# Patient Record
Sex: Female | Born: 1955 | Race: White | Hispanic: No | Marital: Married | Smoking: Former smoker
Health system: Southern US, Community
[De-identification: ages and names within clinical notes are randomized; demographics above are authoritative.]

## PROBLEM LIST (undated history)

## (undated) DIAGNOSIS — I10 Essential (primary) hypertension: Secondary | ICD-10-CM

## (undated) DIAGNOSIS — R059 Cough, unspecified: Secondary | ICD-10-CM

## (undated) DIAGNOSIS — J45909 Unspecified asthma, uncomplicated: Secondary | ICD-10-CM

## (undated) DIAGNOSIS — R0600 Dyspnea, unspecified: Secondary | ICD-10-CM

## (undated) DIAGNOSIS — C3490 Malignant neoplasm of unspecified part of unspecified bronchus or lung: Secondary | ICD-10-CM

## (undated) DIAGNOSIS — F419 Anxiety disorder, unspecified: Secondary | ICD-10-CM

## (undated) DIAGNOSIS — C801 Malignant (primary) neoplasm, unspecified: Secondary | ICD-10-CM

## (undated) DIAGNOSIS — K219 Gastro-esophageal reflux disease without esophagitis: Secondary | ICD-10-CM

## (undated) HISTORY — PX: THORACENTESIS: SHX235

## (undated) HISTORY — PX: BUNIONECTOMY: SHX129

## (undated) HISTORY — PX: COLONOSCOPY: SHX174

---

## 2004-08-13 ENCOUNTER — Ambulatory Visit: Payer: Self-pay | Admitting: Internal Medicine

## 2005-08-17 ENCOUNTER — Ambulatory Visit: Payer: Self-pay | Admitting: Internal Medicine

## 2006-03-17 ENCOUNTER — Ambulatory Visit: Payer: Self-pay | Admitting: Unknown Physician Specialty

## 2006-04-01 ENCOUNTER — Ambulatory Visit: Payer: Self-pay | Admitting: Unknown Physician Specialty

## 2006-04-01 HISTORY — PX: ESOPHAGOGASTRODUODENOSCOPY: SHX1529

## 2006-04-03 ENCOUNTER — Inpatient Hospital Stay: Payer: Self-pay | Admitting: Unknown Physician Specialty

## 2006-07-15 ENCOUNTER — Ambulatory Visit: Payer: Self-pay | Admitting: Unknown Physician Specialty

## 2007-04-18 ENCOUNTER — Ambulatory Visit: Payer: Self-pay | Admitting: Specialist

## 2007-08-25 ENCOUNTER — Ambulatory Visit: Payer: Self-pay | Admitting: Internal Medicine

## 2009-06-23 ENCOUNTER — Ambulatory Visit: Payer: Self-pay | Admitting: Internal Medicine

## 2009-06-25 ENCOUNTER — Ambulatory Visit: Payer: Self-pay | Admitting: Internal Medicine

## 2009-09-05 ENCOUNTER — Ambulatory Visit: Payer: Self-pay | Admitting: Unknown Physician Specialty

## 2010-05-12 ENCOUNTER — Ambulatory Visit: Payer: Self-pay | Admitting: Internal Medicine

## 2010-05-25 ENCOUNTER — Ambulatory Visit: Payer: Self-pay | Admitting: Internal Medicine

## 2011-08-19 ENCOUNTER — Ambulatory Visit: Payer: Self-pay | Admitting: Internal Medicine

## 2012-08-24 ENCOUNTER — Ambulatory Visit: Payer: Self-pay | Admitting: Internal Medicine

## 2013-08-28 ENCOUNTER — Ambulatory Visit: Payer: Self-pay | Admitting: Internal Medicine

## 2013-12-13 DIAGNOSIS — M519 Unspecified thoracic, thoracolumbar and lumbosacral intervertebral disc disorder: Secondary | ICD-10-CM | POA: Insufficient documentation

## 2014-08-29 DIAGNOSIS — J45909 Unspecified asthma, uncomplicated: Secondary | ICD-10-CM | POA: Insufficient documentation

## 2014-09-11 ENCOUNTER — Ambulatory Visit: Payer: Self-pay | Admitting: Internal Medicine

## 2015-09-02 ENCOUNTER — Other Ambulatory Visit: Payer: Self-pay | Admitting: Internal Medicine

## 2015-09-02 DIAGNOSIS — Z1231 Encounter for screening mammogram for malignant neoplasm of breast: Secondary | ICD-10-CM

## 2015-09-17 ENCOUNTER — Ambulatory Visit
Admission: RE | Admit: 2015-09-17 | Discharge: 2015-09-17 | Disposition: A | Discharge status: Home / Self Care | Payer: BLUE CROSS/BLUE SHIELD | Source: Ambulatory Visit | Attending: Internal Medicine | Admitting: Internal Medicine

## 2015-09-17 DIAGNOSIS — Z1231 Encounter for screening mammogram for malignant neoplasm of breast: Secondary | ICD-10-CM | POA: Diagnosis present

## 2016-07-05 DIAGNOSIS — I10 Essential (primary) hypertension: Secondary | ICD-10-CM | POA: Insufficient documentation

## 2016-09-07 DIAGNOSIS — E538 Deficiency of other specified B group vitamins: Secondary | ICD-10-CM | POA: Insufficient documentation

## 2017-06-03 ENCOUNTER — Other Ambulatory Visit: Payer: Self-pay | Admitting: Internal Medicine

## 2017-06-06 ENCOUNTER — Other Ambulatory Visit: Payer: Self-pay | Admitting: Internal Medicine

## 2017-06-06 DIAGNOSIS — Z1231 Encounter for screening mammogram for malignant neoplasm of breast: Secondary | ICD-10-CM

## 2017-06-16 ENCOUNTER — Ambulatory Visit
Admission: RE | Admit: 2017-06-16 | Discharge: 2017-06-16 | Disposition: A | Discharge status: Home / Self Care | Payer: BLUE CROSS/BLUE SHIELD | Source: Ambulatory Visit | Attending: Internal Medicine | Admitting: Internal Medicine

## 2017-06-16 DIAGNOSIS — Z1231 Encounter for screening mammogram for malignant neoplasm of breast: Secondary | ICD-10-CM | POA: Insufficient documentation

## 2018-06-13 ENCOUNTER — Other Ambulatory Visit: Payer: Self-pay | Admitting: Internal Medicine

## 2018-06-13 DIAGNOSIS — Z1231 Encounter for screening mammogram for malignant neoplasm of breast: Secondary | ICD-10-CM

## 2018-06-21 ENCOUNTER — Ambulatory Visit
Admission: RE | Admit: 2018-06-21 | Discharge: 2018-06-21 | Disposition: A | Discharge status: Home / Self Care | Payer: BLUE CROSS/BLUE SHIELD | Source: Ambulatory Visit | Attending: Internal Medicine | Admitting: Internal Medicine

## 2018-06-21 DIAGNOSIS — Z1231 Encounter for screening mammogram for malignant neoplasm of breast: Secondary | ICD-10-CM | POA: Diagnosis present

## 2018-09-11 DIAGNOSIS — Z800 Family history of malignant neoplasm of digestive organs: Secondary | ICD-10-CM | POA: Insufficient documentation

## 2018-09-11 DIAGNOSIS — D369 Benign neoplasm, unspecified site: Secondary | ICD-10-CM | POA: Insufficient documentation

## 2019-05-22 ENCOUNTER — Other Ambulatory Visit: Payer: Self-pay | Admitting: Internal Medicine

## 2019-05-22 DIAGNOSIS — Z1231 Encounter for screening mammogram for malignant neoplasm of breast: Secondary | ICD-10-CM

## 2019-06-25 ENCOUNTER — Ambulatory Visit
Admission: RE | Admit: 2019-06-25 | Discharge: 2019-06-25 | Disposition: A | Discharge status: Home / Self Care | Payer: BC Managed Care – PPO | Source: Ambulatory Visit | Attending: Internal Medicine | Admitting: Internal Medicine

## 2019-06-25 DIAGNOSIS — Z1231 Encounter for screening mammogram for malignant neoplasm of breast: Secondary | ICD-10-CM | POA: Diagnosis present

## 2019-10-18 DIAGNOSIS — K219 Gastro-esophageal reflux disease without esophagitis: Secondary | ICD-10-CM | POA: Insufficient documentation

## 2020-01-16 DIAGNOSIS — E871 Hypo-osmolality and hyponatremia: Secondary | ICD-10-CM | POA: Insufficient documentation

## 2020-06-16 ENCOUNTER — Other Ambulatory Visit: Payer: Self-pay | Admitting: Internal Medicine

## 2020-09-17 ENCOUNTER — Other Ambulatory Visit: Payer: Self-pay | Admitting: Internal Medicine

## 2020-09-17 ENCOUNTER — Other Ambulatory Visit (HOSPITAL_COMMUNITY): Payer: Self-pay | Admitting: Internal Medicine

## 2020-09-17 DIAGNOSIS — R918 Other nonspecific abnormal finding of lung field: Secondary | ICD-10-CM

## 2020-10-02 ENCOUNTER — Other Ambulatory Visit: Payer: Self-pay

## 2020-10-02 ENCOUNTER — Ambulatory Visit
Admission: RE | Admit: 2020-10-02 | Discharge: 2020-10-02 | Disposition: A | Discharge status: Home / Self Care | Payer: BC Managed Care – PPO | Source: Ambulatory Visit | Attending: Internal Medicine | Admitting: Internal Medicine

## 2020-10-02 ENCOUNTER — Ambulatory Visit: Payer: BC Managed Care – PPO

## 2020-10-02 DIAGNOSIS — R918 Other nonspecific abnormal finding of lung field: Secondary | ICD-10-CM | POA: Diagnosis present

## 2020-10-02 HISTORY — DX: Essential (primary) hypertension: I10

## 2020-10-02 HISTORY — DX: Unspecified asthma, uncomplicated: J45.909

## 2020-10-02 MED ORDER — IOHEXOL 300 MG/ML  SOLN
75.0000 mL | Freq: Once | INTRAMUSCULAR | Status: AC | PRN
  Administered 2020-10-02: 75 mL via INTRAVENOUS

## 2020-10-03 ENCOUNTER — Other Ambulatory Visit: Payer: Self-pay | Admitting: Physician Assistant

## 2020-10-03 DIAGNOSIS — R918 Other nonspecific abnormal finding of lung field: Secondary | ICD-10-CM

## 2020-10-07 LAB — PULMONARY FUNCTION TEST

## 2020-10-16 ENCOUNTER — Ambulatory Visit
Admission: RE | Admit: 2020-10-16 | Discharge: 2020-10-16 | Disposition: A | Discharge status: Home / Self Care | Payer: BC Managed Care – PPO | Source: Ambulatory Visit | Attending: Physician Assistant | Admitting: Physician Assistant

## 2020-10-16 ENCOUNTER — Other Ambulatory Visit: Payer: Self-pay

## 2020-10-16 DIAGNOSIS — R918 Other nonspecific abnormal finding of lung field: Secondary | ICD-10-CM

## 2020-10-16 LAB — GLUCOSE, CAPILLARY: Glucose-Capillary: 86 mg/dL (ref 70–99)

## 2020-10-16 MED ORDER — FLUDEOXYGLUCOSE F - 18 (FDG) INJECTION
8.3000 | Freq: Once | INTRAVENOUS | Status: AC | PRN
  Administered 2020-10-16: 8.68 via INTRAVENOUS

## 2020-10-28 LAB — PULMONARY FUNCTION TEST

## 2020-10-31 ENCOUNTER — Inpatient Hospital Stay: Admission: RE | Admit: 2020-10-31 | Payer: BC Managed Care – PPO | Source: Ambulatory Visit

## 2020-11-04 ENCOUNTER — Other Ambulatory Visit: Payer: Self-pay | Admitting: Pulmonary Disease

## 2020-11-04 DIAGNOSIS — R918 Other nonspecific abnormal finding of lung field: Secondary | ICD-10-CM

## 2020-11-05 ENCOUNTER — Ambulatory Visit
Admission: RE | Admit: 2020-11-05 | Discharge: 2020-11-05 | Disposition: A | Discharge status: Home / Self Care | Payer: BC Managed Care – PPO | Source: Ambulatory Visit | Attending: Pulmonary Disease | Admitting: Pulmonary Disease

## 2020-11-05 ENCOUNTER — Other Ambulatory Visit: Payer: Self-pay

## 2020-11-05 ENCOUNTER — Other Ambulatory Visit
Admission: RE | Admit: 2020-11-05 | Discharge: 2020-11-05 | Disposition: A | Discharge status: Home / Self Care | Payer: BC Managed Care – PPO | Source: Ambulatory Visit | Attending: Pulmonary Disease | Admitting: Pulmonary Disease

## 2020-11-05 DIAGNOSIS — Z01812 Encounter for preprocedural laboratory examination: Secondary | ICD-10-CM | POA: Insufficient documentation

## 2020-11-05 DIAGNOSIS — Z87891 Personal history of nicotine dependence: Secondary | ICD-10-CM | POA: Diagnosis not present

## 2020-11-05 DIAGNOSIS — Z20822 Contact with and (suspected) exposure to covid-19: Secondary | ICD-10-CM | POA: Insufficient documentation

## 2020-11-05 DIAGNOSIS — Z79899 Other long term (current) drug therapy: Secondary | ICD-10-CM | POA: Diagnosis not present

## 2020-11-05 DIAGNOSIS — R918 Other nonspecific abnormal finding of lung field: Secondary | ICD-10-CM | POA: Diagnosis not present

## 2020-11-05 DIAGNOSIS — R590 Localized enlarged lymph nodes: Secondary | ICD-10-CM | POA: Diagnosis not present

## 2020-11-05 DIAGNOSIS — C3431 Malignant neoplasm of lower lobe, right bronchus or lung: Secondary | ICD-10-CM | POA: Diagnosis not present

## 2020-11-06 ENCOUNTER — Encounter
Admission: RE | Admit: 2020-11-06 | Discharge: 2020-11-06 | Disposition: A | Discharge status: Home / Self Care | Payer: BC Managed Care – PPO | Source: Ambulatory Visit | Attending: Pulmonary Disease | Admitting: Pulmonary Disease

## 2020-11-06 HISTORY — DX: Gastro-esophageal reflux disease without esophagitis: K21.9

## 2020-11-06 HISTORY — DX: Cough, unspecified: R05.9

## 2020-11-06 HISTORY — DX: Anxiety disorder, unspecified: F41.9

## 2020-11-06 HISTORY — DX: Dyspnea, unspecified: R06.00

## 2020-11-06 LAB — SARS CORONAVIRUS 2 (TAT 6-24 HRS): SARS Coronavirus 2: NEGATIVE

## 2020-11-06 NOTE — Patient Instructions (Signed)
Your procedure is scheduled on:11-07-20 FRIDAY Report to the Registration Desk on the 1st floor of the [facility]-Then proceed to the 2nd floor Surgery Desk in the [place] To find out your arrival time, please call [phone] between 1PM - 3PM on:11-06-20 THURSDAY  REMEMBER: Instructions that are not followed completely may result in serious medical risk, up to and including death; or upon the discretion of your surgeon and anesthesiologist your surgery may need to be rescheduled.  Do not eat food after midnight the night before surgery.  No gum chewing, lozengers or hard candies.  You may however, drink CLEAR liquids up to 2 hours before you are scheduled to arrive for your surgery. Do not drink anything within 2 hours of your scheduled arrival time.  Clear liquids include: - water  - apple juice without pulp - gatorade (not RED, PURPLE, OR BLUE) - black coffee or tea (Do NOT add milk or creamers to the coffee or tea) Do NOT drink anything that is not on this list.  TAKE THESE MEDICATIONS THE MORNING OF SURGERY WITH A SIP OF WATER: -METOPROLOL (TOPROL) -NEXIUM (ESOMEPRAZOLE)-take one the night before and one on the morning of surgery - helps to prevent nausea after surgery.) -YOU MAY TAKE XANAX IF NEEDED THE DAY OF SURGERY  One week prior to surgery: Stop Anti-inflammatories (NSAIDS) such as Advil, Aleve, Ibuprofen, Motrin, Naproxen, Naprosyn and Aspirin based products such as Excedrin, Goodys Powder, BC Powder-OK TO TAKE TYLENOL IF NEEDED  No Alcohol for 24 hours before or after surgery.  No Smoking including e-cigarettes for 24 hours prior to surgery.  No chewable tobacco products for at least 6 hours prior to surgery.  No nicotine patches on the day of surgery.  Do not use any "recreational" drugs for at least a week prior to your surgery.  Please be advised that the combination of cocaine and anesthesia may have negative outcomes, up to and including death. If you test  positive for cocaine, your surgery will be cancelled.  On the morning of surgery brush your teeth with toothpaste and water, you may rinse your mouth with mouthwash if you wish. Do not swallow any toothpaste or mouthwash.  Do not wear jewelry, make-up, hairpins, clips or nail polish.  Do not wear lotions, powders, or perfumes.   Do not shave body from the neck down 48 hours prior to surgery just in case you cut yourself which could leave a site for infection.  Also, freshly shaved skin may become irritated if using the CHG soap.  Contact lenses, hearing aids and dentures may not be worn into surgery.  Do not bring valuables to the hospital. [facility] is not responsible for any missing/lost belongings or valuables.   Notify your doctor if there is any change in your medical condition (cold, fever, infection).  Wear comfortable clothing (specific to your surgery type) to the hospital.  Plan for stool softeners for home use; pain medications have a tendency to cause constipation. You can also help prevent constipation by eating foods high in fiber such as fruits and vegetables and drinking plenty of fluids as your diet allows.  After surgery, you can help prevent lung complications by doing breathing exercises.  Take deep breaths and cough every 1-2 hours. Your doctor may order a device called an Incentive Spirometer to help you take deep breaths. When coughing or sneezing, hold a pillow firmly against your incision with both hands. This is called "splinting." Doing this helps protect your  incision. It also decreases belly discomfort.  If you are being admitted to the hospital overnight, leave your suitcase in the car. After surgery it may be brought to your room.  If you are being discharged the day of surgery, you will not be allowed to drive home. You will need a responsible adult (18 years or older) to drive you home and stay with you that night.   If you are taking public  transportation, you will need to have a responsible adult (18 years or older) with you. Please confirm with your physician that it is acceptable to use public transportation.   Please call the [facility] at [phone] if you have any questions about these instructions.  Surgery Visitation Policy:  Patients undergoing a surgery or procedure may have one family member or support person with them as long as that person is not COVID-19 positive or experiencing its symptoms.  That person may remain in the waiting area during the procedure.  Inpatient Visitation:    Visiting hours are 7 a.m. to 8 p.m. Inpatients will be allowed two visitors daily. The visitors may change each day during the patient's stay. No visitors under the age of 14. Any visitor under the age of 12 must be accompanied by an adult. The visitor must pass COVID-19 screenings, use hand sanitizer when entering and exiting the patient's room and wear a mask at all times, including in the patient's room. Patients must also wear a mask when staff or their visitor are in the room. Masking is required regardless of vaccination status.

## 2020-11-07 ENCOUNTER — Ambulatory Visit: Payer: BC Managed Care – PPO | Admitting: Certified Registered Nurse Anesthetist

## 2020-11-07 ENCOUNTER — Ambulatory Visit: Payer: BC Managed Care – PPO

## 2020-11-07 ENCOUNTER — Encounter
Admission: RE | Disposition: A | Discharge status: Home / Self Care | Payer: Self-pay | Source: Home / Self Care | Attending: Pulmonary Disease

## 2020-11-07 ENCOUNTER — Ambulatory Visit
Admission: RE | Admit: 2020-11-07 | Discharge: 2020-11-07 | Disposition: A | Discharge status: Home / Self Care | Payer: BC Managed Care – PPO | Attending: Pulmonary Disease | Admitting: Pulmonary Disease

## 2020-11-07 DIAGNOSIS — R590 Localized enlarged lymph nodes: Secondary | ICD-10-CM | POA: Insufficient documentation

## 2020-11-07 DIAGNOSIS — Z79899 Other long term (current) drug therapy: Secondary | ICD-10-CM | POA: Insufficient documentation

## 2020-11-07 DIAGNOSIS — C3431 Malignant neoplasm of lower lobe, right bronchus or lung: Secondary | ICD-10-CM | POA: Diagnosis not present

## 2020-11-07 DIAGNOSIS — Z87891 Personal history of nicotine dependence: Secondary | ICD-10-CM | POA: Insufficient documentation

## 2020-11-07 DIAGNOSIS — Z20822 Contact with and (suspected) exposure to covid-19: Secondary | ICD-10-CM | POA: Insufficient documentation

## 2020-11-07 HISTORY — PX: VIDEO BRONCHOSCOPY WITH ENDOBRONCHIAL NAVIGATION: SHX6175

## 2020-11-07 HISTORY — PX: VIDEO BRONCHOSCOPY WITH ENDOBRONCHIAL ULTRASOUND: SHX6177

## 2020-11-07 LAB — PROTIME-INR
INR: 1 (ref 0.8–1.2)
Prothrombin Time: 12.4 seconds (ref 11.4–15.2)

## 2020-11-07 LAB — CBC
HCT: 34.7 % — ABNORMAL LOW (ref 36.0–46.0)
Hemoglobin: 11.6 g/dL — ABNORMAL LOW (ref 12.0–15.0)
MCH: 32.4 pg (ref 26.0–34.0)
MCHC: 33.4 g/dL (ref 30.0–36.0)
MCV: 96.9 fL (ref 80.0–100.0)
Platelets: 295 10*3/uL (ref 150–400)
RBC: 3.58 MIL/uL — ABNORMAL LOW (ref 3.87–5.11)
RDW: 12.9 % (ref 11.5–15.5)
WBC: 7 10*3/uL (ref 4.0–10.5)
nRBC: 0 % (ref 0.0–0.2)

## 2020-11-07 LAB — APTT: aPTT: 29 seconds (ref 24–36)

## 2020-11-07 SURGERY — BRONCHOSCOPY, WITH EBUS
Anesthesia: General

## 2020-11-07 MED ORDER — LIDOCAINE HCL (PF) 2 % IJ SOLN
INTRAMUSCULAR | Status: AC
  Filled 2020-11-07: qty 5

## 2020-11-07 MED ORDER — MEPERIDINE HCL 50 MG/ML IJ SOLN: 6.2500 mg | INTRAMUSCULAR | Status: DC | PRN

## 2020-11-07 MED ORDER — FENTANYL CITRATE (PF) 100 MCG/2ML IJ SOLN: 25.0000 ug | INTRAMUSCULAR | Status: DC | PRN

## 2020-11-07 MED ORDER — LIDOCAINE HCL (CARDIAC) PF 100 MG/5ML IV SOSY
PREFILLED_SYRINGE | INTRAVENOUS | Status: DC | PRN
  Administered 2020-11-07: 100 mg via INTRAVENOUS

## 2020-11-07 MED ORDER — DEXAMETHASONE SODIUM PHOSPHATE 10 MG/ML IJ SOLN
INTRAMUSCULAR | Status: AC
  Filled 2020-11-07: qty 1

## 2020-11-07 MED ORDER — PHENYLEPHRINE HCL (PRESSORS) 10 MG/ML IV SOLN
INTRAVENOUS | Status: DC | PRN
Start: 2020-11-07 — End: 2020-11-10
  Administered 2020-11-07: 100 ug via INTRAVENOUS
  Administered 2020-11-07: 150 ug via INTRAVENOUS
  Administered 2020-11-07: 100 ug via INTRAVENOUS
  Administered 2020-11-07: 150 ug via INTRAVENOUS
  Administered 2020-11-07: 100 ug via INTRAVENOUS
  Administered 2020-11-07: 150 ug via INTRAVENOUS
  Administered 2020-11-07 (×2): 100 ug via INTRAVENOUS

## 2020-11-07 MED ORDER — OXYCODONE HCL 5 MG PO TABS: 5.0000 mg | ORAL_TABLET | Freq: Once | ORAL | Status: DC | PRN

## 2020-11-07 MED ORDER — FENTANYL CITRATE (PF) 100 MCG/2ML IJ SOLN
INTRAMUSCULAR | Status: AC
  Filled 2020-11-07: qty 2

## 2020-11-07 MED ORDER — OXYCODONE HCL 5 MG/5ML PO SOLN: 5.0000 mg | Freq: Once | ORAL | Status: DC | PRN

## 2020-11-07 MED ORDER — BUTAMBEN-TETRACAINE-BENZOCAINE 2-2-14 % EX AERO
1.0000 | INHALATION_SPRAY | Freq: Once | CUTANEOUS | Status: DC
  Filled 2020-11-07: qty 20

## 2020-11-07 MED ORDER — CHLORHEXIDINE GLUCONATE 0.12 % MT SOLN
OROMUCOSAL | Status: AC
  Filled 2020-11-07: qty 15

## 2020-11-07 MED ORDER — ONDANSETRON HCL 4 MG/2ML IJ SOLN
INTRAMUSCULAR | Status: DC | PRN
  Administered 2020-11-07: 4 mg via INTRAVENOUS

## 2020-11-07 MED ORDER — ONDANSETRON HCL 4 MG/2ML IJ SOLN
INTRAMUSCULAR | Status: AC
  Filled 2020-11-07: qty 2

## 2020-11-07 MED ORDER — LIDOCAINE HCL (PF) 1 % IJ SOLN
30.0000 mL | Freq: Once | INTRAMUSCULAR | Status: DC
  Filled 2020-11-07: qty 30

## 2020-11-07 MED ORDER — PHENYLEPHRINE HCL 0.25 % NA SOLN
1.0000 | Freq: Four times a day (QID) | NASAL | Status: DC | PRN
  Filled 2020-11-07: qty 15

## 2020-11-07 MED ORDER — MIDAZOLAM HCL 2 MG/2ML IJ SOLN
INTRAMUSCULAR | Status: AC
  Filled 2020-11-07: qty 2

## 2020-11-07 MED ORDER — ROCURONIUM BROMIDE 100 MG/10ML IV SOLN
INTRAVENOUS | Status: DC | PRN
  Administered 2020-11-07: 40 mg via INTRAVENOUS
  Administered 2020-11-07 (×2): 10 mg via INTRAVENOUS

## 2020-11-07 MED ORDER — ORAL CARE MOUTH RINSE: 15.0000 mL | Freq: Once | OROMUCOSAL | Status: AC

## 2020-11-07 MED ORDER — LACTATED RINGERS IV SOLN: INTRAVENOUS | Status: DC

## 2020-11-07 MED ORDER — PROPOFOL 10 MG/ML IV BOLUS
INTRAVENOUS | Status: AC
  Filled 2020-11-07: qty 20

## 2020-11-07 MED ORDER — CHLORHEXIDINE GLUCONATE 0.12 % MT SOLN
15.0000 mL | Freq: Once | OROMUCOSAL | Status: AC
  Administered 2020-11-07: 15 mL via OROMUCOSAL

## 2020-11-07 MED ORDER — FENTANYL CITRATE (PF) 100 MCG/2ML IJ SOLN
INTRAMUSCULAR | Status: DC | PRN
  Administered 2020-11-07 (×4): 50 ug via INTRAVENOUS

## 2020-11-07 MED ORDER — MIDAZOLAM HCL 2 MG/2ML IJ SOLN
INTRAMUSCULAR | Status: DC | PRN
  Administered 2020-11-07: 2 mg via INTRAVENOUS

## 2020-11-07 MED ORDER — PROMETHAZINE HCL 25 MG/ML IJ SOLN: 6.2500 mg | INTRAMUSCULAR | Status: DC | PRN

## 2020-11-07 MED ORDER — PROPOFOL 10 MG/ML IV BOLUS
INTRAVENOUS | Status: DC | PRN
  Administered 2020-11-07: 60 mg via INTRAVENOUS
  Administered 2020-11-07: 140 mg via INTRAVENOUS

## 2020-11-07 MED ORDER — LIDOCAINE HCL URETHRAL/MUCOSAL 2 % EX GEL
1.0000 "application " | Freq: Once | CUTANEOUS | Status: DC
  Filled 2020-11-07: qty 5

## 2020-11-07 MED ORDER — DEXAMETHASONE SODIUM PHOSPHATE 10 MG/ML IJ SOLN
INTRAMUSCULAR | Status: DC | PRN
  Administered 2020-11-07: 10 mg via INTRAVENOUS

## 2020-11-07 MED ORDER — SUGAMMADEX SODIUM 200 MG/2ML IV SOLN
INTRAVENOUS | Status: DC | PRN
  Administered 2020-11-07: 145.2 mg via INTRAVENOUS

## 2020-11-07 MED ORDER — ROCURONIUM BROMIDE 10 MG/ML (PF) SYRINGE
PREFILLED_SYRINGE | INTRAVENOUS | Status: AC
  Filled 2020-11-07: qty 10

## 2020-11-07 NOTE — Discharge Instructions (Addendum)
Flexible Bronchoscopy  Flexible bronchoscopy is a procedure used to examine the passageways in the lungs. During the procedure, a thin, flexible tool with a camera (bronchoscope) is passed into the mouth or nose, down through the windpipe (trachea), and into the air tubes in the lungs (bronchi). This tool allows the health care provider to look inside the lungs and to take samples for testing, if needed. Tell a health care provider about:  Any allergies you have.  All medicines you are taking, including vitamins, herbs, eye drops, creams, and over-the-counter medicines.  Any problems you or family members have had with anesthetic medicines.  Any blood disorders you have.  Any surgeries you have had.  Any medical conditions you have.  Whether you are pregnant or may be pregnant. What are the risks? Generally, this is a safe procedure. However, problems may occur, including:  Infection.  Bleeding.  Damage to other structures or organs.  Allergic reactions to medicines.  Collapsed lung (pneumothorax).  Increased need for oxygen or difficulty breathing after the procedure. What happens before the procedure? Staying hydrated Follow instructions from your health care provider about hydration, which may include:  Up to 2 hours before the procedure - you may continue to drink clear liquids, such as water, clear fruit juice, black coffee, and plain tea.   Eating and drinking restrictions Follow instructions from your health care provider about eating and drinking, which may include:  8 hours before the procedure - stop eating heavy meals or foods, such as meat, fried foods, or fatty foods.  6 hours before the procedure - stop eating light meals or foods, such as toast or cereal.  6 hours before the procedure - stop drinking milk or drinks that contain milk.  2 hours before the procedure - stop drinking clear liquids. Medicines Ask your health care provider about:  Changing or  stopping your regular medicines. This is especially important if you are taking diabetes medicines or blood thinners.  Taking medicines such as aspirin and ibuprofen. These medicines can thin your blood. Do not take these medicines unless your health care provider tells you to take them.  Taking over-the-counter medicines, vitamins, herbs, and supplements. General instructions  You may be given antibiotic medicine to help lower the risk of infection.  Plan to have a responsible adult take you home from the hospital or clinic.  If you will be going home right after the procedure, plan to have a responsible adult care for you for the time you are told. This is important. What happens during the procedure?  An IV will be inserted into one of your veins.  You will be given a medicine (local anesthetic) to numb your mouth, nose, throat, and voice box (larynx). You may also be given one or more of the following: ? A medicine to help you relax (sedative). ? A medicine to control coughing. ? A medicine to dry up any fluids or secretions in your lungs.  A bronchoscope will be passed into your nose or mouth, and into your lungs. Your health care provider will examine your lungs.  Samples of airway secretions may be collected for testing.  If abnormal areas are seen in your airways, samples of tissue may be removed and checked under a microscope (biopsy).  If tissue samples are needed from the outer parts of the lung, a type of X-ray (fluoroscopy) may be used to guide the bronchoscope to these areas.  If bleeding occurs, you may be given medicine to  stop or decrease the bleeding. The procedure may vary among health care providers and hospitals. What can I expect after the procedure?  Your blood pressure, heart rate, breathing rate, and blood oxygen level will be monitored until you leave the hospital or clinic.  You may have a chest X-ray to check for signs of pneumothorax.  You willnot be  allowed to eat or drink anything for 2 hours after your procedure.  If a biopsy was taken, it is up to you to get the results of the test. Ask your health care provider, or the department that is doing the procedure, when your results will be ready.  You may have the following symptoms for 24-48 hours: ? A cough that is worse than it was before the procedure. ? A low-grade fever. ? A sore throat or hoarse voice. ? Some blood in the mucus from your lungs (sputum), if a biopsy was done. Follow these instructions at home: Eating and drinking  Do not eat or drink anything, including water, for 2 hours after your procedure, or until your numbing medicine has worn off. Having a numb throat increases your risk of burning yourself or choking.  Start eating soft foods and slowly drinking liquids after your numbness is gone and your cough and gag reflexes have returned.  You may return to your normal diet the day after the procedure. Driving  If you were given a sedative during the procedure, it can affect you for several hours. Do not drive or operate machinery until your health care provider says that it is safe.  Ask your health care provider if the medicine prescribed to you requires you to avoid driving or using machinery.  Return to your normal activities as told by your health care provider. Ask your health care provider what activities are safe for you. General instructions  Take over-the-counter and prescription medicines only as told by your health care provider.  Do not use any products that contain nicotine or tobacco. These products include cigarettes, chewing tobacco, and vaping devices, such as e-cigarettes. If you need help quitting, ask your health care provider.  Keep all follow-up visits. This is important.   Get help right away if:  You have shortness of breath that gets worse.  You become light-headed or feel like you might faint.  You have chest pain.  You cough up  more than a small amount of blood. These symptoms may represent a serious problem that is an emergency. Do not wait to see if the symptoms will go away. Get medical help right away. Call your local emergency services (911 in the [place] Do not drive yourself to the hospital. Summary  Flexible bronchoscopy is a procedure that allows your health care provider to look closely inside your lungs and to take testing samples if needed.  Risks of flexible bronchoscopy include bleeding, infection, and collapsed lung (pneumothorax).  Before the procedure, you will be given a medicine to numb your mouth, nose, throat, and voice box. Then, a bronchoscope will be passed into your nose or mouth, and into your lungs.  After the procedure, your blood pressure, heart rate, breathing rate, and blood oxygen level will be monitored until you leave the hospital or clinic. You may have a chest X-ray to check for signs of pneumothorax.  You will not be allowed to eat or drink anything for 2 hours after your procedure. This information is not intended to replace advice given to you by your health care provider.  Make sure you discuss any questions you have with your health care provider. Document Revised: 02/07/2020 Document Reviewed: 02/07/2020 Elsevier Patient Education  2021 [place]   1) The drugs that you were given will stay in your system until tomorrow so for the next 24 hours you should not:  A) Drive an automobile B) Make any legal decisions C) Drink any alcoholic beverage   2) You may resume regular meals tomorrow.  Today it is better to start with liquids and gradually work up to solid foods.  You may eat anything you prefer, but it is better to start with liquids, then soup and crackers, and gradually work up to solid foods.   3) Please notify your doctor immediately if you have any unusual bleeding, trouble breathing, redness and pain at  the surgery site, drainage, fever, or pain not relieved by medication.  4) Your post-operative visit with Dr.                                     is: Date:                        Time:    Please call to schedule your post-operative visit.  5) Additional Instructions:  You may resume your daily medications tomorrow.

## 2020-11-07 NOTE — H&P (Signed)
Pulmonary Medicine          Date: 11/07/2020,   MRN# [id number] Katherine Perry 04-Jun-1956     AdmissionWeight: 72.6 kg                 CurrentWeight: 72.6 kg     CHIEF COMPLAINT:   Right upper lobe lung mass with hilar and mediastinal lymphadenopathy.   HISTORY OF PRESENT ILLNESS   Pleasant 65 year old patient with a history of asthma, dyspnea, reflux with GERD, chronic cough and anxiety disorder, she initially came in with worsening cough for evaluation in pulmonary clinic.  She had CT chest done which showed lung consolidation of the right upper and middle lobe.  She had interval repeat CT chest with similar findings.  She has smoked in the past from age [age].  CT chest shows right upper lobe consolidated mass with hilar lymphadenopathy and mediastinal lymphadenopathy suggestive of possible malignant or infectious etiology.  Patient presents today for bronchoscopy with airway inspection as well as navigational bronchoscopy and lymph node biopsies via EBUS.  She denies any new symptoms.   PAST MEDICAL HISTORY   Past Medical History:  Diagnosis Date  . Anxiety   . Asthma   . Cough   . Dyspnea    with exertion  . GERD (gastroesophageal reflux disease)   . Hypertension      SURGICAL HISTORY   Past Surgical History:  Procedure Laterality Date  . BUNIONECTOMY Bilateral   . COLONOSCOPY       FAMILY HISTORY   History reviewed. No pertinent family history.   SOCIAL HISTORY   Social History   Tobacco Use  . Smoking status: Former Smoker    Packs/day: 1.00    Years: 15.00    Pack years: 15.00    Types: Cigarettes    Quit date: 11/07/1994    Years since quitting: 26.0  . Smokeless tobacco: Never Used  Vaping Use  . Vaping Use: Never used  Substance Use Topics  . Alcohol use: Yes    Comment:  wine daily  . Drug use: Never     MEDICATIONS    Home Medication:    Current Medication:  Current Facility-Administered Medications:  .   chlorhexidine (PERIDEX) 0.12 % solution, , , ,  .  lactated ringers infusion, , Intravenous, Continuous, Piscitello, Precious Haws, MD, Last Rate: 10 mL/hr at 11/07/20 1118, New Bag at 11/07/20 1118    ALLERGIES   Patient has no known allergies.     REVIEW OF SYSTEMS    Review of Systems:  Gen:  Denies  fever, sweats, chills weigh loss  HEENT: Denies blurred vision, double vision, ear pain, eye pain, hearing loss, nose bleeds, sore throat Cardiac:  No dizziness, chest pain or heaviness, chest tightness,edema Resp:   Denies cough or sputum porduction, shortness of breath,wheezing, hemoptysis,  Gi: Denies swallowing difficulty, stomach pain, nausea or vomiting, diarrhea, constipation, bowel incontinence Gu:  Denies bladder incontinence, burning urine Ext:   Denies Joint pain, stiffness or swelling Skin: Denies  skin rash, easy bruising or bleeding or hives Endoc:  Denies polyuria, polydipsia , polyphagia or weight change Psych:   Denies depression, insomnia or hallucinations   Other:  All other systems negative   VS: BP (!) 156/78   Pulse 89   Temp (!) 97.3 F (36.3 C) (Temporal)   Resp 12   Ht 5\' 5"  (1.651 m)   Wt 72.6 kg   SpO2 100%   BMI 26.63  kg/m      PHYSICAL EXAM    GENERAL:NAD, no fevers, chills, no weakness no fatigue HEAD: Normocephalic, atraumatic.  EYES: Pupils equal, round, reactive to light. Extraocular muscles intact. No scleral icterus.  MOUTH: Moist mucosal membrane. Dentition intact. No abscess noted.  EAR, NOSE, THROAT: Clear without exudates. No external lesions.  NECK: Supple. No thyromegaly. No nodules. No JVD.  PULMONARY: Diffuse coarse rhonchi right sided +wheezes CARDIOVASCULAR: S1 and S2. Regular rate and rhythm. No murmurs, rubs, or gallops. No edema. Pedal pulses 2+ bilaterally.  GASTROINTESTINAL: Soft, nontender, nondistended. No masses. Positive bowel sounds. No hepatosplenomegaly.  MUSCULOSKELETAL: No swelling, clubbing, or edema. Range  of motion full in all extremities.  NEUROLOGIC: Cranial nerves II through XII are intact. No gross focal neurological deficits. Sensation intact. Reflexes intact.  SKIN: No ulceration, lesions, rashes, or cyanosis. Skin warm and dry. Turgor intact.  PSYCHIATRIC: Mood, affect within normal limits. The patient is awake, alert and oriented x 3. Insight, judgment intact.       IMAGING    NM PET Image Initial (PI) Skull Base To Thigh  Result Date: 10/16/2020 CLINICAL DATA:  Initial treatment strategy for RIGHT lower lobe lung mass. EXAM: NUCLEAR MEDICINE PET SKULL BASE TO THIGH TECHNIQUE: 8.7 mCi F-18 FDG was injected intravenously. Full-ring PET imaging was performed from the skull base to thigh after the radiotracer. CT data was obtained and used for attenuation correction and anatomic localization. Fasting blood glucose: 86 mg/dl COMPARISON:  CT 10/02/2020 FINDINGS: Mediastinal blood pool activity: SUV max 2.7 Liver activity: SUV max NA NECK: Consolidation within the medial aspect of the RIGHT lower lobe extending from the hilum the lung base is similar morphology in short interval follow-up measuring 3.4 by 4.0 cm in axial dimension compared to 3.3 by 4.1 cm on prior. This consolidation is intensely hypermetabolic with SUV max equal 11.6. There is hypermetabolic masslike thickening at the RIGHT hilum measuring 2.2 cm (image 94) also with intense metabolic activity. The ground-glass airspace disease/consolidation in the posterior aspect of the RIGHT upper lobe is unchanged morphology and also has intense metabolic activity SUV max equal 11.4. No hypermetabolic mediastinal lymph nodes. No hypermetabolic supraclavicular nodes. Incidental CT findings: none CHEST: Incidental CT findings: none ABDOMEN/PELVIS: No abnormal hypermetabolic activity within the liver, pancreas, adrenal glands, or spleen. No hypermetabolic lymph nodes in the abdomen or pelvis. Incidental CT findings: None SKELETON: No focal  hypermetabolic activity to suggest skeletal metastasis. Incidental CT findings: none IMPRESSION: 1. Hypermetabolic consolidative mass in the medial aspect of the RIGHT lower lobe extending from the hilum to the lung base. Hypermetabolic LEFT hilar mass. Findings remain highly concerning for bronchogenic carcinoma although pulmonary infection could have similar pattern. No significant change from CT 10/02/2020. 2. Band intensely hypermetabolic ground-glass density in the RIGHT upper lobe with differential including pulmonary infection versus lymphangitic spread of carcinoma. 3. Consider bronchoscopy for evaluation of the RIGHT lower lobe pulmonary mass. 4. No hypermetabolic mediastinal lymph nodes or distant malignancy. Electronically Signed   By: Suzy Bouchard M.D.   On: 10/16/2020 14:33   CT Super D Chest Wo Contrast  Result Date: 11/06/2020 CLINICAL DATA:  Preparation for navigational bronchoscopy. EXAM: CT CHEST WITHOUT CONTRAST TECHNIQUE: Multidetector CT imaging of the chest was performed using thin slice collimation for electromagnetic bronchoscopy planning purposes, without intravenous contrast. COMPARISON:  October 02, 2020 and prior PET scan from October 16, 2020 FINDINGS: Cardiovascular: Calcified atheromatous plaque the thoracic aorta. Nonaneurysmal appearance of the thoracic aorta. Normal heart size.  Mass/abnormal soft tissue abutting the posterior aspect of the RIGHT border of the heart, posterior LEFT atrium to the RIGHT of midline. Central pulmonary vasculature is normal caliber. Mediastinum/Nodes: Esophagus patulous and inseparable from the masslike area in the medial RIGHT chest on image 90 of series 2. Subcarinal nodal enlargement with similar appearance to recent chest CT, largest approximately 10 mm. RIGHT paratracheal lymph nodes with mild enlargement similar to prior imaging. Small anterior mediastinal lymph node on image 55 of series 2 unchanged is 6 mm. No supraclavicular adenopathy. No  axillary adenopathy. Lungs/Pleura: Large masslike area in the medial RIGHT chest and RIGHT hilar mass with associated distortion of the major fissure, dominant area centered in the superior segment of the RIGHT lower lobe but also involving RIGHT hilum and with extension of ground-glass, bronchiectasis and septal thickening with interstitial thickening into the RIGHT upper lobe and into the RIGHT lower lobe as on previous imaging. Dominant area in axial dimension measuring 6.6 by 3.2 cm as compared to 7.2 x 2.7 cm. Just below the RIGHT hilum the area measures approximately 5.1 x 5.0 cm, little changed compared to previous imaging Upper lobe findings may be slightly smaller and or less confluent measuring approximately 9 by 4.8 cm as compared to 8.7 x 5.9 cm on the previous exam LEFT lung is clear. Upper Abdomen: Incidental imaging of upper abdominal contents without acute process. Imaged portions of liver, gallbladder, pancreas, spleen, adrenal glands and kidneys are unremarkable. No acute gastrointestinal process to the extent evaluated. Musculoskeletal: Spinal degenerative changes. No acute or destructive bone process. IMPRESSION: 1. Masslike area in the superior segment of the RIGHT lower lobe, medial RIGHT middle lobe and with extension of septal thickening and ground-glass into the RIGHT upper lobe, associated with fissural distortion, little changed aside from slightly less septal thickening and ground-glass in the RIGHT upper lobe compared to previous imaging. 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 2. Top-normal size of mediastinal lymph nodes, refer to prior PET scan little change from prior imaging. Aortic Atherosclerosis (ICD10-I70.0). Electronically Signed   By: Zetta Bills M.D.   On: 11/06/2020 14:48      ASSESSMENT/PLAN    Right upper and middle lobe consolidated mass with hilar and mediastinal lymphadenopathy -Patient presents today for tissue sampling via bronchoscopy as well as navigational bronchoscopy and endobronchial ultrasound assisted lymph node biopsies.     -Reviewed risks/complications and benefits with patient, risks include infection, pneumothorax/pneumomediastinum which may require chest tube placement as well as overnight/prolonged hospitalization and possible mechanical ventilation. Other risks include bleeding and very rarely death.  Patient understands risks and wishes to proceed.  Additional questions were answered, and patient is aware that post procedure patient will be going home with family and may experience cough with possible clots on expectoration as well as phlegm which may last few days as well as hoarseness of voice post intubation and mechanical ventilation.     Thank you for allowing me to participate in the care of this patient.   Patient/Family are satisfied with care plan and all questions have been answered.  This document was prepared using Dragon voice recognition software and may include unintentional dictation errors.     Ottie Glazier, M.D.  Division of [place]

## 2020-11-07 NOTE — Anesthesia Procedure Notes (Addendum)
Procedure Name: Intubation Performed by: Demetrius Charity, CRNA Pre-anesthesia Checklist: Patient identified, Patient being monitored, Timeout performed, Emergency Drugs available and Suction available Patient Re-evaluated:Patient Re-evaluated prior to induction Oxygen Delivery Method: Circle system utilized Preoxygenation: Pre-oxygenation with 100% oxygen Induction Type: IV induction Ventilation: Mask ventilation without difficulty Laryngoscope Size: 3 and McGraph Grade View: Grade III Tube type: Oral Tube size: 9.0 mm Number of attempts: 1 Airway Equipment and Method: Stylet Placement Confirmation: ETT inserted through vocal cords under direct vision,  positive ETCO2 and breath sounds checked- equal and bilateral Secured at: 21 cm Tube secured with: Tape Dental Injury: Teeth and Oropharynx as per pre-operative assessment  Difficulty Due To: Difficulty was anticipated and Difficult Airway- due to anterior larynx

## 2020-11-07 NOTE — Anesthesia Postprocedure Evaluation (Signed)
Anesthesia Post Note  Patient: Katherine Perry  Procedure(s) Performed: VIDEO BRONCHOSCOPY WITH ENDOBRONCHIAL ULTRASOUND (N/A ) VIDEO BRONCHOSCOPY WITH ENDOBRONCHIAL NAVIGATION (N/A )  Patient location during evaluation: PACU Anesthesia Type: General Level of consciousness: awake and alert Pain management: pain level controlled Vital Signs Assessment: post-procedure vital signs reviewed and stable Respiratory status: spontaneous breathing, nonlabored ventilation, respiratory function stable and patient connected to nasal cannula oxygen Cardiovascular status: blood pressure returned to baseline and stable Postop Assessment: no apparent nausea or vomiting Anesthetic complications: no   No complications documented.   Last Vitals:  Vitals:   11/07/20 1456 11/07/20 1515  BP: (!) 166/74 (!) 163/65  Pulse: 85 77  Resp: 17 20  Temp: (!) 36.2 C 36.8 C  SpO2: 99% 96%    Last Pain:  Vitals:   11/07/20 1515  TempSrc: Temporal  PainSc: 0-No pain                 Martha Clan

## 2020-11-07 NOTE — Anesthesia Preprocedure Evaluation (Signed)
Anesthesia Evaluation  Patient identified by MRN, date of birth, ID band Patient awake    Reviewed: Allergy & Precautions, NPO status , Patient's Chart, lab work & pertinent test results  History of Anesthesia Complications Negative for: history of anesthetic complications  Airway Mallampati: II  TM Distance: >3 FB Neck ROM: Full    Dental no notable dental hx.    Pulmonary asthma , former smoker,    breath sounds clear to auscultation- rhonchi (-) wheezing      Cardiovascular Exercise Tolerance: Good hypertension, Pt. on medications (-) CAD, (-) Past MI, (-) Cardiac Stents and (-) CABG  Rhythm:Regular Rate:Normal - Systolic murmurs and - Diastolic murmurs    Neuro/Psych neg Seizures PSYCHIATRIC DISORDERS Anxiety negative neurological ROS     GI/Hepatic Neg liver ROS, GERD  ,  Endo/Other  negative endocrine ROSneg diabetes  Renal/GU negative Renal ROS     Musculoskeletal negative musculoskeletal ROS (+)   Abdominal (+) - obese,   Peds  Hematology negative hematology ROS (+)   Anesthesia Other Findings Past Medical History: No date: Anxiety No date: Asthma No date: Cough No date: Dyspnea     Comment:  with exertion No date: GERD (gastroesophageal reflux disease) No date: Hypertension   Reproductive/Obstetrics                             Anesthesia Physical Anesthesia Plan  ASA: II  Anesthesia Plan: General   Post-op Pain Management:    Induction: Intravenous  PONV Risk Score and Plan: 2 and Ondansetron, Dexamethasone and Midazolam  Airway Management Planned: Oral ETT  Additional Equipment:   Intra-op Plan:   Post-operative Plan: Extubation in OR  Informed Consent: I have reviewed the patients History and Physical, chart, labs and discussed the procedure including the risks, benefits and alternatives for the proposed anesthesia with the patient or authorized  representative who has indicated his/her understanding and acceptance.     Dental advisory given  Plan Discussed with: CRNA and Anesthesiologist  Anesthesia Plan Comments:         Anesthesia Quick Evaluation

## 2020-11-07 NOTE — Procedures (Signed)
ELECTROMAGNETIC NAVIGATIONAL BRONCHOSCOPY PROCEDURE NOTE  FIBEROPTIC BRONCHOSCOPY WITH BRONCHOALVEOLAR LAVAGE PROCEDURE NOTE  ENDOBRONCHIAL ULTRASOUND PROCEDURE NOTE    Flexible bronchoscopy was performed  by : Katherine Gins MD  assistance by : 1)Repiratory therapist  and 2)[employer] staff and 3) Anesthesia team and 4) Flouroscopy team and 5) [employer] supporting staff   Indication for the procedure was :  Pre-procedural H&P. The following assessment was performed on the day of the procedure prior to initiating sedation History:  Chest pain n Dyspnea y Hemoptysis n Cough y Fever n Other pertinent items n  Examination Vital signs -reviewed as per nursing documentation today Cardiac    Murmurs: n  Rubs : n  Gallop: n Lungs Wheezing: n Rales : n Rhonchi :y  Other pertinent findings: SOB/hypoxemia due to chronic lung disease   Pre-procedural assessment for Procedural Sedation included: Depth of sedation: As per anesthesia team  ASA Classification:  2 Mallampati airway assessment: 3    Medication list reviewed: y  The patient's interval history was taken and revealed: no new complaints The pre- procedure physical examination revealed: No new findings Refer to prior clinic note for details.  Informed Consent: Informed consent was obtained from:  patient after explanation of procedure and risks, benefits, as well as alternative procedures available.  Explanation of level of sedation and possible transfusion was also provided.    Procedural Preparation: Time out was performed and patient was identified by name and birthdate and procedure to be performed and side for sampling, if any, was specified. Pt was intubated by anesthesia.  The patient was appropriately draped.   Fiberoptic bronchoscopy with airway inspection and BAL Procedure findings:   Media Information       Media Information                    Bronchoscope was inserted  via ETT  without difficulty.  Posterior oropharynx, epiglottis, arytenoids, false cords and vocal cords were not visualized as these were bypassed by endotracheal tube. The distal trachea was normal in circumference and appearance without mucosal, cartilaginous or branching abnormalities.  The main carina was mildly splayed . All right and left lobar airways were visualized to the Subsegmental level.  Sub- sub segmental carinae were identified in all the distal airways.   Secretions were visible in the following airways and appeared to be clear.  The mucosa was : friable at RUL  Airways were notable for:        exophytic lesions :n       extrinsic compression in the following distributions: n.       Friable mucosa: y       Neurosurgeon /pigmentation: n     Post procedure Diagnosis:                           MULTIPLE SEGMENTS WITH COMPLETE COLLAPSE                           MUCUS PLUGGING  OF BILATERAL AIRWAYS                    SEVERE EDEMA OF RIGHT SIDED AIRWAYS IN ALL 3 LOBES     Electromagnetic Navigational Bronchoscopy Procedure Findings:  After appropriate CT-guided planning ENB scope was advanced via endotracheal tube and LG was advanced for registration.  Post appropriate planning and registration peripheral navigation was used to visualize target lesion.  Right lower lobe lesion - BAL x 3, Cytobrush x 3, microbursh x 2, surgical pathology x 3   Right upper lobe - BAL x2, cytobrush x 2, microbrush x 2, surgical pathology x 2   Post procedure diagnosis:   Acute inflammatory changes suggestive of infection      Endobronchial ultrasound assisted hilar and mediastinal lymph node biopsies procedure findings: The fiberoptic bronchoscope was removed and the EBUS scope was introduced. Examination began to evaluate for pathologically enlarged lymph nodes starting on the Left  side progressing to the Right side.  All lymph node biopsies performed with 21g needle. Lymph  node biopsies were sent in cytolite for all stations.   Post procedure diagnosis:  - Reactive lymphadenopathy  Station 4L - 22m - not biopsied Staion 7 - 1.7cm biopsied 4 times staion 4R - 1.1cm biopsied 3 times staion 11R- >1cm with infiltate/mass - biopsied 4 times   Specimens obtained included:                           Microbiology brushes: RUL, RLL; sent for cyto and micro                     Cytology brushes : RUL and RLL - sent for cyto and micro  Broncho-alveolar lavage site:RLL and RUL   sent for cyto and micro                              931mvolume infused 50 ml volume returned with serosang/blood with cellular debri appearance    Fluoroscopy Used: yes ;        Pictorial documentation attached: above                   Immediate sampling complications included:none  Epinephrine zero ml was used topically  The bronchoscopy was terminated due to completion of the planned procedure and the bronchoscope was removed.   Total dosage of Lidocaine was zero mg Total fluoroscopy time was 1.1  minutes   Estimated Blood loss: expected 5 cc.  Complications included:  none immediate   Preliminary CXR findings :  n  Disposition: home , I spoke with [name] procedure to review prelim findings  Follow up with Dr. AlLanney Ginsn 5-10 days for result discussion.     FuOttie GlazierD  [facility]

## 2020-11-07 NOTE — Progress Notes (Signed)
Patient ambulated while on pulse ox. Sats remained 97% with ambulation on room air.

## 2020-11-08 LAB — BODY FLUID CELL COUNT WITH DIFFERENTIAL
Eos, Fluid: 5 %
Lymphs, Fluid: 54 %
Monocyte-Macrophage-Serous Fluid: 19 % — ABNORMAL LOW (ref 50–90)
Neutrophil Count, Fluid: 22 % (ref 0–25)
Total Nucleated Cell Count, Fluid: 47 cu mm (ref 0–1000)

## 2020-11-09 LAB — ACID FAST SMEAR (AFB, MYCOBACTERIA): Acid Fast Smear: NEGATIVE

## 2020-11-10 ENCOUNTER — Encounter: Payer: Self-pay | Admitting: *Deleted

## 2020-11-10 ENCOUNTER — Encounter: Payer: Self-pay | Admitting: Pulmonary Disease

## 2020-11-10 LAB — CYTOLOGY - NON PAP

## 2020-11-10 NOTE — Transfer of Care (Signed)
Immediate Anesthesia Transfer of Care Note  Patient: Katherine Perry  Procedure(s) Performed: VIDEO BRONCHOSCOPY WITH ENDOBRONCHIAL ULTRASOUND (N/A ) VIDEO BRONCHOSCOPY WITH ENDOBRONCHIAL NAVIGATION (N/A )  Patient Location: PACU  Anesthesia Type:General  Level of Consciousness: drowsy  Airway & Oxygen Therapy: Patient Spontanous Breathing and Patient connected to face mask oxygen  Post-op Assessment: Report given to RN and Post -op Vital signs reviewed and stable  Post vital signs: Reviewed and stable  Last Vitals:  Vitals Value Taken Time  BP 176/75 11/07/20 1542  Temp    Pulse 84 11/07/20 1542  Resp 18 11/07/20 1542  SpO2 97 % 11/07/20 1542    Last Pain:  Vitals:   11/10/20 0821  TempSrc:   PainSc: 0-No pain         Complications: No complications documented.

## 2020-11-10 NOTE — Progress Notes (Signed)
  Oncology Nurse Navigator Documentation  Navigator Location: [facility] (11/10/20 1000) Referral Date to RadOnc/MedOnc: 11/10/20 (11/10/20 1000) )Navigator Encounter Type: Initial MedOnc (11/10/20 1000)   Abnormal Finding Date: 10/03/20 (11/10/20 1000) Confirmed Diagnosis Date: 11/10/20 (11/10/20 1000)                 Treatment Phase: Pre-Tx/Tx Discussion (11/10/20 1000) Barriers/Navigation Needs: Coordination of Care (11/10/20 1000)   Interventions: Coordination of Care;Referrals (11/10/20 1000) Referrals: Radiation Oncology (11/10/20 1000) Coordination of Care: Appts (11/10/20 1000)         referral received today and assisted pt with coordinating care. Pt scheduled for new patient visit with Dr. Janese Banks on Fri 4/15 at 66am. Consult with Dr. Baruch Gouty scheduled for Mon 4/18 at 11am. Pt made aware of preliminary results at this time and informed that results will be finalized this week and discussed with her at her appt on Friday. All questions answered during call. Reviewed all upcoming appts. Contact info given and instructed to call with any further questions or needs. Pt verbalized understanding. Nothing further needed at this time.         Time Spent with Patient: 30 (11/10/20 1000)

## 2020-11-11 LAB — CULTURE, BAL-QUANTITATIVE W GRAM STAIN: Culture: 80000 — AB

## 2020-11-11 LAB — SURGICAL PATHOLOGY

## 2020-11-12 LAB — ASPERGILLUS ANTIGEN, BAL/SERUM: Aspergillus Ag, BAL/Serum: 0.21 {index} (ref 0.00–0.49)

## 2020-11-14 ENCOUNTER — Other Ambulatory Visit: Payer: Self-pay

## 2020-11-14 ENCOUNTER — Inpatient Hospital Stay: Payer: BC Managed Care – PPO | Attending: Oncology | Admitting: Oncology

## 2020-11-14 ENCOUNTER — Inpatient Hospital Stay: Payer: BC Managed Care – PPO

## 2020-11-14 ENCOUNTER — Encounter: Payer: Self-pay | Admitting: *Deleted

## 2020-11-14 VITALS — BP 135/58 | HR 91 | Temp 98.1°F | Resp 16 | Wt 159.6 lb

## 2020-11-14 DIAGNOSIS — I10 Essential (primary) hypertension: Secondary | ICD-10-CM | POA: Diagnosis not present

## 2020-11-14 DIAGNOSIS — E871 Hypo-osmolality and hyponatremia: Secondary | ICD-10-CM | POA: Diagnosis not present

## 2020-11-14 DIAGNOSIS — Z87891 Personal history of nicotine dependence: Secondary | ICD-10-CM | POA: Insufficient documentation

## 2020-11-14 DIAGNOSIS — C3431 Malignant neoplasm of lower lobe, right bronchus or lung: Secondary | ICD-10-CM | POA: Insufficient documentation

## 2020-11-14 DIAGNOSIS — C3490 Malignant neoplasm of unspecified part of unspecified bronchus or lung: Secondary | ICD-10-CM

## 2020-11-14 NOTE — Progress Notes (Signed)
  Oncology Nurse Navigator Documentation  Navigator Location: [facility] (11/14/20 1000)   )Navigator Encounter Type: Initial MedOnc (11/14/20 1000)                       Treatment Phase: Pre-Tx/Tx Discussion (11/14/20 1000) Barriers/Navigation Needs: Coordination of Care;Education (11/14/20 1000) Education: Newly Diagnosed Cancer Education;Understanding Cancer/ Treatment Options (11/14/20 1000) Interventions: Coordination of Care;Education;Referrals (11/14/20 1000) Referrals: Radiation Oncology;Other (thoracic surgery) (11/14/20 1000) Coordination of Care: Appts;Radiology (11/14/20 1000) Education Method: Verbal;Written (11/14/20 1000)      Acuity: Level 2-Minimal Needs (1-2 Barriers Identified) (11/14/20 1000)      met with patient during initial consult with Dr. Janese Banks. All questions answered during visit. Reviewed upcoming appts with patient. Resources given to patient regarding possible lung cancer diagnosis and supportive services available. Contact info given and instructed to call with any further questions or needs. Pt verbalized understanding.   Time Spent with Patient: 60 (11/14/20 1000)

## 2020-11-14 NOTE — Progress Notes (Addendum)
Hematology/Oncology Consult note [facility] Telephone:([phone] [phone]  Patient Care Team: Rusty Aus, MD as PCP - General (Internal Medicine) Telford Nab, RN as Oncology Nurse Navigator   Name of the patient: Katherine Perry  [id number]  01/29/1956    Reason for referral-new diagnosis of adenocarcinoma right lower lobe   Referring physician-Dr. Lanney Gins  Date of visit: 11/14/20   History of presenting illness-patient is a 66 year old female with a past medical history significant for 1-1/2 pack/day smoking for about 20 years.  She quit smoking about 26 years ago.  Other medical problems include hypertension and hyponatremia.She has been treated for iron of possible pneumonia for the last 1 year.  She has a history of intermittent asthma for which she uses Advair.  She was seen by Dr. And underwent CT chest which showed dense infiltrate/solid mass in the right lower lobe with contiguous airspace opacity in the right upper lobe.  Findings could be secondary to pneumonia but concerning for bronchogenic carcinoma.  Patient then had a PET CT scan which showed consolidation in the medial aspect of the right lower lobe measuring 3.4 x 4 cm with an SUV of 11.6.  Hypermetabolic masslike thickening in the right hilum measuring 2.2 cm with intense hypermetabolic activity.  Groundglass airspace consolidation in the posterior aspect of the right upper lobe with an SUV of 11.4.  The right upper lobe opacity was nonspecific and differentials include pulmonary infection versus lymphangitic spread of carcinoma.  CT chest showed showed masslike area of distortion involving right lower lobe middle lobe as well as right upper lobe.  Generalized right hilar masslike appearance concerning for nodal involvement.  Patient underwent bronchoscopy and biopsies.Right lower lobe was concerning for adenocarcinoma.  Lymph node station 11 R, 4R, 7 were negative for malignancy.  No  malignant cells identified in the right upper lobe.  Patient currently reports ongoing fatigue.  She has occasional dry nonproductive cough.  Denies any fevers or significant sputum production.  Appetite and weight have remained stable.  Mild exertional shortness of  ECOG PS- 1  Pain scale- 0   Review of systems- Review of Systems  Constitutional: Negative for chills, fever, malaise/fatigue and weight loss.  HENT: Negative for congestion, ear discharge and nosebleeds.   Eyes: Negative for blurred vision.  Respiratory: Positive for cough. Negative for hemoptysis, sputum production, shortness of breath and wheezing.   Cardiovascular: Negative for chest pain, palpitations, orthopnea and claudication.  Gastrointestinal: Negative for abdominal pain, blood in stool, constipation, diarrhea, heartburn, melena, nausea and vomiting.  Genitourinary: Negative for dysuria, flank pain, frequency, hematuria and urgency.  Musculoskeletal: Negative for back pain, joint pain and myalgias.  Skin: Negative for rash.  Neurological: Negative for dizziness, tingling, focal weakness, seizures, weakness and headaches.  Endo/Heme/Allergies: Does not bruise/bleed easily.  Psychiatric/Behavioral: Negative for depression and suicidal ideas. The patient does not have insomnia.     No Known Allergies  Patient Active Problem List   Diagnosis Date Noted  . Chronic hyponatremia 01/16/2020  . Laryngopharyngeal reflux (LPR) 10/18/2019  . Family history of colon cancer 09/11/2018  . Tubular adenoma 09/11/2018  . B12 deficiency 09/07/2016  . Benign essential hypertension 07/05/2016  . Mild reactive airways disease 08/29/2014  . Lumbar disc disease 12/13/2013     Past Medical History:  Diagnosis Date  . Anxiety   . Asthma   . Cough   . Dyspnea    with exertion  . GERD (gastroesophageal reflux disease)   .  Hypertension      Past Surgical History:  Procedure Laterality Date  . BUNIONECTOMY Bilateral   .  COLONOSCOPY    . VIDEO BRONCHOSCOPY WITH ENDOBRONCHIAL NAVIGATION N/A 11/07/2020   Procedure: VIDEO BRONCHOSCOPY WITH ENDOBRONCHIAL NAVIGATION;  Surgeon: Ottie Glazier, MD;  Location: [facility] ORS;  Service: Thoracic;  Laterality: N/A;  . VIDEO BRONCHOSCOPY WITH ENDOBRONCHIAL ULTRASOUND N/A 11/07/2020   Procedure: VIDEO BRONCHOSCOPY WITH ENDOBRONCHIAL ULTRASOUND;  Surgeon: Ottie Glazier, MD;  Location: [facility] ORS;  Service: Thoracic;  Laterality: N/A;    Social History   Socioeconomic History  . Marital status: Married    Spouse name: Not on file  . Number of children: Not on file  . Years of education: Not on file  . Highest education level: Not on file  Occupational History  . Not on file  Tobacco Use  . Smoking status: Former Smoker    Packs/day: 1.00    Years: 15.00    Pack years: 15.00    Types: Cigarettes    Quit date: 11/07/1994    Years since quitting: 26.0  . Smokeless tobacco: Never Used  Vaping Use  . Vaping Use: Never used  Substance and Sexual Activity  . Alcohol use: Yes    Comment:  wine daily  . Drug use: Never  . Sexual activity: Not on file  Other Topics Concern  . Not on file  Social History Narrative  . Not on file   Social Determinants of Health   Financial Resource Strain: Not on file  Food Insecurity: Not on file  Transportation Needs: Not on file  Physical Activity: Not on file  Stress: Not on file  Social Connections: Not on file  Intimate Partner Violence: Not on file     No family history on file.   Current Outpatient Medications:  .  ADVAIR DISKUS 250-50 MCG/DOSE AEPB, Inhale 1 puff into the lungs as needed., Disp: , Rfl:  .  ALPRAZolam (XANAX) 0.5 MG tablet, Take 0.5 mg by mouth at bedtime as needed for sleep., Disp: , Rfl:  .  amLODipine (NORVASC) 5 MG tablet, Take 5 mg by mouth at bedtime., Disp: , Rfl:  .  Cholecalciferol 50 MCG (2000 UT) CAPS, Take 2,000 Units by mouth daily., Disp: , Rfl:  .  esomeprazole (NEXIUM) 20 MG capsule, Take  20 mg by mouth as needed., Disp: , Rfl:  .  fluticasone (FLONASE) 50 MCG/ACT nasal spray, Place 2 sprays into both nostrils daily as needed for rhinitis., Disp: , Rfl:  .  metoprolol succinate (TOPROL-XL) 50 MG 24 hr tablet, Take 50 mg by mouth every morning., Disp: , Rfl:  .  olmesartan (BENICAR) 40 MG tablet, Take 40 mg by mouth at bedtime., Disp: , Rfl:  .  sulfamethoxazole-trimethoprim (BACTRIM) 400-80 MG tablet, Take by mouth., Disp: , Rfl:  .  zolpidem (AMBIEN) 10 MG tablet, Take 10 mg by mouth at bedtime as needed for sleep., Disp: , Rfl:    Physical exam:  Vitals:   11/14/20 0835  BP: (!) 135/58  Pulse: 91  Resp: 16  Temp: 98.1 F (36.7 C)  TempSrc: Tympanic  SpO2: 100%  Weight: 159 lb 9.6 oz (72.4 kg)   Physical Exam Cardiovascular:     Rate and Rhythm: Normal rate and regular rhythm.     Heart sounds: Normal heart sounds.  Pulmonary:     Effort: Pulmonary effort is normal.     Breath sounds: Normal breath sounds.  Abdominal:     General: Bowel  sounds are normal.     Palpations: Abdomen is soft.  Skin:    General: Skin is warm and dry.  Neurological:     Mental Status: She is alert and oriented to person, place, and time.        No flowsheet data found. CBC Latest Ref Rng & Units 11/07/2020  WBC 4.0 - 10.5 K/uL 7.0  Hemoglobin 12.0 - 15.0 g/dL 11.6(L)  Hematocrit 36.0 - 46.0 % 34.7(L)  Platelets 150 - 400 K/uL 295    No images are attached to the encounter.  NM PET Image Initial (PI) Skull Base To Thigh  Result Date: 10/16/2020 CLINICAL DATA:  Initial treatment strategy for RIGHT lower lobe lung mass. EXAM: NUCLEAR MEDICINE PET SKULL BASE TO THIGH TECHNIQUE: 8.7 mCi F-18 FDG was injected intravenously. Full-ring PET imaging was performed from the skull base to thigh after the radiotracer. CT data was obtained and used for attenuation correction and anatomic localization. Fasting blood glucose: 86 mg/dl COMPARISON:  CT 10/02/2020 FINDINGS: Mediastinal blood  pool activity: SUV max 2.7 Liver activity: SUV max NA NECK: Consolidation within the medial aspect of the RIGHT lower lobe extending from the hilum the lung base is similar morphology in short interval follow-up measuring 3.4 by 4.0 cm in axial dimension compared to 3.3 by 4.1 cm on prior. This consolidation is intensely hypermetabolic with SUV max equal 11.6. There is hypermetabolic masslike thickening at the RIGHT hilum measuring 2.2 cm (image 94) also with intense metabolic activity. The ground-glass airspace disease/consolidation in the posterior aspect of the RIGHT upper lobe is unchanged morphology and also has intense metabolic activity SUV max equal 11.4. No hypermetabolic mediastinal lymph nodes. No hypermetabolic supraclavicular nodes. Incidental CT findings: none CHEST: Incidental CT findings: none ABDOMEN/PELVIS: No abnormal hypermetabolic activity within the liver, pancreas, adrenal glands, or spleen. No hypermetabolic lymph nodes in the abdomen or pelvis. Incidental CT findings: None SKELETON: No focal hypermetabolic activity to suggest skeletal metastasis. Incidental CT findings: none IMPRESSION: 1. Hypermetabolic consolidative mass in the medial aspect of the RIGHT lower lobe extending from the hilum to the lung base. Hypermetabolic LEFT hilar mass. Findings remain highly concerning for bronchogenic carcinoma although pulmonary infection could have similar pattern. No significant change from CT 10/02/2020. 2. Band intensely hypermetabolic ground-glass density in the RIGHT upper lobe with differential including pulmonary infection versus lymphangitic spread of carcinoma. 3. Consider bronchoscopy for evaluation of the RIGHT lower lobe pulmonary mass. 4. No hypermetabolic mediastinal lymph nodes or distant malignancy. Electronically Signed   By: Suzy Bouchard M.D.   On: 10/16/2020 14:33   DG Chest Portable 1 View  Result Date: 11/07/2020 CLINICAL DATA:  Post bronchoscopy EXAM: PORTABLE CHEST 1  VIEW COMPARISON:  None. FINDINGS: The heart size and mediastinal contours are unchanged with right suprahilar nodular airspace opacity. Again noted is reticulonodular patchy airspace opacities in the right upper lung. No pneumothorax is seen. No pleural effusion. IMPRESSION: Stable right suprahilar and upper lobe airspace opacity/masslike consolidation. No acute cardiopulmonary process. Electronically Signed   By: Prudencio Pair M.D.   On: 11/07/2020 15:11   DG C-Arm 1-60 Min-No Report  Result Date: 11/07/2020 Fluoroscopy was utilized by the requesting physician.  No radiographic interpretation.   CT Super D Chest Wo Contrast  Result Date: 11/06/2020 CLINICAL DATA:  Preparation for navigational bronchoscopy. EXAM: CT CHEST WITHOUT CONTRAST TECHNIQUE: Multidetector CT imaging of the chest was performed using thin slice collimation for electromagnetic bronchoscopy planning purposes, without intravenous contrast. COMPARISON:  October 02, 2020 and prior PET scan from October 16, 2020 FINDINGS: Cardiovascular: Calcified atheromatous plaque the thoracic aorta. Nonaneurysmal appearance of the thoracic aorta. Normal heart size. Mass/abnormal soft tissue abutting the posterior aspect of the RIGHT border of the heart, posterior LEFT atrium to the RIGHT of midline. Central pulmonary vasculature is normal caliber. Mediastinum/Nodes: Esophagus patulous and inseparable from the masslike area in the medial RIGHT chest on image 90 of series 2. Subcarinal nodal enlargement with similar appearance to recent chest CT, largest approximately 10 mm. RIGHT paratracheal lymph nodes with mild enlargement similar to prior imaging. Small anterior mediastinal lymph node on image 55 of series 2 unchanged is 6 mm. No supraclavicular adenopathy. No axillary adenopathy. Lungs/Pleura: Large masslike area in the medial RIGHT chest and RIGHT hilar mass with associated distortion of the major fissure, dominant area centered in the superior segment of  the RIGHT lower lobe but also involving RIGHT hilum and with extension of ground-glass, bronchiectasis and septal thickening with interstitial thickening into the RIGHT upper lobe and into the RIGHT lower lobe as on previous imaging. Dominant area in axial dimension measuring 6.6 by 3.2 cm as compared to 7.2 x 2.7 cm. Just below the RIGHT hilum the area measures approximately 5.1 x 5.0 cm, little changed compared to previous imaging Upper lobe findings may be slightly smaller and or less confluent measuring approximately 9 by 4.8 cm as compared to 8.7 x 5.9 cm on the previous exam LEFT lung is clear. Upper Abdomen: Incidental imaging of upper abdominal contents without acute process. Imaged portions of liver, gallbladder, pancreas, spleen, adrenal glands and kidneys are unremarkable. No acute gastrointestinal process to the extent evaluated. Musculoskeletal: Spinal degenerative changes. No acute or destructive bone process. IMPRESSION: 1. Masslike area in the superior segment of the RIGHT lower lobe, medial RIGHT middle lobe and with extension of septal thickening and ground-glass into the RIGHT upper lobe, associated with fissural distortion, little changed aside from slightly less septal thickening and ground-glass in the RIGHT upper lobe compared to previous imaging. 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 2. Top-normal size of mediastinal lymph nodes, refer to prior PET scan little change from prior imaging. Aortic Atherosclerosis (ICD10-I70.0). Electronically Signed   By: Zetta Bills M.D.   On: 11/06/2020 14:48    Assessment and plan- Patient is a 65 y.o. female referred for new diagnosis of adenocarcinoma of the lung  I have reviewed PET CT scan images as well as CT scan images independently and discussed findings with the  patient.  I have shown the pet images to the patient and her husband as well.PET scan mainly showed a right lower lobe mass extending to the right hilum measuring about 3.4 x 4 cm with an SUV of 11.6.  There was hypermetabolism noted in the right hilum measuring 2.2 cm as well.  There was groundglass disease/consolidation seen in the right upper lobe with an SUV of 11.4.  No evidence of distant metastatic disease  Biopsy from the right lower lobe was consistent with adenocarcinoma.  I have discussed her case with Dr. Manfred Shirts from pathology.  Cells were positive for CK7 but negative for TTF-1 and there was some positivity seen for GATA3 as well.  GATA3 positivity is rare in lung and usually seen in breast cancer but there is no evidence of any abnormality in the breast seen on PET scan or CT  scan or physical exam.  Clinically based on radiology findings this appears as lung cancer.  Pathology will be working on additional immunostains from remaining cell blocks as well.  I discussed patient's case with radiology and at present is unclear what is the hypermetabolic lesion in the right hilum represents a primary lung mass versus lymph nodes.  Patient had multipleLymph nodes sampled from station 4R 11 R 7 which were negative for malignancy.  Hence it is unclear if we can assume that the lymph nodes are involved based on PET scan (hypermetabolic and can also be seen in cases of infection ) when pathology has been negative.  Also the right upper lobe hypermetabolism noted on PET scan was nonspecific and could be seen with infection/inflammation as well.  Cytology brushings from right upper lobe were negative for malignancy.  Present it is unclear as to what is the patient's true extent of disease to assign an appropriate stage for her.  I will discuss her case at tumor board next week to come to a consensus.  I will also refer the patient to Dr. Roxan Hockey to see if she would be a potential surgical candidate.   I will get back with the patient after consensus from tumor board is obtained.  If we assume hilar lymph nodes and right upper lobe is not involved with malignancy we are potentially dealing with stage II disease which can be either managed with surgical resection versus definitive radiation.  Concurrent chemoradiation followed by maintenance immunotherapy is recommended in stage III disease which would be the case if we are assuming the right hilum the notes lymph node involvement  I will also obtain MRI brain with and without contrast to complete her staging work-up   Thank you for this kind referral and the opportunity to participate in the care of this patient   Visit Diagnosis 1. Malignant neoplasm of lung, unspecified laterality, unspecified part of lung ([place])   2. Malignant neoplasm of unspecified part of unspecified bronchus or lung ([place])     Dr. Randa Evens, MD, MPH [facility] [phone] 11/14/2020 8:51 AM

## 2020-11-17 ENCOUNTER — Encounter: Payer: Self-pay | Admitting: Radiation Oncology

## 2020-11-17 ENCOUNTER — Encounter: Payer: Self-pay | Admitting: Oncology

## 2020-11-17 ENCOUNTER — Ambulatory Visit
Admission: RE | Admit: 2020-11-17 | Discharge: 2020-11-17 | Disposition: A | Discharge status: Home / Self Care | Payer: BC Managed Care – PPO | Source: Ambulatory Visit | Attending: Radiation Oncology | Admitting: Radiation Oncology

## 2020-11-17 ENCOUNTER — Other Ambulatory Visit: Payer: Self-pay

## 2020-11-17 ENCOUNTER — Telehealth: Payer: Self-pay | Admitting: *Deleted

## 2020-11-17 VITALS — BP 169/66 | HR 93 | Temp 97.4°F | Resp 16 | Wt 161.6 lb

## 2020-11-17 DIAGNOSIS — R059 Cough, unspecified: Secondary | ICD-10-CM | POA: Diagnosis not present

## 2020-11-17 DIAGNOSIS — C3490 Malignant neoplasm of unspecified part of unspecified bronchus or lung: Secondary | ICD-10-CM

## 2020-11-17 DIAGNOSIS — Z87891 Personal history of nicotine dependence: Secondary | ICD-10-CM | POA: Insufficient documentation

## 2020-11-17 DIAGNOSIS — J45909 Unspecified asthma, uncomplicated: Secondary | ICD-10-CM | POA: Insufficient documentation

## 2020-11-17 DIAGNOSIS — C3431 Malignant neoplasm of lower lobe, right bronchus or lung: Secondary | ICD-10-CM | POA: Diagnosis not present

## 2020-11-17 DIAGNOSIS — Z79899 Other long term (current) drug therapy: Secondary | ICD-10-CM | POA: Insufficient documentation

## 2020-11-17 DIAGNOSIS — K219 Gastro-esophageal reflux disease without esophagitis: Secondary | ICD-10-CM | POA: Diagnosis not present

## 2020-11-17 DIAGNOSIS — I10 Essential (primary) hypertension: Secondary | ICD-10-CM | POA: Insufficient documentation

## 2020-11-17 LAB — CYTOLOGY - NON PAP

## 2020-11-17 LAB — SURGICAL PATHOLOGY

## 2020-11-17 NOTE — Consult Note (Signed)
NEW PATIENT EVALUATION  Name: Katherine Perry  MRN: [id number]  Date:   11/17/2020     DOB: 1956-03-29   This 65 y.o. female patient presents to the clinic for initial evaluation of adenocarcinoma the right lung either stage II or III not completely stage.  REFERRING PHYSICIAN: Rusty Aus, MD  CHIEF COMPLAINT:  Chief Complaint  Patient presents with  . Lung Cancer    Initial consultation    DIAGNOSIS: The encounter diagnosis was Malignant neoplasm of bronchus and lung ([place]).   PREVIOUS INVESTIGATIONS:  PET/CT CT scans reviewed Pathology report reviewed Clinical notes reviewed  HPI: Patient is a 65 year old female greater than 50-pack-year smoking history who presented with an abnormal CT scan of the chest showing a masslike consolidation in the right lower lobe with contiguous airspace opacity.  Patient had a PET CT scan showing a 3.4 x 4 cm hypermetabolic right lower lobe mass with hypermetabolic activity in the right hilum.  There is also groundglass airspace consolidation posterior aspect of the right upper lobe with an SUV of 11.4 although that may be infectious in nature.  Patient underwent bronchoscopy with only 1 cytologic sample showing suspicious cells concerning for non-small cell lung cancer favoring adenocarcinoma.  No lymph node biopsies were positive.  Patient has been seen by medical oncology MRI of brain for staging completeness has been ordered.  Referral was also made for thoracic surgery evaluation.  She is seen today continues to have a cough she is currently on antibiotics for presumed pneumonia.  She is having no bone pain.  PLANNED TREATMENT REGIMEN: Complete staging and evaluation by thoracic surgery  PAST MEDICAL HISTORY:  has a past medical history of Anxiety, Asthma, Cough, Dyspnea, GERD (gastroesophageal reflux disease), and Hypertension.    PAST SURGICAL HISTORY:  Past Surgical History:  Procedure Laterality Date  . BUNIONECTOMY Bilateral   .  COLONOSCOPY    . VIDEO BRONCHOSCOPY WITH ENDOBRONCHIAL NAVIGATION N/A 11/07/2020   Procedure: VIDEO BRONCHOSCOPY WITH ENDOBRONCHIAL NAVIGATION;  Surgeon: Ottie Glazier, MD;  Location: [facility] ORS;  Service: Thoracic;  Laterality: N/A;  . VIDEO BRONCHOSCOPY WITH ENDOBRONCHIAL ULTRASOUND N/A 11/07/2020   Procedure: VIDEO BRONCHOSCOPY WITH ENDOBRONCHIAL ULTRASOUND;  Surgeon: Ottie Glazier, MD;  Location: [facility] ORS;  Service: Thoracic;  Laterality: N/A;    FAMILY HISTORY: family history is not on file.  SOCIAL HISTORY:  reports that she quit smoking about 26 years ago. Her smoking use included cigarettes. She has a 15.00 pack-year smoking history. She has never used smokeless tobacco. She reports current alcohol use. She reports that she does not use drugs.  ALLERGIES: Patient has no known allergies.  MEDICATIONS:  Current Outpatient Medications  Medication Sig Dispense Refill  . ADVAIR DISKUS 250-50 MCG/DOSE AEPB Inhale 1 puff into the lungs as needed.    . ALPRAZolam (XANAX) 0.5 MG tablet Take 0.5 mg by mouth at bedtime as needed for sleep.    Marland Kitchen amLODipine (NORVASC) 5 MG tablet Take 5 mg by mouth at bedtime.    . Cholecalciferol 50 MCG (2000 UT) CAPS Take 2,000 Units by mouth daily.    Marland Kitchen esomeprazole (NEXIUM) 20 MG capsule Take 20 mg by mouth as needed.    . fluticasone (FLONASE) 50 MCG/ACT nasal spray Place 2 sprays into both nostrils daily as needed for rhinitis.    . metoprolol succinate (TOPROL-XL) 50 MG 24 hr tablet Take 50 mg by mouth every morning.    . olmesartan (BENICAR) 40 MG tablet Take 40 mg by mouth  at bedtime.    . sulfamethoxazole-trimethoprim (BACTRIM) 400-80 MG tablet Take by mouth.    . zolpidem (AMBIEN) 10 MG tablet Take 10 mg by mouth at bedtime as needed for sleep.     No current facility-administered medications for this encounter.    ECOG PERFORMANCE STATUS:  1 - Symptomatic but completely ambulatory  REVIEW OF SYSTEMS: Patient does have asthma, hypertension  hyponatremia intermittent asthma Patient denies any weight loss, fatigue, weakness, fever, chills or night sweats. Patient denies any loss of vision, blurred vision. Patient denies any ringing  of the ears or hearing loss. No irregular heartbeat. Patient denies heart murmur or history of fainting. Patient denies any chest pain or pain radiating to her upper extremities. Patient denies any shortness of breath, difficulty breathing at night, cough or hemoptysis. Patient denies any swelling in the lower legs. Patient denies any nausea vomiting, vomiting of blood, or coffee ground material in the vomitus. Patient denies any stomach pain. Patient states has had normal bowel movements no significant constipation or diarrhea. Patient denies any dysuria, hematuria or significant nocturia. Patient denies any problems walking, swelling in the joints or loss of balance. Patient denies any skin changes, loss of hair or loss of weight. Patient denies any excessive worrying or anxiety or significant depression. Patient denies any problems with insomnia. Patient denies excessive thirst, polyuria, polydipsia. Patient denies any swollen glands, patient denies easy bruising or easy bleeding. Patient denies any recent infections, allergies or URI. Patient "s visual fields have not changed significantly in recent time.   PHYSICAL EXAM: BP (!) 169/66 (BP Location: Left Arm, Patient Position: Sitting)   Pulse 93   Temp (!) 97.4 F (36.3 C) (Tympanic)   Resp 16   Wt 161 lb 9.6 oz (73.3 kg)   BMI 26.89 kg/m  Well-developed well-nourished patient in NAD. HEENT reveals PERLA, EOMI, discs not visualized.  Oral cavity is clear. No oral mucosal lesions are identified. Neck is clear without evidence of cervical or supraclavicular adenopathy. Lungs are clear to A&P. Cardiac examination is essentially unremarkable with regular rate and rhythm without murmur rub or thrill. Abdomen is benign with no organomegaly or masses noted. Motor  sensory and DTR levels are equal and symmetric in the upper and lower extremities. Cranial nerves II through XII are grossly intact. Proprioception is intact. No peripheral adenopathy or edema is identified. No motor or sensory levels are noted. Crude visual fields are within normal range.  LABORATORY DATA: Pathology and cytology reports are reviewed    RADIOLOGY RESULTS: CT scans and PET CT scan reviewed compatible with above-stated findings   IMPRESSION: Possible stage II versus stage III adenocarcinoma the right lower lobe in 65 year old female  PLAN: Based on the pathologic findings being only questionable we need further tissue diagnosis.  Patient also have an MRI of her brain for ruling out brain metastasis.  Patient also needs thoracic surgery evaluation which has been ordered.  Certainly at this time surgical option may be viable to the patient and we will await surgeon's input.  I have set the patient up for 2-week follow-up.  Should this proved to be stage III disease or she is inoperable I would recommend concurrent chemoradiation therapy with curative intent.  We briefly discussed the risks and benefits of treatment and she comprehends her recommendations well.  Referral has been made from Dr. Elroy Channel office to thoracic surgery.  We await their input.  I would like to take this opportunity to thank you for allowing me  to participate in the care of your patient.Noreene Filbert, MD

## 2020-11-17 NOTE — Telephone Encounter (Signed)
Spoke to [place] and she said the appt was 5/5 1 pm and pt was made aware of the appt per [place]. Dr. Janese Banks made aware of appt. Date and time

## 2020-11-20 ENCOUNTER — Other Ambulatory Visit: Payer: BC Managed Care – PPO

## 2020-11-20 NOTE — Progress Notes (Signed)
Tumor Board Documentation  Katherine Perry was presented by Dr Janese Banks at our Tumor Board on 11/20/2020, which included representatives from medical oncology,radiation oncology,internal medicine,navigation,pathology,radiology,surgical,pharmacy,genetics,research,pulmonology.  Katherine Perry currently presents as a current patient,for MDC,for new positive pathology with history of the following treatments: surgical intervention(s).  Additionally, we reviewed previous medical and familial history, history of present illness, and recent lab results along with all available histopathologic and imaging studies. The tumor board considered available treatment options and made the following recommendations: Concurrent chemo-radiation therapy,Additional screening (MRI Brain)    The following procedures/referrals were also placed: No orders of the defined types were placed in this encounter.   Clinical Trial Status: not discussed   Staging used: To be determined  AJCC Staging:       Group: Adenocarcinoma of RLL Lung  National site-specific guidelines   were discussed with respect to the case.  Tumor board is a meeting of clinicians from various specialty areas who evaluate and discuss patients for whom a multidisciplinary approach is being considered. Final determinations in the plan of care are those of the provider(s). The responsibility for follow up of recommendations given during tumor board is that of the provider.   Today's extended care, comprehensive team conference, Katherine Perry was not present for the discussion and was not examined.   Multidisciplinary Tumor Board is a multidisciplinary case peer review process.  Decisions discussed in the Multidisciplinary Tumor Board reflect the opinions of the specialists present at the conference without having examined the patient.  Ultimately, treatment and diagnostic decisions rest with the primary provider(s) and the patient.

## 2020-11-21 ENCOUNTER — Inpatient Hospital Stay (HOSPITAL_BASED_OUTPATIENT_CLINIC_OR_DEPARTMENT_OTHER): Payer: BC Managed Care – PPO | Admitting: Oncology

## 2020-11-21 VITALS — BP 139/61 | HR 87 | Temp 98.4°F | Wt 159.8 lb

## 2020-11-21 DIAGNOSIS — C3431 Malignant neoplasm of lower lobe, right bronchus or lung: Secondary | ICD-10-CM

## 2020-11-21 DIAGNOSIS — Z7189 Other specified counseling: Secondary | ICD-10-CM | POA: Diagnosis not present

## 2020-11-21 DIAGNOSIS — C3490 Malignant neoplasm of unspecified part of unspecified bronchus or lung: Secondary | ICD-10-CM

## 2020-11-23 ENCOUNTER — Encounter: Payer: Self-pay | Admitting: Oncology

## 2020-11-23 DIAGNOSIS — C3431 Malignant neoplasm of lower lobe, right bronchus or lung: Secondary | ICD-10-CM | POA: Insufficient documentation

## 2020-11-23 MED ORDER — LIDOCAINE-PRILOCAINE 2.5-2.5 % EX CREA: TOPICAL_CREAM | CUTANEOUS | 3 refills | Status: DC

## 2020-11-23 MED ORDER — DEXAMETHASONE 4 MG PO TABS: 8.0000 mg | ORAL_TABLET | Freq: Every day | ORAL | 1 refills | Status: DC

## 2020-11-23 MED ORDER — LORAZEPAM 0.5 MG PO TABS: 0.5000 mg | ORAL_TABLET | Freq: Four times a day (QID) | ORAL | 0 refills | Status: DC | PRN

## 2020-11-23 MED ORDER — ONDANSETRON HCL 8 MG PO TABS
8.0000 mg | ORAL_TABLET | Freq: Two times a day (BID) | ORAL | 1 refills | Status: DC | PRN
Start: 2020-11-23 — End: 2021-02-14

## 2020-11-23 MED ORDER — PROCHLORPERAZINE MALEATE 10 MG PO TABS: 10.0000 mg | ORAL_TABLET | Freq: Four times a day (QID) | ORAL | 1 refills | Status: DC | PRN

## 2020-11-23 NOTE — Progress Notes (Signed)
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  She quit smoking about 26 years ago.  Other medical problems include hypertension and hyponatremia.She has been treated for iron of possible pneumonia for the last 1 year.  She has a history of intermittent asthma for which she uses Advair.  She was seen by Dr. And underwent CT chest which showed dense infiltrate/solid mass in the right lower lobe with contiguous airspace opacity in the right upper lobe.  Findings could be secondary to pneumonia but concerning for bronchogenic carcinoma.  Patient then had a PET CT scan which showed consolidation in the medial aspect of the right lower lobe measuring 3.4 x 4 cm with an SUV of 11.6.  Hypermetabolic masslike thickening in the right hilum measuring 2.2 cm with intense hypermetabolic activity.  Groundglass airspace consolidation in the posterior aspect of the right upper lobe with an SUV of 11.4.  The right upper lobe opacity was nonspecific and differentials include pulmonary infection versus lymphangitic spread of carcinoma.  CT chest showed showed masslike area of distortion involving right lower lobe middle lobe as well as right upper lobe.  Generalized right hilar masslike appearance concerning for nodal involvement.  Patient underwent bronchoscopy and biopsies.Right lower lobe was concerning for adenocarcinoma.  Lymph node station 11 R, 4R, 7 were negative  for malignancy.  No malignant cells identified in the right upper lobe.  Interval history-patient is here with her husband today.  She has baseline fatigue and exertional shortness of breath and chronic dry nonproductive cough.  No new complaints at this time.  ECOG PS- 1 Pain scale- 0  Review of systems- Review of Systems  Constitutional: Positive for malaise/fatigue. Negative for chills, fever and weight loss.  HENT: Negative for congestion, ear discharge and nosebleeds.   Eyes: Negative for blurred vision.  Respiratory: Positive for cough. Negative for hemoptysis, sputum production, shortness of breath and wheezing.   Cardiovascular: Negative for chest pain, palpitations, orthopnea and claudication.  Gastrointestinal: Negative for abdominal pain, blood in stool, constipation, diarrhea, heartburn, melena, nausea and vomiting.  Genitourinary: Negative for dysuria, flank pain, frequency, hematuria and urgency.  Musculoskeletal: Negative for back pain, joint pain and myalgias.  Skin: Negative for rash.  Neurological: Negative for dizziness, tingling, focal weakness, seizures, weakness and headaches.  Endo/Heme/Allergies: Does not bruise/bleed easily.  Psychiatric/Behavioral: Negative for depression and suicidal ideas. The patient does not have insomnia.       No Known Allergies   Past Medical History:  Diagnosis Date  . Anxiety   . Asthma   . Cough   . Dyspnea    with exertion  . GERD (gastroesophageal reflux disease)   . Hypertension      Past Surgical History:  Procedure Laterality Date  . BUNIONECTOMY Bilateral   . COLONOSCOPY    . VIDEO BRONCHOSCOPY WITH ENDOBRONCHIAL NAVIGATION N/A 11/07/2020   Procedure: VIDEO BRONCHOSCOPY WITH ENDOBRONCHIAL NAVIGATION;  Surgeon: Ottie Glazier, MD;  Location: [facility] ORS;  Service: Thoracic;  Laterality:  N/A;  . VIDEO BRONCHOSCOPY WITH ENDOBRONCHIAL ULTRASOUND N/A 11/07/2020   Procedure: VIDEO BRONCHOSCOPY WITH ENDOBRONCHIAL ULTRASOUND;   Surgeon: Ottie Glazier, MD;  Location: [facility] ORS;  Service: Thoracic;  Laterality: N/A;    Social History   Socioeconomic History  . Marital status: Married    Spouse name: Not on file  . Number of children: Not on file  . Years of education: Not on file  . Highest education level: Not on file  Occupational History  . Not on file  Tobacco Use  . Smoking status: Former Smoker    Packs/day: 1.00    Years: 15.00    Pack years: 15.00    Types: Cigarettes    Quit date: 11/07/1994    Years since quitting: 26.0  . Smokeless tobacco: Never Used  Vaping Use  . Vaping Use: Never used  Substance and Sexual Activity  . Alcohol use: Yes    Comment:  wine daily  . Drug use: Never  . Sexual activity: Not on file  Other Topics Concern  . Not on file  Social History Narrative  . Not on file   Social Determinants of Health   Financial Resource Strain: Not on file  Food Insecurity: Not on file  Transportation Needs: Not on file  Physical Activity: Not on file  Stress: Not on file  Social Connections: Not on file  Intimate Partner Violence: Not on file    No family history on file.   Current Outpatient Medications:  .  ADVAIR DISKUS 250-50 MCG/DOSE AEPB, Inhale 1 puff into the lungs as needed., Disp: , Rfl:  .  ALPRAZolam (XANAX) 0.5 MG tablet, Take 0.5 mg by mouth at bedtime as needed for sleep., Disp: , Rfl:  .  amLODipine (NORVASC) 5 MG tablet, Take 5 mg by mouth at bedtime., Disp: , Rfl:  .  Cholecalciferol 50 MCG (2000 UT) CAPS, Take 2,000 Units by mouth daily., Disp: , Rfl:  .  esomeprazole (NEXIUM) 20 MG capsule, Take 20 mg by mouth as needed., Disp: , Rfl:  .  fluticasone (FLONASE) 50 MCG/ACT nasal spray, Place 2 sprays into both nostrils daily as needed for rhinitis., Disp: , Rfl:  .  levofloxacin (LEVAQUIN) 500 MG tablet, Take by mouth., Disp: , Rfl:  .  metoprolol succinate (TOPROL-XL) 50 MG 24 hr tablet, Take 50 mg by mouth every morning., Disp: , Rfl:  .  olmesartan  (BENICAR) 40 MG tablet, Take 40 mg by mouth at bedtime., Disp: , Rfl:  .  zolpidem (AMBIEN) 10 MG tablet, Take 10 mg by mouth at bedtime as needed for sleep., Disp: , Rfl:  .  dexamethasone (DECADRON) 4 MG tablet, Take 2 tablets (8 mg total) by mouth daily. Start the day after chemotherapy for 2 days., Disp: 30 tablet, Rfl: 1 .  lidocaine-prilocaine (EMLA) cream, Apply to affected area once, Disp: 30 g, Rfl: 3 .  LORazepam (ATIVAN) 0.5 MG tablet, Take 1 tablet (0.5 mg total) by mouth every 6 (six) hours as needed (Nausea or vomiting)., Disp: 30 tablet, Rfl: 0 .  ondansetron (ZOFRAN) 8 MG tablet, Take 1 tablet (8 mg total) by mouth 2 (two) times daily as needed for refractory nausea / vomiting. Start on day 3 after chemo., Disp: 30 tablet, Rfl: 1 .  prochlorperazine (COMPAZINE) 10 MG tablet, Take 1 tablet (10 mg total) by mouth every 6 (six) hours as needed (Nausea or vomiting)., Disp: 30 tablet, Rfl: 1  Physical exam:  Vitals:   11/21/20 1029  BP: 139/61  Pulse: 87  Temp: 98.4 F (36.9 C)  TempSrc: Tympanic  SpO2: 100%  Weight: 159 lb 12.8 oz (72.5 kg)   Physical Exam Constitutional:      General: She is not in acute distress. Cardiovascular:     Rate and Rhythm: Normal rate and regular rhythm.     Heart sounds: Normal heart sounds.  Pulmonary:     Effort: Pulmonary effort is normal.  Skin:    General: Skin is warm and dry.  Neurological:     Mental Status: She is alert and oriented to person, place, and time.      No flowsheet data found. CBC Latest Ref Rng & Units 11/07/2020  WBC 4.0 - 10.5 K/uL 7.0  Hemoglobin 12.0 - 15.0 g/dL 11.6(L)  Hematocrit 36.0 - 46.0 % 34.7(L)  Platelets 150 - 400 K/uL 295       DG Chest Portable 1 View  Result Date: 11/07/2020 CLINICAL DATA:  Post bronchoscopy EXAM: PORTABLE CHEST 1 VIEW COMPARISON:  None. FINDINGS: The heart size and mediastinal contours are unchanged with right suprahilar nodular airspace opacity. Again noted is  reticulonodular patchy airspace opacities in the right upper lung. No pneumothorax is seen. No pleural effusion. IMPRESSION: Stable right suprahilar and upper lobe airspace opacity/masslike consolidation. No acute cardiopulmonary process. Electronically Signed   By: Prudencio Pair M.D.   On: 11/07/2020 15:11   DG C-Arm 1-60 Min-No Report  Result Date: 11/07/2020 Fluoroscopy was utilized by the requesting physician.  No radiographic interpretation.   CT Super D Chest Wo Contrast  Addendum Date: 11/20/2020   ADDENDUM REPORT: 11/20/2020 13:33 ADDENDUM: In addition to findings outlined in the initial report, there is loss of fat plane medially along the pleural surface adjacent to the RIGHT lower lobe mass, extrapleural fat is effaced on image 90 of series 2 between the mass in the spine. Pleural involvement with extension along the extrapleural fat is considered. Findings were discussed with Dr. Janese Banks, during conference, by me at approximately 1310 hours on 11/20/2020. Electronically Signed   By: Zetta Bills M.D.   On: 11/20/2020 13:33   Result Date: 11/20/2020 CLINICAL DATA:  Preparation for navigational bronchoscopy. EXAM: CT CHEST WITHOUT CONTRAST TECHNIQUE: Multidetector CT imaging of the chest was performed using thin slice collimation for electromagnetic bronchoscopy planning purposes, without intravenous contrast. COMPARISON:  October 02, 2020 and prior PET scan from October 16, 2020 FINDINGS: Cardiovascular: Calcified atheromatous plaque the thoracic aorta. Nonaneurysmal appearance of the thoracic aorta. Normal heart size. Mass/abnormal soft tissue abutting the posterior aspect of the RIGHT border of the heart, posterior LEFT atrium to the RIGHT of midline. Central pulmonary vasculature is normal caliber. Mediastinum/Nodes: Esophagus patulous and inseparable from the masslike area in the medial RIGHT chest on image 90 of series 2. Subcarinal nodal enlargement with similar appearance to recent chest CT,  largest approximately 10 mm. RIGHT paratracheal lymph nodes with mild enlargement similar to prior imaging. Small anterior mediastinal lymph node on image 55 of series 2 unchanged is 6 mm. No supraclavicular adenopathy. No axillary adenopathy. Lungs/Pleura: Large masslike area in the medial RIGHT chest and RIGHT hilar mass with associated distortion of the major fissure, dominant area centered in the superior segment of the RIGHT lower lobe but also involving RIGHT hilum and with extension of ground-glass, bronchiectasis and septal thickening with interstitial thickening into the RIGHT upper lobe and into the RIGHT lower lobe as on previous imaging. Dominant area in axial dimension measuring 6.6 by 3.2 cm  as compared to 7.2 x 2.7 cm. Just below the RIGHT hilum the area measures approximately 5.1 x 5.0 cm, little changed compared to previous imaging Upper lobe findings may be slightly smaller and or less confluent measuring approximately 9 by 4.8 cm as compared to 8.7 x 5.9 cm on the previous exam LEFT lung is clear. Upper Abdomen: Incidental imaging of upper abdominal contents without acute process. Imaged portions of liver, gallbladder, pancreas, spleen, adrenal glands and kidneys are unremarkable. No acute gastrointestinal process to the extent evaluated. Musculoskeletal: Spinal degenerative changes. No acute or destructive bone process. IMPRESSION: 1. Masslike area in the superior segment of the RIGHT lower lobe, medial RIGHT middle lobe and with extension of septal thickening and ground-glass into the RIGHT upper lobe, associated with fissural distortion, little changed aside from slightly less septal thickening and ground-glass in the RIGHT upper lobe compared to previous imaging. 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 2. Top-normal size  of mediastinal lymph nodes, refer to prior PET scan little change from prior imaging. Aortic Atherosclerosis (ICD10-I70.0). Electronically Signed: By: Zetta Bills M.D. On: 11/06/2020 14:48     Assessment and plan- Patient is a 65 y.o. female newly diagnosed adenocarcinoma of the right lung clinical stage III acT3 N1 M0 here to discuss further management  We discussed patient's case at tumor board patient has a right lower lobe lung mass that appears to be invading the pleura.  There is a continuous hypermetabolism which is seen extending from the right upper lobe to the right lower lobe and into the right hilum.  Although right upper lobe cytology brushings were negative for carcinoma given the hypermetabolism on the PET scan there is still an ongoing concern that the right upper lobe may be actually involved.  She has been treated on and off for pneumonia and the hypermetabolism does not seem to have resolve if this was truly an infection.  Also the right hilum appears hypermetabolic on PET CT scan and it is difficult to ascertain if hilar lymph nodes are truly involved or not but the degree of suspicion remains high.  Some of these lymph nodes were sampled and were negative for however given the clinical suspicion we are proceeding as if this were a stage III T3N1 lung cancer.   In terms of pathology as well as is not staining characteristically for given that it was GATA3 positive and TTF-1 negative but based on clinical picture as well as the fact that there could be lung cancers which could be GATA3 positive in the absence of any inconclusive breast findings-this would be treated as a stage III lung cancer  Discussed with the patient that I can still send her for surgical opinion with the likelihood of her needing a pneumonectomy is high and the likelihood of finding positive lymph nodes at the time of surgery would be high.  Even if patient would be to go to such as surgery she would in high  likelihood need postoperative concurrent chemoradiation and a trimodality treatment would come at a high risk of toxicity.  It would therefore be in her best interest to proceed with concurrent chemoradiation alone without a surgical opinion.  I recommend 7 weekly cycles of CarboTaxol given with concurrent chemoradiation followed by repeat scans.  If scans show partial response she will proceed with maintenance durvalumab at that time.  Discussed risks and benefits  of chemotherapy including all but not limited to nausea, vomiting, low blood counts, risk of infections and hospitalization.  Risk of peripheral neuropathy and infusion reaction associated with Taxol.  Treatment will be given with a curative intent.  Patient understands and agrees to proceed as planned.  Patient will need port placement and chemo teach for CarboTaxol and I will tentatively see her in 10 days time to start first cycle of CarboTaxol chemotherapy.  Patient and her husband verbalized understanding  Cancer Staging Malignant neoplasm of lower lobe of right lung [facility]) Staging form: Lung, AJCC 8th Edition - Clinical stage from 11/20/2020: Stage IIIA (cT3, cN1, cM0) - Signed by Sindy Guadeloupe, MD on 11/23/2020     Visit Diagnosis 1. Malignant neoplasm of lower lobe of right lung ([place])   2. Goals of care, counseling/discussion      Dr. Randa Evens, MD, MPH [facility] [phone] 11/23/2020 11:27 AM

## 2020-11-23 NOTE — Progress Notes (Signed)
START ON PATHWAY REGIMEN - Non-Small Cell Lung     Administer weekly:     Paclitaxel      Carboplatin   **Always confirm dose/schedule in your pharmacy ordering system**  Patient Characteristics: Preoperative or Nonsurgical Candidate (Clinical Staging), Stage III - Nonsurgical Candidate (Nonsquamous and Squamous), PS = 0, 1 Therapeutic Status: Preoperative or Nonsurgical Candidate (Clinical Staging) AJCC T Category: cT3 AJCC N Category: cN1 AJCC M Category: cM0 AJCC 8 Stage Grouping: IIIA ECOG Performance Status: 1 Intent of Therapy: Curative Intent, Discussed with Patient

## 2020-11-26 ENCOUNTER — Telehealth: Payer: Self-pay | Admitting: Oncology

## 2020-11-26 ENCOUNTER — Ambulatory Visit: Payer: BC Managed Care – PPO

## 2020-11-26 NOTE — Telephone Encounter (Signed)
Spoke with patient about changes made to her appointments due to her testing positive for COVID 19. (Per MD-move everything out by 2 wks).  Reviewed and confirmed new dates and times and patient was agreeable.

## 2020-11-26 NOTE — Telephone Encounter (Signed)
Pt just called to report that she tested postivie for Covid yesterday from a home test. She would like to know what she needs to do about her appts. Secure chat sent to Dr. Janese Banks and Dr. Olena Leatherwood teams for appt changes.

## 2020-11-28 ENCOUNTER — Ambulatory Visit: Payer: BC Managed Care – PPO

## 2020-11-29 LAB — CULTURE, FUNGUS WITHOUT SMEAR

## 2020-12-02 ENCOUNTER — Other Ambulatory Visit: Payer: BC Managed Care – PPO

## 2020-12-03 ENCOUNTER — Inpatient Hospital Stay: Payer: BC Managed Care – PPO | Attending: Oncology | Admitting: Hospice and Palliative Medicine

## 2020-12-03 ENCOUNTER — Ambulatory Visit: Payer: BC Managed Care – PPO | Admitting: Radiation Oncology

## 2020-12-03 ENCOUNTER — Ambulatory Visit: Payer: BC Managed Care – PPO

## 2020-12-03 DIAGNOSIS — C3431 Malignant neoplasm of lower lobe, right bronchus or lung: Secondary | ICD-10-CM

## 2020-12-03 DIAGNOSIS — E871 Hypo-osmolality and hyponatremia: Secondary | ICD-10-CM | POA: Insufficient documentation

## 2020-12-03 DIAGNOSIS — Z5111 Encounter for antineoplastic chemotherapy: Secondary | ICD-10-CM | POA: Insufficient documentation

## 2020-12-03 DIAGNOSIS — Z87891 Personal history of nicotine dependence: Secondary | ICD-10-CM | POA: Insufficient documentation

## 2020-12-03 NOTE — Progress Notes (Signed)
Multidisciplinary Oncology Council Documentation  LIZANN EDELMAN was presented by our [place] on 12/03/2020, which included representatives from:  . Palliative Care . Dietitian  . Physical/Occupational Therapist . Nurse Navigator . Genetics . Speech Therapist . Social work . Survivorship RN . Hotel manager . Research RN . [place] currently presents with history of stage III lung cancer  We reviewed previous medical and familial history, history of present illness, and recent lab results along with all available histopathologic and imaging studies. The [place] considered available treatment options and made the following recommendations/referrals:  Consider rehab screening for fatigue/weakness, nutrition consults  The MOC is a meeting of clinicians from various specialty areas who evaluate and discuss patients for whom a multidisciplinary approach is being considered. Final determinations in the plan of care are those of the provider(s).   Today's extended care, comprehensive team conference, [place] was not present for the discussion and was not examined.

## 2020-12-03 NOTE — Addendum Note (Signed)
Addended by: Telford Nab on: 12/03/2020 03:19 PM   Modules accepted: Orders

## 2020-12-04 ENCOUNTER — Encounter: Payer: BC Managed Care – PPO | Admitting: Thoracic Surgery (Cardiothoracic Vascular Surgery)

## 2020-12-04 ENCOUNTER — Ambulatory Visit: Payer: BC Managed Care – PPO

## 2020-12-05 ENCOUNTER — Ambulatory Visit: Payer: BC Managed Care – PPO

## 2020-12-05 ENCOUNTER — Ambulatory Visit: Payer: BC Managed Care – PPO | Admitting: Oncology

## 2020-12-05 ENCOUNTER — Other Ambulatory Visit: Payer: BC Managed Care – PPO

## 2020-12-08 ENCOUNTER — Encounter: Payer: Self-pay | Admitting: *Deleted

## 2020-12-08 ENCOUNTER — Ambulatory Visit: Payer: BC Managed Care – PPO

## 2020-12-08 ENCOUNTER — Ambulatory Visit
Admission: RE | Admit: 2020-12-08 | Discharge: 2020-12-08 | Disposition: A | Discharge status: Home / Self Care | Payer: BC Managed Care – PPO | Source: Ambulatory Visit | Attending: Radiation Oncology | Admitting: Radiation Oncology

## 2020-12-08 DIAGNOSIS — Z510 Encounter for antineoplastic radiation therapy: Secondary | ICD-10-CM | POA: Insufficient documentation

## 2020-12-08 DIAGNOSIS — C3431 Malignant neoplasm of lower lobe, right bronchus or lung: Secondary | ICD-10-CM | POA: Insufficient documentation

## 2020-12-09 ENCOUNTER — Ambulatory Visit: Payer: BC Managed Care – PPO

## 2020-12-10 ENCOUNTER — Ambulatory Visit: Payer: BC Managed Care – PPO

## 2020-12-10 NOTE — Patient Instructions (Signed)
Paclitaxel injection What is this medicine? PACLITAXEL (PAK li TAX el) is a chemotherapy drug. It targets fast dividing cells, like cancer cells, and causes these cells to die. This medicine is used to treat ovarian cancer, breast cancer, lung cancer, Kaposi's sarcoma, and other cancers. This medicine may be used for other purposes; ask your health care provider or pharmacist if you have questions. COMMON BRAND NAME(S): Onxol, Taxol What should I tell my health care provider before I take this medicine? They need to know if you have any of these conditions:  history of irregular heartbeat  liver disease  low blood counts, like low white cell, platelet, or red cell counts  lung or breathing disease, like asthma  tingling of the fingers or toes, or other nerve disorder  an unusual or allergic reaction to paclitaxel, alcohol, polyoxyethylated castor oil, other chemotherapy, other medicines, foods, dyes, or preservatives  pregnant or trying to get pregnant  breast-feeding How should I use this medicine? This drug is given as an infusion into a vein. It is administered in a hospital or clinic by a specially trained health care professional. Talk to your pediatrician regarding the use of this medicine in children. Special care may be needed. Overdosage: If you think you have taken too much of this medicine contact a poison control center or emergency room at once. NOTE: This medicine is only for you. Do not share this medicine with others. What if I miss a dose? It is important not to miss your dose. Call your doctor or health care professional if you are unable to keep an appointment. What may interact with this medicine? Do not take this medicine with any of the following medications:  live virus vaccines This medicine may also interact with the following medications:  antiviral medicines for hepatitis, HIV or AIDS  certain antibiotics like erythromycin and clarithromycin  certain  medicines for fungal infections like ketoconazole and itraconazole  certain medicines for seizures like carbamazepine, phenobarbital, phenytoin  gemfibrozil  nefazodone  rifampin  St. John's wort This list may not describe all possible interactions. Give your health care provider a list of all the medicines, herbs, non-prescription drugs, or dietary supplements you use. Also tell them if you smoke, drink alcohol, or use illegal drugs. Some items may interact with your medicine. What should I watch for while using this medicine? Your condition will be monitored carefully while you are receiving this medicine. You will need important blood work done while you are taking this medicine. This medicine can cause serious allergic reactions. To reduce your risk you will need to take other medicine(s) before treatment with this medicine. If you experience allergic reactions like skin rash, itching or hives, swelling of the face, lips, or tongue, tell your doctor or health care professional right away. In some cases, you may be given additional medicines to help with side effects. Follow all directions for their use. This drug may make you feel generally unwell. This is not uncommon, as chemotherapy can affect healthy cells as well as cancer cells. Report any side effects. Continue your course of treatment even though you feel ill unless your doctor tells you to stop. Call your doctor or health care professional for advice if you get a fever, chills or sore throat, or other symptoms of a cold or flu. Do not treat yourself. This drug decreases your body's ability to fight infections. Try to avoid being around people who are sick. This medicine may increase your risk to bruise  or bleed. Call your doctor or health care professional if you notice any unusual bleeding. Be careful brushing and flossing your teeth or using a toothpick because you may get an infection or bleed more easily. If you have any dental  work done, tell your dentist you are receiving this medicine. Avoid taking products that contain aspirin, acetaminophen, ibuprofen, naproxen, or ketoprofen unless instructed by your doctor. These medicines may hide a fever. Do not become pregnant while taking this medicine. Women should inform their doctor if they wish to become pregnant or think they might be pregnant. There is a potential for serious side effects to an unborn child. Talk to your health care professional or pharmacist for more information. Do not breast-feed an infant while taking this medicine. Men are advised not to father a child while receiving this medicine. This product may contain alcohol. Ask your pharmacist or healthcare provider if this medicine contains alcohol. Be sure to tell all healthcare providers you are taking this medicine. Certain medicines, like metronidazole and disulfiram, can cause an unpleasant reaction when taken with alcohol. The reaction includes flushing, headache, nausea, vomiting, sweating, and increased thirst. The reaction can last from 30 minutes to several hours. What side effects may I notice from receiving this medicine? Side effects that you should report to your doctor or health care professional as soon as possible:  allergic reactions like skin rash, itching or hives, swelling of the face, lips, or tongue  breathing problems  changes in vision  fast, irregular heartbeat  high or low blood pressure  mouth sores  pain, tingling, numbness in the hands or feet  signs of decreased platelets or bleeding - bruising, pinpoint red spots on the skin, black, tarry stools, blood in the urine  signs of decreased red blood cells - unusually weak or tired, feeling faint or lightheaded, falls  signs of infection - fever or chills, cough, sore throat, pain or difficulty passing urine  signs and symptoms of liver injury like dark yellow or brown urine; general ill feeling or flu-like symptoms;  light-colored stools; loss of appetite; nausea; right upper belly pain; unusually weak or tired; yellowing of the eyes or skin  swelling of the ankles, feet, hands  unusually slow heartbeat Side effects that usually do not require medical attention (report to your doctor or health care professional if they continue or are bothersome):  diarrhea  hair loss  loss of appetite  muscle or joint pain  nausea, vomiting  pain, redness, or irritation at site where injected  tiredness This list may not describe all possible side effects. Call your doctor for medical advice about side effects. You may report side effects to FDA at 1-800-FDA-1088. Where should I keep my medicine? This drug is given in a hospital or clinic and will not be stored at home. NOTE: This sheet is a summary. It may not cover all possible information. If you have questions about this medicine, talk to your doctor, pharmacist, or health care provider.  2021 Elsevier/Gold Standard (2019-06-20 13:37:23) Carboplatin injection What is this medicine? CARBOPLATIN (KAR boe pla tin) is a chemotherapy drug. It targets fast dividing cells, like cancer cells, and causes these cells to die. This medicine is used to treat ovarian cancer and many other cancers. This medicine may be used for other purposes; ask your health care provider or pharmacist if you have questions. COMMON BRAND NAME(S): Paraplatin What should I tell my health care provider before I take this medicine? They need to  know if you have any of these conditions:  blood disorders  hearing problems  kidney disease  recent or ongoing radiation therapy  an unusual or allergic reaction to carboplatin, cisplatin, other chemotherapy, other medicines, foods, dyes, or preservatives  pregnant or trying to get pregnant  breast-feeding How should I use this medicine? This drug is usually given as an infusion into a vein. It is administered in a hospital or clinic by  a specially trained health care professional. Talk to your pediatrician regarding the use of this medicine in children. Special care may be needed. Overdosage: If you think you have taken too much of this medicine contact a poison control center or emergency room at once. NOTE: This medicine is only for you. Do not share this medicine with others. What if I miss a dose? It is important not to miss a dose. Call your doctor or health care professional if you are unable to keep an appointment. What may interact with this medicine?  medicines for seizures  medicines to increase blood counts like filgrastim, pegfilgrastim, sargramostim  some antibiotics like amikacin, gentamicin, neomycin, streptomycin, tobramycin  vaccines Talk to your doctor or health care professional before taking any of these medicines:  acetaminophen  aspirin  ibuprofen  ketoprofen  naproxen This list may not describe all possible interactions. Give your health care provider a list of all the medicines, herbs, non-prescription drugs, or dietary supplements you use. Also tell them if you smoke, drink alcohol, or use illegal drugs. Some items may interact with your medicine. What should I watch for while using this medicine? Your condition will be monitored carefully while you are receiving this medicine. You will need important blood work done while you are taking this medicine. This drug may make you feel generally unwell. This is not uncommon, as chemotherapy can affect healthy cells as well as cancer cells. Report any side effects. Continue your course of treatment even though you feel ill unless your doctor tells you to stop. In some cases, you may be given additional medicines to help with side effects. Follow all directions for their use. Call your doctor or health care professional for advice if you get a fever, chills or sore throat, or other symptoms of a cold or flu. Do not treat yourself. This drug decreases  your body's ability to fight infections. Try to avoid being around people who are sick. This medicine may increase your risk to bruise or bleed. Call your doctor or health care professional if you notice any unusual bleeding. Be careful brushing and flossing your teeth or using a toothpick because you may get an infection or bleed more easily. If you have any dental work done, tell your dentist you are receiving this medicine. Avoid taking products that contain aspirin, acetaminophen, ibuprofen, naproxen, or ketoprofen unless instructed by your doctor. These medicines may hide a fever. Do not become pregnant while taking this medicine. Women should inform their doctor if they wish to become pregnant or think they might be pregnant. There is a potential for serious side effects to an unborn child. Talk to your health care professional or pharmacist for more information. Do not breast-feed an infant while taking this medicine. What side effects may I notice from receiving this medicine? Side effects that you should report to your doctor or health care professional as soon as possible:  allergic reactions like skin rash, itching or hives, swelling of the face, lips, or tongue  signs of infection - fever or  chills, cough, sore throat, pain or difficulty passing urine  signs of decreased platelets or bleeding - bruising, pinpoint red spots on the skin, black, tarry stools, nosebleeds  signs of decreased red blood cells - unusually weak or tired, fainting spells, lightheadedness  breathing problems  changes in hearing  changes in vision  chest pain  high blood pressure  low blood counts - This drug may decrease the number of white blood cells, red blood cells and platelets. You may be at increased risk for infections and bleeding.  nausea and vomiting  pain, swelling, redness or irritation at the injection site  pain, tingling, numbness in the hands or feet  problems with balance,  talking, walking  trouble passing urine or change in the amount of urine Side effects that usually do not require medical attention (report to your doctor or health care professional if they continue or are bothersome):  hair loss  loss of appetite  metallic taste in the mouth or changes in taste This list may not describe all possible side effects. Call your doctor for medical advice about side effects. You may report side effects to FDA at 1-800-FDA-1088. Where should I keep my medicine? This drug is given in a hospital or clinic and will not be stored at home. NOTE: This sheet is a summary. It may not cover all possible information. If you have questions about this medicine, talk to your doctor, pharmacist, or health care provider.  2021 Elsevier/Gold Standard (2007-10-24 14:38:05)

## 2020-12-11 ENCOUNTER — Other Ambulatory Visit: Payer: Self-pay

## 2020-12-11 ENCOUNTER — Ambulatory Visit: Payer: BC Managed Care – PPO

## 2020-12-11 ENCOUNTER — Inpatient Hospital Stay: Payer: BC Managed Care – PPO

## 2020-12-11 DIAGNOSIS — Z510 Encounter for antineoplastic radiation therapy: Secondary | ICD-10-CM | POA: Diagnosis not present

## 2020-12-12 ENCOUNTER — Other Ambulatory Visit: Payer: Self-pay

## 2020-12-12 ENCOUNTER — Ambulatory Visit
Admission: RE | Admit: 2020-12-12 | Discharge: 2020-12-12 | Disposition: A | Discharge status: Home / Self Care | Payer: BC Managed Care – PPO | Source: Ambulatory Visit | Attending: Oncology | Admitting: Oncology

## 2020-12-12 ENCOUNTER — Ambulatory Visit: Payer: BC Managed Care – PPO

## 2020-12-12 DIAGNOSIS — C3490 Malignant neoplasm of unspecified part of unspecified bronchus or lung: Secondary | ICD-10-CM | POA: Diagnosis present

## 2020-12-12 MED ORDER — GADOBUTROL 1 MMOL/ML IV SOLN
7.0000 mL | Freq: Once | INTRAVENOUS | Status: AC | PRN
  Administered 2020-12-12: 7 mL via INTRAVENOUS

## 2020-12-15 ENCOUNTER — Ambulatory Visit: Admission: RE | Admit: 2020-12-15 | Payer: BC Managed Care – PPO | Source: Ambulatory Visit

## 2020-12-15 ENCOUNTER — Ambulatory Visit: Payer: BC Managed Care – PPO

## 2020-12-15 ENCOUNTER — Other Ambulatory Visit: Payer: Self-pay

## 2020-12-16 ENCOUNTER — Telehealth: Payer: Self-pay | Admitting: Oncology

## 2020-12-16 ENCOUNTER — Encounter: Payer: Self-pay | Admitting: *Deleted

## 2020-12-16 ENCOUNTER — Ambulatory Visit
Admission: RE | Admit: 2020-12-16 | Discharge: 2020-12-16 | Disposition: A | Discharge status: Home / Self Care | Payer: BC Managed Care – PPO | Source: Ambulatory Visit | Attending: Radiation Oncology | Admitting: Radiation Oncology

## 2020-12-16 ENCOUNTER — Encounter: Payer: Self-pay | Admitting: Oncology

## 2020-12-16 ENCOUNTER — Telehealth (INDEPENDENT_AMBULATORY_CARE_PROVIDER_SITE_OTHER): Payer: Self-pay

## 2020-12-16 ENCOUNTER — Inpatient Hospital Stay: Payer: BC Managed Care – PPO

## 2020-12-16 ENCOUNTER — Inpatient Hospital Stay (HOSPITAL_BASED_OUTPATIENT_CLINIC_OR_DEPARTMENT_OTHER): Payer: BC Managed Care – PPO | Admitting: Oncology

## 2020-12-16 ENCOUNTER — Ambulatory Visit: Payer: BC Managed Care – PPO

## 2020-12-16 VITALS — BP 147/72 | HR 94 | Temp 98.0°F | Resp 20 | Wt 159.6 lb

## 2020-12-16 VITALS — BP 153/75 | HR 75 | Resp 16

## 2020-12-16 DIAGNOSIS — Z5111 Encounter for antineoplastic chemotherapy: Secondary | ICD-10-CM | POA: Diagnosis not present

## 2020-12-16 DIAGNOSIS — C3431 Malignant neoplasm of lower lobe, right bronchus or lung: Secondary | ICD-10-CM

## 2020-12-16 DIAGNOSIS — Z87891 Personal history of nicotine dependence: Secondary | ICD-10-CM | POA: Diagnosis not present

## 2020-12-16 DIAGNOSIS — E871 Hypo-osmolality and hyponatremia: Secondary | ICD-10-CM

## 2020-12-16 DIAGNOSIS — Z510 Encounter for antineoplastic radiation therapy: Secondary | ICD-10-CM | POA: Diagnosis not present

## 2020-12-16 LAB — CBC WITH DIFFERENTIAL/PLATELET
Abs Immature Granulocytes: 0.02 10*3/uL (ref 0.00–0.07)
Basophils Absolute: 0 10*3/uL (ref 0.0–0.1)
Basophils Relative: 0 %
Eosinophils Absolute: 0.1 10*3/uL (ref 0.0–0.5)
Eosinophils Relative: 2 %
HCT: 36.2 % (ref 36.0–46.0)
Hemoglobin: 12.4 g/dL (ref 12.0–15.0)
Immature Granulocytes: 0 %
Lymphocytes Relative: 27 %
Lymphs Abs: 1.6 10*3/uL (ref 0.7–4.0)
MCH: 33.1 pg (ref 26.0–34.0)
MCHC: 34.3 g/dL (ref 30.0–36.0)
MCV: 96.5 fL (ref 80.0–100.0)
Monocytes Absolute: 0.5 10*3/uL (ref 0.1–1.0)
Monocytes Relative: 9 %
Neutro Abs: 3.6 10*3/uL (ref 1.7–7.7)
Neutrophils Relative %: 62 %
Platelets: 319 10*3/uL (ref 150–400)
RBC: 3.75 MIL/uL — ABNORMAL LOW (ref 3.87–5.11)
RDW: 12.8 % (ref 11.5–15.5)
WBC: 5.9 10*3/uL (ref 4.0–10.5)
nRBC: 0 % (ref 0.0–0.2)

## 2020-12-16 LAB — COMPREHENSIVE METABOLIC PANEL
ALT: 14 U/L (ref 0–44)
AST: 23 U/L (ref 15–41)
Albumin: 4.1 g/dL (ref 3.5–5.0)
Alkaline Phosphatase: 69 U/L (ref 38–126)
Anion gap: 12 (ref 5–15)
BUN: 10 mg/dL (ref 8–23)
CO2: 23 mmol/L (ref 22–32)
Calcium: 9.3 mg/dL (ref 8.9–10.3)
Chloride: 91 mmol/L — ABNORMAL LOW (ref 98–111)
Creatinine, Ser: 0.64 mg/dL (ref 0.44–1.00)
GFR, Estimated: >60 mL/min (ref >60)
Glucose, Bld: 99 mg/dL (ref 70–99)
Potassium: 5.1 mmol/L (ref 3.5–5.1)
Sodium: 126 mmol/L — ABNORMAL LOW (ref 135–145)
Total Bilirubin: 0.5 mg/dL (ref 0.3–1.2)
Total Protein: 8.2 g/dL — ABNORMAL HIGH (ref 6.5–8.1)

## 2020-12-16 MED ORDER — FAMOTIDINE 20 MG IN NS 100 ML IVPB
20.0000 mg | Freq: Once | INTRAVENOUS | Status: AC
  Administered 2020-12-16: 20 mg via INTRAVENOUS
  Filled 2020-12-16: qty 20

## 2020-12-16 MED ORDER — SODIUM CHLORIDE 0.9 % IV SOLN
Freq: Once | INTRAVENOUS | Status: AC
  Filled 2020-12-16: qty 250

## 2020-12-16 MED ORDER — PALONOSETRON HCL INJECTION 0.25 MG/5ML
0.2500 mg | Freq: Once | INTRAVENOUS | Status: AC
  Administered 2020-12-16: 0.25 mg via INTRAVENOUS
  Filled 2020-12-16: qty 5

## 2020-12-16 MED ORDER — DIPHENHYDRAMINE HCL 50 MG/ML IJ SOLN
50.0000 mg | Freq: Once | INTRAMUSCULAR | Status: AC
  Administered 2020-12-16: 50 mg via INTRAVENOUS
  Filled 2020-12-16: qty 1

## 2020-12-16 MED ORDER — SODIUM CHLORIDE 0.9% FLUSH
10.0000 mL | INTRAVENOUS | Status: DC | PRN
  Filled 2020-12-16: qty 10

## 2020-12-16 MED ORDER — SODIUM CHLORIDE 0.9% FLUSH
10.0000 mL | Freq: Once | INTRAVENOUS | Status: AC
Start: 2020-12-16 — End: ?
  Filled 2020-12-16: qty 10

## 2020-12-16 MED ORDER — SODIUM CHLORIDE 0.9 % IV SOLN
20.0000 mg | Freq: Once | INTRAVENOUS | Status: AC
  Administered 2020-12-16: 20 mg via INTRAVENOUS
  Filled 2020-12-16: qty 20

## 2020-12-16 MED ORDER — SODIUM CHLORIDE 0.9 % IV SOLN
45.0000 mg/m2 | Freq: Once | INTRAVENOUS | Status: AC
  Administered 2020-12-16: 84 mg via INTRAVENOUS
  Filled 2020-12-16: qty 14

## 2020-12-16 MED ORDER — SODIUM CHLORIDE 0.9 % IV SOLN
Freq: Once | INTRAVENOUS | Status: AC
Start: 2020-12-16 — End: 2020-12-16
  Filled 2020-12-16: qty 250

## 2020-12-16 MED ORDER — SODIUM CHLORIDE 0.9 % IV SOLN
214.8000 mg | Freq: Once | INTRAVENOUS | Status: AC
  Administered 2020-12-16: 210 mg via INTRAVENOUS
  Filled 2020-12-16: qty 21

## 2020-12-16 NOTE — Telephone Encounter (Signed)
Patient returned my call and is scheduled with Dr. Lucky Cowboy for a port placement on 12/22/20 with a 9:15 am arrival to the [facility]. Pre-procedure instructions were discussed and will be mailed.

## 2020-12-16 NOTE — Progress Notes (Addendum)
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 She quit smoking about 26 years ago. Other medical problems include hypertension and hyponatremia.She has been treated for iron of possible pneumonia for the last 1 year. She has a history of intermittent asthma for which she uses Advair. She was seen by Dr. And underwent CT chest which showed dense infiltrate/solid mass in the right lower lobe with contiguous airspace opacity in the right upper lobe. Findings could be secondary to pneumonia but concerning for bronchogenic carcinoma. Patient then had a PET CT scan which showed consolidation in the medial aspect of the right lower lobe measuring 3.4 x 4 cm with an SUV of 11.6. Hypermetabolic masslike thickening in the right hilum measuring 2.2 cm with intense hypermetabolic activity. Groundglass airspace consolidation in the posterior aspect of the right upper lobe with an SUV of 11.4. The right upper lobe opacity was nonspecific and differentials include pulmonary infection versus lymphangitic spread of carcinoma.CT chest showed showed masslike area of distortion involving right lower lobe middle lobe as well as right upper lobe. Generalized right hilar masslike appearance concerning for nodal involvement.  Patient underwent bronchoscopy and biopsies.Right lower lobe was concerning for adenocarcinoma. Lymph node  station 11 R, 4R, 7 were negative for malignancy. No malignant cells identified in the right upper lobe.  Case was discussed at tumor board and given the hypermetabolism in the right upper lobe as well as hilum involvement cannot be ruled out despite negative biopsies.  We are therefore proceeding with her having stage III T3N1 disease with concurrent chemoradiation with weekly CarboTaxol   Interval history-reports occasional dry cough and some exertional shortness of breath.  Denies any other complaints at this time  ECOG PS- 1 Pain scale- 0 Opioid associated constipation- no  Review of systems- Review of Systems  Constitutional: Positive for malaise/fatigue. Negative for chills, fever and weight loss.  HENT: Negative for congestion, ear discharge and nosebleeds.   Eyes: Negative for blurred vision.  Respiratory: Positive for cough. Negative for hemoptysis, sputum production, shortness of breath and wheezing.   Cardiovascular: Negative for chest pain, palpitations, orthopnea and claudication.  Gastrointestinal: Negative for abdominal pain, blood in stool, constipation, diarrhea, heartburn, melena, nausea and vomiting.  Genitourinary: Negative for dysuria, flank pain, frequency, hematuria and urgency.  Musculoskeletal: Negative for back pain, joint pain and myalgias.  Skin: Negative for rash.  Neurological: Negative for dizziness, tingling, focal weakness, seizures, weakness and headaches.  Endo/Heme/Allergies: Does not bruise/bleed easily.  Psychiatric/Behavioral: Negative for depression and suicidal ideas. The patient does not have insomnia.        No Known Allergies   Past Medical History:  Diagnosis Date  . Anxiety   . Asthma   . Cough   . Dyspnea    with exertion  . GERD (gastroesophageal reflux disease)   . Hypertension      Past Surgical History:  Procedure Laterality Date  . BUNIONECTOMY Bilateral   . COLONOSCOPY    .  VIDEO BRONCHOSCOPY WITH ENDOBRONCHIAL  NAVIGATION N/A 11/07/2020   Procedure: VIDEO BRONCHOSCOPY WITH ENDOBRONCHIAL NAVIGATION;  Surgeon: Ottie Glazier, MD;  Location: [facility] ORS;  Service: Thoracic;  Laterality: N/A;  . VIDEO BRONCHOSCOPY WITH ENDOBRONCHIAL ULTRASOUND N/A 11/07/2020   Procedure: VIDEO BRONCHOSCOPY WITH ENDOBRONCHIAL ULTRASOUND;  Surgeon: Ottie Glazier, MD;  Location: [facility] ORS;  Service: Thoracic;  Laterality: N/A;    Social History   Socioeconomic History  . Marital status: Married    Spouse name: Not on file  . Number of children: Not on file  . Years of education: Not on file  . Highest education level: Not on file  Occupational History  . Not on file  Tobacco Use  . Smoking status: Former Smoker    Packs/day: 1.00    Years: 15.00    Pack years: 15.00    Types: Cigarettes    Quit date: 11/07/1994    Years since quitting: 26.1  . Smokeless tobacco: Never Used  Vaping Use  . Vaping Use: Never used  Substance and Sexual Activity  . Alcohol use: Yes    Comment:  wine daily  . Drug use: Never  . Sexual activity: Not on file  Other Topics Concern  . Not on file  Social History Narrative  . Not on file   Social Determinants of Health   Financial Resource Strain: Not on file  Food Insecurity: Not on file  Transportation Needs: Not on file  Physical Activity: Not on file  Stress: Not on file  Social Connections: Not on file  Intimate Partner Violence: Not on file    History reviewed. No pertinent family history.   Current Outpatient Medications:  .  ADVAIR DISKUS 250-50 MCG/DOSE AEPB, Inhale 1 puff into the lungs as needed., Disp: , Rfl:  .  ALPRAZolam (XANAX) 0.5 MG tablet, Take 0.5 mg by mouth at bedtime as needed for sleep., Disp: , Rfl:  .  amLODipine (NORVASC) 5 MG tablet, Take 5 mg by mouth at bedtime., Disp: , Rfl:  .  Cholecalciferol 50 MCG (2000 UT) CAPS, Take 2,000 Units by mouth daily., Disp: , Rfl:  .  dexamethasone (DECADRON) 4 MG tablet, Take 2 tablets (8 mg total) by mouth  daily. Start the day after chemotherapy for 2 days., Disp: 30 tablet, Rfl: 1 .  esomeprazole (NEXIUM) 20 MG capsule, Take 20 mg by mouth as needed., Disp: , Rfl:  .  fluticasone (FLONASE) 50 MCG/ACT nasal spray, Place 2 sprays into both nostrils daily as needed for rhinitis., Disp: , Rfl:  .  lidocaine-prilocaine (EMLA) cream, Apply to affected area once, Disp: 30 g, Rfl: 3 .  LORazepam (ATIVAN) 0.5 MG tablet, Take 1 tablet (0.5 mg total) by mouth every 6 (six) hours as needed (Nausea or vomiting)., Disp: 30 tablet, Rfl: 0 .  metoprolol succinate (TOPROL-XL) 50 MG 24 hr tablet, Take 50 mg by mouth every morning., Disp: , Rfl:  .  olmesartan (BENICAR) 40 MG tablet, Take 40 mg by mouth at bedtime., Disp: , Rfl:  .  ondansetron (ZOFRAN) 8 MG tablet, Take 1 tablet (8 mg total) by mouth 2 (two) times daily as needed for refractory nausea / vomiting. Start on day 3 after chemo., Disp: 30 tablet, Rfl: 1 .  prochlorperazine (COMPAZINE) 10 MG tablet, Take 1 tablet (10 mg total) by mouth every 6 (six) hours as needed (Nausea or vomiting)., Disp: 30 tablet, Rfl: 1 .  zolpidem (AMBIEN) 10 MG tablet, Take 10 mg by mouth at bedtime as needed  for sleep., Disp: , Rfl:  No current facility-administered medications for this visit.  Facility-Administered Medications Ordered in Other Visits:  .  CARBOplatin (PARAPLATIN) 210 mg in sodium chloride 0.9 % 250 mL chemo infusion, 210 mg, Intravenous, Once, Sindy Guadeloupe, MD .  dexamethasone (DECADRON) 20 mg in sodium chloride 0.9 % 50 mL IVPB, 20 mg, Intravenous, Once, Sindy Guadeloupe, MD .  famotidine (PEPCID) IVPB 20 mg in NS 100 mL IVPB, 20 mg, Intravenous, Once, Sindy Guadeloupe, MD .  PACLitaxel (TAXOL) 84 mg in sodium chloride 0.9 % 250 mL chemo infusion (</= 80mg /m2), 45 mg/m2 (Treatment Plan Recorded), Intravenous, Once, Sindy Guadeloupe, MD .  sodium chloride flush (NS) 0.9 % injection 10 mL, 10 mL, Intravenous, Once, Sindy Guadeloupe, MD .  sodium chloride flush (NS)  0.9 % injection 10 mL, 10 mL, Intracatheter, PRN, Sindy Guadeloupe, MD  Physical exam:  Vitals:   12/16/20 0920  BP: (!) 147/72  Pulse: 94  Resp: 20  Temp: 98 F (36.7 C)  TempSrc: Tympanic  SpO2: 100%  Weight: 159 lb 9.6 oz (72.4 kg)   Physical Exam Cardiovascular:     Rate and Rhythm: Normal rate and regular rhythm.     Heart sounds: Normal heart sounds.  Pulmonary:     Effort: Pulmonary effort is normal.     Breath sounds: Normal breath sounds.  Abdominal:     General: Bowel sounds are normal.     Palpations: Abdomen is soft.  Skin:    General: Skin is warm and dry.  Neurological:     Mental Status: She is alert and oriented to person, place, and time.      No flowsheet data found. CBC Latest Ref Rng & Units 12/16/2020  WBC 4.0 - 10.5 K/uL 5.9  Hemoglobin 12.0 - 15.0 g/dL 12.4  Hematocrit 36.0 - 46.0 % 36.2  Platelets 150 - 400 K/uL 319    No images are attached to the encounter.  MR Brain W Wo Contrast  Result Date: 12/13/2020 CLINICAL DATA:  Non-small cell lung cancer staging EXAM: MRI HEAD WITHOUT AND WITH CONTRAST TECHNIQUE: Multiplanar, multiecho pulse sequences of the brain and surrounding structures were obtained without and with intravenous contrast. CONTRAST:  56mL GADAVIST GADOBUTROL 1 MMOL/ML IV SOLN COMPARISON:  None. FINDINGS: Brain: No swelling or enhancement to suggest metastatic disease. Mild chronic small vessel ischemic type change in the cerebral white matter. No incidental infarct, hemorrhage, hydrocephalus, or collection. Vascular: Normal flow voids and vascular enhancements Skull and upper cervical spine: Normal marrow signal. Scattered areas of susceptibility artifact along the scalp. Sinuses/Orbits: Negative. IMPRESSION: No evidence of intracranial metastasis. Electronically Signed   By: Monte Fantasia M.D.   On: 12/13/2020 06:23     Assessment and plan- Patient is a 65 y.o. female with adenocarcinoma of the right lung clinical stage III acT3 N1  M0.  She is here for on treatment assessment prior to cycle 1 of CarboTaxol chemotherapy concurrent with radiation  Treatment start was delayed as patient came down with COVID.  He has now recovered well and is 2 weeks out of her infection.  Counts are okay to proceed with cycle 1 of CarboTaxol chemotherapy which will be given on a weekly basis along with radiation.  I will see her back in 1 week with labs for cycle 2.  Plan is to do 7 weeks of concurrent chemoradiation followed by repeat scans and if patient has partial response/stable disease will proceed to maintenance  immunotherapy.  Discussed risks and benefits of CarboTaxol chemotherapy including all but not limited to nausea, vomiting, low blood counts, risk of infections and hospitalization and risk of infusion reaction associated with Taxol.  Treatment will be given with a curative intent.  Patient understands and agrees to proceed as planned.  Patient has not had a port placement yet and we will plan to get that done in the next 1 week.  Baseline MRI brain was negative for metastatic disease  Hyponatremia: We will add 1 L of IV fluids to her treatment regimen today   Visit Diagnosis 1. Encounter for antineoplastic chemotherapy   2. Malignant neoplasm of lower lobe of right lung ([place])      Dr. Randa Evens, MD, MPH [facility] [phone] 12/16/2020 10:07 AM

## 2020-12-16 NOTE — Patient Instructions (Signed)
[facility]  Discharge Instructions: Thank you for choosing [facility] to provide your oncology and hematology care.  If you have a lab appointment with the [facility], please go directly to the [facility] and check in at the registration area.  Wear comfortable clothing and clothing appropriate for easy access to any Portacath or PICC line.   We strive to give you quality time with your provider. You may need to reschedule your appointment if you arrive late (15 or more minutes).  Arriving late affects you and other patients whose appointments are after yours.  Also, if you miss three or more appointments without notifying the office, you may be dismissed from the clinic at the provider's discretion.      For prescription refill requests, have your pharmacy contact our office and allow 72 hours for refills to be completed.    Today you received the following chemotherapy and/or immunotherapy agents - paclitaxel, carboplatin      To help prevent nausea and vomiting after your treatment, we encourage you to take your nausea medication as directed.  BELOW ARE SYMPTOMS THAT SHOULD BE REPORTED IMMEDIATELY: . *FEVER GREATER THAN 100.4 F (38 C) OR HIGHER . *CHILLS OR SWEATING . *NAUSEA AND VOMITING THAT IS NOT CONTROLLED WITH YOUR NAUSEA MEDICATION . *UNUSUAL SHORTNESS OF BREATH . *UNUSUAL BRUISING OR BLEEDING . *URINARY PROBLEMS (pain or burning when urinating, or frequent urination) . *BOWEL PROBLEMS (unusual diarrhea, constipation, pain near the anus) . TENDERNESS IN MOUTH AND THROAT WITH OR WITHOUT PRESENCE OF ULCERS (sore throat, sores in mouth, or a toothache) . UNUSUAL RASH, SWELLING OR PAIN  . UNUSUAL VAGINAL DISCHARGE OR ITCHING   Items with * indicate a potential emergency and should be followed up as soon as possible or go to the Emergency Department if any problems should occur.  Please show the CHEMOTHERAPY ALERT CARD or  IMMUNOTHERAPY ALERT CARD at check-in to the Emergency Department and triage nurse.  Should you have questions after your visit or need to cancel or reschedule your appointment, please contact [facility]  [phone] and follow the prompts.  Office hours are 8:00 a.m. to 4:30 p.m. Monday - Friday. Please note that voicemails left after 4:00 p.m. may not be returned until the following business day.  We are closed weekends and major holidays. You have access to a nurse at all times for urgent questions. Please call the main number to the clinic [phone] and follow the prompts.  For any non-urgent questions, you may also contact your provider using MyChart. We now offer e-Visits for anyone 44 and older to request care online for non-urgent symptoms. For details visit [email or web address].   Also download the MyChart app! Go to the app store, search "MyChart", open the app, select [facility], and log in with your MyChart username and password.  Due to Covid, a mask is required upon entering the hospital/clinic. If you do not have a mask, one will be given to you upon arrival. For doctor visits, patients may have 1 support person aged 20 or older with them. For treatment visits, patients cannot have anyone with them due to current Covid guidelines and our immunocompromised population.   Paclitaxel injection What is this medicine? PACLITAXEL (PAK li TAX el) is a chemotherapy drug. It targets fast dividing cells, like cancer cells, and causes these cells to die. This medicine is used to treat ovarian cancer, breast cancer, lung cancer, Kaposi's sarcoma,  and other cancers. This medicine may be used for other purposes; ask your health care provider or pharmacist if you have questions. COMMON BRAND NAME(S): Onxol, Taxol What should I tell my health care provider before I take this medicine? They need to know if you have any of these conditions:  history of  irregular heartbeat  liver disease  low blood counts, like low white cell, platelet, or red cell counts  lung or breathing disease, like asthma  tingling of the fingers or toes, or other nerve disorder  an unusual or allergic reaction to paclitaxel, alcohol, polyoxyethylated castor oil, other chemotherapy, other medicines, foods, dyes, or preservatives  pregnant or trying to get pregnant  breast-feeding How should I use this medicine? This drug is given as an infusion into a vein. It is administered in a hospital or clinic by a specially trained health care professional. Talk to your pediatrician regarding the use of this medicine in children. Special care may be needed. Overdosage: If you think you have taken too much of this medicine contact a poison control center or emergency room at once. NOTE: This medicine is only for you. Do not share this medicine with others. What if I miss a dose? It is important not to miss your dose. Call your doctor or health care professional if you are unable to keep an appointment. What may interact with this medicine? Do not take this medicine with any of the following medications:  live virus vaccines This medicine may also interact with the following medications:  antiviral medicines for hepatitis, HIV or AIDS  certain antibiotics like erythromycin and clarithromycin  certain medicines for fungal infections like ketoconazole and itraconazole  certain medicines for seizures like carbamazepine, phenobarbital, phenytoin  gemfibrozil  nefazodone  rifampin  St. John's wort This list may not describe all possible interactions. Give your health care provider a list of all the medicines, herbs, non-prescription drugs, or dietary supplements you use. Also tell them if you smoke, drink alcohol, or use illegal drugs. Some items may interact with your medicine. What should I watch for while using this medicine? Your condition will be monitored  carefully while you are receiving this medicine. You will need important blood work done while you are taking this medicine. This medicine can cause serious allergic reactions. To reduce your risk you will need to take other medicine(s) before treatment with this medicine. If you experience allergic reactions like skin rash, itching or hives, swelling of the face, lips, or tongue, tell your doctor or health care professional right away. In some cases, you may be given additional medicines to help with side effects. Follow all directions for their use. This drug may make you feel generally unwell. This is not uncommon, as chemotherapy can affect healthy cells as well as cancer cells. Report any side effects. Continue your course of treatment even though you feel ill unless your doctor tells you to stop. Call your doctor or health care professional for advice if you get a fever, chills or sore throat, or other symptoms of a cold or flu. Do not treat yourself. This drug decreases your body's ability to fight infections. Try to avoid being around people who are sick. This medicine may increase your risk to bruise or bleed. Call your doctor or health care professional if you notice any unusual bleeding. Be careful brushing and flossing your teeth or using a toothpick because you may get an infection or bleed more easily. If you have any dental work  done, tell your dentist you are receiving this medicine. Avoid taking products that contain aspirin, acetaminophen, ibuprofen, naproxen, or ketoprofen unless instructed by your doctor. These medicines may hide a fever. Do not become pregnant while taking this medicine. Women should inform their doctor if they wish to become pregnant or think they might be pregnant. There is a potential for serious side effects to an unborn child. Talk to your health care professional or pharmacist for more information. Do not breast-feed an infant while taking this medicine. Men are  advised not to father a child while receiving this medicine. This product may contain alcohol. Ask your pharmacist or healthcare provider if this medicine contains alcohol. Be sure to tell all healthcare providers you are taking this medicine. Certain medicines, like metronidazole and disulfiram, can cause an unpleasant reaction when taken with alcohol. The reaction includes flushing, headache, nausea, vomiting, sweating, and increased thirst. The reaction can last from 30 minutes to several hours. What side effects may I notice from receiving this medicine? Side effects that you should report to your doctor or health care professional as soon as possible:  allergic reactions like skin rash, itching or hives, swelling of the face, lips, or tongue  breathing problems  changes in vision  fast, irregular heartbeat  high or low blood pressure  mouth sores  pain, tingling, numbness in the hands or feet  signs of decreased platelets or bleeding - bruising, pinpoint red spots on the skin, black, tarry stools, blood in the urine  signs of decreased red blood cells - unusually weak or tired, feeling faint or lightheaded, falls  signs of infection - fever or chills, cough, sore throat, pain or difficulty passing urine  signs and symptoms of liver injury like dark yellow or brown urine; general ill feeling or flu-like symptoms; light-colored stools; loss of appetite; nausea; right upper belly pain; unusually weak or tired; yellowing of the eyes or skin  swelling of the ankles, feet, hands  unusually slow heartbeat Side effects that usually do not require medical attention (report to your doctor or health care professional if they continue or are bothersome):  diarrhea  hair loss  loss of appetite  muscle or joint pain  nausea, vomiting  pain, redness, or irritation at site where injected  tiredness This list may not describe all possible side effects. Call your doctor for medical  advice about side effects. You may report side effects to FDA at 1-800-FDA-1088. Where should I keep my medicine? This drug is given in a hospital or clinic and will not be stored at home. NOTE: This sheet is a summary. It may not cover all possible information. If you have questions about this medicine, talk to your doctor, pharmacist, or health care provider.  2021 Elsevier/Gold Standard (2019-06-20 13:37:23)  Carboplatin injection What is this medicine? CARBOPLATIN (KAR boe pla tin) is a chemotherapy drug. It targets fast dividing cells, like cancer cells, and causes these cells to die. This medicine is used to treat ovarian cancer and many other cancers. This medicine may be used for other purposes; ask your health care provider or pharmacist if you have questions. COMMON BRAND NAME(S): Paraplatin What should I tell my health care provider before I take this medicine? They need to know if you have any of these conditions:  blood disorders  hearing problems  kidney disease  recent or ongoing radiation therapy  an unusual or allergic reaction to carboplatin, cisplatin, other chemotherapy, other medicines, foods, dyes, or preservatives  pregnant or trying to get pregnant  breast-feeding How should I use this medicine? This drug is usually given as an infusion into a vein. It is administered in a hospital or clinic by a specially trained health care professional. Talk to your pediatrician regarding the use of this medicine in children. Special care may be needed. Overdosage: If you think you have taken too much of this medicine contact a poison control center or emergency room at once. NOTE: This medicine is only for you. Do not share this medicine with others. What if I miss a dose? It is important not to miss a dose. Call your doctor or health care professional if you are unable to keep an appointment. What may interact with this medicine?  medicines for seizures  medicines  to increase blood counts like filgrastim, pegfilgrastim, sargramostim  some antibiotics like amikacin, gentamicin, neomycin, streptomycin, tobramycin  vaccines Talk to your doctor or health care professional before taking any of these medicines:  acetaminophen  aspirin  ibuprofen  ketoprofen  naproxen This list may not describe all possible interactions. Give your health care provider a list of all the medicines, herbs, non-prescription drugs, or dietary supplements you use. Also tell them if you smoke, drink alcohol, or use illegal drugs. Some items may interact with your medicine. What should I watch for while using this medicine? Your condition will be monitored carefully while you are receiving this medicine. You will need important blood work done while you are taking this medicine. This drug may make you feel generally unwell. This is not uncommon, as chemotherapy can affect healthy cells as well as cancer cells. Report any side effects. Continue your course of treatment even though you feel ill unless your doctor tells you to stop. In some cases, you may be given additional medicines to help with side effects. Follow all directions for their use. Call your doctor or health care professional for advice if you get a fever, chills or sore throat, or other symptoms of a cold or flu. Do not treat yourself. This drug decreases your body's ability to fight infections. Try to avoid being around people who are sick. This medicine may increase your risk to bruise or bleed. Call your doctor or health care professional if you notice any unusual bleeding. Be careful brushing and flossing your teeth or using a toothpick because you may get an infection or bleed more easily. If you have any dental work done, tell your dentist you are receiving this medicine. Avoid taking products that contain aspirin, acetaminophen, ibuprofen, naproxen, or ketoprofen unless instructed by your doctor. These medicines  may hide a fever. Do not become pregnant while taking this medicine. Women should inform their doctor if they wish to become pregnant or think they might be pregnant. There is a potential for serious side effects to an unborn child. Talk to your health care professional or pharmacist for more information. Do not breast-feed an infant while taking this medicine. What side effects may I notice from receiving this medicine? Side effects that you should report to your doctor or health care professional as soon as possible:  allergic reactions like skin rash, itching or hives, swelling of the face, lips, or tongue  signs of infection - fever or chills, cough, sore throat, pain or difficulty passing urine  signs of decreased platelets or bleeding - bruising, pinpoint red spots on the skin, black, tarry stools, nosebleeds  signs of decreased red blood cells - unusually weak or tired, fainting  spells, lightheadedness  breathing problems  changes in hearing  changes in vision  chest pain  high blood pressure  low blood counts - This drug may decrease the number of white blood cells, red blood cells and platelets. You may be at increased risk for infections and bleeding.  nausea and vomiting  pain, swelling, redness or irritation at the injection site  pain, tingling, numbness in the hands or feet  problems with balance, talking, walking  trouble passing urine or change in the amount of urine Side effects that usually do not require medical attention (report to your doctor or health care professional if they continue or are bothersome):  hair loss  loss of appetite  metallic taste in the mouth or changes in taste This list may not describe all possible side effects. Call your doctor for medical advice about side effects. You may report side effects to FDA at 1-800-FDA-1088. Where should I keep my medicine? This drug is given in a hospital or clinic and will not be stored at  home. NOTE: This sheet is a summary. It may not cover all possible information. If you have questions about this medicine, talk to your doctor, pharmacist, or health care provider.  2021 Elsevier/Gold Standard (2007-10-24 14:38:05)

## 2020-12-16 NOTE — Telephone Encounter (Signed)
I attempted to contact the patient to schedule a port placement and a message was left for a return call.

## 2020-12-16 NOTE — Progress Notes (Signed)
Nutrition Assessment:  Patient with recent COVID infection, new lung cancer  65 year old female with stage III lung cancer.  Past medical history of HTN,GERD, hyponatremia, asthma.  Recent COVID 19 infection.  Patient starting chemotherapy and radiation therapy today.   Met with patient during infusion.  Patient reports that she has a good/normal appetite.      Medications: dexamethasone, nexium Ativan, zofran    Labs: Na 126  Anthropometrics:   Height: 65 inches Weight: 159 lb UBW: 160 10/16/20 BMI: 26  Stable weight   Estimated Energy Needs  Kcals: 1800-2100 Protein: 90-105 g Fluid: 1.8 L  NUTRITION DIAGNOSIS: Food and nutrition related knowledge deficit related to new diagnosis of cancer and starting treatment.   INTERVENTION:  Discussed importance of nutrition during treatment and weight maintenance.   Encouraged good sources of protein. Encouraged patient to be proactive in discussed nutrition impact symptoms with medical team so they can be treatment.  Handout given on Nutrition During Cancer Treatment.  Contact information given.    MONITORING, EVALUATION, GOAL: weight trends, intake   NEXT VISIT: to be determined with treatment  Jimie Kuwahara B. Zenia Resides, [place], [place] Registered Dietitian [phone] (mobile)

## 2020-12-16 NOTE — Telephone Encounter (Signed)
Called patient to review future appointments. Patient confirmed all dates and times.

## 2020-12-16 NOTE — Progress Notes (Signed)
  Oncology Nurse Navigator Documentation  Navigator Location: [facility] (12/16/20 1000)   )Navigator Encounter Type: Follow-up Appt;Treatment (12/16/20 1000)                   Treatment Initiated Date: 12/16/20 (12/16/20 1000) Patient Visit Type: MedOnc (12/16/20 1000) Treatment Phase: First Chemo Tx;First Radiation Tx (12/16/20 1000) Barriers/Navigation Needs: No Barriers At This Time;No Needs;No Questions (12/16/20 1000)   Interventions: Referrals (12/16/20 1000) Referrals: Other (port placement) (12/16/20 1000)         met with patient after follow up visit with Dr. Janese Banks. Pt did not have any questions or needs during visit. Informed that will make referral for port placement and to expect a phone call with her appt for port placement. Instructed to call with any further questions or needs. Pt verbalized understanding. Nothing further needed at this time.           Time Spent with Patient: 30 (12/16/20 1000)

## 2020-12-16 NOTE — H&P (View-Only) (Signed)
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 She quit smoking about 26 years ago. Other medical problems include hypertension and hyponatremia.She has been treated for iron of possible pneumonia for the last 1 year. She has a history of intermittent asthma for which she uses Advair. She was seen by Dr. And underwent CT chest which showed dense infiltrate/solid mass in the right lower lobe with contiguous airspace opacity in the right upper lobe. Findings could be secondary to pneumonia but concerning for bronchogenic carcinoma. Patient then had a PET CT scan which showed consolidation in the medial aspect of the right lower lobe measuring 3.4 x 4 cm with an SUV of 11.6. Hypermetabolic masslike thickening in the right hilum measuring 2.2 cm with intense hypermetabolic activity. Groundglass airspace consolidation in the posterior aspect of the right upper lobe with an SUV of 11.4. The right upper lobe opacity was nonspecific and differentials include pulmonary infection versus lymphangitic spread of carcinoma.CT chest showed showed masslike area of distortion involving right lower lobe middle lobe as well as right upper lobe. Generalized right hilar masslike appearance concerning for nodal involvement.  Patient underwent bronchoscopy and biopsies.Right lower lobe was concerning for adenocarcinoma. Lymph node  station 11 R, 4R, 7 were negative for malignancy. No malignant cells identified in the right upper lobe.  Case was discussed at tumor board and given the hypermetabolism in the right upper lobe as well as hilum involvement cannot be ruled out despite negative biopsies.  We are therefore proceeding with her having stage III T3N1 disease with concurrent chemoradiation with weekly CarboTaxol   Interval history-reports occasional dry cough and some exertional shortness of breath.  Denies any other complaints at this time  ECOG PS- 1 Pain scale- 0 Opioid associated constipation- no  Review of systems- Review of Systems  Constitutional: Positive for malaise/fatigue. Negative for chills, fever and weight loss.  HENT: Negative for congestion, ear discharge and nosebleeds.   Eyes: Negative for blurred vision.  Respiratory: Positive for cough. Negative for hemoptysis, sputum production, shortness of breath and wheezing.   Cardiovascular: Negative for chest pain, palpitations, orthopnea and claudication.  Gastrointestinal: Negative for abdominal pain, blood in stool, constipation, diarrhea, heartburn, melena, nausea and vomiting.  Genitourinary: Negative for dysuria, flank pain, frequency, hematuria and urgency.  Musculoskeletal: Negative for back pain, joint pain and myalgias.  Skin: Negative for rash.  Neurological: Negative for dizziness, tingling, focal weakness, seizures, weakness and headaches.  Endo/Heme/Allergies: Does not bruise/bleed easily.  Psychiatric/Behavioral: Negative for depression and suicidal ideas. The patient does not have insomnia.        No Known Allergies   Past Medical History:  Diagnosis Date  . Anxiety   . Asthma   . Cough   . Dyspnea    with exertion  . GERD (gastroesophageal reflux disease)   . Hypertension      Past Surgical History:  Procedure Laterality Date  . BUNIONECTOMY Bilateral   . COLONOSCOPY    .  VIDEO BRONCHOSCOPY WITH ENDOBRONCHIAL  NAVIGATION N/A 11/07/2020   Procedure: VIDEO BRONCHOSCOPY WITH ENDOBRONCHIAL NAVIGATION;  Surgeon: Ottie Glazier, MD;  Location: [facility] ORS;  Service: Thoracic;  Laterality: N/A;  . VIDEO BRONCHOSCOPY WITH ENDOBRONCHIAL ULTRASOUND N/A 11/07/2020   Procedure: VIDEO BRONCHOSCOPY WITH ENDOBRONCHIAL ULTRASOUND;  Surgeon: Ottie Glazier, MD;  Location: [facility] ORS;  Service: Thoracic;  Laterality: N/A;    Social History   Socioeconomic History  . Marital status: Married    Spouse name: Not on file  . Number of children: Not on file  . Years of education: Not on file  . Highest education level: Not on file  Occupational History  . Not on file  Tobacco Use  . Smoking status: Former Smoker    Packs/day: 1.00    Years: 15.00    Pack years: 15.00    Types: Cigarettes    Quit date: 11/07/1994    Years since quitting: 26.1  . Smokeless tobacco: Never Used  Vaping Use  . Vaping Use: Never used  Substance and Sexual Activity  . Alcohol use: Yes    Comment:  wine daily  . Drug use: Never  . Sexual activity: Not on file  Other Topics Concern  . Not on file  Social History Narrative  . Not on file   Social Determinants of Health   Financial Resource Strain: Not on file  Food Insecurity: Not on file  Transportation Needs: Not on file  Physical Activity: Not on file  Stress: Not on file  Social Connections: Not on file  Intimate Partner Violence: Not on file    History reviewed. No pertinent family history.   Current Outpatient Medications:  .  ADVAIR DISKUS 250-50 MCG/DOSE AEPB, Inhale 1 puff into the lungs as needed., Disp: , Rfl:  .  ALPRAZolam (XANAX) 0.5 MG tablet, Take 0.5 mg by mouth at bedtime as needed for sleep., Disp: , Rfl:  .  amLODipine (NORVASC) 5 MG tablet, Take 5 mg by mouth at bedtime., Disp: , Rfl:  .  Cholecalciferol 50 MCG (2000 UT) CAPS, Take 2,000 Units by mouth daily., Disp: , Rfl:  .  dexamethasone (DECADRON) 4 MG tablet, Take 2 tablets (8 mg total) by mouth  daily. Start the day after chemotherapy for 2 days., Disp: 30 tablet, Rfl: 1 .  esomeprazole (NEXIUM) 20 MG capsule, Take 20 mg by mouth as needed., Disp: , Rfl:  .  fluticasone (FLONASE) 50 MCG/ACT nasal spray, Place 2 sprays into both nostrils daily as needed for rhinitis., Disp: , Rfl:  .  lidocaine-prilocaine (EMLA) cream, Apply to affected area once, Disp: 30 g, Rfl: 3 .  LORazepam (ATIVAN) 0.5 MG tablet, Take 1 tablet (0.5 mg total) by mouth every 6 (six) hours as needed (Nausea or vomiting)., Disp: 30 tablet, Rfl: 0 .  metoprolol succinate (TOPROL-XL) 50 MG 24 hr tablet, Take 50 mg by mouth every morning., Disp: , Rfl:  .  olmesartan (BENICAR) 40 MG tablet, Take 40 mg by mouth at bedtime., Disp: , Rfl:  .  ondansetron (ZOFRAN) 8 MG tablet, Take 1 tablet (8 mg total) by mouth 2 (two) times daily as needed for refractory nausea / vomiting. Start on day 3 after chemo., Disp: 30 tablet, Rfl: 1 .  prochlorperazine (COMPAZINE) 10 MG tablet, Take 1 tablet (10 mg total) by mouth every 6 (six) hours as needed (Nausea or vomiting)., Disp: 30 tablet, Rfl: 1 .  zolpidem (AMBIEN) 10 MG tablet, Take 10 mg by mouth at bedtime as needed  for sleep., Disp: , Rfl:  No current facility-administered medications for this visit.  Facility-Administered Medications Ordered in Other Visits:  .  CARBOplatin (PARAPLATIN) 210 mg in sodium chloride 0.9 % 250 mL chemo infusion, 210 mg, Intravenous, Once, Sindy Guadeloupe, MD .  dexamethasone (DECADRON) 20 mg in sodium chloride 0.9 % 50 mL IVPB, 20 mg, Intravenous, Once, Sindy Guadeloupe, MD .  famotidine (PEPCID) IVPB 20 mg in NS 100 mL IVPB, 20 mg, Intravenous, Once, Sindy Guadeloupe, MD .  PACLitaxel (TAXOL) 84 mg in sodium chloride 0.9 % 250 mL chemo infusion (</= 80mg /m2), 45 mg/m2 (Treatment Plan Recorded), Intravenous, Once, Sindy Guadeloupe, MD .  sodium chloride flush (NS) 0.9 % injection 10 mL, 10 mL, Intravenous, Once, Sindy Guadeloupe, MD .  sodium chloride flush (NS)  0.9 % injection 10 mL, 10 mL, Intracatheter, PRN, Sindy Guadeloupe, MD  Physical exam:  Vitals:   12/16/20 0920  BP: (!) 147/72  Pulse: 94  Resp: 20  Temp: 98 F (36.7 C)  TempSrc: Tympanic  SpO2: 100%  Weight: 159 lb 9.6 oz (72.4 kg)   Physical Exam Cardiovascular:     Rate and Rhythm: Normal rate and regular rhythm.     Heart sounds: Normal heart sounds.  Pulmonary:     Effort: Pulmonary effort is normal.     Breath sounds: Normal breath sounds.  Abdominal:     General: Bowel sounds are normal.     Palpations: Abdomen is soft.  Skin:    General: Skin is warm and dry.  Neurological:     Mental Status: She is alert and oriented to person, place, and time.      No flowsheet data found. CBC Latest Ref Rng & Units 12/16/2020  WBC 4.0 - 10.5 K/uL 5.9  Hemoglobin 12.0 - 15.0 g/dL 12.4  Hematocrit 36.0 - 46.0 % 36.2  Platelets 150 - 400 K/uL 319    No images are attached to the encounter.  MR Brain W Wo Contrast  Result Date: 12/13/2020 CLINICAL DATA:  Non-small cell lung cancer staging EXAM: MRI HEAD WITHOUT AND WITH CONTRAST TECHNIQUE: Multiplanar, multiecho pulse sequences of the brain and surrounding structures were obtained without and with intravenous contrast. CONTRAST:  19mL GADAVIST GADOBUTROL 1 MMOL/ML IV SOLN COMPARISON:  None. FINDINGS: Brain: No swelling or enhancement to suggest metastatic disease. Mild chronic small vessel ischemic type change in the cerebral white matter. No incidental infarct, hemorrhage, hydrocephalus, or collection. Vascular: Normal flow voids and vascular enhancements Skull and upper cervical spine: Normal marrow signal. Scattered areas of susceptibility artifact along the scalp. Sinuses/Orbits: Negative. IMPRESSION: No evidence of intracranial metastasis. Electronically Signed   By: Monte Fantasia M.D.   On: 12/13/2020 06:23     Assessment and plan- Patient is a 65 y.o. female with adenocarcinoma of the right lung clinical stage III acT3 N1  M0.  She is here for on treatment assessment prior to cycle 1 of CarboTaxol chemotherapy concurrent with radiation  Treatment start was delayed as patient came down with COVID.  He has now recovered well and is 2 weeks out of her infection.  Counts are okay to proceed with cycle 1 of CarboTaxol chemotherapy which will be given on a weekly basis along with radiation.  I will see her back in 1 week with labs for cycle 2.  Plan is to do 7 weeks of concurrent chemoradiation followed by repeat scans and if patient has partial response/stable disease will proceed to maintenance  immunotherapy.  Discussed risks and benefits of CarboTaxol chemotherapy including all but not limited to nausea, vomiting, low blood counts, risk of infections and hospitalization and risk of infusion reaction associated with Taxol.  Treatment will be given with a curative intent.  Patient understands and agrees to proceed as planned.  Patient has not had a port placement yet and we will plan to get that done in the next 1 week.  Baseline MRI brain was negative for metastatic disease  Hyponatremia: We will add 1 L of IV fluids to her treatment regimen today   Visit Diagnosis 1. Encounter for antineoplastic chemotherapy   2. Malignant neoplasm of lower lobe of right lung ([place])      Dr. Randa Evens, MD, MPH [facility] [phone] 12/16/2020 10:07 AM

## 2020-12-17 ENCOUNTER — Ambulatory Visit
Admission: RE | Admit: 2020-12-17 | Discharge: 2020-12-17 | Disposition: A | Discharge status: Home / Self Care | Payer: BC Managed Care – PPO | Source: Ambulatory Visit | Attending: Radiation Oncology | Admitting: Radiation Oncology

## 2020-12-17 ENCOUNTER — Ambulatory Visit: Payer: BC Managed Care – PPO

## 2020-12-17 DIAGNOSIS — Z510 Encounter for antineoplastic radiation therapy: Secondary | ICD-10-CM | POA: Diagnosis not present

## 2020-12-18 ENCOUNTER — Telehealth: Payer: Self-pay

## 2020-12-18 ENCOUNTER — Ambulatory Visit: Payer: BC Managed Care – PPO

## 2020-12-18 ENCOUNTER — Ambulatory Visit
Admission: RE | Admit: 2020-12-18 | Discharge: 2020-12-18 | Disposition: A | Discharge status: Home / Self Care | Payer: BC Managed Care – PPO | Source: Ambulatory Visit | Attending: Radiation Oncology | Admitting: Radiation Oncology

## 2020-12-18 DIAGNOSIS — Z510 Encounter for antineoplastic radiation therapy: Secondary | ICD-10-CM | POA: Diagnosis not present

## 2020-12-18 NOTE — Telephone Encounter (Signed)
Telephone call to patient for follow up after receiving first infusion.   Patient states infusion went great.  States eating good and drinking plenty of fluids.   Denies any nausea or vomiting.  Encouraged patient to call for any concerns or questions. 

## 2020-12-19 ENCOUNTER — Ambulatory Visit
Admission: RE | Admit: 2020-12-19 | Discharge: 2020-12-19 | Disposition: A | Discharge status: Home / Self Care | Payer: BC Managed Care – PPO | Source: Ambulatory Visit | Attending: Radiation Oncology | Admitting: Radiation Oncology

## 2020-12-19 ENCOUNTER — Ambulatory Visit: Payer: BC Managed Care – PPO

## 2020-12-19 DIAGNOSIS — Z510 Encounter for antineoplastic radiation therapy: Secondary | ICD-10-CM | POA: Diagnosis not present

## 2020-12-22 ENCOUNTER — Ambulatory Visit: Payer: BC Managed Care – PPO

## 2020-12-22 ENCOUNTER — Other Ambulatory Visit: Payer: Self-pay

## 2020-12-22 ENCOUNTER — Ambulatory Visit
Admission: RE | Admit: 2020-12-22 | Discharge: 2020-12-22 | Disposition: A | Discharge status: Home / Self Care | Payer: BC Managed Care – PPO | Source: Ambulatory Visit | Attending: Radiation Oncology | Admitting: Radiation Oncology

## 2020-12-22 ENCOUNTER — Ambulatory Visit
Admission: RE | Admit: 2020-12-22 | Discharge: 2020-12-22 | Disposition: A | Discharge status: Home / Self Care | Payer: BC Managed Care – PPO | Source: Ambulatory Visit | Attending: Vascular Surgery | Admitting: Vascular Surgery

## 2020-12-22 ENCOUNTER — Other Ambulatory Visit (INDEPENDENT_AMBULATORY_CARE_PROVIDER_SITE_OTHER): Payer: Self-pay | Admitting: Nurse Practitioner

## 2020-12-22 ENCOUNTER — Encounter
Admission: RE | Disposition: A | Discharge status: Home / Self Care | Payer: Self-pay | Source: Ambulatory Visit | Attending: Vascular Surgery

## 2020-12-22 ENCOUNTER — Encounter: Payer: Self-pay | Admitting: Vascular Surgery

## 2020-12-22 DIAGNOSIS — C3431 Malignant neoplasm of lower lobe, right bronchus or lung: Secondary | ICD-10-CM | POA: Insufficient documentation

## 2020-12-22 DIAGNOSIS — Z7952 Long term (current) use of systemic steroids: Secondary | ICD-10-CM | POA: Diagnosis not present

## 2020-12-22 DIAGNOSIS — Z510 Encounter for antineoplastic radiation therapy: Secondary | ICD-10-CM | POA: Diagnosis not present

## 2020-12-22 DIAGNOSIS — I10 Essential (primary) hypertension: Secondary | ICD-10-CM | POA: Insufficient documentation

## 2020-12-22 DIAGNOSIS — Z7951 Long term (current) use of inhaled steroids: Secondary | ICD-10-CM | POA: Insufficient documentation

## 2020-12-22 DIAGNOSIS — Z79899 Other long term (current) drug therapy: Secondary | ICD-10-CM | POA: Diagnosis not present

## 2020-12-22 DIAGNOSIS — C3490 Malignant neoplasm of unspecified part of unspecified bronchus or lung: Secondary | ICD-10-CM

## 2020-12-22 DIAGNOSIS — Z87891 Personal history of nicotine dependence: Secondary | ICD-10-CM | POA: Diagnosis not present

## 2020-12-22 HISTORY — PX: PORTA CATH INSERTION: CATH118285

## 2020-12-22 SURGERY — PORTA CATH INSERTION
Anesthesia: Moderate Sedation

## 2020-12-22 MED ORDER — ONDANSETRON HCL 4 MG/2ML IJ SOLN: 4.0000 mg | Freq: Four times a day (QID) | INTRAMUSCULAR | Status: DC | PRN

## 2020-12-22 MED ORDER — SODIUM CHLORIDE 0.9 % IV SOLN: INTRAVENOUS | Status: DC

## 2020-12-22 MED ORDER — CEFAZOLIN SODIUM-DEXTROSE 2-4 GM/100ML-% IV SOLN
INTRAVENOUS | Status: AC
  Administered 2020-12-22: 2 g
  Filled 2020-12-22: qty 100

## 2020-12-22 MED ORDER — HYDROMORPHONE HCL 1 MG/ML IJ SOLN: 1.0000 mg | Freq: Once | INTRAMUSCULAR | Status: DC | PRN

## 2020-12-22 MED ORDER — HEPARIN SODIUM (PORCINE) 1000 UNIT/ML IJ SOLN
INTRAMUSCULAR | Status: AC
  Filled 2020-12-22: qty 1

## 2020-12-22 MED ORDER — CEFAZOLIN SODIUM-DEXTROSE 2-4 GM/100ML-% IV SOLN: 2.0000 g | Freq: Once | INTRAVENOUS | Status: DC

## 2020-12-22 MED ORDER — FENTANYL CITRATE (PF) 100 MCG/2ML IJ SOLN
INTRAMUSCULAR | Status: AC
  Filled 2020-12-22: qty 2

## 2020-12-22 MED ORDER — SODIUM CHLORIDE 0.9 % IV SOLN
Freq: Once | INTRAVENOUS | Status: DC
  Filled 2020-12-22: qty 2

## 2020-12-22 MED ORDER — MIDAZOLAM HCL 5 MG/5ML IJ SOLN
INTRAMUSCULAR | Status: AC
  Filled 2020-12-22: qty 5

## 2020-12-22 MED ORDER — DIPHENHYDRAMINE HCL 50 MG/ML IJ SOLN: 50.0000 mg | Freq: Once | INTRAMUSCULAR | Status: DC | PRN

## 2020-12-22 MED ORDER — METHYLPREDNISOLONE SODIUM SUCC 125 MG IJ SOLR: 125.0000 mg | Freq: Once | INTRAMUSCULAR | Status: DC | PRN

## 2020-12-22 MED ORDER — CHLORHEXIDINE GLUCONATE CLOTH 2 % EX PADS
6.0000 | MEDICATED_PAD | Freq: Every day | CUTANEOUS | Status: DC
  Administered 2020-12-22: 6 via TOPICAL

## 2020-12-22 MED ORDER — MIDAZOLAM HCL 2 MG/2ML IJ SOLN
INTRAMUSCULAR | Status: DC | PRN
  Administered 2020-12-22: 2 mg via INTRAVENOUS

## 2020-12-22 MED ORDER — FAMOTIDINE 20 MG PO TABS: 40.0000 mg | ORAL_TABLET | Freq: Once | ORAL | Status: DC | PRN

## 2020-12-22 MED ORDER — MIDAZOLAM HCL 2 MG/ML PO SYRP: 8.0000 mg | ORAL_SOLUTION | Freq: Once | ORAL | Status: DC | PRN

## 2020-12-22 MED ORDER — FENTANYL CITRATE (PF) 100 MCG/2ML IJ SOLN
INTRAMUSCULAR | Status: DC | PRN
  Administered 2020-12-22: 50 ug via INTRAVENOUS

## 2020-12-22 SURGICAL SUPPLY — 11 items
ADH SKN CLS APL DERMABOND .7 (GAUZE/BANDAGES/DRESSINGS) ×1
COVER PROBE U/S 5X48 (MISCELLANEOUS) ×2 IMPLANT
DERMABOND ADVANCED (GAUZE/BANDAGES/DRESSINGS) ×1
DERMABOND ADVANCED .7 DNX12 (GAUZE/BANDAGES/DRESSINGS) ×1 IMPLANT
KIT PORT POWER 8FR ISP CVUE (Port) ×2 IMPLANT
PACK ANGIOGRAPHY (CUSTOM PROCEDURE TRAY) ×2 IMPLANT
PENCIL ELECTRO HAND CTR (MISCELLANEOUS) ×2 IMPLANT
SPONGE XRAY 4X4 16PLY STRL (MISCELLANEOUS) ×2 IMPLANT
SUT MNCRL AB 4-0 PS2 18 (SUTURE) ×2 IMPLANT
SUT VIC AB 3-0 SH 27 (SUTURE) ×2
SUT VIC AB 3-0 SH 27X BRD (SUTURE) ×1 IMPLANT

## 2020-12-22 NOTE — Op Note (Signed)
      [facility]       Operative Note  Date: 12/22/2020  Preoperative diagnosis:  1. Lung cancer  Postoperative diagnosis:  Same as above  Procedures: #1. Ultrasound guidance for vascular access to the right internal jugular vein. #2. Fluoroscopic guidance for placement of catheter. #3. Placement of CT compatible Port-A-Cath, right internal jugular vein.  Surgeon: Leotis Pain, MD.   Anesthesia: Local with moderate conscious sedation for approximately 18  minutes using 2 mg of Versed and 50 mcg of Fentanyl  Fluoroscopy time: less than 1 minute  Contrast used: 0  Estimated blood loss: 3 cc  Indication for the procedure:  The patient is a 65 y.o.female with lung cancer.  The patient needs a Port-A-Cath for durable venous access, chemotherapy, lab draws, and CT scans. We are asked to place this. Risks and benefits were discussed and informed consent was obtained.  Description of procedure: The patient was brought to the vascular and interventional radiology suite.  Moderate conscious sedation was administered throughout the procedure during a face to face encounter with the patient with my supervision of the RN administering medicines and monitoring the patient's vital signs, pulse oximetry, telemetry and mental status throughout from the start of the procedure until the patient was taken to the recovery room. The right neck chest and shoulder were sterilely prepped and draped, and a sterile surgical field was created. Ultrasound was used to help visualize a patent right internal jugular vein. This was then accessed under direct ultrasound guidance without difficulty with the Seldinger needle and a permanent image was recorded. A J-wire was placed. After skin nick and dilatation, the peel-away sheath was then placed over the wire. I then anesthetized an area under the clavicle approximately 1-2 fingerbreadths. A transverse incision was created and an inferior pocket was  created with electrocautery and blunt dissection. The port was then brought onto the field, placed into the pocket and secured to the chest wall with 2 Prolene sutures. The catheter was connected to the port and tunneled from the subclavicular incision to the access site. Fluoroscopic guidance was then used to cut the catheter to an appropriate length. The catheter was then placed through the peel-away sheath and the peel-away sheath was removed. The catheter tip was parked in excellent location under fluorocoscopic guidance in the cavoatrial junction. The pocket was then irrigated with antibiotic impregnated saline and the wound was closed with a running 3-0 Vicryl and a 4-0 Monocryl. The access incision was closed with a single 4-0 Monocryl. The Huber needle was used to withdraw blood and flush the port with heparinized saline. Dermabond was then placed as a dressing. The patient tolerated the procedure well and was taken to the recovery room in stable condition.   Leotis Pain 12/22/2020 10:46 AM   This note was created with Dragon Medical transcription system. Any errors in dictation are purely unintentional.

## 2020-12-22 NOTE — Interval H&P Note (Signed)
History and Physical Interval Note:  12/22/2020 9:18 AM  Katherine Perry  has presented today for surgery, with the diagnosis of Porta Cath Placement   Lung Ca.  The various methods of treatment have been discussed with the patient and family. After consideration of risks, benefits and other options for treatment, the patient has consented to  Procedure(s): PORTA CATH INSERTION (N/A) as a surgical intervention.  The patient's history has been reviewed, patient examined, no change in status, stable for surgery.  I have reviewed the patient's chart and labs.  Questions were answered to the patient's satisfaction.     Leotis Pain

## 2020-12-23 ENCOUNTER — Ambulatory Visit: Payer: BC Managed Care – PPO

## 2020-12-23 ENCOUNTER — Ambulatory Visit
Admission: RE | Admit: 2020-12-23 | Discharge: 2020-12-23 | Disposition: A | Discharge status: Home / Self Care | Payer: BC Managed Care – PPO | Source: Ambulatory Visit | Attending: Radiation Oncology | Admitting: Radiation Oncology

## 2020-12-23 ENCOUNTER — Inpatient Hospital Stay (HOSPITAL_BASED_OUTPATIENT_CLINIC_OR_DEPARTMENT_OTHER): Payer: BC Managed Care – PPO | Admitting: Oncology

## 2020-12-23 ENCOUNTER — Inpatient Hospital Stay: Payer: BC Managed Care – PPO

## 2020-12-23 VITALS — BP 154/70 | HR 71 | Temp 97.4°F | Resp 20 | Wt 157.6 lb

## 2020-12-23 DIAGNOSIS — Z510 Encounter for antineoplastic radiation therapy: Secondary | ICD-10-CM | POA: Diagnosis not present

## 2020-12-23 DIAGNOSIS — C3431 Malignant neoplasm of lower lobe, right bronchus or lung: Secondary | ICD-10-CM

## 2020-12-23 DIAGNOSIS — Z5111 Encounter for antineoplastic chemotherapy: Secondary | ICD-10-CM | POA: Diagnosis not present

## 2020-12-23 LAB — COMPREHENSIVE METABOLIC PANEL
ALT: 14 U/L (ref 0–44)
AST: 17 U/L (ref 15–41)
Albumin: 3.7 g/dL (ref 3.5–5.0)
Alkaline Phosphatase: 51 U/L (ref 38–126)
Anion gap: 11 (ref 5–15)
BUN: 9 mg/dL (ref 8–23)
CO2: 23 mmol/L (ref 22–32)
Calcium: 9.1 mg/dL (ref 8.9–10.3)
Chloride: 100 mmol/L (ref 98–111)
Creatinine, Ser: 0.52 mg/dL (ref 0.44–1.00)
GFR, Estimated: >60 mL/min (ref >60)
Glucose, Bld: 112 mg/dL — ABNORMAL HIGH (ref 70–99)
Potassium: 4 mmol/L (ref 3.5–5.1)
Sodium: 134 mmol/L — ABNORMAL LOW (ref 135–145)
Total Bilirubin: 0.6 mg/dL (ref 0.3–1.2)
Total Protein: 7 g/dL (ref 6.5–8.1)

## 2020-12-23 LAB — CBC WITH DIFFERENTIAL/PLATELET
Abs Immature Granulocytes: 0.02 10*3/uL (ref 0.00–0.07)
Basophils Absolute: 0 10*3/uL (ref 0.0–0.1)
Basophils Relative: 0 %
Eosinophils Absolute: 0.1 10*3/uL (ref 0.0–0.5)
Eosinophils Relative: 2 %
HCT: 33.5 % — ABNORMAL LOW (ref 36.0–46.0)
Hemoglobin: 11.3 g/dL — ABNORMAL LOW (ref 12.0–15.0)
Immature Granulocytes: 1 %
Lymphocytes Relative: 19 %
Lymphs Abs: 0.8 10*3/uL (ref 0.7–4.0)
MCH: 32.6 pg (ref 26.0–34.0)
MCHC: 33.7 g/dL (ref 30.0–36.0)
MCV: 96.5 fL (ref 80.0–100.0)
Monocytes Absolute: 0.4 10*3/uL (ref 0.1–1.0)
Monocytes Relative: 10 %
Neutro Abs: 2.8 10*3/uL (ref 1.7–7.7)
Neutrophils Relative %: 68 %
Platelets: 267 10*3/uL (ref 150–400)
RBC: 3.47 MIL/uL — ABNORMAL LOW (ref 3.87–5.11)
RDW: 13.2 % (ref 11.5–15.5)
WBC: 4.1 10*3/uL (ref 4.0–10.5)
nRBC: 0 % (ref 0.0–0.2)

## 2020-12-23 LAB — ACID FAST CULTURE WITH REFLEXED SENSITIVITIES (MYCOBACTERIA): Acid Fast Culture: NEGATIVE

## 2020-12-23 MED ORDER — SODIUM CHLORIDE 0.9 % IV SOLN
Freq: Once | INTRAVENOUS | Status: AC
  Filled 2020-12-23: qty 250

## 2020-12-23 MED ORDER — HEPARIN SOD (PORK) LOCK FLUSH 100 UNIT/ML IV SOLN
500.0000 [IU] | Freq: Once | INTRAVENOUS | Status: AC
  Administered 2020-12-23: 500 [IU] via INTRAVENOUS
  Filled 2020-12-23: qty 5

## 2020-12-23 MED ORDER — HEPARIN SOD (PORK) LOCK FLUSH 100 UNIT/ML IV SOLN
INTRAVENOUS | Status: AC
  Filled 2020-12-23: qty 5

## 2020-12-23 MED ORDER — SODIUM CHLORIDE 0.9 % IV SOLN
45.0000 mg/m2 | Freq: Once | INTRAVENOUS | Status: AC
  Administered 2020-12-23: 84 mg via INTRAVENOUS
  Filled 2020-12-23: qty 14

## 2020-12-23 MED ORDER — SODIUM CHLORIDE 0.9% FLUSH
10.0000 mL | INTRAVENOUS | Status: DC | PRN
  Administered 2020-12-23: 10 mL via INTRAVENOUS
  Filled 2020-12-23: qty 10

## 2020-12-23 MED ORDER — FAMOTIDINE 20 MG IN NS 100 ML IVPB
20.0000 mg | Freq: Once | INTRAVENOUS | Status: AC
  Administered 2020-12-23: 20 mg via INTRAVENOUS
  Filled 2020-12-23: qty 20

## 2020-12-23 MED ORDER — SODIUM CHLORIDE 0.9 % IV SOLN
20.0000 mg | Freq: Once | INTRAVENOUS | Status: AC
  Administered 2020-12-23: 20 mg via INTRAVENOUS
  Filled 2020-12-23: qty 20

## 2020-12-23 MED ORDER — DIPHENHYDRAMINE HCL 50 MG/ML IJ SOLN
50.0000 mg | Freq: Once | INTRAMUSCULAR | Status: AC
  Administered 2020-12-23: 50 mg via INTRAVENOUS
  Filled 2020-12-23: qty 1

## 2020-12-23 MED ORDER — SODIUM CHLORIDE 0.9 % IV SOLN
214.8000 mg | Freq: Once | INTRAVENOUS | Status: AC
  Administered 2020-12-23: 210 mg via INTRAVENOUS
  Filled 2020-12-23: qty 21

## 2020-12-23 MED ORDER — PALONOSETRON HCL INJECTION 0.25 MG/5ML
0.2500 mg | Freq: Once | INTRAVENOUS | Status: AC
  Administered 2020-12-23: 0.25 mg via INTRAVENOUS
  Filled 2020-12-23: qty 5

## 2020-12-23 NOTE — Patient Instructions (Signed)
[facility] [place] [facility]  Discharge Instructions: Thank you for choosing [facility] to provide your oncology and hematology care.  If you have a lab appointment with the Cancer Center, please go directly to the Cancer Center and check in at the registration area.  Wear comfortable clothing and clothing appropriate for easy access to any Portacath or PICC line.   We strive to give you quality time with your provider. You may need to reschedule your appointment if you arrive late (15 or more minutes).  Arriving late affects you and other patients whose appointments are after yours.  Also, if you miss three or more appointments without notifying the office, you may be dismissed from the clinic at the provider's discretion.      For prescription refill requests, have your pharmacy contact our office and allow 72 hours for refills to be completed.    Today you received the following chemotherapy and/or immunotherapy agents Taxol & Carboplatin      To help prevent nausea and vomiting after your treatment, we encourage you to take your nausea medication as directed.  BELOW ARE SYMPTOMS THAT SHOULD BE REPORTED IMMEDIATELY: *FEVER GREATER THAN 100.4 F (38 C) OR HIGHER *CHILLS OR SWEATING *NAUSEA AND VOMITING THAT IS NOT CONTROLLED WITH YOUR NAUSEA MEDICATION *UNUSUAL SHORTNESS OF BREATH *UNUSUAL BRUISING OR BLEEDING *URINARY PROBLEMS (pain or burning when urinating, or frequent urination) *BOWEL PROBLEMS (unusual diarrhea, constipation, pain near the anus) TENDERNESS IN MOUTH AND THROAT WITH OR WITHOUT PRESENCE OF ULCERS (sore throat, sores in mouth, or a toothache) UNUSUAL RASH, SWELLING OR PAIN  UNUSUAL VAGINAL DISCHARGE OR ITCHING   Items with * indicate a potential emergency and should be followed up as soon as possible or go to the Emergency Department if any problems should occur.  Please show the CHEMOTHERAPY ALERT CARD or IMMUNOTHERAPY ALERT CARD at  check-in to the Emergency Department and triage nurse.  Should you have questions after your visit or need to cancel or reschedule your appointment, please contact [facility]  [phone] and follow the prompts.  Office hours are 8:00 a.m. to 4:30 p.m. Monday - Friday. Please note that voicemails left after 4:00 p.m. may not be returned until the following business day.  We are closed weekends and major holidays. You have access to a nurse at all times for urgent questions. Please call the main number to the clinic [phone] and follow the prompts.  For any non-urgent questions, you may also contact your provider using MyChart. We now offer e-Visits for anyone 18 and older to request care online for non-urgent symptoms. For details visit [email or web address].   Also download the MyChart app! Go to the app store, search "MyChart", open the app, select [facility], and log in with your MyChart username and password.  Due to Covid, a mask is required upon entering the hospital/clinic. If you do not have a mask, one will be given to you upon arrival. For doctor visits, patients may have 1 support person aged 18 or older with them. For treatment visits, patients cannot have anyone with them due to current Covid guidelines and our immunocompromised population.  

## 2020-12-23 NOTE — Progress Notes (Signed)
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 She quit smoking about 26 years ago. Other medical problems include hypertension and hyponatremia.She has been treated for iron of possible pneumonia for the last 1 year. She has a history of intermittent asthma for which she uses Advair. She was seen by Dr. And underwent CT chest which showed dense infiltrate/solid mass in the right lower lobe with contiguous airspace opacity in the right upper lobe. Findings could be secondary to pneumonia but concerning for bronchogenic carcinoma. Patient then had a PET CT scan which showed consolidation in the medial aspect of the right lower lobe measuring 3.4 x 4 cm with an SUV of 11.6. Hypermetabolic masslike thickening in the right hilum measuring 2.2 cm with intense hypermetabolic activity. Groundglass airspace consolidation in the posterior aspect of the right upper lobe with an SUV of 11.4. The right upper lobe opacity was nonspecific and differentials include pulmonary infection versus lymphangitic spread of carcinoma.CT chest showed showed masslike area of distortion involving right lower lobe middle lobe as well as right upper lobe. Generalized right hilar masslike appearance concerning for nodal involvement.  Patient underwent bronchoscopy and biopsies.Right lower lobe was concerning for adenocarcinoma. Lymph node  station 11 R, 4R, 7 were negative for malignancy. No malignant cells identified in the right upper lobe.  Case was discussed at tumor board and given the hypermetabolism in the right upper lobe as well as hilum involvement cannot be ruled out despite negative biopsies.  We are therefore proceeding with her having stage III T3N1 disease with concurrent chemoradiation with weekly CarboTaxol  Interval history-patient tolerated cycle 1 of chemotherapy well without any significant side effects.  She reports mild chronic nonproductive cough but denies any new complaints at this time  ECOG PS- 1 Pain scale- 0   Review of systems- Review of Systems  Constitutional: Positive for malaise/fatigue. Negative for chills, fever and weight loss.  HENT: Negative for congestion, ear discharge and nosebleeds.   Eyes: Negative for blurred vision.  Respiratory: Positive for cough. Negative for hemoptysis, sputum production, shortness of breath and wheezing.   Cardiovascular: Negative for chest pain, palpitations, orthopnea and claudication.  Gastrointestinal: Negative for abdominal pain, blood in stool, constipation, diarrhea, heartburn, melena, nausea and vomiting.  Genitourinary: Negative for dysuria, flank pain, frequency, hematuria and urgency.  Musculoskeletal: Negative for back pain, joint pain and myalgias.  Skin: Negative for rash.  Neurological: Negative for dizziness, tingling, focal weakness, seizures, weakness and headaches.  Endo/Heme/Allergies: Does not bruise/bleed easily.  Psychiatric/Behavioral: Negative for depression and suicidal ideas. The patient does not have insomnia.       No Known Allergies   Past Medical History:  Diagnosis Date  . Anxiety   . Asthma   . Cough   . Dyspnea    with exertion  . GERD (gastroesophageal reflux disease)   . Hypertension      Past Surgical History:  Procedure Laterality Date  . BUNIONECTOMY  Bilateral   . COLONOSCOPY    . PORTA CATH INSERTION  N/A 12/22/2020   Procedure: PORTA CATH INSERTION;  Surgeon: Algernon Huxley, MD;  Location: [place] [facility];  Service: Cardiovascular;  Laterality: N/A;  . VIDEO BRONCHOSCOPY WITH ENDOBRONCHIAL NAVIGATION N/A 11/07/2020   Procedure: VIDEO BRONCHOSCOPY WITH ENDOBRONCHIAL NAVIGATION;  Surgeon: Ottie Glazier, MD;  Location: [facility];  Service: Thoracic;  Laterality: N/A;  . VIDEO BRONCHOSCOPY WITH ENDOBRONCHIAL ULTRASOUND N/A 11/07/2020   Procedure: VIDEO BRONCHOSCOPY WITH ENDOBRONCHIAL ULTRASOUND;  Surgeon: Ottie Glazier, MD;  Location: [facility];  Service: Thoracic;  Laterality: N/A;    Social History   Socioeconomic History  . Marital status: Married    Spouse name: Not on file  . Number of children: Not on file  . Years of education: Not on file  . Highest education level: Not on file  Occupational History  . Not on file  Tobacco Use  . Smoking status: Former Smoker    Packs/day: 1.00    Years: 15.00    Pack years: 15.00    Types: Cigarettes    Quit date: 11/07/1994    Years since quitting: 26.1  . Smokeless tobacco: Never Used  Vaping Use  . Vaping Use: Never used  Substance and Sexual Activity  . Alcohol use: Yes    Comment:  wine daily  . Drug use: Never  . Sexual activity: Not on file  Other Topics Concern  . Not on file  Social History Narrative  . Not on file   Social Determinants of Health   Financial Resource Strain: Not on file  Food Insecurity: Not on file  Transportation Needs: Not on file  Physical Activity: Not on file  Stress: Not on file  Social Connections: Not on file  Intimate Partner Violence: Not on file    No family history on file.   Current Outpatient Medications:  .  ADVAIR DISKUS 250-50 MCG/DOSE AEPB, Inhale 1 puff into the lungs as needed., Disp: , Rfl:  .  ALPRAZolam (XANAX) 0.5 MG tablet, Take 0.5 mg by mouth at bedtime as needed for sleep., Disp: , Rfl:  .  amLODipine (NORVASC) 5 MG tablet, Take 5 mg by mouth at bedtime., Disp: ,  Rfl:  .  Cholecalciferol 50 MCG (2000 UT) CAPS, Take 2,000 Units by mouth daily., Disp: , Rfl:  .  dexamethasone (DECADRON) 4 MG tablet, Take 2 tablets (8 mg total) by mouth daily. Start the day after chemotherapy for 2 days., Disp: 30 tablet, Rfl: 1 .  esomeprazole (NEXIUM) 20 MG capsule, Take 20 mg by mouth as needed., Disp: , Rfl:  .  fluticasone (FLONASE) 50 MCG/ACT nasal spray, Place 2 sprays into both nostrils daily as needed for rhinitis., Disp: , Rfl:  .  lidocaine-prilocaine (EMLA) cream, Apply to affected area once, Disp: 30 g, Rfl: 3 .  LORazepam (ATIVAN) 0.5 MG tablet, Take 1 tablet (0.5 mg total) by mouth every 6 (six) hours as needed (Nausea or vomiting)., Disp: 30 tablet, Rfl: 0 .  metoprolol succinate (TOPROL-XL) 50 MG 24 hr tablet, Take 50 mg by mouth every morning., Disp: , Rfl:  .  olmesartan (BENICAR) 40 MG tablet, Take 40 mg by mouth at bedtime., Disp: , Rfl:  .  ondansetron (ZOFRAN) 8 MG tablet, Take 1 tablet (8 mg total) by mouth 2 (two) times daily as needed for refractory nausea / vomiting. Start on day 3 after chemo., Disp: 30 tablet, Rfl: 1 .  prochlorperazine (COMPAZINE) 10 MG  tablet, Take 1 tablet (10 mg total) by mouth every 6 (six) hours as needed (Nausea or vomiting)., Disp: 30 tablet, Rfl: 1 .  zolpidem (AMBIEN) 10 MG tablet, Take 10 mg by mouth at bedtime as needed for sleep., Disp: , Rfl:  No current facility-administered medications for this visit.  Facility-Administered Medications Ordered in Other Visits:  .  sodium chloride flush (NS) 0.9 % injection 10 mL, 10 mL, Intravenous, Once, Sindy Guadeloupe, MD .  sodium chloride flush (NS) 0.9 % injection 10 mL, 10 mL, Intravenous, PRN, Earlie Server, MD, 10 mL at 12/23/20 0942  Physical exam:  Vitals:   12/23/20 1105  BP: (!) 154/70  Pulse: 71  Resp: 20  Temp: (!) 97.4 F (36.3 C)  TempSrc: Tympanic  SpO2: 99%  Weight: 157 lb 9.6 oz (71.5 kg)   Physical Exam Constitutional:      General: She is not in acute  distress. Cardiovascular:     Rate and Rhythm: Normal rate and regular rhythm.     Heart sounds: Normal heart sounds.  Pulmonary:     Effort: Pulmonary effort is normal.     Breath sounds: Normal breath sounds.  Skin:    General: Skin is warm and dry.  Neurological:     Mental Status: She is alert and oriented to person, place, and time.      CMP Latest Ref Rng & Units 12/23/2020  Glucose 70 - 99 mg/dL 112(H)  BUN 8 - 23 mg/dL 9  Creatinine 0.44 - 1.00 mg/dL 0.52  Sodium 135 - 145 mmol/L 134(L)  Potassium 3.5 - 5.1 mmol/L 4.0  Chloride 98 - 111 mmol/L 100  CO2 22 - 32 mmol/L 23  Calcium 8.9 - 10.3 mg/dL 9.1  Total Protein 6.5 - 8.1 g/dL 7.0  Total Bilirubin 0.3 - 1.2 mg/dL 0.6  Alkaline Phos 38 - 126 U/L 51  AST 15 - 41 U/L 17  ALT 0 - 44 U/L 14   CBC Latest Ref Rng & Units 12/23/2020  WBC 4.0 - 10.5 K/uL 4.1  Hemoglobin 12.0 - 15.0 g/dL 11.3(L)  Hematocrit 36.0 - 46.0 % 33.5(L)  Platelets 150 - 400 K/uL 267    No images are attached to the encounter.  MR Brain W Wo Contrast  Result Date: 12/13/2020 CLINICAL DATA:  Non-small cell lung cancer staging EXAM: MRI HEAD WITHOUT AND WITH CONTRAST TECHNIQUE: Multiplanar, multiecho pulse sequences of the brain and surrounding structures were obtained without and with intravenous contrast. CONTRAST:  102mL GADAVIST GADOBUTROL 1 MMOL/ML IV SOLN COMPARISON:  None. FINDINGS: Brain: No swelling or enhancement to suggest metastatic disease. Mild chronic small vessel ischemic type change in the cerebral white matter. No incidental infarct, hemorrhage, hydrocephalus, or collection. Vascular: Normal flow voids and vascular enhancements Skull and upper cervical spine: Normal marrow signal. Scattered areas of susceptibility artifact along the scalp. Sinuses/Orbits: Negative. IMPRESSION: No evidence of intracranial metastasis. Electronically Signed   By: Monte Fantasia M.D.   On: 12/13/2020 06:23   PERIPHERAL VASCULAR CATHETERIZATION  Result  Date: 12/22/2020 See op note    Assessment and plan- Patient is a 65 y.o. female with adenocarcinoma of the right lung clinical stage III acT3 N1 M0.    She is here for on treatment assessment prior to cycle 2 of weekly CarboTaxol chemotherapy concurrent with radiation  Counts okay to proceed with cycle 2 of weekly CarboTaxol chemotherapy today.  She will directly proceed for cycle 3 next week and will see covering MD/NP in  2 weeks for cycle 4.  I will see her back in 4 weeks for cycle 6.  Plan to repeat scans after concurrent chemoradiation is done  Hyponatremia: Improved.  Continue to monitor   Visit Diagnosis 1. Encounter for antineoplastic chemotherapy   2. Malignant neoplasm of lower lobe of right lung ([place])      Dr. Randa Evens, MD, MPH [facility] [phone] 12/23/2020 3:16 PM

## 2020-12-24 ENCOUNTER — Ambulatory Visit: Payer: BC Managed Care – PPO

## 2020-12-24 ENCOUNTER — Ambulatory Visit
Admission: RE | Admit: 2020-12-24 | Discharge: 2020-12-24 | Disposition: A | Discharge status: Home / Self Care | Payer: BC Managed Care – PPO | Source: Ambulatory Visit | Attending: Radiation Oncology | Admitting: Radiation Oncology

## 2020-12-24 DIAGNOSIS — Z510 Encounter for antineoplastic radiation therapy: Secondary | ICD-10-CM | POA: Diagnosis not present

## 2020-12-25 ENCOUNTER — Ambulatory Visit: Payer: BC Managed Care – PPO

## 2020-12-25 ENCOUNTER — Ambulatory Visit
Admission: RE | Admit: 2020-12-25 | Discharge: 2020-12-25 | Disposition: A | Discharge status: Home / Self Care | Payer: BC Managed Care – PPO | Source: Ambulatory Visit | Attending: Radiation Oncology | Admitting: Radiation Oncology

## 2020-12-25 DIAGNOSIS — Z510 Encounter for antineoplastic radiation therapy: Secondary | ICD-10-CM | POA: Diagnosis not present

## 2020-12-26 ENCOUNTER — Ambulatory Visit
Admission: RE | Admit: 2020-12-26 | Discharge: 2020-12-26 | Disposition: A | Discharge status: Home / Self Care | Payer: BC Managed Care – PPO | Source: Ambulatory Visit | Attending: Radiation Oncology | Admitting: Radiation Oncology

## 2020-12-26 ENCOUNTER — Ambulatory Visit: Payer: BC Managed Care – PPO

## 2020-12-26 DIAGNOSIS — Z510 Encounter for antineoplastic radiation therapy: Secondary | ICD-10-CM | POA: Diagnosis not present

## 2020-12-30 ENCOUNTER — Ambulatory Visit
Admission: RE | Admit: 2020-12-30 | Discharge: 2020-12-30 | Disposition: A | Discharge status: Home / Self Care | Payer: BC Managed Care – PPO | Source: Ambulatory Visit | Attending: Radiation Oncology | Admitting: Radiation Oncology

## 2020-12-30 ENCOUNTER — Other Ambulatory Visit: Payer: BC Managed Care – PPO

## 2020-12-30 ENCOUNTER — Ambulatory Visit: Payer: BC Managed Care – PPO

## 2020-12-30 DIAGNOSIS — Z510 Encounter for antineoplastic radiation therapy: Secondary | ICD-10-CM | POA: Diagnosis not present

## 2020-12-31 ENCOUNTER — Inpatient Hospital Stay: Payer: BC Managed Care – PPO

## 2020-12-31 ENCOUNTER — Ambulatory Visit: Payer: BC Managed Care – PPO

## 2020-12-31 ENCOUNTER — Ambulatory Visit
Admission: RE | Admit: 2020-12-31 | Discharge: 2020-12-31 | Disposition: A | Discharge status: Home / Self Care | Payer: BC Managed Care – PPO | Source: Ambulatory Visit | Attending: Radiation Oncology | Admitting: Radiation Oncology

## 2020-12-31 ENCOUNTER — Other Ambulatory Visit: Payer: Self-pay

## 2020-12-31 ENCOUNTER — Inpatient Hospital Stay: Payer: BC Managed Care – PPO | Attending: Oncology

## 2020-12-31 VITALS — BP 137/68 | HR 81 | Temp 96.8°F | Resp 18 | Wt 154.2 lb

## 2020-12-31 DIAGNOSIS — Z5111 Encounter for antineoplastic chemotherapy: Secondary | ICD-10-CM | POA: Insufficient documentation

## 2020-12-31 DIAGNOSIS — C3431 Malignant neoplasm of lower lobe, right bronchus or lung: Secondary | ICD-10-CM | POA: Insufficient documentation

## 2020-12-31 DIAGNOSIS — Z87891 Personal history of nicotine dependence: Secondary | ICD-10-CM | POA: Diagnosis not present

## 2020-12-31 DIAGNOSIS — Z510 Encounter for antineoplastic radiation therapy: Secondary | ICD-10-CM | POA: Insufficient documentation

## 2020-12-31 DIAGNOSIS — Z5189 Encounter for other specified aftercare: Secondary | ICD-10-CM | POA: Diagnosis not present

## 2020-12-31 LAB — CBC WITH DIFFERENTIAL/PLATELET
Abs Immature Granulocytes: 0.02 10*3/uL (ref 0.00–0.07)
Basophils Absolute: 0 10*3/uL (ref 0.0–0.1)
Basophils Relative: 1 %
Eosinophils Absolute: 0 10*3/uL (ref 0.0–0.5)
Eosinophils Relative: 1 %
HCT: 32.9 % — ABNORMAL LOW (ref 36.0–46.0)
Hemoglobin: 11.3 g/dL — ABNORMAL LOW (ref 12.0–15.0)
Immature Granulocytes: 1 %
Lymphocytes Relative: 28 %
Lymphs Abs: 0.9 10*3/uL (ref 0.7–4.0)
MCH: 32.9 pg (ref 26.0–34.0)
MCHC: 34.3 g/dL (ref 30.0–36.0)
MCV: 95.9 fL (ref 80.0–100.0)
Monocytes Absolute: 0.4 10*3/uL (ref 0.1–1.0)
Monocytes Relative: 14 %
Neutro Abs: 1.9 10*3/uL (ref 1.7–7.7)
Neutrophils Relative %: 55 %
Platelets: 233 10*3/uL (ref 150–400)
RBC: 3.43 MIL/uL — ABNORMAL LOW (ref 3.87–5.11)
RDW: 13.1 % (ref 11.5–15.5)
WBC: 3.3 10*3/uL — ABNORMAL LOW (ref 4.0–10.5)
nRBC: 0 % (ref 0.0–0.2)

## 2020-12-31 LAB — COMPREHENSIVE METABOLIC PANEL
ALT: 14 U/L (ref 0–44)
AST: 18 U/L (ref 15–41)
Albumin: 3.7 g/dL (ref 3.5–5.0)
Alkaline Phosphatase: 53 U/L (ref 38–126)
Anion gap: 11 (ref 5–15)
BUN: 9 mg/dL (ref 8–23)
CO2: 22 mmol/L (ref 22–32)
Calcium: 9 mg/dL (ref 8.9–10.3)
Chloride: 101 mmol/L (ref 98–111)
Creatinine, Ser: 0.81 mg/dL (ref 0.44–1.00)
GFR, Estimated: >60 mL/min (ref >60)
Glucose, Bld: 115 mg/dL — ABNORMAL HIGH (ref 70–99)
Potassium: 4.4 mmol/L (ref 3.5–5.1)
Sodium: 134 mmol/L — ABNORMAL LOW (ref 135–145)
Total Bilirubin: 0.5 mg/dL (ref 0.3–1.2)
Total Protein: 7.1 g/dL (ref 6.5–8.1)

## 2020-12-31 MED ORDER — SODIUM CHLORIDE 0.9 % IV SOLN
20.0000 mg | Freq: Once | INTRAVENOUS | Status: AC
  Administered 2020-12-31: 20 mg via INTRAVENOUS
  Filled 2020-12-31: qty 20

## 2020-12-31 MED ORDER — SODIUM CHLORIDE 0.9 % IV SOLN
212.8000 mg | Freq: Once | INTRAVENOUS | Status: AC
  Administered 2020-12-31: 210 mg via INTRAVENOUS
  Filled 2020-12-31: qty 21

## 2020-12-31 MED ORDER — DIPHENHYDRAMINE HCL 50 MG/ML IJ SOLN
50.0000 mg | Freq: Once | INTRAMUSCULAR | Status: AC
  Administered 2020-12-31: 50 mg via INTRAVENOUS
  Filled 2020-12-31: qty 1

## 2020-12-31 MED ORDER — PALONOSETRON HCL INJECTION 0.25 MG/5ML
0.2500 mg | Freq: Once | INTRAVENOUS | Status: AC
  Administered 2020-12-31: 0.25 mg via INTRAVENOUS
  Filled 2020-12-31: qty 5

## 2020-12-31 MED ORDER — HEPARIN SOD (PORK) LOCK FLUSH 100 UNIT/ML IV SOLN
500.0000 [IU] | Freq: Once | INTRAVENOUS | Status: AC | PRN
  Administered 2020-12-31: 500 [IU]
  Filled 2020-12-31: qty 5

## 2020-12-31 MED ORDER — SODIUM CHLORIDE 0.9 % IV SOLN
Freq: Once | INTRAVENOUS | Status: AC
Start: 2020-12-31 — End: 2020-12-31
  Filled 2020-12-31: qty 250

## 2020-12-31 MED ORDER — FAMOTIDINE 20 MG IN NS 100 ML IVPB
20.0000 mg | Freq: Once | INTRAVENOUS | Status: AC
  Administered 2020-12-31: 20 mg via INTRAVENOUS
  Filled 2020-12-31: qty 100
  Filled 2020-12-31: qty 20

## 2020-12-31 MED ORDER — SODIUM CHLORIDE 0.9 % IV SOLN
45.0000 mg/m2 | Freq: Once | INTRAVENOUS | Status: AC
  Administered 2020-12-31: 84 mg via INTRAVENOUS
  Filled 2020-12-31: qty 14

## 2020-12-31 MED ORDER — HEPARIN SOD (PORK) LOCK FLUSH 100 UNIT/ML IV SOLN
INTRAVENOUS | Status: AC
  Filled 2020-12-31: qty 5

## 2020-12-31 NOTE — Progress Notes (Signed)
Nutrition Follow-up:  Patient with stage III lung cancer.  Patient on concurrent chemotherapy and radiation.   Met with patient during infusion.  Patient reports that appetite is good. Reports some issues with reflux and thought radiation doctor was going to give her a medication that she mixes in water but has not been notified by pharmacy.    Medications: nexium  Labs: reviewed  Anthropometrics:   Weight 154 lb 3.2 oz today  157 lb on 5/24 159 lb on 5/17   NUTRITION DIAGNOSIS: Food and nutrition related knowledge deficit improved   INTERVENTION:  Encouraged patient to talk to radiation nursing regarding prescription medication (sounds like carafate) Patient to continue eating well balanced diet including good sources of protein    MONITORING, EVALUATION, GOAL: weight trends, intake   NEXT VISIT: Monday, June 27 during infusion  Daeton Kluth B. Zenia Resides, [place], [place] Registered Dietitian [phone] (mobile)

## 2020-12-31 NOTE — Patient Instructions (Signed)
[facility]    Discharge Instructions:  Thank you for choosing [facility] to provide your oncology and hematology care.  If you have a lab appointment with the [facility], please go directly to the [facility] and check in at the registration area.  Wear comfortable clothing and clothing appropriate for easy access to any Portacath or PICC line.   We strive to give you quality time with your provider. You may need to reschedule your appointment if you arrive late (15 or more minutes).  Arriving late affects you and other patients whose appointments are after yours.  Also, if you miss three or more appointments without notifying the office, you may be dismissed from the clinic at the provider's discretion.      For prescription refill requests, have your pharmacy contact our office and allow 72 hours for refills to be completed.    Today you received the following chemotherapy and/or immunotherapy agents: Taxol, Carboplatin.      To help prevent nausea and vomiting after your treatment, we encourage you to take your nausea medication as directed.  BELOW ARE SYMPTOMS THAT SHOULD BE REPORTED IMMEDIATELY: . *FEVER GREATER THAN 100.4 F (38 C) OR HIGHER . *CHILLS OR SWEATING . *NAUSEA AND VOMITING THAT IS NOT CONTROLLED WITH YOUR NAUSEA MEDICATION . *UNUSUAL SHORTNESS OF BREATH . *UNUSUAL BRUISING OR BLEEDING . *URINARY PROBLEMS (pain or burning when urinating, or frequent urination) . *BOWEL PROBLEMS (unusual diarrhea, constipation, pain near the anus) . TENDERNESS IN MOUTH AND THROAT WITH OR WITHOUT PRESENCE OF ULCERS (sore throat, sores in mouth, or a toothache) . UNUSUAL RASH, SWELLING OR PAIN  . UNUSUAL VAGINAL DISCHARGE OR ITCHING   Items with * indicate a potential emergency and should be followed up as soon as possible or go to the Emergency Department if any problems should occur.  Please show the CHEMOTHERAPY ALERT CARD or  IMMUNOTHERAPY ALERT CARD at check-in to the Emergency Department and triage nurse.  Should you have questions after your visit or need to cancel or reschedule your appointment, please contact [facility]  [phone] and follow the prompts.  Office hours are 8:00 a.m. to 4:30 p.m. Monday - Friday. Please note that voicemails left after 4:00 p.m. may not be returned until the following business day.  We are closed weekends and major holidays. You have access to a nurse at all times for urgent questions. Please call the main number to the clinic [phone] and follow the prompts.  For any non-urgent questions, you may also contact your provider using MyChart. We now offer e-Visits for anyone 70 and older to request care online for non-urgent symptoms. For details visit [email or web address].   Also download the MyChart app! Go to the app store, search "MyChart", open the app, select [facility], and log in with your MyChart username and password.  Due to Covid, a mask is required upon entering the hospital/clinic. If you do not have a mask, one will be given to you upon arrival. For doctor visits, patients may have 1 support person aged 65 or older with them. For treatment visits, patients cannot have anyone with them due to current Covid guidelines and our immunocompromised population.   Carboplatin injection  What is this medicine? CARBOPLATIN (KAR boe pla tin) is a chemotherapy drug. It targets fast dividing cells, like cancer cells, and causes these cells to die. This medicine is used to treat ovarian cancer and many other  cancers. This medicine may be used for other purposes; ask your health care provider or pharmacist if you have questions. COMMON BRAND NAME(S): Paraplatin What should I tell my health care provider before I take this medicine? They need to know if you have any of these conditions:  blood disorders  hearing problems  kidney  disease  recent or ongoing radiation therapy  an unusual or allergic reaction to carboplatin, cisplatin, other chemotherapy, other medicines, foods, dyes, or preservatives  pregnant or trying to get pregnant  breast-feeding How should I use this medicine? This drug is usually given as an infusion into a vein. It is administered in a hospital or clinic by a specially trained health care professional. Talk to your pediatrician regarding the use of this medicine in children. Special care may be needed. Overdosage: If you think you have taken too much of this medicine contact a poison control center or emergency room at once. NOTE: This medicine is only for you. Do not share this medicine with others. What if I miss a dose? It is important not to miss a dose. Call your doctor or health care professional if you are unable to keep an appointment. What may interact with this medicine?  medicines for seizures  medicines to increase blood counts like filgrastim, pegfilgrastim, sargramostim  some antibiotics like amikacin, gentamicin, neomycin, streptomycin, tobramycin  vaccines Talk to your doctor or health care professional before taking any of these medicines:  acetaminophen  aspirin  ibuprofen  ketoprofen  naproxen This list may not describe all possible interactions. Give your health care provider a list of all the medicines, herbs, non-prescription drugs, or dietary supplements you use. Also tell them if you smoke, drink alcohol, or use illegal drugs. Some items may interact with your medicine. What should I watch for while using this medicine? Your condition will be monitored carefully while you are receiving this medicine. You will need important blood work done while you are taking this medicine. This drug may make you feel generally unwell. This is not uncommon, as chemotherapy can affect healthy cells as well as cancer cells. Report any side effects. Continue your course of  treatment even though you feel ill unless your doctor tells you to stop. In some cases, you may be given additional medicines to help with side effects. Follow all directions for their use. Call your doctor or health care professional for advice if you get a fever, chills or sore throat, or other symptoms of a cold or flu. Do not treat yourself. This drug decreases your body's ability to fight infections. Try to avoid being around people who are sick. This medicine may increase your risk to bruise or bleed. Call your doctor or health care professional if you notice any unusual bleeding. Be careful brushing and flossing your teeth or using a toothpick because you may get an infection or bleed more easily. If you have any dental work done, tell your dentist you are receiving this medicine. Avoid taking products that contain aspirin, acetaminophen, ibuprofen, naproxen, or ketoprofen unless instructed by your doctor. These medicines may hide a fever. Do not become pregnant while taking this medicine. Women should inform their doctor if they wish to become pregnant or think they might be pregnant. There is a potential for serious side effects to an unborn child. Talk to your health care professional or pharmacist for more information. Do not breast-feed an infant while taking this medicine. What side effects may I notice from receiving this medicine?  Side effects that you should report to your doctor or health care professional as soon as possible:  allergic reactions like skin rash, itching or hives, swelling of the face, lips, or tongue  signs of infection - fever or chills, cough, sore throat, pain or difficulty passing urine  signs of decreased platelets or bleeding - bruising, pinpoint red spots on the skin, black, tarry stools, nosebleeds  signs of decreased red blood cells - unusually weak or tired, fainting spells, lightheadedness  breathing problems  changes in hearing  changes in  vision  chest pain  high blood pressure  low blood counts - This drug may decrease the number of white blood cells, red blood cells and platelets. You may be at increased risk for infections and bleeding.  nausea and vomiting  pain, swelling, redness or irritation at the injection site  pain, tingling, numbness in the hands or feet  problems with balance, talking, walking  trouble passing urine or change in the amount of urine Side effects that usually do not require medical attention (report to your doctor or health care professional if they continue or are bothersome):  hair loss  loss of appetite  metallic taste in the mouth or changes in taste This list may not describe all possible side effects. Call your doctor for medical advice about side effects. You may report side effects to FDA at 1-800-FDA-1088. Where should I keep my medicine? This drug is given in a hospital or clinic and will not be stored at home. NOTE: This sheet is a summary. It may not cover all possible information. If you have questions about this medicine, talk to your doctor, pharmacist, or health care provider.  2021 Elsevier/Gold Standard (2007-10-24 14:38:05)  Paclitaxel injection  What is this medicine? PACLITAXEL (PAK li TAX el) is a chemotherapy drug. It targets fast dividing cells, like cancer cells, and causes these cells to die. This medicine is used to treat ovarian cancer, breast cancer, lung cancer, Kaposi's sarcoma, and other cancers. This medicine may be used for other purposes; ask your health care provider or pharmacist if you have questions. COMMON BRAND NAME(S): Onxol, Taxol What should I tell my health care provider before I take this medicine? They need to know if you have any of these conditions:  history of irregular heartbeat  liver disease  low blood counts, like low white cell, platelet, or red cell counts  lung or breathing disease, like asthma  tingling of the fingers  or toes, or other nerve disorder  an unusual or allergic reaction to paclitaxel, alcohol, polyoxyethylated castor oil, other chemotherapy, other medicines, foods, dyes, or preservatives  pregnant or trying to get pregnant  breast-feeding How should I use this medicine? This drug is given as an infusion into a vein. It is administered in a hospital or clinic by a specially trained health care professional. Talk to your pediatrician regarding the use of this medicine in children. Special care may be needed. Overdosage: If you think you have taken too much of this medicine contact a poison control center or emergency room at once. NOTE: This medicine is only for you. Do not share this medicine with others. What if I miss a dose? It is important not to miss your dose. Call your doctor or health care professional if you are unable to keep an appointment. What may interact with this medicine? Do not take this medicine with any of the following medications:  live virus vaccines This medicine may also interact  with the following medications:  antiviral medicines for hepatitis, HIV or AIDS  certain antibiotics like erythromycin and clarithromycin  certain medicines for fungal infections like ketoconazole and itraconazole  certain medicines for seizures like carbamazepine, phenobarbital, phenytoin  gemfibrozil  nefazodone  rifampin  St. John's wort This list may not describe all possible interactions. Give your health care provider a list of all the medicines, herbs, non-prescription drugs, or dietary supplements you use. Also tell them if you smoke, drink alcohol, or use illegal drugs. Some items may interact with your medicine. What should I watch for while using this medicine? Your condition will be monitored carefully while you are receiving this medicine. You will need important blood work done while you are taking this medicine. This medicine can cause serious allergic reactions. To  reduce your risk you will need to take other medicine(s) before treatment with this medicine. If you experience allergic reactions like skin rash, itching or hives, swelling of the face, lips, or tongue, tell your doctor or health care professional right away. In some cases, you may be given additional medicines to help with side effects. Follow all directions for their use. This drug may make you feel generally unwell. This is not uncommon, as chemotherapy can affect healthy cells as well as cancer cells. Report any side effects. Continue your course of treatment even though you feel ill unless your doctor tells you to stop. Call your doctor or health care professional for advice if you get a fever, chills or sore throat, or other symptoms of a cold or flu. Do not treat yourself. This drug decreases your body's ability to fight infections. Try to avoid being around people who are sick. This medicine may increase your risk to bruise or bleed. Call your doctor or health care professional if you notice any unusual bleeding. Be careful brushing and flossing your teeth or using a toothpick because you may get an infection or bleed more easily. If you have any dental work done, tell your dentist you are receiving this medicine. Avoid taking products that contain aspirin, acetaminophen, ibuprofen, naproxen, or ketoprofen unless instructed by your doctor. These medicines may hide a fever. Do not become pregnant while taking this medicine. Women should inform their doctor if they wish to become pregnant or think they might be pregnant. There is a potential for serious side effects to an unborn child. Talk to your health care professional or pharmacist for more information. Do not breast-feed an infant while taking this medicine. Men are advised not to father a child while receiving this medicine. This product may contain alcohol. Ask your pharmacist or healthcare provider if this medicine contains alcohol. Be sure  to tell all healthcare providers you are taking this medicine. Certain medicines, like metronidazole and disulfiram, can cause an unpleasant reaction when taken with alcohol. The reaction includes flushing, headache, nausea, vomiting, sweating, and increased thirst. The reaction can last from 30 minutes to several hours. What side effects may I notice from receiving this medicine? Side effects that you should report to your doctor or health care professional as soon as possible:  allergic reactions like skin rash, itching or hives, swelling of the face, lips, or tongue  breathing problems  changes in vision  fast, irregular heartbeat  high or low blood pressure  mouth sores  pain, tingling, numbness in the hands or feet  signs of decreased platelets or bleeding - bruising, pinpoint red spots on the skin, black, tarry stools, blood in the urine  signs of decreased red blood cells - unusually weak or tired, feeling faint or lightheaded, falls  signs of infection - fever or chills, cough, sore throat, pain or difficulty passing urine  signs and symptoms of liver injury like dark yellow or brown urine; general ill feeling or flu-like symptoms; light-colored stools; loss of appetite; nausea; right upper belly pain; unusually weak or tired; yellowing of the eyes or skin  swelling of the ankles, feet, hands  unusually slow heartbeat Side effects that usually do not require medical attention (report to your doctor or health care professional if they continue or are bothersome):  diarrhea  hair loss  loss of appetite  muscle or joint pain  nausea, vomiting  pain, redness, or irritation at site where injected  tiredness This list may not describe all possible side effects. Call your doctor for medical advice about side effects. You may report side effects to FDA at 1-800-FDA-1088. Where should I keep my medicine? This drug is given in a hospital or clinic and will not be stored at  home. NOTE: This sheet is a summary. It may not cover all possible information. If you have questions about this medicine, talk to your doctor, pharmacist, or health care provider.  2021 Elsevier/Gold Standard (2019-06-20 13:37:23)

## 2021-01-01 ENCOUNTER — Ambulatory Visit
Admission: RE | Admit: 2021-01-01 | Discharge: 2021-01-01 | Disposition: A | Discharge status: Home / Self Care | Payer: BC Managed Care – PPO | Source: Ambulatory Visit | Attending: Radiation Oncology | Admitting: Radiation Oncology

## 2021-01-01 ENCOUNTER — Ambulatory Visit: Payer: BC Managed Care – PPO

## 2021-01-01 DIAGNOSIS — Z510 Encounter for antineoplastic radiation therapy: Secondary | ICD-10-CM | POA: Diagnosis not present

## 2021-01-02 ENCOUNTER — Ambulatory Visit: Payer: BC Managed Care – PPO

## 2021-01-02 ENCOUNTER — Ambulatory Visit
Admission: RE | Admit: 2021-01-02 | Discharge: 2021-01-02 | Disposition: A | Discharge status: Home / Self Care | Payer: BC Managed Care – PPO | Source: Ambulatory Visit | Attending: Radiation Oncology | Admitting: Radiation Oncology

## 2021-01-02 DIAGNOSIS — Z510 Encounter for antineoplastic radiation therapy: Secondary | ICD-10-CM | POA: Diagnosis not present

## 2021-01-05 ENCOUNTER — Ambulatory Visit
Admission: RE | Admit: 2021-01-05 | Discharge: 2021-01-05 | Disposition: A | Discharge status: Home / Self Care | Payer: BC Managed Care – PPO | Source: Ambulatory Visit | Attending: Radiation Oncology | Admitting: Radiation Oncology

## 2021-01-05 ENCOUNTER — Ambulatory Visit: Payer: BC Managed Care – PPO

## 2021-01-05 DIAGNOSIS — Z510 Encounter for antineoplastic radiation therapy: Secondary | ICD-10-CM | POA: Diagnosis not present

## 2021-01-06 ENCOUNTER — Ambulatory Visit: Payer: BC Managed Care – PPO

## 2021-01-06 ENCOUNTER — Inpatient Hospital Stay: Payer: BC Managed Care – PPO

## 2021-01-06 ENCOUNTER — Other Ambulatory Visit: Payer: Self-pay | Admitting: Licensed Clinical Social Worker

## 2021-01-06 ENCOUNTER — Inpatient Hospital Stay (HOSPITAL_BASED_OUTPATIENT_CLINIC_OR_DEPARTMENT_OTHER): Payer: BC Managed Care – PPO | Admitting: Oncology

## 2021-01-06 ENCOUNTER — Ambulatory Visit
Admission: RE | Admit: 2021-01-06 | Discharge: 2021-01-06 | Disposition: A | Discharge status: Home / Self Care | Payer: BC Managed Care – PPO | Source: Ambulatory Visit | Attending: Radiation Oncology | Admitting: Radiation Oncology

## 2021-01-06 ENCOUNTER — Other Ambulatory Visit: Payer: Self-pay

## 2021-01-06 ENCOUNTER — Encounter: Payer: Self-pay | Admitting: Oncology

## 2021-01-06 VITALS — BP 137/73 | HR 76 | Temp 97.3°F | Resp 18 | Wt 154.1 lb

## 2021-01-06 VITALS — BP 140/71 | HR 90

## 2021-01-06 DIAGNOSIS — E871 Hypo-osmolality and hyponatremia: Secondary | ICD-10-CM

## 2021-01-06 DIAGNOSIS — Z510 Encounter for antineoplastic radiation therapy: Secondary | ICD-10-CM | POA: Diagnosis not present

## 2021-01-06 DIAGNOSIS — C3431 Malignant neoplasm of lower lobe, right bronchus or lung: Secondary | ICD-10-CM | POA: Diagnosis not present

## 2021-01-06 DIAGNOSIS — Z5111 Encounter for antineoplastic chemotherapy: Secondary | ICD-10-CM | POA: Diagnosis not present

## 2021-01-06 DIAGNOSIS — R059 Cough, unspecified: Secondary | ICD-10-CM

## 2021-01-06 DIAGNOSIS — Z95828 Presence of other vascular implants and grafts: Secondary | ICD-10-CM

## 2021-01-06 DIAGNOSIS — C3490 Malignant neoplasm of unspecified part of unspecified bronchus or lung: Secondary | ICD-10-CM

## 2021-01-06 LAB — COMPREHENSIVE METABOLIC PANEL
ALT: 17 U/L (ref 0–44)
AST: 19 U/L (ref 15–41)
Albumin: 3.7 g/dL (ref 3.5–5.0)
Alkaline Phosphatase: 50 U/L (ref 38–126)
Anion gap: 13 (ref 5–15)
BUN: 10 mg/dL (ref 8–23)
CO2: 20 mmol/L — ABNORMAL LOW (ref 22–32)
Calcium: 9.2 mg/dL (ref 8.9–10.3)
Chloride: 101 mmol/L (ref 98–111)
Creatinine, Ser: 0.65 mg/dL (ref 0.44–1.00)
GFR, Estimated: >60 mL/min (ref >60)
Glucose, Bld: 121 mg/dL — ABNORMAL HIGH (ref 70–99)
Potassium: 4.3 mmol/L (ref 3.5–5.1)
Sodium: 134 mmol/L — ABNORMAL LOW (ref 135–145)
Total Bilirubin: 0.5 mg/dL (ref 0.3–1.2)
Total Protein: 7.2 g/dL (ref 6.5–8.1)

## 2021-01-06 LAB — CBC WITH DIFFERENTIAL/PLATELET
Abs Immature Granulocytes: 0.03 10*3/uL (ref 0.00–0.07)
Basophils Absolute: 0 10*3/uL (ref 0.0–0.1)
Basophils Relative: 1 %
Eosinophils Absolute: 0 10*3/uL (ref 0.0–0.5)
Eosinophils Relative: 1 %
HCT: 35.8 % — ABNORMAL LOW (ref 36.0–46.0)
Hemoglobin: 12.3 g/dL (ref 12.0–15.0)
Immature Granulocytes: 1 %
Lymphocytes Relative: 25 %
Lymphs Abs: 0.8 10*3/uL (ref 0.7–4.0)
MCH: 32.4 pg (ref 26.0–34.0)
MCHC: 34.4 g/dL (ref 30.0–36.0)
MCV: 94.2 fL (ref 80.0–100.0)
Monocytes Absolute: 0.3 10*3/uL (ref 0.1–1.0)
Monocytes Relative: 9 %
Neutro Abs: 2 10*3/uL (ref 1.7–7.7)
Neutrophils Relative %: 63 %
Platelets: 205 10*3/uL (ref 150–400)
RBC: 3.8 MIL/uL — ABNORMAL LOW (ref 3.87–5.11)
RDW: 12.9 % (ref 11.5–15.5)
WBC: 3.3 10*3/uL — ABNORMAL LOW (ref 4.0–10.5)
nRBC: 0 % (ref 0.0–0.2)

## 2021-01-06 MED ORDER — LANSOPRAZOLE 30 MG PO CPDR: 30.0000 mg | DELAYED_RELEASE_CAPSULE | Freq: Every day | ORAL | 2 refills | Status: DC

## 2021-01-06 MED ORDER — DIPHENHYDRAMINE HCL 50 MG/ML IJ SOLN
50.0000 mg | Freq: Once | INTRAMUSCULAR | Status: AC
Start: 2021-01-06 — End: 2021-01-06
  Administered 2021-01-06: 50 mg via INTRAVENOUS
  Filled 2021-01-06: qty 1

## 2021-01-06 MED ORDER — SODIUM CHLORIDE 0.9 % IV SOLN
214.8000 mg | Freq: Once | INTRAVENOUS | Status: AC
  Administered 2021-01-06: 210 mg via INTRAVENOUS
  Filled 2021-01-06: qty 21

## 2021-01-06 MED ORDER — SODIUM CHLORIDE 0.9 % IV SOLN
45.0000 mg/m2 | Freq: Once | INTRAVENOUS | Status: AC
  Administered 2021-01-06: 84 mg via INTRAVENOUS
  Filled 2021-01-06: qty 14

## 2021-01-06 MED ORDER — SODIUM CHLORIDE 0.9 % IV SOLN
20.0000 mg | Freq: Once | INTRAVENOUS | Status: AC
  Administered 2021-01-06: 20 mg via INTRAVENOUS
  Filled 2021-01-06: qty 20

## 2021-01-06 MED ORDER — SODIUM CHLORIDE 0.9 % IV SOLN
Freq: Once | INTRAVENOUS | Status: AC
  Filled 2021-01-06: qty 250

## 2021-01-06 MED ORDER — FAMOTIDINE 20 MG IN NS 100 ML IVPB
20.0000 mg | Freq: Once | INTRAVENOUS | Status: AC
  Administered 2021-01-06: 20 mg via INTRAVENOUS
  Filled 2021-01-06: qty 20

## 2021-01-06 MED ORDER — HEPARIN SOD (PORK) LOCK FLUSH 100 UNIT/ML IV SOLN
500.0000 [IU] | Freq: Once | INTRAVENOUS | Status: DC
  Filled 2021-01-06: qty 5

## 2021-01-06 MED ORDER — HYDROCOD POLST-CPM POLST ER 10-8 MG/5ML PO SUER: 5.0000 mL | Freq: Two times a day (BID) | ORAL | 0 refills | Status: DC | PRN

## 2021-01-06 MED ORDER — SODIUM CHLORIDE 0.9% FLUSH
10.0000 mL | Freq: Once | INTRAVENOUS | Status: AC
  Administered 2021-01-06: 10 mL via INTRAVENOUS
  Filled 2021-01-06: qty 10

## 2021-01-06 MED ORDER — PALONOSETRON HCL INJECTION 0.25 MG/5ML
0.2500 mg | Freq: Once | INTRAVENOUS | Status: AC
  Administered 2021-01-06: 0.25 mg via INTRAVENOUS
  Filled 2021-01-06: qty 5

## 2021-01-06 MED ORDER — HEPARIN SOD (PORK) LOCK FLUSH 100 UNIT/ML IV SOLN
INTRAVENOUS | Status: AC
  Filled 2021-01-06: qty 5

## 2021-01-06 MED ORDER — HEPARIN SOD (PORK) LOCK FLUSH 100 UNIT/ML IV SOLN
500.0000 [IU] | Freq: Once | INTRAVENOUS | Status: AC | PRN
  Administered 2021-01-06: 500 [IU]
  Filled 2021-01-06: qty 5

## 2021-01-06 NOTE — Progress Notes (Signed)
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 She quit smoking about 26 years ago. Other medical problems include hypertension and hyponatremia.She has been treated for iron of possible pneumonia for the last 1 year. She has a history of intermittent asthma for which she uses Advair. She was seen by Dr. And underwent CT chest which showed dense infiltrate/solid mass in the right lower lobe with contiguous airspace opacity in the right upper lobe. Findings could be secondary to pneumonia but concerning for bronchogenic carcinoma. Patient then had a PET CT scan which showed consolidation in the medial aspect of the right lower lobe measuring 3.4 x 4 cm with an SUV of 11.6. Hypermetabolic masslike thickening in the right hilum measuring 2.2 cm with intense hypermetabolic activity. Groundglass airspace consolidation in the posterior aspect of the right upper lobe with an SUV of 11.4. The right upper lobe opacity was nonspecific and differentials include pulmonary infection versus lymphangitic spread of carcinoma.CT chest showed showed masslike area of distortion involving right lower lobe middle lobe as well as right upper lobe. Generalized right hilar masslike appearance concerning for nodal involvement.  Patient underwent bronchoscopy and biopsies.Right lower lobe was concerning for adenocarcinoma. Lymph node  station 11 R, 4R, 7 were negative for malignancy. No malignant cells identified in the right upper lobe.  Case was discussed at tumor board and given the hypermetabolism in the right upper lobe as well as hilum involvement cannot be ruled out despite negative biopsies.  We are therefore proceeding with her having stage III T3N1 disease with concurrent chemoradiation with weekly CarboTaxol  Interval history-she continues to tolerate radiation and treatment well.  She has noticed some fatigue which is tolerable.  Has a slightly worse dry cough.  She has a great appetite.  Reports that she is not taking her prescribed dexamethasone following treatments due to side effects (irritability).  She discussed with Dr. Janese Banks.  She also has developed some acid reflux type symptoms.  Dr. Donella Stade is prescribing her Protonix.  ECOG PS- 1 Pain scale- 0   Review of systems- Review of Systems  Constitutional: Positive for malaise/fatigue. Negative for chills, fever and weight loss.  HENT: Negative for congestion, ear discharge and nosebleeds.   Eyes: Negative for blurred vision.  Respiratory: Positive for cough. Negative for hemoptysis, sputum production, shortness of breath and wheezing.   Cardiovascular: Negative for chest pain, palpitations, orthopnea and claudication.  Gastrointestinal: Negative for abdominal pain, blood in stool, constipation, diarrhea, heartburn, melena, nausea and vomiting.  Genitourinary: Negative for dysuria, flank pain, frequency, hematuria and urgency.  Musculoskeletal: Negative for back pain, joint pain and myalgias.  Skin: Negative for rash.  Neurological: Negative for dizziness, tingling, focal weakness, seizures, weakness and headaches.  Endo/Heme/Allergies: Does not bruise/bleed easily.  Psychiatric/Behavioral: Negative for depression and suicidal ideas. The patient does not have insomnia.       No Known Allergies   Past Medical History:  Diagnosis Date  .  Anxiety   .  Asthma   . Cough   . Dyspnea    with exertion  . GERD (gastroesophageal reflux disease)   . Hypertension      Past Surgical History:  Procedure Laterality Date  . BUNIONECTOMY Bilateral   . COLONOSCOPY    . PORTA CATH INSERTION N/A 12/22/2020   Procedure: PORTA CATH INSERTION;  Surgeon: Algernon Huxley, MD;  Location: [facility];  Service: Cardiovascular;  Laterality: N/A;  . VIDEO BRONCHOSCOPY WITH ENDOBRONCHIAL NAVIGATION N/A 11/07/2020   Procedure: VIDEO BRONCHOSCOPY WITH ENDOBRONCHIAL NAVIGATION;  Surgeon: Ottie Glazier, MD;  Location: [facility];  Service: Thoracic;  Laterality: N/A;  . VIDEO BRONCHOSCOPY WITH ENDOBRONCHIAL ULTRASOUND N/A 11/07/2020   Procedure: VIDEO BRONCHOSCOPY WITH ENDOBRONCHIAL ULTRASOUND;  Surgeon: Ottie Glazier, MD;  Location: [facility];  Service: Thoracic;  Laterality: N/A;    Social History   Socioeconomic History  . Marital status: Married    Spouse name: Not on file  . Number of children: Not on file  . Years of education: Not on file  . Highest education level: Not on file  Occupational History  . Not on file  Tobacco Use  . Smoking status: Former Smoker    Packs/day: 1.00    Years: 15.00    Pack years: 15.00    Types: Cigarettes    Quit date: 11/07/1994    Years since quitting: 26.1  . Smokeless tobacco: Never Used  Vaping Use  . Vaping Use: Never used  Substance and Sexual Activity  . Alcohol use: Yes    Comment:  wine daily  . Drug use: Never  . Sexual activity: Not on file  Other Topics Concern  . Not on file  Social History Narrative  . Not on file   Social Determinants of Health   Financial Resource Strain: Not on file  Food Insecurity: Not on file  Transportation Needs: Not on file  Physical Activity: Not on file  Stress: Not on file  Social Connections: Not on file  Intimate Partner Violence: Not on file    History reviewed. No pertinent family history.   Current Outpatient Medications:  .  ADVAIR DISKUS  250-50 MCG/DOSE AEPB, Inhale 1 puff into the lungs as needed., Disp: , Rfl:  .  ALPRAZolam (XANAX) 0.5 MG tablet, Take 0.5 mg by mouth at bedtime as needed for sleep., Disp: , Rfl:  .  amLODipine (NORVASC) 5 MG tablet, Take 5 mg by mouth at bedtime., Disp: , Rfl:  .  chlorpheniramine-HYDROcodone (TUSSIONEX) 10-8 MG/5ML SUER, Take 5 mLs by mouth every 12 (twelve) hours as needed for cough., Disp: 140 mL, Rfl: 0 .  Cholecalciferol 50 MCG (2000 UT) CAPS, Take 2,000 Units by mouth daily., Disp: , Rfl:  .  dexamethasone (DECADRON) 4 MG tablet, Take 2 tablets (8 mg total) by mouth daily. Start the day after chemotherapy for 2 days., Disp: 30 tablet, Rfl: 1 .  esomeprazole (NEXIUM) 20 MG capsule, Take 20 mg by mouth as needed., Disp: , Rfl:  .  fluticasone (FLONASE) 50 MCG/ACT nasal spray, Place 2 sprays into both nostrils daily as needed for rhinitis., Disp: , Rfl:  .  lansoprazole (PREVACID) 30 MG capsule, Take 1 capsule (30 mg total) by mouth daily at 12 noon., Disp: 30 capsule, Rfl: 2 .  lidocaine-prilocaine (EMLA) cream, Apply to affected area once, Disp: 30 g, Rfl: 3 .  LORazepam (ATIVAN) 0.5 MG tablet, Take 1 tablet (0.5 mg total) by mouth every 6 (six)  hours as needed (Nausea or vomiting)., Disp: 30 tablet, Rfl: 0 .  metoprolol succinate (TOPROL-XL) 50 MG 24 hr tablet, Take 50 mg by mouth every morning., Disp: , Rfl:  .  olmesartan (BENICAR) 40 MG tablet, Take 40 mg by mouth at bedtime., Disp: , Rfl:  .  ondansetron (ZOFRAN) 8 MG tablet, Take 1 tablet (8 mg total) by mouth 2 (two) times daily as needed for refractory nausea / vomiting. Start on day 3 after chemo., Disp: 30 tablet, Rfl: 1 .  prochlorperazine (COMPAZINE) 10 MG tablet, Take 1 tablet (10 mg total) by mouth every 6 (six) hours as needed (Nausea or vomiting)., Disp: 30 tablet, Rfl: 1 .  zolpidem (AMBIEN) 10 MG tablet, Take 10 mg by mouth at bedtime as needed for sleep., Disp: , Rfl:  No current facility-administered medications for this  visit.  Facility-Administered Medications Ordered in Other Visits:  .  CARBOplatin (PARAPLATIN) 210 mg in sodium chloride 0.9 % 250 mL chemo infusion, 210 mg, Intravenous, Once, Sindy Guadeloupe, MD, Last Rate: 542 mL/hr at 01/06/21 1204, 210 mg at 01/06/21 1204 .  heparin lock flush 100 unit/mL, 500 Units, Intracatheter, Once PRN, Sindy Guadeloupe, MD .  sodium chloride flush (NS) 0.9 % injection 10 mL, 10 mL, Intravenous, Once, Sindy Guadeloupe, MD  Physical exam:  Vitals:   01/06/21 0854  BP: 137/73  Pulse: 76  Resp: 18  Temp: (!) 97.3 F (36.3 C)  SpO2: 99%  Weight: 154 lb 1.6 oz (69.9 kg)   Physical Exam Constitutional:      General: She is not in acute distress. Cardiovascular:     Rate and Rhythm: Normal rate and regular rhythm.     Heart sounds: Normal heart sounds.  Pulmonary:     Effort: Pulmonary effort is normal.     Breath sounds: Normal breath sounds.  Skin:    General: Skin is warm and dry.  Neurological:     Mental Status: She is alert and oriented to person, place, and time.      CMP Latest Ref Rng & Units 01/06/2021  Glucose 70 - 99 mg/dL 121(H)  BUN 8 - 23 mg/dL 10  Creatinine 0.44 - 1.00 mg/dL 0.65  Sodium 135 - 145 mmol/L 134(L)  Potassium 3.5 - 5.1 mmol/L 4.3  Chloride 98 - 111 mmol/L 101  CO2 22 - 32 mmol/L 20(L)  Calcium 8.9 - 10.3 mg/dL 9.2  Total Protein 6.5 - 8.1 g/dL 7.2  Total Bilirubin 0.3 - 1.2 mg/dL 0.5  Alkaline Phos 38 - 126 U/L 50  AST 15 - 41 U/L 19  ALT 0 - 44 U/L 17   CBC Latest Ref Rng & Units 01/06/2021  WBC 4.0 - 10.5 K/uL 3.3(L)  Hemoglobin 12.0 - 15.0 g/dL 12.3  Hematocrit 36.0 - 46.0 % 35.8(L)  Platelets 150 - 400 K/uL 205    No images are attached to the encounter.  MR Brain W Wo Contrast  Result Date: 12/13/2020 CLINICAL DATA:  Non-small cell lung cancer staging EXAM: MRI HEAD WITHOUT AND WITH CONTRAST TECHNIQUE: Multiplanar, multiecho pulse sequences of the brain and surrounding structures were obtained without and with  intravenous contrast. CONTRAST:  15mL GADAVIST GADOBUTROL 1 MMOL/ML IV SOLN COMPARISON:  None. FINDINGS: Brain: No swelling or enhancement to suggest metastatic disease. Mild chronic small vessel ischemic type change in the cerebral white matter. No incidental infarct, hemorrhage, hydrocephalus, or collection. Vascular: Normal flow voids and vascular enhancements Skull and upper cervical spine: Normal marrow  signal. Scattered areas of susceptibility artifact along the scalp. Sinuses/Orbits: Negative. IMPRESSION: No evidence of intracranial metastasis. Electronically Signed   By: Monte Fantasia M.D.   On: 12/13/2020 06:23   PERIPHERAL VASCULAR CATHETERIZATION  Result Date: 12/22/2020 See op note    Assessment and plan- Patient is a 65 y.o. female with adenocarcinoma of the right lung clinical stage III acT3 N1 M0.    She is here for on treatment assessment prior to cycle 4 of weekly CarboTaxol chemotherapy concurrent with radiation  Counts okay to proceed with cycle 4 of weekly CarboTaxol chemotherapy today.  She will return to clinic next week for cycle 5 and will see Dr. Janese Banks prior to cycle 6.  Plan is to repeat scans after concurrent chemoradiation is complete.  Cough: Feels this is worse.  Can try Tussionex at bedtime.  New prescription sent to pharmacy.  GERD: She was started on Protonix by Dr. Donella Stade.  She self discontinued dexamethasone following her treatment secondary to side effects.    Visit Diagnosis 1. Malignant neoplasm of lower lobe of right lung ([place])   2. Hyponatremia   3. Cough     Greater than 50% was spent in counseling and coordination of care with this patient including but not limited to discussion of the relevant topics above (See A&P) including, but not limited to diagnosis and management of acute and chronic medical conditions.   Faythe Casa, NP 01/06/2021 12:15 PM

## 2021-01-06 NOTE — Patient Instructions (Signed)
[facility]  Discharge Instructions: Thank you for choosing [facility] to provide your oncology and hematology care.  If you have a lab appointment with the [facility], please go directly to the [facility] and check in at the registration area.  Wear comfortable clothing and clothing appropriate for easy access to any Portacath or PICC line.   We strive to give you quality time with your provider. You may need to reschedule your appointment if you arrive late (15 or more minutes).  Arriving late affects you and other patients whose appointments are after yours.  Also, if you miss three or more appointments without notifying the office, you may be dismissed from the clinic at the provider's discretion.      For prescription refill requests, have your pharmacy contact our office and allow 72 hours for refills to be completed.    Today you received the following chemotherapy and/or immunotherapy agents       To help prevent nausea and vomiting after your treatment, we encourage you to take your nausea medication as directed.  BELOW ARE SYMPTOMS THAT SHOULD BE REPORTED IMMEDIATELY: . *FEVER GREATER THAN 100.4 F (38 C) OR HIGHER . *CHILLS OR SWEATING . *NAUSEA AND VOMITING THAT IS NOT CONTROLLED WITH YOUR NAUSEA MEDICATION . *UNUSUAL SHORTNESS OF BREATH . *UNUSUAL BRUISING OR BLEEDING . *URINARY PROBLEMS (pain or burning when urinating, or frequent urination) . *BOWEL PROBLEMS (unusual diarrhea, constipation, pain near the anus) . TENDERNESS IN MOUTH AND THROAT WITH OR WITHOUT PRESENCE OF ULCERS (sore throat, sores in mouth, or a toothache) . UNUSUAL RASH, SWELLING OR PAIN  . UNUSUAL VAGINAL DISCHARGE OR ITCHING   Items with * indicate a potential emergency and should be followed up as soon as possible or go to the Emergency Department if any problems should occur.  Please show the CHEMOTHERAPY ALERT CARD or IMMUNOTHERAPY ALERT CARD at  check-in to the Emergency Department and triage nurse.  Should you have questions after your visit or need to cancel or reschedule your appointment, please contact [facility]  [phone] and follow the prompts.  Office hours are 8:00 a.m. to 4:30 p.m. Monday - Friday. Please note that voicemails left after 4:00 p.m. may not be returned until the following business day.  We are closed weekends and major holidays. You have access to a nurse at all times for urgent questions. Please call the main number to the clinic [phone] and follow the prompts.  For any non-urgent questions, you may also contact your provider using MyChart. We now offer e-Visits for anyone 64 and older to request care online for non-urgent symptoms. For details visit [email or web address].   Also download the MyChart app! Go to the app store, search "MyChart", open the app, select [facility], and log in with your MyChart username and password.  Due to Covid, a mask is required upon entering the hospital/clinic. If you do not have a mask, one will be given to you upon arrival. For doctor visits, patients may have 1 support person aged 64 or older with them. For treatment visits, patients cannot have anyone with them due to current Covid guidelines and our immunocompromised population.

## 2021-01-06 NOTE — Progress Notes (Signed)
Prevacid

## 2021-01-06 NOTE — Progress Notes (Signed)
Pt here for follow up. No new concerns voiced.   

## 2021-01-07 ENCOUNTER — Ambulatory Visit
Admission: RE | Admit: 2021-01-07 | Discharge: 2021-01-07 | Disposition: A | Discharge status: Home / Self Care | Payer: BC Managed Care – PPO | Source: Ambulatory Visit | Attending: Radiation Oncology | Admitting: Radiation Oncology

## 2021-01-07 ENCOUNTER — Ambulatory Visit: Payer: BC Managed Care – PPO

## 2021-01-07 DIAGNOSIS — Z510 Encounter for antineoplastic radiation therapy: Secondary | ICD-10-CM | POA: Diagnosis not present

## 2021-01-08 ENCOUNTER — Ambulatory Visit: Payer: BC Managed Care – PPO

## 2021-01-08 ENCOUNTER — Ambulatory Visit
Admission: RE | Admit: 2021-01-08 | Discharge: 2021-01-08 | Disposition: A | Discharge status: Home / Self Care | Payer: BC Managed Care – PPO | Source: Ambulatory Visit | Attending: Radiation Oncology | Admitting: Radiation Oncology

## 2021-01-08 DIAGNOSIS — Z510 Encounter for antineoplastic radiation therapy: Secondary | ICD-10-CM | POA: Diagnosis not present

## 2021-01-09 ENCOUNTER — Ambulatory Visit
Admission: RE | Admit: 2021-01-09 | Discharge: 2021-01-09 | Disposition: A | Discharge status: Home / Self Care | Payer: BC Managed Care – PPO | Source: Ambulatory Visit | Attending: Radiation Oncology | Admitting: Radiation Oncology

## 2021-01-09 ENCOUNTER — Ambulatory Visit: Payer: BC Managed Care – PPO

## 2021-01-09 DIAGNOSIS — Z510 Encounter for antineoplastic radiation therapy: Secondary | ICD-10-CM | POA: Diagnosis not present

## 2021-01-12 ENCOUNTER — Ambulatory Visit: Payer: BC Managed Care – PPO

## 2021-01-12 ENCOUNTER — Ambulatory Visit
Admission: RE | Admit: 2021-01-12 | Discharge: 2021-01-12 | Disposition: A | Discharge status: Home / Self Care | Payer: BC Managed Care – PPO | Source: Ambulatory Visit | Attending: Radiation Oncology | Admitting: Radiation Oncology

## 2021-01-12 DIAGNOSIS — Z510 Encounter for antineoplastic radiation therapy: Secondary | ICD-10-CM | POA: Diagnosis not present

## 2021-01-13 ENCOUNTER — Ambulatory Visit
Admission: RE | Admit: 2021-01-13 | Discharge: 2021-01-13 | Disposition: A | Discharge status: Home / Self Care | Payer: BC Managed Care – PPO | Source: Ambulatory Visit | Attending: Radiation Oncology | Admitting: Radiation Oncology

## 2021-01-13 ENCOUNTER — Ambulatory Visit: Payer: BC Managed Care – PPO

## 2021-01-13 ENCOUNTER — Inpatient Hospital Stay: Payer: BC Managed Care – PPO

## 2021-01-13 VITALS — BP 138/75 | HR 92 | Temp 97.6°F | Resp 16

## 2021-01-13 DIAGNOSIS — C3431 Malignant neoplasm of lower lobe, right bronchus or lung: Secondary | ICD-10-CM

## 2021-01-13 DIAGNOSIS — Z5111 Encounter for antineoplastic chemotherapy: Secondary | ICD-10-CM | POA: Diagnosis not present

## 2021-01-13 DIAGNOSIS — Z510 Encounter for antineoplastic radiation therapy: Secondary | ICD-10-CM | POA: Diagnosis not present

## 2021-01-13 LAB — COMPREHENSIVE METABOLIC PANEL
ALT: 16 U/L (ref 0–44)
AST: 21 U/L (ref 15–41)
Albumin: 3.8 g/dL (ref 3.5–5.0)
Alkaline Phosphatase: 47 U/L (ref 38–126)
Anion gap: 11 (ref 5–15)
BUN: 10 mg/dL (ref 8–23)
CO2: 23 mmol/L (ref 22–32)
Calcium: 9.2 mg/dL (ref 8.9–10.3)
Chloride: 100 mmol/L (ref 98–111)
Creatinine, Ser: 0.68 mg/dL (ref 0.44–1.00)
GFR, Estimated: >60 mL/min (ref >60)
Glucose, Bld: 133 mg/dL — ABNORMAL HIGH (ref 70–99)
Potassium: 3.8 mmol/L (ref 3.5–5.1)
Sodium: 134 mmol/L — ABNORMAL LOW (ref 135–145)
Total Bilirubin: 0.6 mg/dL (ref 0.3–1.2)
Total Protein: 7.3 g/dL (ref 6.5–8.1)

## 2021-01-13 LAB — CBC WITH DIFFERENTIAL/PLATELET
Abs Immature Granulocytes: 0.02 10*3/uL (ref 0.00–0.07)
Basophils Absolute: 0 10*3/uL (ref 0.0–0.1)
Basophils Relative: 1 %
Eosinophils Absolute: 0 10*3/uL (ref 0.0–0.5)
Eosinophils Relative: 1 %
HCT: 33.4 % — ABNORMAL LOW (ref 36.0–46.0)
Hemoglobin: 11.6 g/dL — ABNORMAL LOW (ref 12.0–15.0)
Immature Granulocytes: 1 %
Lymphocytes Relative: 24 %
Lymphs Abs: 0.8 10*3/uL (ref 0.7–4.0)
MCH: 33 pg (ref 26.0–34.0)
MCHC: 34.7 g/dL (ref 30.0–36.0)
MCV: 95.2 fL (ref 80.0–100.0)
Monocytes Absolute: 0.3 10*3/uL (ref 0.1–1.0)
Monocytes Relative: 10 %
Neutro Abs: 2 10*3/uL (ref 1.7–7.7)
Neutrophils Relative %: 63 %
Platelets: 174 10*3/uL (ref 150–400)
RBC: 3.51 MIL/uL — ABNORMAL LOW (ref 3.87–5.11)
RDW: 12.8 % (ref 11.5–15.5)
WBC: 3.1 10*3/uL — ABNORMAL LOW (ref 4.0–10.5)
nRBC: 0 % (ref 0.0–0.2)

## 2021-01-13 MED ORDER — SODIUM CHLORIDE 0.9 % IV SOLN
20.0000 mg | Freq: Once | INTRAVENOUS | Status: AC
  Administered 2021-01-13: 20 mg via INTRAVENOUS
  Filled 2021-01-13: qty 20

## 2021-01-13 MED ORDER — FAMOTIDINE 20 MG IN NS 100 ML IVPB
20.0000 mg | Freq: Once | INTRAVENOUS | Status: AC
  Administered 2021-01-13: 20 mg via INTRAVENOUS
  Filled 2021-01-13: qty 100
  Filled 2021-01-13: qty 20

## 2021-01-13 MED ORDER — SODIUM CHLORIDE 0.9 % IV SOLN
45.0000 mg/m2 | Freq: Once | INTRAVENOUS | Status: AC
  Administered 2021-01-13: 84 mg via INTRAVENOUS
  Filled 2021-01-13: qty 14

## 2021-01-13 MED ORDER — SODIUM CHLORIDE 0.9 % IV SOLN
Freq: Once | INTRAVENOUS | Status: AC
Start: 2021-01-13 — End: 2021-01-13
  Filled 2021-01-13: qty 250

## 2021-01-13 MED ORDER — DIPHENHYDRAMINE HCL 50 MG/ML IJ SOLN
50.0000 mg | Freq: Once | INTRAMUSCULAR | Status: AC
  Administered 2021-01-13: 50 mg via INTRAVENOUS
  Filled 2021-01-13: qty 1

## 2021-01-13 MED ORDER — PALONOSETRON HCL INJECTION 0.25 MG/5ML
0.2500 mg | Freq: Once | INTRAVENOUS | Status: AC
  Administered 2021-01-13: 0.25 mg via INTRAVENOUS
  Filled 2021-01-13: qty 5

## 2021-01-13 MED ORDER — HEPARIN SOD (PORK) LOCK FLUSH 100 UNIT/ML IV SOLN
INTRAVENOUS | Status: AC
  Filled 2021-01-13: qty 5

## 2021-01-13 MED ORDER — SODIUM CHLORIDE 0.9 % IV SOLN
214.8000 mg | Freq: Once | INTRAVENOUS | Status: AC
  Administered 2021-01-13: 210 mg via INTRAVENOUS
  Filled 2021-01-13: qty 21

## 2021-01-13 MED ORDER — HEPARIN SOD (PORK) LOCK FLUSH 100 UNIT/ML IV SOLN
500.0000 [IU] | Freq: Once | INTRAVENOUS | Status: AC | PRN
  Administered 2021-01-13: 500 [IU]
  Filled 2021-01-13: qty 5

## 2021-01-13 NOTE — Patient Instructions (Signed)
[facility]  Discharge Instructions: Thank you for choosing [facility] to provide your oncology and hematology care.  If you have a lab appointment with the Cancer Center, please go directly to the Cancer Center and check in at the registration area.  Wear comfortable clothing and clothing appropriate for easy access to any Portacath or PICC line.   We strive to give you quality time with your provider. You may need to reschedule your appointment if you arrive late (15 or more minutes).  Arriving late affects you and other patients whose appointments are after yours.  Also, if you miss three or more appointments without notifying the office, you may be dismissed from the clinic at the provider's discretion.      For prescription refill requests, have your pharmacy contact our office and allow 72 hours for refills to be completed.    Today you received the following chemotherapy and/or immunotherapy agents Carboplatin & Taxol      To help prevent nausea and vomiting after your treatment, we encourage you to take your nausea medication as directed.  BELOW ARE SYMPTOMS THAT SHOULD BE REPORTED IMMEDIATELY: *FEVER GREATER THAN 100.4 F (38 C) OR HIGHER *CHILLS OR SWEATING *NAUSEA AND VOMITING THAT IS NOT CONTROLLED WITH YOUR NAUSEA MEDICATION *UNUSUAL SHORTNESS OF BREATH *UNUSUAL BRUISING OR BLEEDING *URINARY PROBLEMS (pain or burning when urinating, or frequent urination) *BOWEL PROBLEMS (unusual diarrhea, constipation, pain near the anus) TENDERNESS IN MOUTH AND THROAT WITH OR WITHOUT PRESENCE OF ULCERS (sore throat, sores in mouth, or a toothache) UNUSUAL RASH, SWELLING OR PAIN  UNUSUAL VAGINAL DISCHARGE OR ITCHING   Items with * indicate a potential emergency and should be followed up as soon as possible or go to the Emergency Department if any problems should occur.  Please show the CHEMOTHERAPY ALERT CARD or IMMUNOTHERAPY ALERT CARD at  check-in to the Emergency Department and triage nurse.  Should you have questions after your visit or need to cancel or reschedule your appointment, please contact [facility]  [phone] and follow the prompts.  Office hours are 8:00 a.m. to 4:30 p.m. Monday - Friday. Please note that voicemails left after 4:00 p.m. may not be returned until the following business day.  We are closed weekends and major holidays. You have access to a nurse at all times for urgent questions. Please call the main number to the clinic [phone] and follow the prompts.  For any non-urgent questions, you may also contact your provider using MyChart. We now offer e-Visits for anyone 18 and older to request care online for non-urgent symptoms. For details visit [email or web address].   Also download the MyChart app! Go to the app store, search "MyChart", open the app, select [facility], and log in with your MyChart username and password.  Due to Covid, a mask is required upon entering the hospital/clinic. If you do not have a mask, one will be given to you upon arrival. For doctor visits, patients may have 1 support person aged 18 or older with them. For treatment visits, patients cannot have anyone with them due to current Covid guidelines and our immunocompromised population.  

## 2021-01-13 NOTE — Progress Notes (Signed)
HR 107. Dr. Janese Banks made aware. Per Dr. Janese Banks, proceed with treatment.

## 2021-01-14 ENCOUNTER — Ambulatory Visit: Payer: BC Managed Care – PPO

## 2021-01-14 ENCOUNTER — Ambulatory Visit
Admission: RE | Admit: 2021-01-14 | Discharge: 2021-01-14 | Disposition: A | Discharge status: Home / Self Care | Payer: BC Managed Care – PPO | Source: Ambulatory Visit | Attending: Radiation Oncology | Admitting: Radiation Oncology

## 2021-01-14 DIAGNOSIS — Z510 Encounter for antineoplastic radiation therapy: Secondary | ICD-10-CM | POA: Diagnosis not present

## 2021-01-15 ENCOUNTER — Ambulatory Visit: Payer: BC Managed Care – PPO

## 2021-01-15 ENCOUNTER — Ambulatory Visit
Admission: RE | Admit: 2021-01-15 | Discharge: 2021-01-15 | Disposition: A | Discharge status: Home / Self Care | Payer: BC Managed Care – PPO | Source: Ambulatory Visit | Attending: Radiation Oncology | Admitting: Radiation Oncology

## 2021-01-15 DIAGNOSIS — Z510 Encounter for antineoplastic radiation therapy: Secondary | ICD-10-CM | POA: Diagnosis not present

## 2021-01-16 ENCOUNTER — Ambulatory Visit
Admission: RE | Admit: 2021-01-16 | Discharge: 2021-01-16 | Disposition: A | Discharge status: Home / Self Care | Payer: BC Managed Care – PPO | Source: Ambulatory Visit | Attending: Radiation Oncology | Admitting: Radiation Oncology

## 2021-01-16 ENCOUNTER — Ambulatory Visit: Payer: BC Managed Care – PPO

## 2021-01-16 DIAGNOSIS — Z510 Encounter for antineoplastic radiation therapy: Secondary | ICD-10-CM | POA: Diagnosis not present

## 2021-01-19 ENCOUNTER — Ambulatory Visit
Admission: RE | Admit: 2021-01-19 | Discharge: 2021-01-19 | Disposition: A | Discharge status: Home / Self Care | Payer: BC Managed Care – PPO | Source: Ambulatory Visit | Attending: Radiation Oncology | Admitting: Radiation Oncology

## 2021-01-19 ENCOUNTER — Ambulatory Visit: Payer: BC Managed Care – PPO

## 2021-01-19 DIAGNOSIS — Z510 Encounter for antineoplastic radiation therapy: Secondary | ICD-10-CM | POA: Diagnosis not present

## 2021-01-20 ENCOUNTER — Ambulatory Visit: Payer: BC Managed Care – PPO

## 2021-01-20 ENCOUNTER — Ambulatory Visit
Admission: RE | Admit: 2021-01-20 | Discharge: 2021-01-20 | Disposition: A | Discharge status: Home / Self Care | Payer: BC Managed Care – PPO | Source: Ambulatory Visit | Attending: Radiation Oncology | Admitting: Radiation Oncology

## 2021-01-20 DIAGNOSIS — Z510 Encounter for antineoplastic radiation therapy: Secondary | ICD-10-CM | POA: Diagnosis not present

## 2021-01-21 ENCOUNTER — Ambulatory Visit
Admission: RE | Admit: 2021-01-21 | Discharge: 2021-01-21 | Disposition: A | Discharge status: Home / Self Care | Payer: BC Managed Care – PPO | Source: Ambulatory Visit | Attending: Radiation Oncology | Admitting: Radiation Oncology

## 2021-01-21 ENCOUNTER — Ambulatory Visit: Payer: BC Managed Care – PPO

## 2021-01-21 DIAGNOSIS — Z510 Encounter for antineoplastic radiation therapy: Secondary | ICD-10-CM | POA: Diagnosis not present

## 2021-01-22 ENCOUNTER — Ambulatory Visit: Payer: BC Managed Care – PPO

## 2021-01-22 ENCOUNTER — Ambulatory Visit
Admission: RE | Admit: 2021-01-22 | Discharge: 2021-01-22 | Disposition: A | Discharge status: Home / Self Care | Payer: BC Managed Care – PPO | Source: Ambulatory Visit | Attending: Radiation Oncology | Admitting: Radiation Oncology

## 2021-01-22 DIAGNOSIS — Z510 Encounter for antineoplastic radiation therapy: Secondary | ICD-10-CM | POA: Diagnosis not present

## 2021-01-23 ENCOUNTER — Ambulatory Visit
Admission: RE | Admit: 2021-01-23 | Discharge: 2021-01-23 | Disposition: A | Discharge status: Home / Self Care | Payer: BC Managed Care – PPO | Source: Ambulatory Visit | Attending: Radiation Oncology | Admitting: Radiation Oncology

## 2021-01-23 DIAGNOSIS — Z510 Encounter for antineoplastic radiation therapy: Secondary | ICD-10-CM | POA: Diagnosis not present

## 2021-01-26 ENCOUNTER — Inpatient Hospital Stay: Payer: BC Managed Care – PPO

## 2021-01-26 ENCOUNTER — Encounter: Payer: Self-pay | Admitting: Oncology

## 2021-01-26 ENCOUNTER — Inpatient Hospital Stay (HOSPITAL_BASED_OUTPATIENT_CLINIC_OR_DEPARTMENT_OTHER): Payer: BC Managed Care – PPO | Admitting: Oncology

## 2021-01-26 ENCOUNTER — Ambulatory Visit
Admission: RE | Admit: 2021-01-26 | Discharge: 2021-01-26 | Disposition: A | Discharge status: Home / Self Care | Payer: BC Managed Care – PPO | Source: Ambulatory Visit | Attending: Radiation Oncology | Admitting: Radiation Oncology

## 2021-01-26 VITALS — BP 138/85 | HR 97 | Resp 16

## 2021-01-26 VITALS — BP 134/72 | HR 105 | Temp 97.2°F | Resp 18 | Wt 150.0 lb

## 2021-01-26 DIAGNOSIS — C3431 Malignant neoplasm of lower lobe, right bronchus or lung: Secondary | ICD-10-CM

## 2021-01-26 DIAGNOSIS — Z5111 Encounter for antineoplastic chemotherapy: Secondary | ICD-10-CM | POA: Diagnosis not present

## 2021-01-26 DIAGNOSIS — T451X5A Adverse effect of antineoplastic and immunosuppressive drugs, initial encounter: Secondary | ICD-10-CM | POA: Diagnosis not present

## 2021-01-26 DIAGNOSIS — D701 Agranulocytosis secondary to cancer chemotherapy: Secondary | ICD-10-CM | POA: Diagnosis not present

## 2021-01-26 DIAGNOSIS — Z510 Encounter for antineoplastic radiation therapy: Secondary | ICD-10-CM | POA: Diagnosis not present

## 2021-01-26 LAB — CBC WITH DIFFERENTIAL/PLATELET
Abs Immature Granulocytes: 0.01 10*3/uL (ref 0.00–0.07)
Basophils Absolute: 0 10*3/uL (ref 0.0–0.1)
Basophils Relative: 0 %
Eosinophils Absolute: 0 10*3/uL (ref 0.0–0.5)
Eosinophils Relative: 0 %
HCT: 26.2 % — ABNORMAL LOW (ref 36.0–46.0)
Hemoglobin: 9.4 g/dL — ABNORMAL LOW (ref 12.0–15.0)
Immature Granulocytes: 1 %
Lymphocytes Relative: 26 %
Lymphs Abs: 0.6 10*3/uL — ABNORMAL LOW (ref 0.7–4.0)
MCH: 34.1 pg — ABNORMAL HIGH (ref 26.0–34.0)
MCHC: 35.9 g/dL (ref 30.0–36.0)
MCV: 94.9 fL (ref 80.0–100.0)
Monocytes Absolute: 0.2 10*3/uL (ref 0.1–1.0)
Monocytes Relative: 11 %
Neutro Abs: 1.4 10*3/uL — ABNORMAL LOW (ref 1.7–7.7)
Neutrophils Relative %: 62 %
Platelets: 205 10*3/uL (ref 150–400)
RBC: 2.76 MIL/uL — ABNORMAL LOW (ref 3.87–5.11)
RDW: 13.7 % (ref 11.5–15.5)
WBC: 2.2 10*3/uL — ABNORMAL LOW (ref 4.0–10.5)
nRBC: 0 % (ref 0.0–0.2)

## 2021-01-26 LAB — COMPREHENSIVE METABOLIC PANEL
ALT: 14 U/L (ref 0–44)
AST: 26 U/L (ref 15–41)
Albumin: 3.6 g/dL (ref 3.5–5.0)
Alkaline Phosphatase: 48 U/L (ref 38–126)
Anion gap: 10 (ref 5–15)
BUN: 11 mg/dL (ref 8–23)
CO2: 21 mmol/L — ABNORMAL LOW (ref 22–32)
Calcium: 8.9 mg/dL (ref 8.9–10.3)
Chloride: 98 mmol/L (ref 98–111)
Creatinine, Ser: 0.79 mg/dL (ref 0.44–1.00)
GFR, Estimated: >60 mL/min (ref >60)
Glucose, Bld: 166 mg/dL — ABNORMAL HIGH (ref 70–99)
Potassium: 3.7 mmol/L (ref 3.5–5.1)
Sodium: 129 mmol/L — ABNORMAL LOW (ref 135–145)
Total Bilirubin: 0.7 mg/dL (ref 0.3–1.2)
Total Protein: 7 g/dL (ref 6.5–8.1)

## 2021-01-26 MED ORDER — HEPARIN SOD (PORK) LOCK FLUSH 100 UNIT/ML IV SOLN
500.0000 [IU] | Freq: Once | INTRAVENOUS | Status: AC
  Administered 2021-01-26: 500 [IU] via INTRAVENOUS
  Filled 2021-01-26: qty 5

## 2021-01-26 MED ORDER — SODIUM CHLORIDE 0.9% FLUSH
10.0000 mL | INTRAVENOUS | Status: AC | PRN
  Administered 2021-01-26: 10 mL via INTRAVENOUS
  Filled 2021-01-26: qty 10

## 2021-01-26 MED ORDER — SODIUM CHLORIDE 0.9 % IV SOLN
45.0000 mg/m2 | Freq: Once | INTRAVENOUS | Status: AC
  Administered 2021-01-26: 84 mg via INTRAVENOUS
  Filled 2021-01-26: qty 14

## 2021-01-26 MED ORDER — SODIUM CHLORIDE 0.9 % IV SOLN
214.8000 mg | Freq: Once | INTRAVENOUS | Status: AC
  Administered 2021-01-26: 210 mg via INTRAVENOUS
  Filled 2021-01-26: qty 21

## 2021-01-26 MED ORDER — SODIUM CHLORIDE 0.9 % IV SOLN
Freq: Once | INTRAVENOUS | Status: AC
  Filled 2021-01-26: qty 250

## 2021-01-26 MED ORDER — SODIUM CHLORIDE 0.9 % IV SOLN
20.0000 mg | Freq: Once | INTRAVENOUS | Status: AC
  Administered 2021-01-26: 20 mg via INTRAVENOUS
  Filled 2021-01-26: qty 20

## 2021-01-26 MED ORDER — FAMOTIDINE 20 MG IN NS 100 ML IVPB
20.0000 mg | Freq: Once | INTRAVENOUS | Status: AC
  Administered 2021-01-26: 20 mg via INTRAVENOUS
  Filled 2021-01-26: qty 20

## 2021-01-26 MED ORDER — DIPHENHYDRAMINE HCL 50 MG/ML IJ SOLN
50.0000 mg | Freq: Once | INTRAMUSCULAR | Status: AC
  Administered 2021-01-26: 50 mg via INTRAVENOUS
  Filled 2021-01-26: qty 1

## 2021-01-26 MED ORDER — PALONOSETRON HCL INJECTION 0.25 MG/5ML
0.2500 mg | Freq: Once | INTRAVENOUS | Status: AC
  Administered 2021-01-26: 0.25 mg via INTRAVENOUS
  Filled 2021-01-26: qty 5

## 2021-01-26 NOTE — Patient Instructions (Signed)
[facility]  Discharge Instructions: Thank you for choosing [facility] to provide your oncology and hematology care.  If you have a lab appointment with the [facility], please go directly to the [facility] and check in at the registration area.  Wear comfortable clothing and clothing appropriate for easy access to any Portacath or PICC line.   We strive to give you quality time with your provider. You may need to reschedule your appointment if you arrive late (15 or more minutes).  Arriving late affects you and other patients whose appointments are after yours.  Also, if you miss three or more appointments without notifying the office, you may be dismissed from the clinic at the provider's discretion.      For prescription refill requests, have your pharmacy contact our office and allow 72 hours for refills to be completed.    Today you received the following chemotherapy and/or immunotherapy agents: Taxol, Carboplatin      To help prevent nausea and vomiting after your treatment, we encourage you to take your nausea medication as directed.  BELOW ARE SYMPTOMS THAT SHOULD BE REPORTED IMMEDIATELY: *FEVER GREATER THAN 100.4 F (38 C) OR HIGHER *CHILLS OR SWEATING *NAUSEA AND VOMITING THAT IS NOT CONTROLLED WITH YOUR NAUSEA MEDICATION *UNUSUAL SHORTNESS OF BREATH *UNUSUAL BRUISING OR BLEEDING *URINARY PROBLEMS (pain or burning when urinating, or frequent urination) *BOWEL PROBLEMS (unusual diarrhea, constipation, pain near the anus) TENDERNESS IN MOUTH AND THROAT WITH OR WITHOUT PRESENCE OF ULCERS (sore throat, sores in mouth, or a toothache) UNUSUAL RASH, SWELLING OR PAIN  UNUSUAL VAGINAL DISCHARGE OR ITCHING   Items with * indicate a potential emergency and should be followed up as soon as possible or go to the Emergency Department if any problems should occur.  Please show the CHEMOTHERAPY ALERT CARD or IMMUNOTHERAPY ALERT CARD at  check-in to the Emergency Department and triage nurse.  Should you have questions after your visit or need to cancel or reschedule your appointment, please contact [facility]  [phone] and follow the prompts.  Office hours are 8:00 a.m. to 4:30 p.m. Monday - Friday. Please note that voicemails left after 4:00 p.m. may not be returned until the following business day.  We are closed weekends and major holidays. You have access to a nurse at all times for urgent questions. Please call the main number to the clinic [phone] and follow the prompts.  For any non-urgent questions, you may also contact your provider using MyChart. We now offer e-Visits for anyone 44 and older to request care online for non-urgent symptoms. For details visit [email or web address].   Also download the MyChart app! Go to the app store, search "MyChart", open the app, select [facility], and log in with your MyChart username and password.  Due to Covid, a mask is required upon entering the hospital/clinic. If you do not have a mask, one will be given to you upon arrival. For doctor visits, patients may have 1 support person aged 66 or older with them. For treatment visits, patients cannot have anyone with them due to current Covid guidelines and our immunocompromised population.

## 2021-01-26 NOTE — Progress Notes (Signed)
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  She quit smoking about 26 years ago.  Other medical problems include hypertension and hyponatremia.She has been treated for iron of possible pneumonia for the last 1 year.  She has a history of intermittent asthma for which she uses Advair.  She was seen by Dr. And underwent CT chest which showed dense infiltrate/solid mass in the right lower lobe with contiguous airspace opacity in the right upper lobe.  Findings could be secondary to pneumonia but concerning for bronchogenic carcinoma.  Patient then had a PET CT scan which showed consolidation in the medial aspect of the right lower lobe measuring 3.4 x 4 cm with an SUV of 11.6.  Hypermetabolic masslike thickening in the right hilum measuring 2.2 cm with intense hypermetabolic activity.  Groundglass airspace consolidation in the posterior aspect of the right upper lobe with an SUV of 11.4.  The right upper lobe opacity was nonspecific and differentials include pulmonary infection versus lymphangitic spread of carcinoma.  CT chest showed showed masslike area of distortion involving right lower lobe middle lobe as well as right upper lobe.  Generalized right hilar masslike appearance concerning for nodal involvement.   Patient underwent bronchoscopy and biopsies.Right lower lobe was concerning for adenocarcinoma.  Lymph node  station 11 R, 4R, 7 were negative for malignancy.  No malignant cells identified in the right upper lobe.  Case was discussed at tumor board and given the hypermetabolism in the right upper lobe as well as hilum involvement cannot be ruled out despite negative biopsies.  We are therefore proceeding with her having stage III T3N1 disease with concurrent chemoradiation with weekly CarboTaxol    Interval history-she is tolerating chemoradiation well so far.  Cough is well controlled.  Does report ongoing fatigue  ECOG PS- 1 Pain scale- 0   Review of systems- Review of Systems  Constitutional:  Positive for malaise/fatigue. Negative for chills, fever and weight loss.  HENT:  Negative for congestion, ear discharge and nosebleeds.   Eyes:  Negative for blurred vision.  Respiratory:  Negative for cough, hemoptysis, sputum production, shortness of breath and wheezing.   Cardiovascular:  Negative for chest pain, palpitations, orthopnea and claudication.  Gastrointestinal:  Negative for abdominal pain, blood in stool, constipation, diarrhea, heartburn, melena, nausea and vomiting.  Genitourinary:  Negative for dysuria, flank pain, frequency, hematuria and urgency.  Musculoskeletal:  Negative for back pain, joint pain and myalgias.  Skin:  Negative for rash.  Neurological:  Negative for dizziness, tingling, focal weakness, seizures, weakness and headaches.  Endo/Heme/Allergies:  Does not bruise/bleed easily.  Psychiatric/Behavioral:  Negative for depression and suicidal ideas. The patient does not have insomnia.      No Known Allergies   Past Medical History:  Diagnosis Date   Anxiety    Asthma    Cough    Dyspnea    with exertion   GERD (gastroesophageal reflux disease)    Hypertension  Past Surgical History:  Procedure Laterality Date   BUNIONECTOMY Bilateral    COLONOSCOPY     PORTA CATH INSERTION N/A 12/22/2020   Procedure: PORTA CATH INSERTION;  Surgeon: Algernon Huxley, MD;   Location: [facility];  Service: Cardiovascular;  Laterality: N/A;   VIDEO BRONCHOSCOPY WITH ENDOBRONCHIAL NAVIGATION N/A 11/07/2020   Procedure: VIDEO BRONCHOSCOPY WITH ENDOBRONCHIAL NAVIGATION;  Surgeon: Ottie Glazier, MD;  Location: [facility];  Service: Thoracic;  Laterality: N/A;   VIDEO BRONCHOSCOPY WITH ENDOBRONCHIAL ULTRASOUND N/A 11/07/2020   Procedure: VIDEO BRONCHOSCOPY WITH ENDOBRONCHIAL ULTRASOUND;  Surgeon: Ottie Glazier, MD;  Location: [facility];  Service: Thoracic;  Laterality: N/A;    Social History   Socioeconomic History   Marital status: Married    Spouse name: Not on file   Number of children: Not on file   Years of education: Not on file   Highest education level: Not on file  Occupational History   Not on file  Tobacco Use   Smoking status: Former    Packs/day: 1.00    Years: 15.00    Pack years: 15.00    Types: Cigarettes    Quit date: 11/07/1994    Years since quitting: 26.2   Smokeless tobacco: Never  Vaping Use   Vaping Use: Never used  Substance and Sexual Activity   Alcohol use: Yes    Comment:  wine daily   Drug use: Never   Sexual activity: Not on file  Other Topics Concern   Not on file  Social History Narrative   Not on file   Social Determinants of Health   Financial Resource Strain: Not on file  Food Insecurity: Not on file  Transportation Needs: Not on file  Physical Activity: Not on file  Stress: Not on file  Social Connections: Not on file  Intimate Partner Violence: Not on file    No family history on file.   Current Outpatient Medications:    ADVAIR DISKUS 250-50 MCG/DOSE AEPB, Inhale 1 puff into the lungs as needed., Disp: , Rfl:    ALPRAZolam (XANAX) 0.5 MG tablet, Take 0.5 mg by mouth at bedtime as needed for sleep., Disp: , Rfl:    amLODipine (NORVASC) 5 MG tablet, Take 5 mg by mouth at bedtime., Disp: , Rfl:    chlorpheniramine-HYDROcodone (TUSSIONEX) 10-8 MG/5ML SUER, Take 5 mLs by mouth every 12 (twelve) hours  as needed for cough., Disp: 140 mL, Rfl: 0   Cholecalciferol 50 MCG (2000 UT) CAPS, Take 2,000 Units by mouth daily., Disp: , Rfl:    dexamethasone (DECADRON) 4 MG tablet, Take 2 tablets (8 mg total) by mouth daily. Start the day after chemotherapy for 2 days., Disp: 30 tablet, Rfl: 1   esomeprazole (NEXIUM) 20 MG capsule, Take 20 mg by mouth as needed., Disp: , Rfl:    fluticasone (FLONASE) 50 MCG/ACT nasal spray, Place 2 sprays into both nostrils daily as needed for rhinitis., Disp: , Rfl:    lansoprazole (PREVACID) 30 MG capsule, Take 1 capsule (30 mg total) by mouth daily at 12 noon., Disp: 30 capsule, Rfl: 2   lidocaine-prilocaine (EMLA) cream, Apply to affected area once, Disp: 30 g, Rfl: 3   LORazepam (ATIVAN) 0.5 MG tablet, Take 1 tablet (0.5 mg total) by mouth every 6 (six) hours as needed (Nausea or vomiting)., Disp: 30 tablet, Rfl: 0   metoprolol succinate (TOPROL-XL) 50 MG 24 hr tablet, Take 50 mg by mouth every morning., Disp: , Rfl:    olmesartan (BENICAR)  40 MG tablet, Take 40 mg by mouth at bedtime., Disp: , Rfl:    ondansetron (ZOFRAN) 8 MG tablet, Take 1 tablet (8 mg total) by mouth 2 (two) times daily as needed for refractory nausea / vomiting. Start on day 3 after chemo., Disp: 30 tablet, Rfl: 1   prochlorperazine (COMPAZINE) 10 MG tablet, Take 1 tablet (10 mg total) by mouth every 6 (six) hours as needed (Nausea or vomiting)., Disp: 30 tablet, Rfl: 1   zolpidem (AMBIEN) 10 MG tablet, Take 10 mg by mouth at bedtime as needed for sleep., Disp: , Rfl:  No current facility-administered medications for this visit.  Facility-Administered Medications Ordered in Other Visits:    heparin lock flush 100 unit/mL, 500 Units, Intravenous, Once, Sindy Guadeloupe, MD   sodium chloride flush (NS) 0.9 % injection 10 mL, 10 mL, Intravenous, Once, Sindy Guadeloupe, MD   sodium chloride flush (NS) 0.9 % injection 10 mL, 10 mL, Intravenous, PRN, Sindy Guadeloupe, MD, 10 mL at 01/26/21 0807  Physical  exam:  Vitals:   01/26/21 0839  BP: 134/72  Pulse: (!) 105  Resp: 18  Temp: (!) 97.2 F (36.2 C)  TempSrc: Tympanic  SpO2: 100%  Weight: 150 lb (68 kg)   Physical Exam Cardiovascular:     Rate and Rhythm: Normal rate and regular rhythm.     Heart sounds: Normal heart sounds.  Pulmonary:     Effort: Pulmonary effort is normal.     Breath sounds: Normal breath sounds.  Abdominal:     General: Bowel sounds are normal.     Palpations: Abdomen is soft.  Skin:    General: Skin is warm and dry.  Neurological:     Mental Status: She is alert and oriented to person, place, and time.     CMP Latest Ref Rng & Units 01/13/2021  Glucose 70 - 99 mg/dL 133(H)  BUN 8 - 23 mg/dL 10  Creatinine 0.44 - 1.00 mg/dL 0.68  Sodium 135 - 145 mmol/L 134(L)  Potassium 3.5 - 5.1 mmol/L 3.8  Chloride 98 - 111 mmol/L 100  CO2 22 - 32 mmol/L 23  Calcium 8.9 - 10.3 mg/dL 9.2  Total Protein 6.5 - 8.1 g/dL 7.3  Total Bilirubin 0.3 - 1.2 mg/dL 0.6  Alkaline Phos 38 - 126 U/L 47  AST 15 - 41 U/L 21  ALT 0 - 44 U/L 16   CBC Latest Ref Rng & Units 01/13/2021  WBC 4.0 - 10.5 K/uL 3.1(L)  Hemoglobin 12.0 - 15.0 g/dL 11.6(L)  Hematocrit 36.0 - 46.0 % 33.4(L)  Platelets 150 - 400 K/uL 174     Assessment and plan- Patient is a 65 y.o. female with adenocarcinoma of the right lung clinical stage III acT3 N1 M0.  She is here for on treatment assessment prior to cycle 6 of weekly CarboTaxol chemotherapy concurrent with radiation  Counts okay to proceed with cycle 6 of weekly CarboTaxol chemotherapy today.  She will directly proceed for cycle 7 of chemotherapy next week.  She finishes radiation on 02/04/2021  Will repeat CT chest abdomen pelvis with contrast in 2 weeks and I will see her thereafter to discuss the results of CT scan and talk about maintenance durvalumab   Visit Diagnosis 1. Encounter for antineoplastic chemotherapy   2. Malignant neoplasm of lower lobe of right lung ([place])      Dr. Randa Evens, MD, MPH [facility] [phone] 01/26/2021 12:31 PM

## 2021-01-26 NOTE — Progress Notes (Signed)
Nutrition Follow-up:   Patient with stage III lung cancer.  Patient on concurrent chemotherapy and radiation therapy.    Met with patient during infusion.  Patient reports that she is eating but having issues with heartburn. Taking medication.  Says that typically eats toast for breakfast.  Lunch is usually leftovers or sandwich with chips. Dinner is meat and couple vegetables.  Has not tried any oral nutrition supplements    Medications: reviewed, glucose 166  Labs: reviewed  Anthropometrics:   Weight 150 lb today decreased  157 lb on 5/24 159 lb on 5/17  6% weight loss in the last month and half, concerning  NUTRITION DIAGNOSIS: Inadequate oral intake related to heartburn/radiation esophagitis as evidenced by 6% weight loss in the last month and half   INTERVENTION:  Recommend adding oral nutrition supplement 1-2 times per day between meals.  Samples of ensure complete, boost plus, [employer] shake and orgain shake given today.  Patient planning to add at breakfast     MONITORING, EVALUATION, GOAL: weight trends, intake   NEXT VISIT: to be determined with treatment  Katherine Perry, [place], [place] Registered Dietitian [phone] (mobile)

## 2021-01-26 NOTE — Progress Notes (Signed)
Per Dr. Janese Banks, Friona to treat with ANC 1.4 and elevated HR 105.

## 2021-01-26 NOTE — Progress Notes (Signed)
Patient here for oncology follow-up appointment, expresses concerns of left knee pain and cough

## 2021-01-27 ENCOUNTER — Ambulatory Visit
Admission: RE | Admit: 2021-01-27 | Discharge: 2021-01-27 | Disposition: A | Discharge status: Home / Self Care | Payer: BC Managed Care – PPO | Source: Ambulatory Visit | Attending: Radiation Oncology | Admitting: Radiation Oncology

## 2021-01-27 ENCOUNTER — Inpatient Hospital Stay: Payer: BC Managed Care – PPO

## 2021-01-27 DIAGNOSIS — D701 Agranulocytosis secondary to cancer chemotherapy: Secondary | ICD-10-CM

## 2021-01-27 DIAGNOSIS — Z510 Encounter for antineoplastic radiation therapy: Secondary | ICD-10-CM | POA: Diagnosis not present

## 2021-01-27 DIAGNOSIS — Z5111 Encounter for antineoplastic chemotherapy: Secondary | ICD-10-CM | POA: Diagnosis not present

## 2021-01-27 MED ORDER — FILGRASTIM-SNDZ 480 MCG/0.8ML IJ SOSY
480.0000 ug | PREFILLED_SYRINGE | Freq: Every day | INTRAMUSCULAR | Status: DC
  Administered 2021-01-27: 480 ug via SUBCUTANEOUS
  Filled 2021-01-27: qty 0.8

## 2021-01-28 ENCOUNTER — Ambulatory Visit
Admission: RE | Admit: 2021-01-28 | Discharge: 2021-01-28 | Disposition: A | Discharge status: Home / Self Care | Payer: BC Managed Care – PPO | Source: Ambulatory Visit | Attending: Radiation Oncology | Admitting: Radiation Oncology

## 2021-01-28 ENCOUNTER — Inpatient Hospital Stay: Payer: BC Managed Care – PPO

## 2021-01-28 DIAGNOSIS — Z510 Encounter for antineoplastic radiation therapy: Secondary | ICD-10-CM | POA: Diagnosis not present

## 2021-01-28 DIAGNOSIS — D701 Agranulocytosis secondary to cancer chemotherapy: Secondary | ICD-10-CM

## 2021-01-28 DIAGNOSIS — Z5111 Encounter for antineoplastic chemotherapy: Secondary | ICD-10-CM | POA: Diagnosis not present

## 2021-01-28 MED ORDER — FILGRASTIM-SNDZ 480 MCG/0.8ML IJ SOSY
480.0000 ug | PREFILLED_SYRINGE | Freq: Every day | INTRAMUSCULAR | Status: DC
  Administered 2021-01-28: 480 ug via SUBCUTANEOUS
  Filled 2021-01-28: qty 0.8

## 2021-01-29 ENCOUNTER — Ambulatory Visit
Admission: RE | Admit: 2021-01-29 | Discharge: 2021-01-29 | Disposition: A | Discharge status: Home / Self Care | Payer: BC Managed Care – PPO | Source: Ambulatory Visit | Attending: Radiation Oncology | Admitting: Radiation Oncology

## 2021-01-29 ENCOUNTER — Telehealth: Payer: Self-pay | Admitting: *Deleted

## 2021-01-29 DIAGNOSIS — Z510 Encounter for antineoplastic radiation therapy: Secondary | ICD-10-CM | POA: Diagnosis not present

## 2021-01-29 NOTE — Telephone Encounter (Signed)
Patient called reporting that she is having right knee pain since starting her chemotherapy and is asking if it is related and would like a return call to discuss what can be done for it

## 2021-01-29 NOTE — Telephone Encounter (Signed)
I called and spoke with patient. She reports knees ache following chemotherapy. No other pain, fever, chills, or other symptoms. No trauma. Likely ostealgias are from Neupogen. Recommended Claritin 10mg  daily for several days. Also okay to take acetaminophen or NSAID. She does not think she needs anything stronger for pain. Patient instructed to call if symptoms worsen or do not improve.

## 2021-01-30 ENCOUNTER — Ambulatory Visit
Admission: RE | Admit: 2021-01-30 | Discharge: 2021-01-30 | Disposition: A | Discharge status: Home / Self Care | Payer: BC Managed Care – PPO | Source: Ambulatory Visit | Attending: Radiation Oncology | Admitting: Radiation Oncology

## 2021-01-30 DIAGNOSIS — Z510 Encounter for antineoplastic radiation therapy: Secondary | ICD-10-CM | POA: Diagnosis not present

## 2021-01-30 DIAGNOSIS — C3431 Malignant neoplasm of lower lobe, right bronchus or lung: Secondary | ICD-10-CM | POA: Diagnosis present

## 2021-02-03 ENCOUNTER — Ambulatory Visit
Admission: RE | Admit: 2021-02-03 | Discharge: 2021-02-03 | Disposition: A | Discharge status: Home / Self Care | Payer: BC Managed Care – PPO | Source: Ambulatory Visit | Attending: Radiation Oncology | Admitting: Radiation Oncology

## 2021-02-03 DIAGNOSIS — Z510 Encounter for antineoplastic radiation therapy: Secondary | ICD-10-CM | POA: Diagnosis not present

## 2021-02-04 ENCOUNTER — Ambulatory Visit: Payer: BC Managed Care – PPO

## 2021-02-04 ENCOUNTER — Inpatient Hospital Stay: Payer: BC Managed Care – PPO

## 2021-02-04 ENCOUNTER — Inpatient Hospital Stay: Payer: BC Managed Care – PPO | Attending: Oncology

## 2021-02-04 ENCOUNTER — Ambulatory Visit
Admission: RE | Admit: 2021-02-04 | Discharge: 2021-02-04 | Disposition: A | Discharge status: Home / Self Care | Payer: BC Managed Care – PPO | Source: Ambulatory Visit | Attending: Radiation Oncology | Admitting: Radiation Oncology

## 2021-02-04 ENCOUNTER — Encounter: Payer: Self-pay | Admitting: *Deleted

## 2021-02-04 VITALS — BP 144/83 | HR 92 | Temp 96.5°F | Resp 20

## 2021-02-04 DIAGNOSIS — Z5111 Encounter for antineoplastic chemotherapy: Secondary | ICD-10-CM | POA: Insufficient documentation

## 2021-02-04 DIAGNOSIS — C3431 Malignant neoplasm of lower lobe, right bronchus or lung: Secondary | ICD-10-CM | POA: Diagnosis not present

## 2021-02-04 DIAGNOSIS — Z5112 Encounter for antineoplastic immunotherapy: Secondary | ICD-10-CM | POA: Diagnosis present

## 2021-02-04 DIAGNOSIS — Z79899 Other long term (current) drug therapy: Secondary | ICD-10-CM | POA: Diagnosis not present

## 2021-02-04 DIAGNOSIS — Z510 Encounter for antineoplastic radiation therapy: Secondary | ICD-10-CM | POA: Diagnosis not present

## 2021-02-04 DIAGNOSIS — Z87891 Personal history of nicotine dependence: Secondary | ICD-10-CM | POA: Insufficient documentation

## 2021-02-04 LAB — COMPREHENSIVE METABOLIC PANEL
ALT: 15 U/L (ref 0–44)
AST: 25 U/L (ref 15–41)
Albumin: 3.5 g/dL (ref 3.5–5.0)
Alkaline Phosphatase: 50 U/L (ref 38–126)
Anion gap: 9 (ref 5–15)
BUN: 10 mg/dL (ref 8–23)
CO2: 24 mmol/L (ref 22–32)
Calcium: 8.9 mg/dL (ref 8.9–10.3)
Chloride: 101 mmol/L (ref 98–111)
Creatinine, Ser: 0.71 mg/dL (ref 0.44–1.00)
GFR, Estimated: >60 mL/min (ref >60)
Glucose, Bld: 125 mg/dL — ABNORMAL HIGH (ref 70–99)
Potassium: 4 mmol/L (ref 3.5–5.1)
Sodium: 134 mmol/L — ABNORMAL LOW (ref 135–145)
Total Bilirubin: 0.8 mg/dL (ref 0.3–1.2)
Total Protein: 7.2 g/dL (ref 6.5–8.1)

## 2021-02-04 LAB — CBC WITH DIFFERENTIAL/PLATELET
Abs Immature Granulocytes: 0.15 10*3/uL — ABNORMAL HIGH (ref 0.00–0.07)
Basophils Absolute: 0 10*3/uL (ref 0.0–0.1)
Basophils Relative: 1 %
Eosinophils Absolute: 0 10*3/uL (ref 0.0–0.5)
Eosinophils Relative: 0 %
HCT: 26.8 % — ABNORMAL LOW (ref 36.0–46.0)
Hemoglobin: 9 g/dL — ABNORMAL LOW (ref 12.0–15.0)
Immature Granulocytes: 4 %
Lymphocytes Relative: 15 %
Lymphs Abs: 0.5 10*3/uL — ABNORMAL LOW (ref 0.7–4.0)
MCH: 33.1 pg (ref 26.0–34.0)
MCHC: 33.6 g/dL (ref 30.0–36.0)
MCV: 98.5 fL (ref 80.0–100.0)
Monocytes Absolute: 0.5 10*3/uL (ref 0.1–1.0)
Monocytes Relative: 14 %
Neutro Abs: 2.3 10*3/uL (ref 1.7–7.7)
Neutrophils Relative %: 66 %
Platelets: 215 10*3/uL (ref 150–400)
RBC: 2.72 MIL/uL — ABNORMAL LOW (ref 3.87–5.11)
RDW: 15.8 % — ABNORMAL HIGH (ref 11.5–15.5)
WBC: 3.5 10*3/uL — ABNORMAL LOW (ref 4.0–10.5)
nRBC: 0 % (ref 0.0–0.2)

## 2021-02-04 MED ORDER — SODIUM CHLORIDE 0.9 % IV SOLN
20.0000 mg | Freq: Once | INTRAVENOUS | Status: AC
  Administered 2021-02-04: 20 mg via INTRAVENOUS
  Filled 2021-02-04: qty 20

## 2021-02-04 MED ORDER — SODIUM CHLORIDE 0.9 % IV SOLN
Freq: Once | INTRAVENOUS | Status: AC
  Filled 2021-02-04: qty 250

## 2021-02-04 MED ORDER — SODIUM CHLORIDE 0.9 % IV SOLN
45.0000 mg/m2 | Freq: Once | INTRAVENOUS | Status: AC
  Administered 2021-02-04: 84 mg via INTRAVENOUS
  Filled 2021-02-04: qty 14

## 2021-02-04 MED ORDER — HEPARIN SOD (PORK) LOCK FLUSH 100 UNIT/ML IV SOLN
INTRAVENOUS | Status: AC
  Filled 2021-02-04: qty 5

## 2021-02-04 MED ORDER — DIPHENHYDRAMINE HCL 50 MG/ML IJ SOLN
50.0000 mg | Freq: Once | INTRAMUSCULAR | Status: AC
  Administered 2021-02-04: 50 mg via INTRAVENOUS
  Filled 2021-02-04: qty 1

## 2021-02-04 MED ORDER — FAMOTIDINE 20 MG IN NS 100 ML IVPB
20.0000 mg | Freq: Once | INTRAVENOUS | Status: AC
  Administered 2021-02-04: 20 mg via INTRAVENOUS
  Filled 2021-02-04: qty 20

## 2021-02-04 MED ORDER — SODIUM CHLORIDE 0.9 % IV SOLN
214.8000 mg | Freq: Once | INTRAVENOUS | Status: AC
  Administered 2021-02-04: 210 mg via INTRAVENOUS
  Filled 2021-02-04: qty 21

## 2021-02-04 MED ORDER — SODIUM CHLORIDE 0.9% FLUSH
10.0000 mL | INTRAVENOUS | Status: DC | PRN
  Administered 2021-02-04: 10 mL via INTRAVENOUS
  Filled 2021-02-04: qty 10

## 2021-02-04 MED ORDER — PALONOSETRON HCL INJECTION 0.25 MG/5ML
0.2500 mg | Freq: Once | INTRAVENOUS | Status: AC
  Administered 2021-02-04: 0.25 mg via INTRAVENOUS
  Filled 2021-02-04: qty 5

## 2021-02-04 MED ORDER — HEPARIN SOD (PORK) LOCK FLUSH 100 UNIT/ML IV SOLN
500.0000 [IU] | Freq: Once | INTRAVENOUS | Status: AC
  Administered 2021-02-04: 500 [IU] via INTRAVENOUS
  Filled 2021-02-04: qty 5

## 2021-02-04 NOTE — Patient Instructions (Signed)
[facility]   Discharge Instructions: Thank you for choosing [facility] to provide your oncology and hematology care.  If you have a lab appointment with the [facility], please go directly to the [facility] and check in at the registration area.  Wear comfortable clothing and clothing appropriate for easy access to any Portacath or PICC line.   We strive to give you quality time with your provider. You may need to reschedule your appointment if you arrive late (15 or more minutes).  Arriving late affects you and other patients whose appointments are after yours.  Also, if you miss three or more appointments without notifying the office, you may be dismissed from the clinic at the provider's discretion.      For prescription refill requests, have your pharmacy contact our office and allow 72 hours for refills to be completed.    Today you received the following chemotherapy and/or immunotherapy agents: Taxol, Carboplatin.      To help prevent nausea and vomiting after your treatment, we encourage you to take your nausea medication as directed.  BELOW ARE SYMPTOMS THAT SHOULD BE REPORTED IMMEDIATELY: *FEVER GREATER THAN 100.4 F (38 C) OR HIGHER *CHILLS OR SWEATING *NAUSEA AND VOMITING THAT IS NOT CONTROLLED WITH YOUR NAUSEA MEDICATION *UNUSUAL SHORTNESS OF BREATH *UNUSUAL BRUISING OR BLEEDING *URINARY PROBLEMS (pain or burning when urinating, or frequent urination) *BOWEL PROBLEMS (unusual diarrhea, constipation, pain near the anus) TENDERNESS IN MOUTH AND THROAT WITH OR WITHOUT PRESENCE OF ULCERS (sore throat, sores in mouth, or a toothache) UNUSUAL RASH, SWELLING OR PAIN  UNUSUAL VAGINAL DISCHARGE OR ITCHING   Items with * indicate a potential emergency and should be followed up as soon as possible or go to the Emergency Department if any problems should occur.  Please show the CHEMOTHERAPY ALERT CARD or IMMUNOTHERAPY ALERT CARD  at check-in to the Emergency Department and triage nurse.  Should you have questions after your visit or need to cancel or reschedule your appointment, please contact [facility]  [phone] and follow the prompts.  Office hours are 8:00 a.m. to 4:30 p.m. Monday - Friday. Please note that voicemails left after 4:00 p.m. may not be returned until the following business day.  We are closed weekends and major holidays. You have access to a nurse at all times for urgent questions. Please call the main number to the clinic [phone] and follow the prompts.  For any non-urgent questions, you may also contact your provider using MyChart. We now offer e-Visits for anyone 39 and older to request care online for non-urgent symptoms. For details visit [email or web address].   Also download the MyChart app! Go to the app store, search "MyChart", open the app, select [facility], and log in with your MyChart username and password.  Due to Covid, a mask is required upon entering the hospital/clinic. If you do not have a mask, one will be given to you upon arrival. For doctor visits, patients may have 1 support person aged 24 or older with them. For treatment visits, patients cannot have anyone with them due to current Covid guidelines and our immunocompromised population.

## 2021-02-09 ENCOUNTER — Ambulatory Visit: Payer: BC Managed Care – PPO

## 2021-02-09 ENCOUNTER — Telehealth: Payer: Self-pay | Admitting: *Deleted

## 2021-02-09 ENCOUNTER — Other Ambulatory Visit: Payer: Self-pay | Admitting: *Deleted

## 2021-02-09 MED ORDER — TRAMADOL HCL 50 MG PO TABS: 50.0000 mg | ORAL_TABLET | Freq: Four times a day (QID) | ORAL | 0 refills | Status: DC | PRN

## 2021-02-09 NOTE — Telephone Encounter (Signed)
Pt made aware of MD recommendations. Rx sent to provider for approval.

## 2021-02-09 NOTE — Telephone Encounter (Signed)
Pt left message stating is having ongoing left knee pain. Takes aleve which helps but pain persists later in the day. Pt is going on vacation next week and would like to know if there is anything she can take to help relieve her knee pain before going on vacation.   Please advise.

## 2021-02-09 NOTE — Telephone Encounter (Signed)
We can send her 1 time prescription for tramadol 50 mg q6 prn 30 tab. But she will need to discuss this further with her pcp for future management

## 2021-02-11 ENCOUNTER — Ambulatory Visit
Admission: RE | Admit: 2021-02-11 | Discharge: 2021-02-11 | Disposition: A | Discharge status: Home / Self Care | Payer: BC Managed Care – PPO | Source: Ambulatory Visit | Attending: Oncology | Admitting: Oncology

## 2021-02-11 ENCOUNTER — Other Ambulatory Visit: Payer: Self-pay

## 2021-02-11 DIAGNOSIS — C3431 Malignant neoplasm of lower lobe, right bronchus or lung: Secondary | ICD-10-CM | POA: Diagnosis present

## 2021-02-11 MED ORDER — IOHEXOL 300 MG/ML  SOLN
100.0000 mL | Freq: Once | INTRAMUSCULAR | Status: AC | PRN
  Administered 2021-02-11: 100 mL via INTRAVENOUS

## 2021-02-13 ENCOUNTER — Encounter: Payer: Self-pay | Admitting: Oncology

## 2021-02-13 ENCOUNTER — Inpatient Hospital Stay (HOSPITAL_BASED_OUTPATIENT_CLINIC_OR_DEPARTMENT_OTHER): Payer: BC Managed Care – PPO | Admitting: Oncology

## 2021-02-13 ENCOUNTER — Inpatient Hospital Stay: Payer: BC Managed Care – PPO

## 2021-02-13 VITALS — BP 118/67 | HR 101 | Temp 97.2°F | Resp 17 | Wt 149.5 lb

## 2021-02-13 DIAGNOSIS — C3431 Malignant neoplasm of lower lobe, right bronchus or lung: Secondary | ICD-10-CM | POA: Diagnosis not present

## 2021-02-13 DIAGNOSIS — Z5112 Encounter for antineoplastic immunotherapy: Secondary | ICD-10-CM

## 2021-02-13 LAB — CBC WITH DIFFERENTIAL/PLATELET
Abs Immature Granulocytes: 0.03 10*3/uL (ref 0.00–0.07)
Basophils Absolute: 0 10*3/uL (ref 0.0–0.1)
Basophils Relative: 1 %
Eosinophils Absolute: 0 10*3/uL (ref 0.0–0.5)
Eosinophils Relative: 0 %
HCT: 26.1 % — ABNORMAL LOW (ref 36.0–46.0)
Hemoglobin: 8.7 g/dL — ABNORMAL LOW (ref 12.0–15.0)
Immature Granulocytes: 2 %
Lymphocytes Relative: 16 %
Lymphs Abs: 0.3 10*3/uL — ABNORMAL LOW (ref 0.7–4.0)
MCH: 33.9 pg (ref 26.0–34.0)
MCHC: 33.3 g/dL (ref 30.0–36.0)
MCV: 101.6 fL — ABNORMAL HIGH (ref 80.0–100.0)
Monocytes Absolute: 0.3 10*3/uL (ref 0.1–1.0)
Monocytes Relative: 16 %
Neutro Abs: 1.3 10*3/uL — ABNORMAL LOW (ref 1.7–7.7)
Neutrophils Relative %: 65 %
Platelets: 283 10*3/uL (ref 150–400)
RBC: 2.57 MIL/uL — ABNORMAL LOW (ref 3.87–5.11)
RDW: 18.5 % — ABNORMAL HIGH (ref 11.5–15.5)
WBC: 2 10*3/uL — ABNORMAL LOW (ref 4.0–10.5)
nRBC: 0 % (ref 0.0–0.2)

## 2021-02-13 LAB — COMPREHENSIVE METABOLIC PANEL
ALT: 18 U/L (ref 0–44)
AST: 24 U/L (ref 15–41)
Albumin: 3.7 g/dL (ref 3.5–5.0)
Alkaline Phosphatase: 46 U/L (ref 38–126)
Anion gap: 10 (ref 5–15)
BUN: 9 mg/dL (ref 8–23)
CO2: 25 mmol/L (ref 22–32)
Calcium: 9 mg/dL (ref 8.9–10.3)
Chloride: 98 mmol/L (ref 98–111)
Creatinine, Ser: 0.64 mg/dL (ref 0.44–1.00)
GFR, Estimated: >60 mL/min (ref >60)
Glucose, Bld: 115 mg/dL — ABNORMAL HIGH (ref 70–99)
Potassium: 3.4 mmol/L — ABNORMAL LOW (ref 3.5–5.1)
Sodium: 133 mmol/L — ABNORMAL LOW (ref 135–145)
Total Bilirubin: 0.5 mg/dL (ref 0.3–1.2)
Total Protein: 7.3 g/dL (ref 6.5–8.1)

## 2021-02-13 NOTE — Progress Notes (Signed)
F/U Lung ca; works. Energy is fair. Chronic cough non productive. Denies any dyspnea. No chest pain. Appetite is normal. Has indigestion, she is taking nexium for this. Hx constipation. Takes OTC stool softeners.

## 2021-02-14 ENCOUNTER — Encounter: Payer: Self-pay | Admitting: Oncology

## 2021-02-14 NOTE — Progress Notes (Signed)
ON PATHWAY REGIMEN - Non-Small Cell Lung  No Change  Continue With Treatment as Ordered.  Original Decision Date/Time: 11/23/2020 11:23     Administer weekly:     Paclitaxel      Carboplatin   **Always confirm dose/schedule in your pharmacy ordering system**  Patient Characteristics: Preoperative or Nonsurgical Candidate (Clinical Staging), Stage III - Nonsurgical Candidate (Nonsquamous and Squamous), PS = 0, 1 Therapeutic Status: Preoperative or Nonsurgical Candidate (Clinical Staging) AJCC T Category: cT3 AJCC N Category: cN1 AJCC M Category: cM0 AJCC 8 Stage Grouping: IIIA ECOG Performance Status: 1 Intent of Therapy: Curative Intent, Discussed with Patient

## 2021-02-14 NOTE — Progress Notes (Signed)
DISCONTINUE ON PATHWAY REGIMEN - Non-Small Cell Lung     Administer weekly:     Paclitaxel      Carboplatin   **Always confirm dose/schedule in your pharmacy ordering system**  REASON: Continuation Of Treatment PRIOR TREATMENT: POL410: Carboplatin AUC=2 + Paclitaxel 45 mg/m2 Weekly During Radiation TREATMENT RESPONSE: Partial Response (PR)  START ON PATHWAY REGIMEN - Non-Small Cell Lung     A cycle is every 14 days:     Durvalumab   **Always confirm dose/schedule in your pharmacy ordering system**  Patient Characteristics: Preoperative or Nonsurgical Candidate (Clinical Staging), Stage III - Nonsurgical Candidate (Nonsquamous and Squamous), PS = 0, 1 Therapeutic Status: Preoperative or Nonsurgical Candidate (Clinical Staging) AJCC T Category: cT3 AJCC N Category: cN1 AJCC M Category: cM0 AJCC 8 Stage Grouping: IIIA ECOG Performance Status: 1 Intent of Therapy: Curative Intent, Discussed with Patient

## 2021-02-14 NOTE — Progress Notes (Signed)
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  She quit smoking about 26 years ago.  Other medical problems include hypertension and hyponatremia.She has been treated for iron of possible pneumonia for the last 1 year.  She has a history of intermittent asthma for which she uses Advair.  She was seen by Dr. And underwent CT chest which showed dense infiltrate/solid mass in the right lower lobe with contiguous airspace opacity in the right upper lobe.  Findings could be secondary to pneumonia but concerning for bronchogenic carcinoma.  Patient then had a PET CT scan which showed consolidation in the medial aspect of the right lower lobe measuring 3.4 x 4 cm with an SUV of 11.6.  Hypermetabolic masslike thickening in the right hilum measuring 2.2 cm with intense hypermetabolic activity.  Groundglass airspace consolidation in the posterior aspect of the right upper lobe with an SUV of 11.4.  The right upper lobe opacity was nonspecific and differentials include pulmonary infection versus lymphangitic spread of carcinoma.  CT chest showed showed masslike area of distortion involving right lower lobe middle lobe as well as right upper lobe.  Generalized right hilar masslike appearance concerning for nodal involvement.   Patient underwent bronchoscopy and biopsies.Right lower lobe was concerning for adenocarcinoma.  Lymph node station 11 R, 4R, 7 were  negative for malignancy.  No malignant cells identified in the right upper lobe.  Case was discussed at tumor board and given the hypermetabolism in the right upper lobe as well as hilum involvement cannot be ruled out despite negative biopsies.  Patient completed concurrent chemoradiation with weekly CarboTaxol in July 2022.  Scan showed partial response   Interval history-patient reports ongoing fatigue.  She also has new onset left knee pain which she states started after she received Neupogen shots.  She is currently using tramadol for her knee pain with some benefit.  Continues to have mild nonproductive cough  ECOG PS- 1 Pain scale- 0   Review of systems- Review of Systems  Constitutional:  Positive for malaise/fatigue. Negative for chills, fever and weight loss.  HENT:  Negative for congestion, ear discharge and nosebleeds.   Eyes:  Negative for blurred vision.  Respiratory:  Positive for cough. Negative for hemoptysis, sputum production, shortness of breath and wheezing.   Cardiovascular:  Negative for chest pain, palpitations, orthopnea and claudication.  Gastrointestinal:  Negative for abdominal pain, blood in stool, constipation, diarrhea, heartburn, melena, nausea and vomiting.  Genitourinary:  Negative for dysuria, flank pain, frequency, hematuria and urgency.  Musculoskeletal:  Positive for joint pain. Negative for back pain and myalgias.  Skin:  Negative for rash.  Neurological:  Negative for dizziness, tingling, focal weakness, seizures, weakness and headaches.  Endo/Heme/Allergies:  Does not bruise/bleed easily.  Psychiatric/Behavioral:  Negative for depression and suicidal ideas. The patient does not have insomnia.     No Known Allergies   Past Medical History:  Diagnosis Date   Anxiety    Asthma  Cough    Dyspnea    with exertion   GERD (gastroesophageal reflux disease)    Hypertension      Past Surgical History:  Procedure Laterality Date   BUNIONECTOMY  Bilateral    COLONOSCOPY     PORTA CATH INSERTION N/A 12/22/2020   Procedure: PORTA CATH INSERTION;  Surgeon: Algernon Huxley, MD;  Location: [facility];  Service: Cardiovascular;  Laterality: N/A;   VIDEO BRONCHOSCOPY WITH ENDOBRONCHIAL NAVIGATION N/A 11/07/2020   Procedure: VIDEO BRONCHOSCOPY WITH ENDOBRONCHIAL NAVIGATION;  Surgeon: Ottie Glazier, MD;  Location: [facility];  Service: Thoracic;  Laterality: N/A;   VIDEO BRONCHOSCOPY WITH ENDOBRONCHIAL ULTRASOUND N/A 11/07/2020   Procedure: VIDEO BRONCHOSCOPY WITH ENDOBRONCHIAL ULTRASOUND;  Surgeon: Ottie Glazier, MD;  Location: [facility];  Service: Thoracic;  Laterality: N/A;    Social History   Socioeconomic History   Marital status: Married    Spouse name: Not on file   Number of children: Not on file   Years of education: Not on file   Highest education level: Not on file  Occupational History   Not on file  Tobacco Use   Smoking status: Former    Packs/day: 1.00    Years: 15.00    Pack years: 15.00    Types: Cigarettes    Quit date: 11/07/1994    Years since quitting: 26.2   Smokeless tobacco: Never  Vaping Use   Vaping Use: Never used  Substance and Sexual Activity   Alcohol use: Yes    Comment:  wine daily   Drug use: Never   Sexual activity: Not on file  Other Topics Concern   Not on file  Social History Narrative   Not on file   Social Determinants of Health   Financial Resource Strain: Not on file  Food Insecurity: Not on file  Transportation Needs: Not on file  Physical Activity: Not on file  Stress: Not on file  Social Connections: Not on file  Intimate Partner Violence: Not on file    History reviewed. No pertinent family history.   Current Outpatient Medications:    acetaminophen (TYLENOL) 500 MG tablet, Take 500 mg by mouth every 6 (six) hours as needed., Disp: , Rfl:    ADVAIR DISKUS 250-50 MCG/DOSE AEPB, Inhale 1 puff into the lungs as needed., Disp: , Rfl:    ALPRAZolam (XANAX) 0.5 MG  tablet, Take 0.5 mg by mouth at bedtime as needed for sleep., Disp: , Rfl:    amLODipine (NORVASC) 5 MG tablet, Take 5 mg by mouth at bedtime., Disp: , Rfl:    chlorpheniramine-HYDROcodone (TUSSIONEX) 10-8 MG/5ML SUER, Take 5 mLs by mouth every 12 (twelve) hours as needed for cough., Disp: 140 mL, Rfl: 0   Cholecalciferol 50 MCG (2000 UT) CAPS, Take 2,000 Units by mouth daily., Disp: , Rfl:    docusate sodium (COLACE) 100 MG capsule, Take 100 mg by mouth daily., Disp: , Rfl:    esomeprazole (NEXIUM) 20 MG capsule, Take 20 mg by mouth as needed., Disp: , Rfl:    fluticasone (FLONASE) 50 MCG/ACT nasal spray, Place 2 sprays into both nostrils daily as needed for rhinitis., Disp: , Rfl:    metoprolol succinate (TOPROL-XL) 50 MG 24 hr tablet, Take 50 mg by mouth every morning., Disp: , Rfl:    olmesartan (BENICAR) 40 MG tablet, Take 40 mg by mouth at bedtime., Disp: , Rfl:    zolpidem (AMBIEN) 10 MG tablet, Take 10 mg by mouth at bedtime as needed for  sleep., Disp: , Rfl:    traMADol (ULTRAM) 50 MG tablet, Take 1 tablet (50 mg total) by mouth every 6 (six) hours as needed. (Patient not taking: Reported on 02/13/2021), Disp: 30 tablet, Rfl: 0 No current facility-administered medications for this visit.  Facility-Administered Medications Ordered in Other Visits:    sodium chloride flush (NS) 0.9 % injection 10 mL, 10 mL, Intravenous, Once, Sindy Guadeloupe, MD   sodium chloride flush (NS) 0.9 % injection 10 mL, 10 mL, Intravenous, PRN, Sindy Guadeloupe, MD, 10 mL at 01/26/21 0807  Physical exam:  Vitals:   02/13/21 1351  BP: 118/67  Pulse: (!) 101  Resp: 17  Temp: (!) 97.2 F (36.2 C)  TempSrc: Tympanic  SpO2: 100%  Weight: 149 lb 8 oz (67.8 kg)   Physical Exam Constitutional:      General: She is not in acute distress. Cardiovascular:     Rate and Rhythm: Normal rate and regular rhythm.     Heart sounds: Normal heart sounds.  Pulmonary:     Effort: Pulmonary effort is normal.     Breath  sounds: Normal breath sounds.  Abdominal:     General: Bowel sounds are normal.     Palpations: Abdomen is soft.  Skin:    General: Skin is warm and dry.  Neurological:     Mental Status: She is alert and oriented to person, place, and time.     CMP Latest Ref Rng & Units 02/13/2021  Glucose 70 - 99 mg/dL 115(H)  BUN 8 - 23 mg/dL 9  Creatinine 0.44 - 1.00 mg/dL 0.64  Sodium 135 - 145 mmol/L 133(L)  Potassium 3.5 - 5.1 mmol/L 3.4(L)  Chloride 98 - 111 mmol/L 98  CO2 22 - 32 mmol/L 25  Calcium 8.9 - 10.3 mg/dL 9.0  Total Protein 6.5 - 8.1 g/dL 7.3  Total Bilirubin 0.3 - 1.2 mg/dL 0.5  Alkaline Phos 38 - 126 U/L 46  AST 15 - 41 U/L 24  ALT 0 - 44 U/L 18   CBC Latest Ref Rng & Units 02/13/2021  WBC 4.0 - 10.5 K/uL 2.0(L)  Hemoglobin 12.0 - 15.0 g/dL 8.7(L)  Hematocrit 36.0 - 46.0 % 26.1(L)  Platelets 150 - 400 K/uL 283    No images are attached to the encounter.  CT CHEST ABDOMEN PELVIS W CONTRAST  Result Date: 02/13/2021 CLINICAL DATA:  History of lung cancer in a 65 year old female. EXAM: CT CHEST, ABDOMEN, AND PELVIS WITH CONTRAST TECHNIQUE: Multidetector CT imaging of the chest, abdomen and pelvis was performed following the standard protocol during bolus administration of intravenous contrast. CONTRAST:  129mL OMNIPAQUE IOHEXOL 300 MG/ML  SOLN COMPARISON:  Comparison evaluation of August select August November 05, 2020. FINDINGS: CT CHEST FINDINGS Cardiovascular: Calcified atheromatous plaque in the thoracic aorta. Normal caliber of the thoracic aorta. Normal heart size without pericardial effusion. Central pulmonary vasculature is unremarkable. Mediastinum/Nodes: No internal mammary, thoracic inlet or axillary lymphadenopathy. RIGHT hilar soft tissue and infrahilar soft tissues similar to previous imaging. Lungs/Pleura: Mass along the RIGHT infrahilar region measures approximately 5.3 x 2.1 cm, when measured in a similar fashion this was approximately 6.3 x 2.4 cm. Peribronchovascular  ground-glass and septal thickening with decreased appearance as well since previous imaging decreased bulk of the mass at the level of the RIGHT mainstem bronchus on image 67 of series 3 as well with improved aeration of lung parenchyma, at this level on the prior study this area measured as much as 3.8 x 5.0  cm, at most 3.6 x 4.5 cm but this includes some lung that has become aerated in the interval and or cavitation. No effusion. No consolidative changes. Airways are patent into the LEFT chest and some persistent narrowing of RIGHT lower lobe bronchi and peribronchovascular thickening in the RIGHT upper lobe. Musculoskeletal: See below for full musculoskeletal detail. CT ABDOMEN PELVIS FINDINGS Hepatobiliary: No focal, suspicious hepatic lesion. No pericholecystic stranding. No biliary duct dilation. Portal vein is patent. Pancreas: Normal, without mass, inflammation or ductal dilatation. Spleen: Spleen normal size and contour without focal lesion. Adrenals/Urinary Tract: Adrenal glands are normal. Symmetric renal enhancement. No hydronephrosis. Smooth contours of the urinary bladder. No suspicious renal lesion. Stomach/Bowel: Stomach, small bowel and appendix with normal CT appearance. No acute gastrointestinal process. Vascular/Lymphatic: Calcified atheromatous plaque of the abdominal aorta. No aneurysmal dilation. Smooth contour of the IVC. There is no gastrohepatic or hepatoduodenal ligament lymphadenopathy. No retroperitoneal or mesenteric lymphadenopathy. No pelvic sidewall lymphadenopathy. Reproductive: Unremarkable by CT. Other: No ascites.  No peritoneal nodularity. Musculoskeletal: No acute bone finding. No destructive bone process. Spinal degenerative changes. IMPRESSION: 1. Decreased bulk of the RIGHT hilar/infrahilar mass with improved aeration of lung parenchyma versus cavitation in the superior aspect of the bulky portion of the mass. Attention on follow-up. 2. 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 3. No new or progressive findings. 4. Aortic atherosclerosis. Aortic Atherosclerosis (ICD10-I70.0). Electronically Signed   By: Zetta Bills M.D.   On: 02/13/2021 12:03     Assessment and plan- Patient is a 65 y.o. female with adenocarcinoma of the right lung clinical stage III acT3 N1 M0.  She is s/p concurrent chemoradiation with weekly CarboTaxol and here to discuss CT scan results and further management  I have reviewed CT chest abdomen and pelvis images independently and discussed results with the patient.There has been an overall reduction in the size of the right infrahilar mass from prior 6.3 x 2.4 cm to 5.3 x 2.1 cm presently.  Mass at the level of the right mainstem bronchus also appears decreased in size improving appearance of presumed lymphangitic tumor involving the right upper lobe as well.  Patient has now completed her concurrent chemoradiation and I would recommend proceeding with maintenance durvalumab given every 2 to 4 weeks for 1 year  Discussed risks and benefits of immunotherapy including all but not limited to risk of autoimmune side effect such as pneumonitis thyroiditis colitis need to monitor kidney and liver functions as well as endocrinopathies.  Treatment will be given with a curative intent.  Patient understands and agrees to proceed as planned.  I will tentatively see her back in 2 weeks to start first cycle of adjuvant durvalumab.  If she tolerates first few cycles well I will switch her to her monthly dosing.  Left knee pain: Patient states that it started after her Neupogen shot which is likely unrelated.  She is currently on as needed tramadol and if we are unable to wean her off pain medications I will refer her to orthopedics     Visit Diagnosis 1. Malignant neoplasm of lower lobe of right lung ([place])   2. Encounter for antineoplastic immunotherapy      Dr. Randa Evens, MD, MPH [facility] [phone] 02/14/2021 8:35 PM

## 2021-02-16 ENCOUNTER — Encounter: Payer: Self-pay | Admitting: Oncology

## 2021-02-18 ENCOUNTER — Ambulatory Visit: Payer: BC Managed Care – PPO

## 2021-02-23 ENCOUNTER — Encounter: Payer: Self-pay | Admitting: Radiation Oncology

## 2021-02-23 ENCOUNTER — Ambulatory Visit
Admission: RE | Admit: 2021-02-23 | Discharge: 2021-02-23 | Disposition: A | Discharge status: Home / Self Care | Payer: BC Managed Care – PPO | Source: Ambulatory Visit | Attending: Radiation Oncology | Admitting: Radiation Oncology

## 2021-02-23 VITALS — BP 147/73 | HR 97 | Temp 97.3°F | Resp 16 | Wt 148.6 lb

## 2021-02-23 DIAGNOSIS — Z9221 Personal history of antineoplastic chemotherapy: Secondary | ICD-10-CM | POA: Insufficient documentation

## 2021-02-23 DIAGNOSIS — Z923 Personal history of irradiation: Secondary | ICD-10-CM | POA: Insufficient documentation

## 2021-02-23 DIAGNOSIS — C3431 Malignant neoplasm of lower lobe, right bronchus or lung: Secondary | ICD-10-CM | POA: Insufficient documentation

## 2021-02-23 DIAGNOSIS — C3490 Malignant neoplasm of unspecified part of unspecified bronchus or lung: Secondary | ICD-10-CM

## 2021-02-23 NOTE — Progress Notes (Signed)
Radiation Oncology Follow up Note  Name: Katherine Perry   Date:   02/23/2021 MRN:  [id number] DOB: Mar 07, 1956    This 65 y.o. female presents to the clinic today for 1 month follow-up status post concurrent chemoradiation therapy for stage III adenocarcinoma the right lung.  REFERRING PROVIDER: Rusty Aus, MD  HPI: Patient is a.  65 year old female stage III adenocarcinoma clinical T3 N1 M0 now at 1 month having completed concurrent chemoradiation therapy for right lower lobe adenocarcinoma.  Seen today in routine follow-up she is doing fairly well she specifically denies cough hemoptysis or chest tightness.  She had a CT scan about 2 weeks prior showing overall reduction in size of right infrahilar mass and right lower lobe mass.  She is scheduled to start maintenancedurvalumab given every 2 to 4 weeks for 1 year.  COMPLICATIONS OF TREATMENT: none  FOLLOW UP COMPLIANCE: keeps appointments   PHYSICAL EXAM:  BP (!) 147/73   Pulse 97   Temp (!) 97.3 F (36.3 C) (Tympanic)   Resp 16   Wt 148 lb 9.6 oz (67.4 kg)   BMI 24.73 kg/m  Well-developed well-nourished patient in NAD. HEENT reveals PERLA, EOMI, discs not visualized.  Oral cavity is clear. No oral mucosal lesions are identified. Neck is clear without evidence of cervical or supraclavicular adenopathy. Lungs are clear to A&P. Cardiac examination is essentially unremarkable with regular rate and rhythm without murmur rub or thrill. Abdomen is benign with no organomegaly or masses noted. Motor sensory and DTR levels are equal and symmetric in the upper and lower extremities. Cranial nerves II through XII are grossly intact. Proprioception is intact. No peripheral adenopathy or edema is identified. No motor or sensory levels are noted. Crude visual fields are within normal range.  RADIOLOGY RESULTS: CT scan reviewed compatible with above-stated findings  PLAN: Present time patient is doing well 1 month out from concurrent chemoradiation  therapy with excellent response by initial CT scan.  I am pleased with her overall progress.  She continues maintenance immunotherapy for 1 year.  I will see her back in 3 to 4 months for follow-up.  Patient knows to call with any concerns.  I would like to take this opportunity to thank you for allowing me to participate in the care of your patient.Noreene Filbert, MD

## 2021-02-26 ENCOUNTER — Other Ambulatory Visit: Payer: Self-pay | Admitting: *Deleted

## 2021-02-26 DIAGNOSIS — C3431 Malignant neoplasm of lower lobe, right bronchus or lung: Secondary | ICD-10-CM

## 2021-02-27 ENCOUNTER — Other Ambulatory Visit: Payer: Self-pay

## 2021-02-27 ENCOUNTER — Encounter: Payer: Self-pay | Admitting: Oncology

## 2021-02-27 ENCOUNTER — Inpatient Hospital Stay: Payer: BC Managed Care – PPO

## 2021-02-27 ENCOUNTER — Inpatient Hospital Stay (HOSPITAL_BASED_OUTPATIENT_CLINIC_OR_DEPARTMENT_OTHER): Payer: BC Managed Care – PPO | Admitting: Oncology

## 2021-02-27 VITALS — BP 148/76 | HR 99 | Resp 16

## 2021-02-27 VITALS — BP 122/53 | HR 106 | Temp 98.0°F | Resp 16 | Wt 145.6 lb

## 2021-02-27 DIAGNOSIS — D6481 Anemia due to antineoplastic chemotherapy: Secondary | ICD-10-CM | POA: Diagnosis not present

## 2021-02-27 DIAGNOSIS — C3431 Malignant neoplasm of lower lobe, right bronchus or lung: Secondary | ICD-10-CM | POA: Diagnosis not present

## 2021-02-27 DIAGNOSIS — T451X5A Adverse effect of antineoplastic and immunosuppressive drugs, initial encounter: Secondary | ICD-10-CM

## 2021-02-27 DIAGNOSIS — M25562 Pain in left knee: Secondary | ICD-10-CM | POA: Diagnosis not present

## 2021-02-27 DIAGNOSIS — Z5111 Encounter for antineoplastic chemotherapy: Secondary | ICD-10-CM

## 2021-02-27 DIAGNOSIS — G8929 Other chronic pain: Secondary | ICD-10-CM

## 2021-02-27 LAB — CBC WITH DIFFERENTIAL/PLATELET
Abs Immature Granulocytes: 0.03 10*3/uL (ref 0.00–0.07)
Basophils Absolute: 0 10*3/uL (ref 0.0–0.1)
Basophils Relative: 1 %
Eosinophils Absolute: 0 10*3/uL (ref 0.0–0.5)
Eosinophils Relative: 0 %
HCT: 25.7 % — ABNORMAL LOW (ref 36.0–46.0)
Hemoglobin: 8.6 g/dL — ABNORMAL LOW (ref 12.0–15.0)
Immature Granulocytes: 1 %
Lymphocytes Relative: 28 %
Lymphs Abs: 1.4 10*3/uL (ref 0.7–4.0)
MCH: 35.2 pg — ABNORMAL HIGH (ref 26.0–34.0)
MCHC: 33.5 g/dL (ref 30.0–36.0)
MCV: 105.3 fL — ABNORMAL HIGH (ref 80.0–100.0)
Monocytes Absolute: 0.6 10*3/uL (ref 0.1–1.0)
Monocytes Relative: 11 %
Neutro Abs: 3 10*3/uL (ref 1.7–7.7)
Neutrophils Relative %: 59 %
Platelets: 158 10*3/uL (ref 150–400)
RBC: 2.44 MIL/uL — ABNORMAL LOW (ref 3.87–5.11)
RDW: 19.6 % — ABNORMAL HIGH (ref 11.5–15.5)
WBC: 5.1 10*3/uL (ref 4.0–10.5)
nRBC: 0 % (ref 0.0–0.2)

## 2021-02-27 LAB — COMPREHENSIVE METABOLIC PANEL
ALT: 15 U/L (ref 0–44)
AST: 22 U/L (ref 15–41)
Albumin: 3.5 g/dL (ref 3.5–5.0)
Alkaline Phosphatase: 60 U/L (ref 38–126)
Anion gap: 10 (ref 5–15)
BUN: 12 mg/dL (ref 8–23)
CO2: 23 mmol/L (ref 22–32)
Calcium: 8.9 mg/dL (ref 8.9–10.3)
Chloride: 100 mmol/L (ref 98–111)
Creatinine, Ser: 0.55 mg/dL (ref 0.44–1.00)
GFR, Estimated: >60 mL/min (ref >60)
Glucose, Bld: 122 mg/dL — ABNORMAL HIGH (ref 70–99)
Potassium: 4.4 mmol/L (ref 3.5–5.1)
Sodium: 133 mmol/L — ABNORMAL LOW (ref 135–145)
Total Bilirubin: 0.4 mg/dL (ref 0.3–1.2)
Total Protein: 7.2 g/dL (ref 6.5–8.1)

## 2021-02-27 LAB — TSH: TSH: 1.929 u[IU]/mL (ref 0.350–4.500)

## 2021-02-27 MED ORDER — HEPARIN SOD (PORK) LOCK FLUSH 100 UNIT/ML IV SOLN
INTRAVENOUS | Status: AC
  Filled 2021-02-27: qty 5

## 2021-02-27 MED ORDER — SODIUM CHLORIDE 0.9 % IV SOLN
Freq: Once | INTRAVENOUS | Status: AC
  Filled 2021-02-27: qty 250

## 2021-02-27 MED ORDER — SODIUM CHLORIDE 0.9 % IV SOLN
10.0000 mg/kg | Freq: Once | INTRAVENOUS | Status: AC
  Administered 2021-02-27: 620 mg via INTRAVENOUS
  Filled 2021-02-27: qty 2.4

## 2021-02-27 MED ORDER — HEPARIN SOD (PORK) LOCK FLUSH 100 UNIT/ML IV SOLN
500.0000 [IU] | Freq: Once | INTRAVENOUS | Status: AC | PRN
  Administered 2021-02-27: 500 [IU]
  Filled 2021-02-27: qty 5

## 2021-02-27 NOTE — Progress Notes (Signed)
Patient here pretreatment check. She reports that her left knee is still painful and that tramadol has not helped she also is still having a persistent cough she is using OTC Tussinex.

## 2021-02-27 NOTE — Progress Notes (Signed)
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  She quit smoking about 26 years ago.  Other medical problems include hypertension and hyponatremia.She has been treated for iron of possible pneumonia for the last 1 year.  She has a history of intermittent asthma for which she uses Advair.  She was seen by Dr. And underwent CT chest which showed dense infiltrate/solid mass in the right lower lobe with contiguous airspace opacity in the right upper lobe.  Findings could be secondary to pneumonia but concerning for bronchogenic carcinoma.  Patient then had a PET CT scan which showed consolidation in the medial aspect of the right lower lobe measuring 3.4 x 4 cm with an SUV of 11.6.  Hypermetabolic masslike thickening in the right hilum measuring 2.2 cm with intense hypermetabolic activity.  Groundglass airspace consolidation in the posterior aspect of the right upper lobe with an SUV of 11.4.  The right upper lobe opacity was nonspecific and differentials include pulmonary infection versus lymphangitic spread of carcinoma.  CT chest showed showed masslike area of distortion involving right lower lobe middle lobe as well as right upper lobe.  Generalized right hilar masslike appearance concerning for nodal involvement.   Patient underwent bronchoscopy and biopsies.Right lower lobe was concerning for adenocarcinoma.  Lymph node station 11  R, 4R, 7 were negative for malignancy.  No malignant cells identified in the right upper lobe.  Case was discussed at tumor board and given the hypermetabolism in the right upper lobe as well as hilum involvement cannot be ruled out despite negative biopsies.  Patient completed concurrent chemoradiation with weekly CarboTaxol in July 2022.  Scan showed partial response.  Maintenance durvalumab started on 02/27/2021    Interval history-reports ongoing fatigue as well as left knee pain which has not improved despite trying as needed tramadol.  She is also applying icy hot and feels that Aleve helps better than tramadol.  ECOG PS- 1 Pain scale- 3 Opioid associated constipation- no  Review of systems- Review of Systems  Constitutional:  Positive for malaise/fatigue. Negative for chills, fever and weight loss.  HENT:  Negative for congestion, ear discharge and nosebleeds.   Eyes:  Negative for blurred vision.  Respiratory:  Negative for cough, hemoptysis, sputum production, shortness of breath and wheezing.   Cardiovascular:  Negative for chest pain, palpitations, orthopnea and claudication.  Gastrointestinal:  Negative for abdominal pain, blood in stool, constipation, diarrhea, heartburn, melena, nausea and vomiting.  Genitourinary:  Negative for dysuria, flank pain, frequency, hematuria and urgency.  Musculoskeletal:  Negative for back pain, joint pain and myalgias.       Left knee pain  Skin:  Negative for rash.  Neurological:  Negative for dizziness, tingling, focal weakness, seizures, weakness and headaches.  Endo/Heme/Allergies:  Does not bruise/bleed easily.  Psychiatric/Behavioral:  Negative for depression and suicidal ideas. The patient does not have insomnia.      No Known Allergies   Past Medical History:  Diagnosis  Date   Anxiety    Asthma    Cough    Dyspnea    with exertion   GERD (gastroesophageal reflux disease)    Hypertension      Past Surgical History:  Procedure  Laterality Date   BUNIONECTOMY Bilateral    COLONOSCOPY     PORTA CATH INSERTION N/A 12/22/2020   Procedure: PORTA CATH INSERTION;  Surgeon: Algernon Huxley, MD;  Location: [facility];  Service: Cardiovascular;  Laterality: N/A;   VIDEO BRONCHOSCOPY WITH ENDOBRONCHIAL NAVIGATION N/A 11/07/2020   Procedure: VIDEO BRONCHOSCOPY WITH ENDOBRONCHIAL NAVIGATION;  Surgeon: Ottie Glazier, MD;  Location: [facility];  Service: Thoracic;  Laterality: N/A;   VIDEO BRONCHOSCOPY WITH ENDOBRONCHIAL ULTRASOUND N/A 11/07/2020   Procedure: VIDEO BRONCHOSCOPY WITH ENDOBRONCHIAL ULTRASOUND;  Surgeon: Ottie Glazier, MD;  Location: [facility];  Service: Thoracic;  Laterality: N/A;    Social History   Socioeconomic History   Marital status: Married    Spouse name: Not on file   Number of children: Not on file   Years of education: Not on file   Highest education level: Not on file  Occupational History   Not on file  Tobacco Use   Smoking status: Former    Packs/day: 1.00    Years: 15.00    Pack years: 15.00    Types: Cigarettes    Quit date: 11/07/1994    Years since quitting: 26.3   Smokeless tobacco: Never  Vaping Use   Vaping Use: Never used  Substance and Sexual Activity   Alcohol use: Yes    Comment:  wine daily   Drug use: Never   Sexual activity: Not on file  Other Topics Concern   Not on file  Social History Narrative   Not on file   Social Determinants of Health   Financial Resource Strain: Not on file  Food Insecurity: Not on file  Transportation Needs: Not on file  Physical Activity: Not on file  Stress: Not on file  Social Connections: Not on file  Intimate Partner Violence: Not on file    History reviewed. No pertinent family history.   Current Outpatient Medications:    acetaminophen (TYLENOL) 500 MG tablet, Take 500 mg by mouth every 6 (six) hours as needed., Disp: , Rfl:    ADVAIR DISKUS 250-50 MCG/DOSE AEPB, Inhale 1 puff into the lungs as needed., Disp: , Rfl:     ALPRAZolam (XANAX) 0.5 MG tablet, Take 0.5 mg by mouth at bedtime as needed for sleep., Disp: , Rfl:    amLODipine (NORVASC) 5 MG tablet, Take 5 mg by mouth at bedtime., Disp: , Rfl:    chlorpheniramine-HYDROcodone (TUSSIONEX) 10-8 MG/5ML SUER, Take 5 mLs by mouth every 12 (twelve) hours as needed for cough., Disp: 140 mL, Rfl: 0   Cholecalciferol 50 MCG (2000 UT) CAPS, Take 2,000 Units by mouth daily., Disp: , Rfl:    docusate sodium (COLACE) 100 MG capsule, Take 100 mg by mouth daily., Disp: , Rfl:    esomeprazole (NEXIUM) 20 MG capsule, Take 20 mg by mouth as needed., Disp: , Rfl:    fluticasone (FLONASE) 50 MCG/ACT nasal spray, Place 2 sprays into both nostrils daily as needed for rhinitis., Disp: , Rfl:    metoprolol succinate (TOPROL-XL) 50 MG 24 hr tablet, Take 50 mg by mouth every morning., Disp: , Rfl:    olmesartan (BENICAR) 40 MG tablet, Take 40 mg by mouth at bedtime., Disp: , Rfl:    traMADol (ULTRAM) 50 MG  tablet, Take 1 tablet (50 mg total) by mouth every 6 (six) hours as needed., Disp: 30 tablet, Rfl: 0   zolpidem (AMBIEN) 10 MG tablet, Take 10 mg by mouth at bedtime as needed for sleep., Disp: , Rfl:  No current facility-administered medications for this visit.  Facility-Administered Medications Ordered in Other Visits:    sodium chloride flush (NS) 0.9 % injection 10 mL, 10 mL, Intravenous, Once, Sindy Guadeloupe, MD   sodium chloride flush (NS) 0.9 % injection 10 mL, 10 mL, Intravenous, PRN, Sindy Guadeloupe, MD, 10 mL at 01/26/21 0807  Physical exam:  Vitals:   02/27/21 0853  BP: (!) 122/53  Pulse: (!) 106  Resp: 16  Temp: 98 F (36.7 C)  TempSrc: Tympanic  Weight: 145 lb 9.6 oz (66 kg)   Physical Exam Constitutional:      General: She is not in acute distress. Cardiovascular:     Rate and Rhythm: Normal rate and regular rhythm.     Heart sounds: Normal heart sounds.  Pulmonary:     Effort: Pulmonary effort is normal.     Breath sounds: Normal breath sounds.   Abdominal:     General: Bowel sounds are normal.     Palpations: Abdomen is soft.  Skin:    General: Skin is warm and dry.  Neurological:     Mental Status: She is alert and oriented to person, place, and time.     CMP Latest Ref Rng & Units 02/27/2021  Glucose 70 - 99 mg/dL 122(H)  BUN 8 - 23 mg/dL 12  Creatinine 0.44 - 1.00 mg/dL 0.55  Sodium 135 - 145 mmol/L 133(L)  Potassium 3.5 - 5.1 mmol/L 4.4  Chloride 98 - 111 mmol/L 100  CO2 22 - 32 mmol/L 23  Calcium 8.9 - 10.3 mg/dL 8.9  Total Protein 6.5 - 8.1 g/dL 7.2  Total Bilirubin 0.3 - 1.2 mg/dL 0.4  Alkaline Phos 38 - 126 U/L 60  AST 15 - 41 U/L 22  ALT 0 - 44 U/L 15   CBC Latest Ref Rng & Units 02/27/2021  WBC 4.0 - 10.5 K/uL 5.1  Hemoglobin 12.0 - 15.0 g/dL 8.6(L)  Hematocrit 36.0 - 46.0 % 25.7(L)  Platelets 150 - 400 K/uL 158    No images are attached to the encounter.  CT CHEST ABDOMEN PELVIS W CONTRAST  Result Date: 02/13/2021 CLINICAL DATA:  History of lung cancer in a 65 year old female. EXAM: CT CHEST, ABDOMEN, AND PELVIS WITH CONTRAST TECHNIQUE: Multidetector CT imaging of the chest, abdomen and pelvis was performed following the standard protocol during bolus administration of intravenous contrast. CONTRAST:  170mL OMNIPAQUE IOHEXOL 300 MG/ML  SOLN COMPARISON:  Comparison evaluation of August select August November 05, 2020. FINDINGS: CT CHEST FINDINGS Cardiovascular: Calcified atheromatous plaque in the thoracic aorta. Normal caliber of the thoracic aorta. Normal heart size without pericardial effusion. Central pulmonary vasculature is unremarkable. Mediastinum/Nodes: No internal mammary, thoracic inlet or axillary lymphadenopathy. RIGHT hilar soft tissue and infrahilar soft tissues similar to previous imaging. Lungs/Pleura: Mass along the RIGHT infrahilar region measures approximately 5.3 x 2.1 cm, when measured in a similar fashion this was approximately 6.3 x 2.4 cm. Peribronchovascular ground-glass and septal  thickening with decreased appearance as well since previous imaging decreased bulk of the mass at the level of the RIGHT mainstem bronchus on image 67 of series 3 as well with improved aeration of lung parenchyma, at this level on the prior study this area measured as much as 3.8  x 5.0 cm, at most 3.6 x 4.5 cm but this includes some lung that has become aerated in the interval and or cavitation. No effusion. No consolidative changes. Airways are patent into the LEFT chest and some persistent narrowing of RIGHT lower lobe bronchi and peribronchovascular thickening in the RIGHT upper lobe. Musculoskeletal: See below for full musculoskeletal detail. CT ABDOMEN PELVIS FINDINGS Hepatobiliary: No focal, suspicious hepatic lesion. No pericholecystic stranding. No biliary duct dilation. Portal vein is patent. Pancreas: Normal, without mass, inflammation or ductal dilatation. Spleen: Spleen normal size and contour without focal lesion. Adrenals/Urinary Tract: Adrenal glands are normal. Symmetric renal enhancement. No hydronephrosis. Smooth contours of the urinary bladder. No suspicious renal lesion. Stomach/Bowel: Stomach, small bowel and appendix with normal CT appearance. No acute gastrointestinal process. Vascular/Lymphatic: Calcified atheromatous plaque of the abdominal aorta. No aneurysmal dilation. Smooth contour of the IVC. There is no gastrohepatic or hepatoduodenal ligament lymphadenopathy. No retroperitoneal or mesenteric lymphadenopathy. No pelvic sidewall lymphadenopathy. Reproductive: Unremarkable by CT. Other: No ascites.  No peritoneal nodularity. Musculoskeletal: No acute bone finding. No destructive bone process. Spinal degenerative changes. IMPRESSION: 1. Decreased bulk of the RIGHT hilar/infrahilar mass with improved aeration of lung parenchyma versus cavitation in the superior aspect of the bulky portion of the mass. Attention on follow-up. 2. Improving appearance of presumed lymphangitic tumor in the  RIGHT upper lobe. 3. No new or progressive findings. 4. Aortic atherosclerosis. Aortic Atherosclerosis (ICD10-I70.0). Electronically Signed   By: Zetta Bills M.D.   On: 02/13/2021 12:03     Assessment and plan- Patient is a 65 y.o. female with adenocarcinoma of the right lung clinical stage III acT3 N1 M0.  She is s/p concurrent chemoradiation with weekly CarboTaxol and here for on treatment assessment for cycle 1 of maintenance durvalumab  Counts okay to proceed with cycle 1 of maintenance durvalumab today and I will see her back in 2 weeks for cycle 2  Left knee pain: Unlikely to be related to Neupogen which she has not had for over 3 weeks now but pain persists.  She is on as needed tramadol and also uses occasional Aleve.  We did discuss orthopedics referral and she has seen [facility] in the past and will get in touch with them  Chemo induced anemia:She will no longer be getting any chemotherapy since she is on maintenance durvalumab.  I will add ferritin and iron studies B12 and folate within next set of labs   Visit Diagnosis 1. Encounter for antineoplastic chemotherapy   2. Malignant neoplasm of lower lobe of right lung ([place])   3. Antineoplastic chemotherapy induced anemia   4. Chronic pain of left knee      Dr. Randa Evens, MD, MPH [facility] [phone] 02/27/2021 12:02 PM

## 2021-02-27 NOTE — Patient Instructions (Signed)
[facility] [place] ONCOLOGY  Discharge Instructions: Thank you for choosing [facility] to provide your oncology and hematology care.  If you have a lab appointment with the [facility], please go directly to the [facility] and check in at the registration area.  Wear comfortable clothing and clothing appropriate for easy access to any Portacath or PICC line.   We strive to give you quality time with your provider. You may need to reschedule your appointment if you arrive late (15 or more minutes).  Arriving late affects you and other patients whose appointments are after yours.  Also, if you miss three or more appointments without notifying the office, you may be dismissed from the clinic at the provider's discretion.      For prescription refill requests, have your pharmacy contact our office and allow 72 hours for refills to be completed.    Today you received the following chemotherapy and/or immunotherapy agents Durvalumab      To help prevent nausea and vomiting after your treatment, we encourage you to take your nausea medication as directed.  BELOW ARE SYMPTOMS THAT SHOULD BE REPORTED IMMEDIATELY: *FEVER GREATER THAN 100.4 F (38 C) OR HIGHER *CHILLS OR SWEATING *NAUSEA AND VOMITING THAT IS NOT CONTROLLED WITH YOUR NAUSEA MEDICATION *UNUSUAL SHORTNESS OF BREATH *UNUSUAL BRUISING OR BLEEDING *URINARY PROBLEMS (pain or burning when urinating, or frequent urination) *BOWEL PROBLEMS (unusual diarrhea, constipation, pain near the anus) TENDERNESS IN MOUTH AND THROAT WITH OR WITHOUT PRESENCE OF ULCERS (sore throat, sores in mouth, or a toothache) UNUSUAL RASH, SWELLING OR PAIN  UNUSUAL VAGINAL DISCHARGE OR ITCHING   Items with * indicate a potential emergency and should be followed up as soon as possible or go to the Emergency Department if any problems should occur.  Please show the CHEMOTHERAPY ALERT CARD or IMMUNOTHERAPY ALERT CARD at check-in  to the Emergency Department and triage nurse.  Should you have questions after your visit or need to cancel or reschedule your appointment, please contact [facility]  [phone] and follow the prompts.  Office hours are 8:00 a.m. to 4:30 p.m. Monday - Friday. Please note that voicemails left after 4:00 p.m. may not be returned until the following business day.  We are closed weekends and major holidays. You have access to a nurse at all times for urgent questions. Please call the main number to the clinic [phone] and follow the prompts.  For any non-urgent questions, you may also contact your provider using MyChart. We now offer e-Visits for anyone 46 and older to request care online for non-urgent symptoms. For details visit [email or web address].   Also download the MyChart app! Go to the app store, search "MyChart", open the app, select [facility], and log in with your MyChart username and password.  Due to Covid, a mask is required upon entering the hospital/clinic. If you do not have a mask, one will be given to you upon arrival. For doctor visits, patients may have 1 support person aged 54 or older with them. For treatment visits, patients cannot have anyone with them due to current Covid guidelines and our immunocompromised population.

## 2021-02-27 NOTE — Progress Notes (Signed)
HR 106. Per Dr. Janese Banks, okay to proceed.

## 2021-03-01 ENCOUNTER — Other Ambulatory Visit: Payer: Self-pay | Admitting: Radiation Oncology

## 2021-03-01 DIAGNOSIS — C3490 Malignant neoplasm of unspecified part of unspecified bronchus or lung: Secondary | ICD-10-CM

## 2021-03-13 ENCOUNTER — Inpatient Hospital Stay: Payer: BC Managed Care – PPO

## 2021-03-13 ENCOUNTER — Other Ambulatory Visit: Payer: Self-pay

## 2021-03-13 ENCOUNTER — Inpatient Hospital Stay (HOSPITAL_BASED_OUTPATIENT_CLINIC_OR_DEPARTMENT_OTHER): Payer: BC Managed Care – PPO | Admitting: Oncology

## 2021-03-13 ENCOUNTER — Inpatient Hospital Stay: Payer: BC Managed Care – PPO | Attending: Oncology

## 2021-03-13 VITALS — BP 112/57 | HR 99 | Temp 97.7°F | Resp 20 | Wt 145.3 lb

## 2021-03-13 DIAGNOSIS — Z5112 Encounter for antineoplastic immunotherapy: Secondary | ICD-10-CM | POA: Insufficient documentation

## 2021-03-13 DIAGNOSIS — Z79899 Other long term (current) drug therapy: Secondary | ICD-10-CM | POA: Insufficient documentation

## 2021-03-13 DIAGNOSIS — C3431 Malignant neoplasm of lower lobe, right bronchus or lung: Secondary | ICD-10-CM | POA: Diagnosis present

## 2021-03-13 DIAGNOSIS — Z5111 Encounter for antineoplastic chemotherapy: Secondary | ICD-10-CM

## 2021-03-13 DIAGNOSIS — D539 Nutritional anemia, unspecified: Secondary | ICD-10-CM | POA: Diagnosis not present

## 2021-03-13 LAB — FOLATE: Folate: 3.6 ng/mL — ABNORMAL LOW (ref >5.9)

## 2021-03-13 LAB — CBC WITH DIFFERENTIAL/PLATELET
Abs Immature Granulocytes: 0.04 10*3/uL (ref 0.00–0.07)
Basophils Absolute: 0 10*3/uL (ref 0.0–0.1)
Basophils Relative: 0 %
Eosinophils Absolute: 0.1 10*3/uL (ref 0.0–0.5)
Eosinophils Relative: 1 %
HCT: 29.3 % — ABNORMAL LOW (ref 36.0–46.0)
Hemoglobin: 9.8 g/dL — ABNORMAL LOW (ref 12.0–15.0)
Immature Granulocytes: 1 %
Lymphocytes Relative: 41 %
Lymphs Abs: 1.7 10*3/uL (ref 0.7–4.0)
MCH: 37.1 pg — ABNORMAL HIGH (ref 26.0–34.0)
MCHC: 33.4 g/dL (ref 30.0–36.0)
MCV: 111 fL — ABNORMAL HIGH (ref 80.0–100.0)
Monocytes Absolute: 0.3 10*3/uL (ref 0.1–1.0)
Monocytes Relative: 8 %
Neutro Abs: 1.9 10*3/uL (ref 1.7–7.7)
Neutrophils Relative %: 49 %
Platelets: 300 10*3/uL (ref 150–400)
RBC: 2.64 MIL/uL — ABNORMAL LOW (ref 3.87–5.11)
RDW: 18.6 % — ABNORMAL HIGH (ref 11.5–15.5)
WBC: 4 10*3/uL (ref 4.0–10.5)
nRBC: 0 % (ref 0.0–0.2)

## 2021-03-13 LAB — VITAMIN B12: Vitamin B-12: 480 pg/mL (ref 180–914)

## 2021-03-13 LAB — COMPREHENSIVE METABOLIC PANEL
ALT: 15 U/L (ref 0–44)
AST: 23 U/L (ref 15–41)
Albumin: 3.7 g/dL (ref 3.5–5.0)
Alkaline Phosphatase: 47 U/L (ref 38–126)
Anion gap: 11 (ref 5–15)
BUN: 15 mg/dL (ref 8–23)
CO2: 23 mmol/L (ref 22–32)
Calcium: 9.6 mg/dL (ref 8.9–10.3)
Chloride: 98 mmol/L (ref 98–111)
Creatinine, Ser: 0.74 mg/dL (ref 0.44–1.00)
GFR, Estimated: >60 mL/min (ref >60)
Glucose, Bld: 105 mg/dL — ABNORMAL HIGH (ref 70–99)
Potassium: 4.4 mmol/L (ref 3.5–5.1)
Sodium: 132 mmol/L — ABNORMAL LOW (ref 135–145)
Total Bilirubin: 0.7 mg/dL (ref 0.3–1.2)
Total Protein: 7.5 g/dL (ref 6.5–8.1)

## 2021-03-13 LAB — FERRITIN: Ferritin: 160 ng/mL (ref 11–307)

## 2021-03-13 LAB — IRON AND TIBC
Iron: 84 ug/dL (ref 28–170)
Saturation Ratios: 21 % (ref 10.4–31.8)
TIBC: 398 ug/dL (ref 250–450)
UIBC: 314 ug/dL

## 2021-03-13 MED ORDER — FOLIC ACID 1 MG PO TABS: 1.0000 mg | ORAL_TABLET | Freq: Every day | ORAL | 2 refills | Status: DC

## 2021-03-13 MED ORDER — SODIUM CHLORIDE 0.9 % IV SOLN
10.0000 mg/kg | Freq: Once | INTRAVENOUS | Status: AC
  Administered 2021-03-13: 620 mg via INTRAVENOUS
  Filled 2021-03-13: qty 10

## 2021-03-13 MED ORDER — GABAPENTIN 300 MG PO CAPS: 300.0000 mg | ORAL_CAPSULE | Freq: Three times a day (TID) | ORAL | 0 refills | Status: DC

## 2021-03-13 MED ORDER — HEPARIN SOD (PORK) LOCK FLUSH 100 UNIT/ML IV SOLN
INTRAVENOUS | Status: AC
  Administered 2021-03-13: 500 [IU]
  Filled 2021-03-13: qty 5

## 2021-03-13 MED ORDER — HEPARIN SOD (PORK) LOCK FLUSH 100 UNIT/ML IV SOLN
500.0000 [IU] | Freq: Once | INTRAVENOUS | Status: AC | PRN
  Filled 2021-03-13: qty 5

## 2021-03-13 MED ORDER — SODIUM CHLORIDE 0.9 % IV SOLN
Freq: Once | INTRAVENOUS | Status: AC
  Filled 2021-03-13: qty 250

## 2021-03-13 MED ORDER — SODIUM CHLORIDE 0.9% FLUSH
10.0000 mL | INTRAVENOUS | Status: DC | PRN
  Filled 2021-03-13: qty 10

## 2021-03-13 NOTE — Progress Notes (Signed)
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  She quit smoking about 26 years ago.  Other medical problems include hypertension and hyponatremia.She has been treated for iron of possible pneumonia for the last 1 year.  She has a history of intermittent asthma for which she uses Advair.  She was seen by Dr. And underwent CT chest which showed dense infiltrate/solid mass in the right lower lobe with contiguous airspace opacity in the right upper lobe.  Findings could be secondary to pneumonia but concerning for bronchogenic carcinoma.  Patient then had a PET CT scan which showed consolidation in the medial aspect of the right lower lobe measuring 3.4 x 4 cm with an SUV of 11.6.  Hypermetabolic masslike thickening in the right hilum measuring 2.2 cm with intense hypermetabolic activity.  Groundglass airspace consolidation in the posterior aspect of the right upper lobe with an SUV of 11.4.  The right upper lobe opacity was nonspecific and differentials include pulmonary infection versus lymphangitic spread of carcinoma.  CT chest showed showed masslike area of distortion involving right lower lobe middle lobe as well as right upper lobe.  Generalized right hilar masslike appearance concerning for nodal involvement.   Patient underwent bronchoscopy and biopsies.Right lower lobe was concerning for adenocarcinoma.  Lymph node station 11  R, 4R, 7 were negative for malignancy.  No malignant cells identified in the right upper lobe.  Case was discussed at tumor board and given the hypermetabolism in the right upper lobe as well as hilum involvement cannot be ruled out despite negative biopsies.  Patient completed concurrent chemoradiation with weekly CarboTaxol in July 2022.  Scan showed partial response.  Maintenance durvalumab started on 02/27/2021    Interval history-still has ongoing cough and cough medications have not been helping.  She was also seen by orthopedics for left knee pain and underwent intra-articular steroid injection which helped for a few days but she feels like the pain is coming back.  ECOG PS- 1 Pain scale- 0   Review of systems- Review of Systems  Constitutional:  Negative for chills, fever, malaise/fatigue and weight loss.  HENT:  Negative for congestion, ear discharge and nosebleeds.   Eyes:  Negative for blurred vision.  Respiratory:  Negative for cough, hemoptysis, sputum production, shortness of breath and wheezing.   Cardiovascular:  Negative for chest pain, palpitations, orthopnea and claudication.  Gastrointestinal:  Negative for abdominal pain, blood in stool, constipation, diarrhea, heartburn, melena, nausea and vomiting.  Genitourinary:  Negative for dysuria, flank pain, frequency, hematuria and urgency.  Musculoskeletal:  Negative for back pain, joint pain and myalgias.  Skin:  Negative for rash.  Neurological:  Negative for dizziness, tingling, focal weakness, seizures, weakness and headaches.  Endo/Heme/Allergies:  Does not bruise/bleed easily.  Psychiatric/Behavioral:  Negative for depression and suicidal ideas. The patient does not have insomnia.      No Known Allergies   Past Medical History:  Diagnosis Date   Anxiety  Asthma    Cough    Dyspnea    with exertion   GERD (gastroesophageal reflux disease)    Hypertension      Past Surgical History:  Procedure Laterality  Date   BUNIONECTOMY Bilateral    COLONOSCOPY     PORTA CATH INSERTION N/A 12/22/2020   Procedure: PORTA CATH INSERTION;  Surgeon: Algernon Huxley, MD;  Location: [facility];  Service: Cardiovascular;  Laterality: N/A;   VIDEO BRONCHOSCOPY WITH ENDOBRONCHIAL NAVIGATION N/A 11/07/2020   Procedure: VIDEO BRONCHOSCOPY WITH ENDOBRONCHIAL NAVIGATION;  Surgeon: Ottie Glazier, MD;  Location: [facility];  Service: Thoracic;  Laterality: N/A;   VIDEO BRONCHOSCOPY WITH ENDOBRONCHIAL ULTRASOUND N/A 11/07/2020   Procedure: VIDEO BRONCHOSCOPY WITH ENDOBRONCHIAL ULTRASOUND;  Surgeon: Ottie Glazier, MD;  Location: [facility];  Service: Thoracic;  Laterality: N/A;    Social History   Socioeconomic History   Marital status: Married    Spouse name: Not on file   Number of children: Not on file   Years of education: Not on file   Highest education level: Not on file  Occupational History   Not on file  Tobacco Use   Smoking status: Former    Packs/day: 1.00    Years: 15.00    Pack years: 15.00    Types: Cigarettes    Quit date: 11/07/1994    Years since quitting: 26.3   Smokeless tobacco: Never  Vaping Use   Vaping Use: Never used  Substance and Sexual Activity   Alcohol use: Yes    Comment:  wine daily   Drug use: Never   Sexual activity: Not on file  Other Topics Concern   Not on file  Social History Narrative   Not on file   Social Determinants of Health   Financial Resource Strain: Not on file  Food Insecurity: Not on file  Transportation Needs: Not on file  Physical Activity: Not on file  Stress: Not on file  Social Connections: Not on file  Intimate Partner Violence: Not on file    No family history on file.   Current Outpatient Medications:    acetaminophen (TYLENOL) 500 MG tablet, Take 500 mg by mouth every 6 (six) hours as needed., Disp: , Rfl:    ADVAIR DISKUS 250-50 MCG/DOSE AEPB, Inhale 1 puff into the lungs as needed., Disp: , Rfl:    ALPRAZolam (XANAX) 0.5 MG  tablet, Take 0.5 mg by mouth at bedtime as needed for sleep., Disp: , Rfl:    amLODipine (NORVASC) 5 MG tablet, Take 5 mg by mouth at bedtime., Disp: , Rfl:    chlorpheniramine-HYDROcodone (TUSSIONEX) 10-8 MG/5ML SUER, Take 5 mLs by mouth every 12 (twelve) hours as needed for cough., Disp: 140 mL, Rfl: 0   Cholecalciferol 50 MCG (2000 UT) CAPS, Take 2,000 Units by mouth daily., Disp: , Rfl:    docusate sodium (COLACE) 100 MG capsule, Take 100 mg by mouth daily., Disp: , Rfl:    esomeprazole (NEXIUM) 20 MG capsule, Take 20 mg by mouth as needed., Disp: , Rfl:    fluticasone (FLONASE) 50 MCG/ACT nasal spray, Place 2 sprays into both nostrils daily as needed for rhinitis., Disp: , Rfl:    metoprolol succinate (TOPROL-XL) 50 MG 24 hr tablet, Take 50 mg by mouth every morning., Disp: , Rfl:    olmesartan (BENICAR) 40 MG tablet, Take 40 mg by mouth at bedtime., Disp: , Rfl:    traMADol (ULTRAM) 50 MG tablet, Take 1 tablet (50 mg total) by  mouth every 6 (six) hours as needed., Disp: 30 tablet, Rfl: 0   zolpidem (AMBIEN) 10 MG tablet, Take 10 mg by mouth at bedtime as needed for sleep., Disp: , Rfl:  No current facility-administered medications for this visit.  Facility-Administered Medications Ordered in Other Visits:    sodium chloride flush (NS) 0.9 % injection 10 mL, 10 mL, Intravenous, Once, Sindy Guadeloupe, MD   sodium chloride flush (NS) 0.9 % injection 10 mL, 10 mL, Intravenous, PRN, Sindy Guadeloupe, MD, 10 mL at 01/26/21 0807  Physical exam:  Vitals:   03/13/21 0842  BP: (!) 112/57  Pulse: 99  Resp: 20  Temp: 97.7 F (36.5 C)  TempSrc: Tympanic  SpO2: 100%  Weight: 145 lb 4.8 oz (65.9 kg)   Physical Exam HENT:     Head: Normocephalic and atraumatic.  Eyes:     Pupils: Pupils are equal, round, and reactive to light.  Cardiovascular:     Rate and Rhythm: Normal rate and regular rhythm.     Heart sounds: Normal heart sounds.  Pulmonary:     Effort: Pulmonary effort is normal.      Breath sounds: Normal breath sounds.  Abdominal:     General: Bowel sounds are normal.     Palpations: Abdomen is soft.  Musculoskeletal:     Cervical back: Normal range of motion.  Skin:    General: Skin is warm and dry.  Neurological:     Mental Status: She is alert and oriented to person, place, and time.     CMP Latest Ref Rng & Units 02/27/2021  Glucose 70 - 99 mg/dL 122(H)  BUN 8 - 23 mg/dL 12  Creatinine 0.44 - 1.00 mg/dL 0.55  Sodium 135 - 145 mmol/L 133(L)  Potassium 3.5 - 5.1 mmol/L 4.4  Chloride 98 - 111 mmol/L 100  CO2 22 - 32 mmol/L 23  Calcium 8.9 - 10.3 mg/dL 8.9  Total Protein 6.5 - 8.1 g/dL 7.2  Total Bilirubin 0.3 - 1.2 mg/dL 0.4  Alkaline Phos 38 - 126 U/L 60  AST 15 - 41 U/L 22  ALT 0 - 44 U/L 15   CBC Latest Ref Rng & Units 03/13/2021  WBC 4.0 - 10.5 K/uL 4.0  Hemoglobin 12.0 - 15.0 g/dL 9.8(L)  Hematocrit 36.0 - 46.0 % 29.3(L)  Platelets 150 - 400 K/uL 300    No images are attached to the encounter.  CT CHEST ABDOMEN PELVIS W CONTRAST  Result Date: 02/13/2021 CLINICAL DATA:  History of lung cancer in a 65 year old female. EXAM: CT CHEST, ABDOMEN, AND PELVIS WITH CONTRAST TECHNIQUE: Multidetector CT imaging of the chest, abdomen and pelvis was performed following the standard protocol during bolus administration of intravenous contrast. CONTRAST:  125mL OMNIPAQUE IOHEXOL 300 MG/ML  SOLN COMPARISON:  Comparison evaluation of August select August November 05, 2020. FINDINGS: CT CHEST FINDINGS Cardiovascular: Calcified atheromatous plaque in the thoracic aorta. Normal caliber of the thoracic aorta. Normal heart size without pericardial effusion. Central pulmonary vasculature is unremarkable. Mediastinum/Nodes: No internal mammary, thoracic inlet or axillary lymphadenopathy. RIGHT hilar soft tissue and infrahilar soft tissues similar to previous imaging. Lungs/Pleura: Mass along the RIGHT infrahilar region measures approximately 5.3 x 2.1 cm, when measured in a  similar fashion this was approximately 6.3 x 2.4 cm. Peribronchovascular ground-glass and septal thickening with decreased appearance as well since previous imaging decreased bulk of the mass at the level of the RIGHT mainstem bronchus on image 67 of series 3 as well with improved  aeration of lung parenchyma, at this level on the prior study this area measured as much as 3.8 x 5.0 cm, at most 3.6 x 4.5 cm but this includes some lung that has become aerated in the interval and or cavitation. No effusion. No consolidative changes. Airways are patent into the LEFT chest and some persistent narrowing of RIGHT lower lobe bronchi and peribronchovascular thickening in the RIGHT upper lobe. Musculoskeletal: See below for full musculoskeletal detail. CT ABDOMEN PELVIS FINDINGS Hepatobiliary: No focal, suspicious hepatic lesion. No pericholecystic stranding. No biliary duct dilation. Portal vein is patent. Pancreas: Normal, without mass, inflammation or ductal dilatation. Spleen: Spleen normal size and contour without focal lesion. Adrenals/Urinary Tract: Adrenal glands are normal. Symmetric renal enhancement. No hydronephrosis. Smooth contours of the urinary bladder. No suspicious renal lesion. Stomach/Bowel: Stomach, small bowel and appendix with normal CT appearance. No acute gastrointestinal process. Vascular/Lymphatic: Calcified atheromatous plaque of the abdominal aorta. No aneurysmal dilation. Smooth contour of the IVC. There is no gastrohepatic or hepatoduodenal ligament lymphadenopathy. No retroperitoneal or mesenteric lymphadenopathy. No pelvic sidewall lymphadenopathy. Reproductive: Unremarkable by CT. Other: No ascites.  No peritoneal nodularity. Musculoskeletal: No acute bone finding. No destructive bone process. Spinal degenerative changes. IMPRESSION: 1. Decreased bulk of the RIGHT hilar/infrahilar mass with improved aeration of lung parenchyma versus cavitation in the superior aspect of the bulky portion of  the mass. Attention on follow-up. 2. Improving appearance of presumed lymphangitic tumor in the RIGHT upper lobe. 3. No new or progressive findings. 4. Aortic atherosclerosis. Aortic Atherosclerosis (ICD10-I70.0). Electronically Signed   By: Zetta Bills M.D.   On: 02/13/2021 12:03     Assessment and plan- Patient is a 65 y.o. female with adenocarcinoma of the right lung clinical stage III acT3 N1 M0.  She is s/p concurrent chemoradiation with weekly CarboTaxol with scan showing partial response.  She is here for on treatment assessment prior to cycle 2 of maintenance durvalumab  Counts okay to proceed with cycle 2 of maintenance durvalumab today.  I will see her back in 2 weeks for cycle 3.  I will consider switching her to every 4-week regimen after 3 months of durvalumab and repeat scans.  Macrocytic anemia: Possibly secondary to chemotherapy.  TSH normal.  Ferritin iron studies B12 and folate to be checked today.  Folic acid levels were low and we will send her a prescription for folate 1 mg daily.  Cough: We will try gabapentin and see if it helps.  She will start at 300 mg at night and gradually increase it to 3 times daily.   Visit Diagnosis 1. Encounter for antineoplastic immunotherapy   2. Malignant neoplasm of lower lobe of right lung ([place])   3. Macrocytic anemia      Dr. Randa Evens, MD, MPH [facility] [phone] 03/13/2021 8:43 AM

## 2021-03-13 NOTE — Patient Instructions (Signed)
[facility]  Discharge Instructions: Thank you for choosing [facility] to provide your oncology and hematology care.  If you have a lab appointment with the [facility], please go directly to the [facility] and check in at the registration area.  Wear comfortable clothing and clothing appropriate for easy access to any Portacath or PICC line.   We strive to give you quality time with your provider. You may need to reschedule your appointment if you arrive late (15 or more minutes).  Arriving late affects you and other patients whose appointments are after yours.  Also, if you miss three or more appointments without notifying the office, you may be dismissed from the clinic at the provider's discretion.      For prescription refill requests, have your pharmacy contact our office and allow 72 hours for refills to be completed.    Today you received the following chemotherapy and/or immunotherapy agents - durvalumab      To help prevent nausea and vomiting after your treatment, we encourage you to take your nausea medication as directed.  BELOW ARE SYMPTOMS THAT SHOULD BE REPORTED IMMEDIATELY: *FEVER GREATER THAN 100.4 F (38 C) OR HIGHER *CHILLS OR SWEATING *NAUSEA AND VOMITING THAT IS NOT CONTROLLED WITH YOUR NAUSEA MEDICATION *UNUSUAL SHORTNESS OF BREATH *UNUSUAL BRUISING OR BLEEDING *URINARY PROBLEMS (pain or burning when urinating, or frequent urination) *BOWEL PROBLEMS (unusual diarrhea, constipation, pain near the anus) TENDERNESS IN MOUTH AND THROAT WITH OR WITHOUT PRESENCE OF ULCERS (sore throat, sores in mouth, or a toothache) UNUSUAL RASH, SWELLING OR PAIN  UNUSUAL VAGINAL DISCHARGE OR ITCHING   Items with * indicate a potential emergency and should be followed up as soon as possible or go to the Emergency Department if any problems should occur.  Please show the CHEMOTHERAPY ALERT CARD or IMMUNOTHERAPY ALERT CARD at  check-in to the Emergency Department and triage nurse.  Should you have questions after your visit or need to cancel or reschedule your appointment, please contact [facility]  [phone] and follow the prompts.  Office hours are 8:00 a.m. to 4:30 p.m. Monday - Friday. Please note that voicemails left after 4:00 p.m. may not be returned until the following business day.  We are closed weekends and major holidays. You have access to a nurse at all times for urgent questions. Please call the main number to the clinic [phone] and follow the prompts.  For any non-urgent questions, you may also contact your provider using MyChart. We now offer e-Visits for anyone 47 and older to request care online for non-urgent symptoms. For details visit [email or web address].   Also download the MyChart app! Go to the app store, search "MyChart", open the app, select [facility], and log in with your MyChart username and password.  Due to Covid, a mask is required upon entering the hospital/clinic. If you do not have a mask, one will be given to you upon arrival. For doctor visits, patients may have 1 support person aged 18 or older with them. For treatment visits, patients cannot have anyone with them due to current Covid guidelines and our immunocompromised population.   Durvalumab injection What is this medication? DURVALUMAB (dur VAL ue mab) is a monoclonal antibody. It is used to treat lungcancer. This medicine may be used for other purposes; ask your health care provider orpharmacist if you have questions. COMMON BRAND NAME(S): [email or web address] What should I tell my care team before I take this  medication? They need to know if you have any of these conditions: autoimmune diseases like Crohn's disease, ulcerative colitis, or lupus have had or planning to have an allogeneic stem cell transplant (uses someone else's stem cells) history of organ transplant history of radiation  to the chest nervous system problems like myasthenia gravis or Guillain-Barre syndrome an unusual or allergic reaction to durvalumab, other medicines, foods, dyes, or preservatives pregnant or trying to get pregnant breast-feeding How should I use this medication? This medicine is for infusion into a vein. It is given by a health careprofessional in a hospital or clinic setting. A special MedGuide will be given to you before each treatment. Be sure to readthis information carefully each time. Talk to your pediatrician regarding the use of this medicine in children.Special care may be needed. Overdosage: If you think you have taken too much of this medicine contact apoison control center or emergency room at once. NOTE: This medicine is only for you. Do not share this medicine with others. What if I miss a dose? It is important not to miss your dose. Call your doctor or health careprofessional if you are unable to keep an appointment. What may interact with this medication? Interactions have not been studied. This list may not describe all possible interactions. Give your health care provider a list of all the medicines, herbs, non-prescription drugs, or dietary supplements you use. Also tell them if you smoke, drink alcohol, or use illegaldrugs. Some items may interact with your medicine. What should I watch for while using this medication? This drug may make you feel generally unwell. Continue your course of treatmenteven though you feel ill unless your doctor tells you to stop. You may need blood work done while you are taking this medicine. Do not become pregnant while taking this medicine or for 3 months after stopping it. Women should inform their doctor if they wish to become pregnant or think they might be pregnant. There is a potential for serious side effects to an unborn child. Talk to your health care professional or pharmacist for more information. Do not breast-feed an infant while  taking this medicine orfor 3 months after stopping it. What side effects may I notice from receiving this medication? Side effects that you should report to your doctor or health care professionalas soon as possible: allergic reactions like skin rash, itching or hives, swelling of the face, lips, or tongue black, tarry stools bloody or watery diarrhea breathing problems change in emotions or moods change in sex drive changes in vision chest pain or chest tightness chills confusion cough facial flushing fever headache signs and symptoms of high blood sugar such as dizziness; dry mouth; dry skin; fruity breath; nausea; stomach pain; increased hunger or thirst; increased urination signs and symptoms of liver injury like dark yellow or brown urine; general ill feeling or flu-like symptoms; light-colored stools; loss of appetite; nausea; right upper belly pain; unusually weak or tired; yellowing of the eyes or skin stomach pain trouble passing urine or change in the amount of urine weight gain or weight loss Side effects that usually do not require medical attention (report these toyour doctor or health care professional if they continue or are bothersome): bone pain constipation loss of appetite muscle pain nausea swelling of the ankles, feet, hands tiredness This list may not describe all possible side effects. Call your doctor for medical advice about side effects. You may report side effects to FDA at1-800-FDA-1088. Where should I keep my medication? This  drug is given in a hospital or clinic and will not be stored at home. NOTE: This sheet is a summary. It may not cover all possible information. If you have questions about this medicine, talk to your doctor, pharmacist, orhealth care provider.  2022 Elsevier/Gold Standard (2019-09-27 13:01:29)

## 2021-03-15 ENCOUNTER — Encounter: Payer: Self-pay | Admitting: Oncology

## 2021-03-16 ENCOUNTER — Telehealth: Payer: Self-pay | Admitting: Oncology

## 2021-03-16 NOTE — Telephone Encounter (Signed)
Pt called to request her Chemo treatment be moved to 8/24. She stated she wants to go out of town on 8/25 and 26. Pt was informed that her clinical team needed to be notified and someone would be in contact with her.

## 2021-03-16 NOTE — Telephone Encounter (Signed)
Please look into it

## 2021-03-18 NOTE — Telephone Encounter (Signed)
Spoke with patient--she is no longer going out of town. No changes made to appointments.

## 2021-03-27 ENCOUNTER — Inpatient Hospital Stay (HOSPITAL_BASED_OUTPATIENT_CLINIC_OR_DEPARTMENT_OTHER): Payer: BC Managed Care – PPO | Admitting: Nurse Practitioner

## 2021-03-27 ENCOUNTER — Inpatient Hospital Stay: Payer: BC Managed Care – PPO

## 2021-03-27 ENCOUNTER — Ambulatory Visit
Admission: RE | Admit: 2021-03-27 | Discharge: 2021-03-27 | Disposition: A | Discharge status: Home / Self Care | Payer: BC Managed Care – PPO | Source: Ambulatory Visit | Attending: Nurse Practitioner | Admitting: Nurse Practitioner

## 2021-03-27 ENCOUNTER — Encounter: Payer: Self-pay | Admitting: Nurse Practitioner

## 2021-03-27 VITALS — BP 144/69 | HR 96 | Temp 97.2°F | Resp 16 | Wt 144.0 lb

## 2021-03-27 DIAGNOSIS — C3431 Malignant neoplasm of lower lobe, right bronchus or lung: Secondary | ICD-10-CM | POA: Insufficient documentation

## 2021-03-27 DIAGNOSIS — J984 Other disorders of lung: Secondary | ICD-10-CM | POA: Diagnosis present

## 2021-03-27 DIAGNOSIS — Z5112 Encounter for antineoplastic immunotherapy: Secondary | ICD-10-CM | POA: Diagnosis not present

## 2021-03-27 LAB — COMPREHENSIVE METABOLIC PANEL
ALT: 13 U/L (ref 0–44)
AST: 18 U/L (ref 15–41)
Albumin: 3.6 g/dL (ref 3.5–5.0)
Alkaline Phosphatase: 68 U/L (ref 38–126)
Anion gap: 10 (ref 5–15)
BUN: 7 mg/dL — ABNORMAL LOW (ref 8–23)
CO2: 23 mmol/L (ref 22–32)
Calcium: 9.1 mg/dL (ref 8.9–10.3)
Chloride: 98 mmol/L (ref 98–111)
Creatinine, Ser: 0.57 mg/dL (ref 0.44–1.00)
GFR, Estimated: >60 mL/min (ref >60)
Glucose, Bld: 121 mg/dL — ABNORMAL HIGH (ref 70–99)
Potassium: 3.6 mmol/L (ref 3.5–5.1)
Sodium: 131 mmol/L — ABNORMAL LOW (ref 135–145)
Total Bilirubin: 0.7 mg/dL (ref 0.3–1.2)
Total Protein: 7.6 g/dL (ref 6.5–8.1)

## 2021-03-27 LAB — CBC WITH DIFFERENTIAL/PLATELET
Abs Immature Granulocytes: 0.01 10*3/uL (ref 0.00–0.07)
Basophils Absolute: 0 10*3/uL (ref 0.0–0.1)
Basophils Relative: 0 %
Eosinophils Absolute: 0 10*3/uL (ref 0.0–0.5)
Eosinophils Relative: 1 %
HCT: 29.8 % — ABNORMAL LOW (ref 36.0–46.0)
Hemoglobin: 10 g/dL — ABNORMAL LOW (ref 12.0–15.0)
Immature Granulocytes: 0 %
Lymphocytes Relative: 20 %
Lymphs Abs: 0.9 10*3/uL (ref 0.7–4.0)
MCH: 37.6 pg — ABNORMAL HIGH (ref 26.0–34.0)
MCHC: 33.6 g/dL (ref 30.0–36.0)
MCV: 112 fL — ABNORMAL HIGH (ref 80.0–100.0)
Monocytes Absolute: 0.6 10*3/uL (ref 0.1–1.0)
Monocytes Relative: 13 %
Neutro Abs: 3 10*3/uL (ref 1.7–7.7)
Neutrophils Relative %: 66 %
Platelets: 261 10*3/uL (ref 150–400)
RBC: 2.66 MIL/uL — ABNORMAL LOW (ref 3.87–5.11)
RDW: 15.1 % (ref 11.5–15.5)
WBC: 4.6 10*3/uL (ref 4.0–10.5)
nRBC: 0 % (ref 0.0–0.2)

## 2021-03-27 MED ORDER — HEPARIN SOD (PORK) LOCK FLUSH 100 UNIT/ML IV SOLN
500.0000 [IU] | Freq: Once | INTRAVENOUS | Status: AC | PRN
  Filled 2021-03-27: qty 5

## 2021-03-27 MED ORDER — SODIUM CHLORIDE 0.9% FLUSH
10.0000 mL | Freq: Once | INTRAVENOUS | Status: AC
  Administered 2021-03-27: 10 mL via INTRAVENOUS
  Filled 2021-03-27: qty 10

## 2021-03-27 MED ORDER — SODIUM CHLORIDE 0.9% FLUSH
10.0000 mL | INTRAVENOUS | Status: DC | PRN
  Filled 2021-03-27: qty 10

## 2021-03-27 MED ORDER — HEPARIN SOD (PORK) LOCK FLUSH 100 UNIT/ML IV SOLN
INTRAVENOUS | Status: AC
  Administered 2021-03-27: 500 [IU]
  Filled 2021-03-27: qty 5

## 2021-03-27 MED ORDER — SODIUM CHLORIDE 0.9 % IV SOLN
Freq: Once | INTRAVENOUS | Status: AC
  Filled 2021-03-27: qty 250

## 2021-03-27 MED ORDER — SODIUM CHLORIDE 0.9 % IV SOLN
10.0000 mg/kg | Freq: Once | INTRAVENOUS | Status: AC
  Administered 2021-03-27: 620 mg via INTRAVENOUS
  Filled 2021-03-27: qty 10

## 2021-03-27 NOTE — Patient Instructions (Signed)
[facility] ONCOLOGY  Discharge Instructions: Thank you for choosing [facility] to provide your oncology and hematology care.  If you have a lab appointment with the [facility], please go directly to the [facility] and check in at the registration area.  Wear comfortable clothing and clothing appropriate for easy access to any Portacath or PICC line.   We strive to give you quality time with your provider. You may need to reschedule your appointment if you arrive late (15 or more minutes).  Arriving late affects you and other patients whose appointments are after yours.  Also, if you miss three or more appointments without notifying the office, you may be dismissed from the clinic at the provider's discretion.      For prescription refill requests, have your pharmacy contact our office and allow 72 hours for refills to be completed.    Today you received the following chemotherapy and/or immunotherapy agents - durvalumab      To help prevent nausea and vomiting after your treatment, we encourage you to take your nausea medication as directed.  BELOW ARE SYMPTOMS THAT SHOULD BE REPORTED IMMEDIATELY: *FEVER GREATER THAN 100.4 F (38 C) OR HIGHER *CHILLS OR SWEATING *NAUSEA AND VOMITING THAT IS NOT CONTROLLED WITH YOUR NAUSEA MEDICATION *UNUSUAL SHORTNESS OF BREATH *UNUSUAL BRUISING OR BLEEDING *URINARY PROBLEMS (pain or burning when urinating, or frequent urination) *BOWEL PROBLEMS (unusual diarrhea, constipation, pain near the anus) TENDERNESS IN MOUTH AND THROAT WITH OR WITHOUT PRESENCE OF ULCERS (sore throat, sores in mouth, or a toothache) UNUSUAL RASH, SWELLING OR PAIN  UNUSUAL VAGINAL DISCHARGE OR ITCHING   Items with * indicate a potential emergency and should be followed up as soon as possible or go to the Emergency Department if any problems should occur.  Please show the CHEMOTHERAPY ALERT CARD or IMMUNOTHERAPY ALERT CARD at  check-in to the Emergency Department and triage nurse.  Should you have questions after your visit or need to cancel or reschedule your appointment, please contact [facility]  [phone] and follow the prompts.  Office hours are 8:00 a.m. to 4:30 p.m. Monday - Friday. Please note that voicemails left after 4:00 p.m. may not be returned until the following business day.  We are closed weekends and major holidays. You have access to a nurse at all times for urgent questions. Please call the main number to the clinic [phone] and follow the prompts.  For any non-urgent questions, you may also contact your provider using MyChart. We now offer e-Visits for anyone 91 and older to request care online for non-urgent symptoms. For details visit [email or web address].   Also download the MyChart app! Go to the app store, search "MyChart", open the app, select [facility], and log in with your MyChart username and password.  Due to Covid, a mask is required upon entering the hospital/clinic. If you do not have a mask, one will be given to you upon arrival. For doctor visits, patients may have 1 support person aged 42 or older with them. For treatment visits, patients cannot have anyone with them due to current Covid guidelines and our immunocompromised population.   Durvalumab injection What is this medication? DURVALUMAB (dur VAL ue mab) is a monoclonal antibody. It is used to treat lungcancer. This medicine may be used for other purposes; ask your health care provider orpharmacist if you have questions. COMMON BRAND NAME(S): IMFINZI What should I tell my care team before I take this  medication? They need to know if you have any of these conditions: autoimmune diseases like Crohn's disease, ulcerative colitis, or lupus have had or planning to have an allogeneic stem cell transplant (uses someone else's stem cells) history of organ transplant history of radiation  to the chest nervous system problems like myasthenia gravis or Guillain-Barre syndrome an unusual or allergic reaction to durvalumab, other medicines, foods, dyes, or preservatives pregnant or trying to get pregnant breast-feeding How should I use this medication? This medicine is for infusion into a vein. It is given by a health careprofessional in a hospital or clinic setting. A special MedGuide will be given to you before each treatment. Be sure to readthis information carefully each time. Talk to your pediatrician regarding the use of this medicine in children.Special care may be needed. Overdosage: If you think you have taken too much of this medicine contact apoison control center or emergency room at once. NOTE: This medicine is only for you. Do not share this medicine with others. What if I miss a dose? It is important not to miss your dose. Call your doctor or health careprofessional if you are unable to keep an appointment. What may interact with this medication? Interactions have not been studied. This list may not describe all possible interactions. Give your health care provider a list of all the medicines, herbs, non-prescription drugs, or dietary supplements you use. Also tell them if you smoke, drink alcohol, or use illegaldrugs. Some items may interact with your medicine. What should I watch for while using this medication? This drug may make you feel generally unwell. Continue your course of treatmenteven though you feel ill unless your doctor tells you to stop. You may need blood work done while you are taking this medicine. Do not become pregnant while taking this medicine or for 3 months after stopping it. Women should inform their doctor if they wish to become pregnant or think they might be pregnant. There is a potential for serious side effects to an unborn child. Talk to your health care professional or pharmacist for more information. Do not breast-feed an infant while  taking this medicine orfor 3 months after stopping it. What side effects may I notice from receiving this medication? Side effects that you should report to your doctor or health care professionalas soon as possible: allergic reactions like skin rash, itching or hives, swelling of the face, lips, or tongue black, tarry stools bloody or watery diarrhea breathing problems change in emotions or moods change in sex drive changes in vision chest pain or chest tightness chills confusion cough facial flushing fever headache signs and symptoms of high blood sugar such as dizziness; dry mouth; dry skin; fruity breath; nausea; stomach pain; increased hunger or thirst; increased urination signs and symptoms of liver injury like dark yellow or brown urine; general ill feeling or flu-like symptoms; light-colored stools; loss of appetite; nausea; right upper belly pain; unusually weak or tired; yellowing of the eyes or skin stomach pain trouble passing urine or change in the amount of urine weight gain or weight loss Side effects that usually do not require medical attention (report these toyour doctor or health care professional if they continue or are bothersome): bone pain constipation loss of appetite muscle pain nausea swelling of the ankles, feet, hands tiredness This list may not describe all possible side effects. Call your doctor for medical advice about side effects. You may report side effects to FDA at1-800-FDA-1088. Where should I keep my medication? This  drug is given in a hospital or clinic and will not be stored at home. NOTE: This sheet is a summary. It may not cover all possible information. If you have questions about this medicine, talk to your doctor, pharmacist, orhealth care provider.  2022 Elsevier/Gold Standard (2019-09-27 13:01:29)

## 2021-03-27 NOTE — Progress Notes (Signed)
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  She quit smoking about 26 years ago.  Other medical problems include hypertension and hyponatremia.She has been treated for iron of possible pneumonia for the last 1 year.  She has a history of intermittent asthma for which she uses Advair.  She was seen by Dr. And underwent CT chest which showed dense infiltrate/solid mass in the right lower lobe with contiguous airspace opacity in the right upper lobe.  Findings could be secondary to pneumonia but concerning for bronchogenic carcinoma.  Patient then had a PET CT scan which showed consolidation in the medial aspect of the right lower lobe measuring 3.4 x 4 cm with an SUV of 11.6.  Hypermetabolic masslike thickening in the right hilum measuring 2.2 cm with intense hypermetabolic activity.  Groundglass airspace consolidation in the posterior aspect of the right upper lobe with an SUV of 11.4.  The right upper lobe opacity was nonspecific and differentials include pulmonary infection versus lymphangitic spread of carcinoma.  CT chest showed showed masslike area of distortion involving right lower lobe middle lobe as well as right upper lobe.  Generalized right hilar masslike appearance concerning for nodal involvement.   Patient underwent bronchoscopy and biopsies.Right lower lobe was concerning for adenocarcinoma.  Lymph node station 11  R, 4R, 7 were negative for malignancy.  No malignant cells identified in the right upper lobe.  Case was discussed at tumor board and given the hypermetabolism in the right upper lobe as well as hilum involvement cannot be ruled out despite negative biopsies.  Patient completed concurrent chemoradiation with weekly CarboTaxol in July 2022.  Scan showed partial response.  Maintenance durvalumab started on 02/27/2021    Interval history- Patient returns to clinic for labs and consideration of cycle 3 of maintenance imfinzi. She feels cough has worsened somewhat. Described as dry, worse at night. Unsure if she has been taking gabapentin. Says she coughs so hard at times it makes her knees hurt. No fevers or chills. Endorses wheezing. Worried she has infection. Has been taking cough syrup. Otherwise feels well.    ECOG PS- 1 Pain scale- 0   Review of systems- Review of Systems  Constitutional:  Negative for chills, fever, malaise/fatigue and weight loss.  HENT:  Negative for hearing loss, nosebleeds, sore throat and tinnitus.   Eyes:  Negative for blurred vision and double vision.  Respiratory:  Positive for cough and wheezing. Negative for hemoptysis, sputum production and shortness of breath.   Cardiovascular:  Negative for chest pain, palpitations and leg swelling.  Gastrointestinal:  Negative for abdominal pain, blood in stool, constipation, diarrhea, melena, nausea and vomiting.  Genitourinary:  Negative for dysuria and urgency.  Musculoskeletal:  Negative for back pain, falls, joint pain and myalgias.  Skin:  Negative for itching and rash.  Neurological:  Negative for dizziness, tingling, sensory change, loss of consciousness, weakness and headaches.  Endo/Heme/Allergies:  Negative for environmental allergies. Does not bruise/bleed  easily.  Psychiatric/Behavioral:  Negative for depression. The patient is nervous/anxious. The patient does not have insomnia.      No Known Allergies   Past  Medical History:  Diagnosis Date   Anxiety    Asthma    Cough    Dyspnea    with exertion   GERD (gastroesophageal reflux disease)    Hypertension      Past Surgical History:  Procedure Laterality Date   BUNIONECTOMY Bilateral    COLONOSCOPY     PORTA CATH INSERTION N/A 12/22/2020   Procedure: PORTA CATH INSERTION;  Surgeon: Algernon Huxley, MD;  Location: [facility];  Service: Cardiovascular;  Laterality: N/A;   VIDEO BRONCHOSCOPY WITH ENDOBRONCHIAL NAVIGATION N/A 11/07/2020   Procedure: VIDEO BRONCHOSCOPY WITH ENDOBRONCHIAL NAVIGATION;  Surgeon: Ottie Glazier, MD;  Location: [facility];  Service: Thoracic;  Laterality: N/A;   VIDEO BRONCHOSCOPY WITH ENDOBRONCHIAL ULTRASOUND N/A 11/07/2020   Procedure: VIDEO BRONCHOSCOPY WITH ENDOBRONCHIAL ULTRASOUND;  Surgeon: Ottie Glazier, MD;  Location: [facility];  Service: Thoracic;  Laterality: N/A;    Social History   Socioeconomic History   Marital status: Married    Spouse name: Not on file   Number of children: Not on file   Years of education: Not on file   Highest education level: Not on file  Occupational History   Not on file  Tobacco Use   Smoking status: Former    Packs/day: 1.00    Years: 15.00    Pack years: 15.00    Types: Cigarettes    Quit date: 11/07/1994    Years since quitting: 26.4   Smokeless tobacco: Never  Vaping Use   Vaping Use: Never used  Substance and Sexual Activity   Alcohol use: Yes    Comment:  wine daily   Drug use: Never   Sexual activity: Not on file  Other Topics Concern   Not on file  Social History Narrative   Not on file   Social Determinants of Health   Financial Resource Strain: Not on file  Food Insecurity: Not on file  Transportation Needs: Not on file  Physical Activity: Not on file  Stress: Not on file  Social Connections: Not on file  Intimate Partner Violence: Not on file    No family history on file.   Current Outpatient Medications:    acetaminophen (TYLENOL)  500 MG tablet, Take 500 mg by mouth every 6 (six) hours as needed., Disp: , Rfl:    ADVAIR DISKUS 250-50 MCG/DOSE AEPB, Inhale 1 puff into the lungs as needed., Disp: , Rfl:    ALPRAZolam (XANAX) 0.5 MG tablet, Take 0.5 mg by mouth at bedtime as needed for sleep., Disp: , Rfl:    amLODipine (NORVASC) 5 MG tablet, Take 5 mg by mouth at bedtime., Disp: , Rfl:    chlorpheniramine-HYDROcodone (TUSSIONEX) 10-8 MG/5ML SUER, Take 5 mLs by mouth every 12 (twelve) hours as needed for cough., Disp: 140 mL, Rfl: 0   Cholecalciferol 50 MCG (2000 UT) CAPS, Take 2,000 Units by mouth daily., Disp: , Rfl:    docusate sodium (COLACE) 100 MG capsule, Take 100 mg by mouth daily., Disp: , Rfl:    esomeprazole (NEXIUM) 20 MG capsule, Take 20 mg by mouth as needed., Disp: , Rfl:    fluticasone (FLONASE) 50 MCG/ACT nasal spray, Place 2 sprays into both nostrils daily as needed for rhinitis., Disp: , Rfl:    folic acid (FOLVITE) 1 MG tablet, Take 1 tablet (1 mg total)  by mouth daily., Disp: 30 tablet, Rfl: 2   gabapentin (NEURONTIN) 300 MG capsule, Take 1 capsule (300 mg total) by mouth 3 (three) times daily. Start with 1 capsule nightly for 1 week, then 2 capsules -1 in am, 1 at night for 1 week, then 1 tablet three times a day, Disp: 69 capsule, Rfl: 0   lansoprazole (PREVACID) 30 MG capsule, TAKE 1 CAPSULE (30 MG TOTAL) BY MOUTH DAILY AT 12 NOON., Disp: 30 capsule, Rfl: 2   metoprolol succinate (TOPROL-XL) 50 MG 24 hr tablet, Take 50 mg by mouth every morning., Disp: , Rfl:    olmesartan (BENICAR) 40 MG tablet, Take 40 mg by mouth at bedtime., Disp: , Rfl:    traMADol (ULTRAM) 50 MG tablet, Take 1 tablet (50 mg total) by mouth every 6 (six) hours as needed. (Patient not taking: Reported on 03/13/2021), Disp: 30 tablet, Rfl: 0   zolpidem (AMBIEN) 10 MG tablet, Take 10 mg by mouth at bedtime as needed for sleep., Disp: , Rfl:  No current facility-administered medications for this visit.  Facility-Administered Medications  Ordered in Other Visits:    sodium chloride flush (NS) 0.9 % injection 10 mL, 10 mL, Intravenous, Once, Sindy Guadeloupe, MD   sodium chloride flush (NS) 0.9 % injection 10 mL, 10 mL, Intravenous, PRN, Sindy Guadeloupe, MD, 10 mL at 01/26/21 0807  Physical exam:  Vitals:   03/27/21 0919  BP: (!) 144/69  Pulse: 96  Resp: 16  Temp: (!) 97.2 F (36.2 C)  SpO2: 99%  Weight: 144 lb (65.3 kg)   Physical Exam Constitutional:      General: She is not in acute distress.    Appearance: She is well-developed. She is not ill-appearing.  HENT:     Head: Atraumatic.     Nose: Nose normal.     Mouth/Throat:     Pharynx: No oropharyngeal exudate.  Eyes:     General: No scleral icterus.    Conjunctiva/sclera: Conjunctivae normal.  Cardiovascular:     Rate and Rhythm: Normal rate and regular rhythm.  Pulmonary:     Effort: Pulmonary effort is normal.     Breath sounds: No decreased air movement. Examination of the right-upper field reveals wheezing. Examination of the left-upper field reveals wheezing. Wheezing present. No rhonchi.  Abdominal:     General: Bowel sounds are normal. There is no distension.     Palpations: Abdomen is soft.     Tenderness: There is no abdominal tenderness.  Musculoskeletal:        General: No tenderness or deformity. Normal range of motion.     Cervical back: Normal range of motion and neck supple.  Skin:    General: Skin is warm and dry.     Coloration: Skin is not pale.     Findings: No rash.  Neurological:     General: No focal deficit present.     Mental Status: She is alert and oriented to person, place, and time.  Psychiatric:        Mood and Affect: Mood normal.        Behavior: Behavior normal.     CMP Latest Ref Rng & Units 03/27/2021  Glucose 70 - 99 mg/dL 121(H)  BUN 8 - 23 mg/dL 7(L)  Creatinine 0.44 - 1.00 mg/dL 0.57  Sodium 135 - 145 mmol/L 131(L)  Potassium 3.5 - 5.1 mmol/L 3.6  Chloride 98 - 111 mmol/L 98  CO2 22 - 32 mmol/L 23   Calcium  8.9 - 10.3 mg/dL 9.1  Total Protein 6.5 - 8.1 g/dL 7.6  Total Bilirubin 0.3 - 1.2 mg/dL 0.7  Alkaline Phos 38 - 126 U/L 68  AST 15 - 41 U/L 18  ALT 0 - 44 U/L 13   CBC Latest Ref Rng & Units 03/27/2021  WBC 4.0 - 10.5 K/uL 4.6  Hemoglobin 12.0 - 15.0 g/dL 10.0(L)  Hematocrit 36.0 - 46.0 % 29.8(L)  Platelets 150 - 400 K/uL 261    No images are attached to the encounter.  No results found.   Assessment and plan- Patient is a 65 y.o. female with adenocarcinoma of the right lung clinical stage III acT3 N1 M0.  She is s/p concurrent chemoradiation with weekly CarboTaxol with scan showing partial response.  She is here for on treatment assessment prior to cycle 3 of maintenance durvalumab  Labs today reviewed and acceptable to proceed with treatment. Return to clinic in 2 weeks for labs and consideration of cycle 4 of durvalumab. Consider switching her to every 4 week regimen after 3 months of durvalumab and repeat imaging.   Macrocytic anemia: Folate low at 3.6 and she has started folate 1 mg daily. b12 normal. TSH previously normal. Monitor.   Cough: Will get chest xray to evaluate for infection vs pneumonitis vs others. Encouraged her to try gabapentin as prescribed.    Visit Diagnosis 1. Encounter for antineoplastic immunotherapy   2. Malignant neoplasm of lower lobe of right lung ([place])     Beckey Rutter, DNP, AGNP-C [facility] [phone] (clinic) 03/27/2021 9:39 AM

## 2021-03-27 NOTE — Progress Notes (Signed)
Pt states she has been coughing going on longer than [redacted] weeks along with some wheezing. Als, is concerned about small red spots going up her rt arm she believes it comes from taking aleve ut is not sure. Not itchy or painful.

## 2021-03-30 ENCOUNTER — Telehealth: Payer: Self-pay | Admitting: *Deleted

## 2021-03-30 ENCOUNTER — Other Ambulatory Visit: Payer: Self-pay | Admitting: *Deleted

## 2021-03-30 DIAGNOSIS — J189 Pneumonia, unspecified organism: Secondary | ICD-10-CM

## 2021-03-30 MED ORDER — HYDROCODONE BIT-HOMATROP MBR 5-1.5 MG/5ML PO SOLN: 5.0000 mL | Freq: Four times a day (QID) | ORAL | 0 refills | Status: DC | PRN

## 2021-03-30 NOTE — Telephone Encounter (Signed)
Lets get ct chest without contrast asap. Start her on hydromet if she is not on it

## 2021-03-30 NOTE — Telephone Encounter (Signed)
Orders placed for CT chest and hydromet. Will call pt once scheduled for CT.

## 2021-03-30 NOTE — Telephone Encounter (Signed)
Spoke with patient to review chest xray results from last Friday 8/26. Pt stated that she continues to have persistent cough with audible wheezing since started immunotherapy. Coughing has been keeping her up at night and would like to know what can be done to help with her cough.

## 2021-03-31 ENCOUNTER — Other Ambulatory Visit: Payer: Self-pay

## 2021-03-31 ENCOUNTER — Ambulatory Visit
Admission: RE | Admit: 2021-03-31 | Discharge: 2021-03-31 | Disposition: A | Discharge status: Home / Self Care | Payer: BC Managed Care – PPO | Source: Ambulatory Visit | Attending: Oncology | Admitting: Oncology

## 2021-03-31 ENCOUNTER — Telehealth: Payer: Self-pay | Admitting: *Deleted

## 2021-03-31 DIAGNOSIS — J189 Pneumonia, unspecified organism: Secondary | ICD-10-CM | POA: Diagnosis not present

## 2021-03-31 DIAGNOSIS — J984 Other disorders of lung: Secondary | ICD-10-CM

## 2021-03-31 MED ORDER — PREDNISONE 10 MG PO TABS: ORAL_TABLET | ORAL | 0 refills | Status: DC

## 2021-03-31 MED ORDER — AMOXICILLIN-POT CLAVULANATE 875-125 MG PO TABS: 1.0000 | ORAL_TABLET | Freq: Two times a day (BID) | ORAL | 0 refills | Status: DC

## 2021-03-31 NOTE — Telephone Encounter (Signed)
Reviewed CT scan results with patient per Dr. Janese Banks. Informed that will need oral antibiotics and steroids that will be sent into her pharmacy. Pt verbalized understanding.

## 2021-04-10 ENCOUNTER — Other Ambulatory Visit: Payer: Self-pay

## 2021-04-10 ENCOUNTER — Encounter: Payer: Self-pay | Admitting: Oncology

## 2021-04-10 ENCOUNTER — Inpatient Hospital Stay: Payer: BC Managed Care – PPO

## 2021-04-10 ENCOUNTER — Inpatient Hospital Stay: Payer: BC Managed Care – PPO | Attending: Oncology

## 2021-04-10 ENCOUNTER — Inpatient Hospital Stay (HOSPITAL_BASED_OUTPATIENT_CLINIC_OR_DEPARTMENT_OTHER): Payer: BC Managed Care – PPO | Admitting: Oncology

## 2021-04-10 VITALS — BP 153/64 | HR 104 | Temp 98.0°F | Resp 20 | Wt 145.0 lb

## 2021-04-10 DIAGNOSIS — C3431 Malignant neoplasm of lower lobe, right bronchus or lung: Secondary | ICD-10-CM | POA: Insufficient documentation

## 2021-04-10 DIAGNOSIS — Z5112 Encounter for antineoplastic immunotherapy: Secondary | ICD-10-CM

## 2021-04-10 DIAGNOSIS — Z79899 Other long term (current) drug therapy: Secondary | ICD-10-CM | POA: Insufficient documentation

## 2021-04-10 LAB — CBC WITH DIFFERENTIAL/PLATELET
Abs Immature Granulocytes: 0.08 10*3/uL — ABNORMAL HIGH (ref 0.00–0.07)
Basophils Absolute: 0 10*3/uL (ref 0.0–0.1)
Basophils Relative: 1 %
Eosinophils Absolute: 0.1 10*3/uL (ref 0.0–0.5)
Eosinophils Relative: 1 %
HCT: 31.6 % — ABNORMAL LOW (ref 36.0–46.0)
Hemoglobin: 10.6 g/dL — ABNORMAL LOW (ref 12.0–15.0)
Immature Granulocytes: 1 %
Lymphocytes Relative: 14 %
Lymphs Abs: 1 10*3/uL (ref 0.7–4.0)
MCH: 36.9 pg — ABNORMAL HIGH (ref 26.0–34.0)
MCHC: 33.5 g/dL (ref 30.0–36.0)
MCV: 110.1 fL — ABNORMAL HIGH (ref 80.0–100.0)
Monocytes Absolute: 0.6 10*3/uL (ref 0.1–1.0)
Monocytes Relative: 8 %
Neutro Abs: 5.7 10*3/uL (ref 1.7–7.7)
Neutrophils Relative %: 75 %
Platelets: 351 10*3/uL (ref 150–400)
RBC: 2.87 MIL/uL — ABNORMAL LOW (ref 3.87–5.11)
RDW: 13.8 % (ref 11.5–15.5)
WBC: 7.6 10*3/uL (ref 4.0–10.5)
nRBC: 0 % (ref 0.0–0.2)

## 2021-04-10 LAB — COMPREHENSIVE METABOLIC PANEL
ALT: 24 U/L (ref 0–44)
AST: 28 U/L (ref 15–41)
Albumin: 3.8 g/dL (ref 3.5–5.0)
Alkaline Phosphatase: 61 U/L (ref 38–126)
Anion gap: 10 (ref 5–15)
BUN: 10 mg/dL (ref 8–23)
CO2: 26 mmol/L (ref 22–32)
Calcium: 9.4 mg/dL (ref 8.9–10.3)
Chloride: 100 mmol/L (ref 98–111)
Creatinine, Ser: 0.63 mg/dL (ref 0.44–1.00)
GFR, Estimated: >60 mL/min (ref >60)
Glucose, Bld: 125 mg/dL — ABNORMAL HIGH (ref 70–99)
Potassium: 3.4 mmol/L — ABNORMAL LOW (ref 3.5–5.1)
Sodium: 136 mmol/L (ref 135–145)
Total Bilirubin: 0.5 mg/dL (ref 0.3–1.2)
Total Protein: 7.5 g/dL (ref 6.5–8.1)

## 2021-04-10 MED ORDER — HEPARIN SOD (PORK) LOCK FLUSH 100 UNIT/ML IV SOLN
INTRAVENOUS | Status: AC
  Filled 2021-04-10: qty 5

## 2021-04-10 MED ORDER — SODIUM CHLORIDE 0.9 % IV SOLN
10.0000 mg/kg | Freq: Once | INTRAVENOUS | Status: AC
  Administered 2021-04-10: 620 mg via INTRAVENOUS
  Filled 2021-04-10: qty 10

## 2021-04-10 MED ORDER — SODIUM CHLORIDE 0.9 % IV SOLN
Freq: Once | INTRAVENOUS | Status: AC
Start: 2021-04-10 — End: 2021-04-10
  Filled 2021-04-10: qty 250

## 2021-04-10 MED ORDER — HEPARIN SOD (PORK) LOCK FLUSH 100 UNIT/ML IV SOLN
500.0000 [IU] | Freq: Once | INTRAVENOUS | Status: AC | PRN
  Administered 2021-04-10: 500 [IU]
  Filled 2021-04-10: qty 5

## 2021-04-10 NOTE — Progress Notes (Signed)
HR 104 ok per MD

## 2021-04-10 NOTE — Patient Instructions (Signed)
[facility] ONCOLOGY  Discharge Instructions: Thank you for choosing [facility] to provide your oncology and hematology care.  If you have a lab appointment with the [facility], please go directly to the [facility] and check in at the registration area.  Wear comfortable clothing and clothing appropriate for easy access to any Portacath or PICC line.   We strive to give you quality time with your provider. You may need to reschedule your appointment if you arrive late (15 or more minutes).  Arriving late affects you and other patients whose appointments are after yours.  Also, if you miss three or more appointments without notifying the office, you may be dismissed from the clinic at the provider's discretion.      For prescription refill requests, have your pharmacy contact our office and allow 72 hours for refills to be completed.    Today you received the following chemotherapy and/or immunotherapy agents: Imfinzi   To help prevent nausea and vomiting after your treatment, we encourage you to take your nausea medication as directed.  BELOW ARE SYMPTOMS THAT SHOULD BE REPORTED IMMEDIATELY: *FEVER GREATER THAN 100.4 F (38 C) OR HIGHER *CHILLS OR SWEATING *NAUSEA AND VOMITING THAT IS NOT CONTROLLED WITH YOUR NAUSEA MEDICATION *UNUSUAL SHORTNESS OF BREATH *UNUSUAL BRUISING OR BLEEDING *URINARY PROBLEMS (pain or burning when urinating, or frequent urination) *BOWEL PROBLEMS (unusual diarrhea, constipation, pain near the anus) TENDERNESS IN MOUTH AND THROAT WITH OR WITHOUT PRESENCE OF ULCERS (sore throat, sores in mouth, or a toothache) UNUSUAL RASH, SWELLING OR PAIN  UNUSUAL VAGINAL DISCHARGE OR ITCHING   Items with * indicate a potential emergency and should be followed up as soon as possible or go to the Emergency Department if any problems should occur.  Please show the CHEMOTHERAPY ALERT CARD or IMMUNOTHERAPY ALERT CARD at check-in to  the Emergency Department and triage nurse.  Should you have questions after your visit or need to cancel or reschedule your appointment, please contact [facility]  [phone] and follow the prompts.  Office hours are 8:00 a.m. to 4:30 p.m. Monday - Friday. Please note that voicemails left after 4:00 p.m. may not be returned until the following business day.  We are closed weekends and major holidays. You have access to a nurse at all times for urgent questions. Please call the main number to the clinic [phone] and follow the prompts.  For any non-urgent questions, you may also contact your provider using MyChart. We now offer e-Visits for anyone 27 and older to request care online for non-urgent symptoms. For details visit [email or web address].   Also download the MyChart app! Go to the app store, search "MyChart", open the app, select [facility], and log in with your MyChart username and password.  Due to Covid, a mask is required upon entering the hospital/clinic. If you do not have a mask, one will be given to you upon arrival. For doctor visits, patients may have 1 support person aged 56 or older with them. For treatment visits, patients cannot have anyone with them due to current Covid guidelines and our immunocompromised population.

## 2021-04-10 NOTE — Progress Notes (Signed)
Patient have questions about her cough. Patient states it hard to get drinks down.

## 2021-04-12 ENCOUNTER — Encounter: Payer: Self-pay | Admitting: Oncology

## 2021-04-12 NOTE — Progress Notes (Signed)
Hematology/Oncology Consult note 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  She quit smoking about 26 years ago.  Other medical problems include hypertension and hyponatremia.She has been treated for iron of possible pneumonia for the last 1 year.  She has a history of intermittent asthma for which she uses Advair.  She was seen by Dr. And underwent CT chest which showed dense infiltrate/solid mass in the right lower lobe with contiguous airspace opacity in the right upper lobe.  Findings could be secondary to pneumonia but concerning for bronchogenic carcinoma.  Patient then had a PET CT scan which showed consolidation in the medial aspect of the right lower lobe measuring 3.4 x 4 cm with an SUV of 11.6.  Hypermetabolic masslike thickening in the right hilum measuring 2.2 cm with intense hypermetabolic activity.  Groundglass airspace consolidation in the posterior aspect of the right upper lobe with an SUV of 11.4.  The right upper lobe opacity was nonspecific and differentials include pulmonary infection versus lymphangitic spread of carcinoma.  CT chest showed showed masslike area of distortion involving right lower lobe middle lobe as well as right upper lobe.  Generalized right hilar masslike appearance concerning for nodal involvement.   Patient underwent bronchoscopy and biopsies.Right lower lobe was concerning for adenocarcinoma.  Lymph node station 11  R, 4R, 7 were negative for malignancy.  No malignant cells identified in the right upper lobe.  Case was discussed at tumor board and given the hypermetabolism in the right upper lobe as well as hilum involvement cannot be ruled out despite negative biopsies.  Patient completed concurrent chemoradiation with weekly CarboTaxol in July 2022.  Scan showed partial response.  Maintenance durvalumab started on 02/27/2021    Interval history-patient received a course of antibiotics for possible pneumonia.  She is also on cough medications which is overall helping her and patient reported that her cough has improved.  Denies any fever or shortness of breath.  Knee pain has also improved.  ECOG PS- 1 Pain scale- 0   Review of systems- Review of Systems  Constitutional:  Negative for chills, fever, malaise/fatigue and weight loss.  HENT:  Negative for congestion, ear discharge and nosebleeds.   Eyes:  Negative for blurred vision.  Respiratory:  Negative for cough, hemoptysis, sputum production, shortness of breath and wheezing.   Cardiovascular:  Negative for chest pain, palpitations, orthopnea and claudication.  Gastrointestinal:  Negative for abdominal pain, blood in stool, constipation, diarrhea, heartburn, melena, nausea and vomiting.  Genitourinary:  Negative for dysuria, flank pain, frequency, hematuria and urgency.  Musculoskeletal:  Negative for back pain, joint pain and myalgias.  Skin:  Negative for rash.  Neurological:  Negative for dizziness, tingling, focal weakness, seizures, weakness and headaches.  Endo/Heme/Allergies:  Does not bruise/bleed easily.  Psychiatric/Behavioral:  Negative for depression and suicidal ideas. The patient does not have insomnia.      No Known Allergies   Past Medical History:  Diagnosis Date   Anxiety  Asthma    Cough    Dyspnea    with exertion   GERD (gastroesophageal reflux disease)    Hypertension      Past Surgical History:  Procedure  Laterality Date   BUNIONECTOMY Bilateral    COLONOSCOPY     PORTA CATH INSERTION N/A 12/22/2020   Procedure: PORTA CATH INSERTION;  Surgeon: Algernon Huxley, MD;  Location: [facility];  Service: Cardiovascular;  Laterality: N/A;   VIDEO BRONCHOSCOPY WITH ENDOBRONCHIAL NAVIGATION N/A 11/07/2020   Procedure: VIDEO BRONCHOSCOPY WITH ENDOBRONCHIAL NAVIGATION;  Surgeon: Ottie Glazier, MD;  Location: [facility];  Service: Thoracic;  Laterality: N/A;   VIDEO BRONCHOSCOPY WITH ENDOBRONCHIAL ULTRASOUND N/A 11/07/2020   Procedure: VIDEO BRONCHOSCOPY WITH ENDOBRONCHIAL ULTRASOUND;  Surgeon: Ottie Glazier, MD;  Location: [facility];  Service: Thoracic;  Laterality: N/A;    Social History   Socioeconomic History   Marital status: Married    Spouse name: Not on file   Number of children: Not on file   Years of education: Not on file   Highest education level: Not on file  Occupational History   Not on file  Tobacco Use   Smoking status: Former    Packs/day: 1.00    Years: 15.00    Pack years: 15.00    Types: Cigarettes    Quit date: 11/07/1994    Years since quitting: 26.4   Smokeless tobacco: Never  Vaping Use   Vaping Use: Never used  Substance and Sexual Activity   Alcohol use: Yes    Comment:  wine daily   Drug use: Never   Sexual activity: Not on file  Other Topics Concern   Not on file  Social History Narrative   Not on file   Social Determinants of Health   Financial Resource Strain: Not on file  Food Insecurity: Not on file  Transportation Needs: Not on file  Physical Activity: Not on file  Stress: Not on file  Social Connections: Not on file  Intimate Partner Violence: Not on file    History reviewed. No pertinent family history.   Current Outpatient Medications:    acetaminophen (TYLENOL) 500 MG tablet, Take 500 mg by mouth every 6 (six) hours as needed., Disp: , Rfl:    ADVAIR DISKUS 250-50 MCG/DOSE AEPB, Inhale 1 puff into the lungs as needed., Disp: , Rfl:     ALPRAZolam (XANAX) 0.5 MG tablet, Take 0.5 mg by mouth at bedtime as needed for sleep., Disp: , Rfl:    amLODipine (NORVASC) 5 MG tablet, Take 5 mg by mouth at bedtime., Disp: , Rfl:    Cholecalciferol 50 MCG (2000 UT) CAPS, Take 2,000 Units by mouth daily., Disp: , Rfl:    docusate sodium (COLACE) 100 MG capsule, Take 100 mg by mouth daily., Disp: , Rfl:    fluticasone (FLONASE) 50 MCG/ACT nasal spray, Place 2 sprays into both nostrils daily as needed for rhinitis., Disp: , Rfl:    fluticasone (FLONASE) 50 MCG/ACT nasal spray, Place into the nose., Disp: , Rfl:    folic acid (FOLVITE) 1 MG tablet, Take 1 tablet (1 mg total) by mouth daily., Disp: 30 tablet, Rfl: 2   HYDROcodone bit-homatropine (HYCODAN) 5-1.5 MG/5ML syrup, Take 5 mLs by mouth every 6 (six) hours as needed for cough., Disp: 120 mL, Rfl: 0   lansoprazole (PREVACID) 30 MG capsule, TAKE 1 CAPSULE (30 MG TOTAL) BY MOUTH DAILY AT 12 NOON., Disp: 30 capsule, Rfl: 2   metoprolol succinate (TOPROL-XL) 50 MG  24 hr tablet, Take 50 mg by mouth every morning., Disp: , Rfl:    olmesartan (BENICAR) 40 MG tablet, Take 40 mg by mouth at bedtime., Disp: , Rfl:    traMADol (ULTRAM) 50 MG tablet, Take 1 tablet (50 mg total) by mouth every 6 (six) hours as needed., Disp: 30 tablet, Rfl: 0   zolpidem (AMBIEN) 10 MG tablet, Take 10 mg by mouth at bedtime as needed for sleep., Disp: , Rfl:    amoxicillin-clavulanate (AUGMENTIN) 875-125 MG tablet, Take 1 tablet by mouth 2 (two) times daily. (Patient not taking: Reported on 04/10/2021), Disp: 14 tablet, Rfl: 0   esomeprazole (NEXIUM) 20 MG capsule, Take 20 mg by mouth as needed. (Patient not taking: No sig reported), Disp: , Rfl:    gabapentin (NEURONTIN) 300 MG capsule, Take 1 capsule (300 mg total) by mouth 3 (three) times daily. Start with 1 capsule nightly for 1 week, then 2 capsules -1 in am, 1 at night for 1 week, then 1 tablet three times a day (Patient not taking: No sig reported), Disp: 69 capsule,  Rfl: 0   predniSONE (DELTASONE) 10 MG tablet, Take by mouth 50mg  x 2 days, 40mg  x 2 days, 30mg  x 2 days, 20mg  x 2 days then stop (Patient not taking: Reported on 04/10/2021), Disp: 28 tablet, Rfl: 0 No current facility-administered medications for this visit.  Facility-Administered Medications Ordered in Other Visits:    sodium chloride flush (NS) 0.9 % injection 10 mL, 10 mL, Intravenous, Once, Sindy Guadeloupe, MD   sodium chloride flush (NS) 0.9 % injection 10 mL, 10 mL, Intravenous, PRN, Sindy Guadeloupe, MD, 10 mL at 01/26/21 0807  Physical exam:  Vitals:   04/10/21 1132  BP: (!) 153/64  Pulse: (!) 104  Resp: 20  Temp: 98 F (36.7 C)  SpO2: 99%  Weight: 145 lb (65.8 kg)   Physical Exam Cardiovascular:     Rate and Rhythm: Normal rate and regular rhythm.     Heart sounds: Normal heart sounds.  Pulmonary:     Effort: Pulmonary effort is normal.     Breath sounds: Normal breath sounds.  Skin:    General: Skin is warm and dry.  Neurological:     Mental Status: She is alert and oriented to person, place, and time.     CMP Latest Ref Rng & Units 04/10/2021  Glucose 70 - 99 mg/dL 125(H)  BUN 8 - 23 mg/dL 10  Creatinine 0.44 - 1.00 mg/dL 0.63  Sodium 135 - 145 mmol/L 136  Potassium 3.5 - 5.1 mmol/L 3.4(L)  Chloride 98 - 111 mmol/L 100  CO2 22 - 32 mmol/L 26  Calcium 8.9 - 10.3 mg/dL 9.4  Total Protein 6.5 - 8.1 g/dL 7.5  Total Bilirubin 0.3 - 1.2 mg/dL 0.5  Alkaline Phos 38 - 126 U/L 61  AST 15 - 41 U/L 28  ALT 0 - 44 U/L 24   CBC Latest Ref Rng & Units 04/10/2021  WBC 4.0 - 10.5 K/uL 7.6  Hemoglobin 12.0 - 15.0 g/dL 10.6(L)  Hematocrit 36.0 - 46.0 % 31.6(L)  Platelets 150 - 400 K/uL 351    No images are attached to the encounter.  DG Chest 2 View  Result Date: 03/27/2021 CLINICAL DATA:  Dry cough in the setting of immunotherapy EXAM: CHEST - 2 VIEW COMPARISON:  RIGHT 22 FINDINGS: Port in the anterior chest wall with tip in distal SVC. Volume loss the RIGHT hemithorax.  Mild scarring in the RIGHT upper  lobe is improved from prior. Clear.  No acute osseous abnormality. IMPRESSION: 1. Improvement in scarring in the RIGHT upper lobe. 2. Volume loss in the RIGHT lung is unchanged from prior. 3. LEFT lung clear. Electronically Signed   By: Suzy Bouchard M.D.   On: 03/27/2021 12:52   CT Chest Wo Contrast  Result Date: 03/31/2021 CLINICAL DATA:  Acute interstitial pneumonitis. Cough and wheezing for 4 weeks. EXAM: CT CHEST WITHOUT CONTRAST TECHNIQUE: Multidetector CT imaging of the chest was performed following the standard protocol without IV contrast. COMPARISON:  Chest CT 02/11/2021 and 11/05/2020. PET-CT 10/16/2020. Chest radiographs 03/27/2021. FINDINGS: Cardiovascular: Right IJ Port-A-Cath extends to the level of the mid right atrium, stable. Moderate atherosclerosis of the aorta, great vessels and coronary arteries. No acute vascular findings on noncontrast imaging. The heart size is normal. There is no pericardial effusion. Mediastinum/Nodes: There are no enlarged mediastinal, hilar or axillary lymph nodes.Small mediastinal lymph nodes are stable. Hilar assessment is limited by the lack of intravenous contrast, although the hilar contours appear unchanged. Stable mild tracheal deviation to the right in the mid chest. The thyroid gland and esophagus demonstrate no significant findings. Lungs/Pleura: There is no pleural effusion or pneumothorax. There is stable volume loss in the right hemithorax, and the treated right lower lobe lung mass is grossly stable, measuring approximately 4.7 x 4.1 cm on image 81/4. This demonstrates central cavitation and surrounding radiation changes. The radiation changes in the right upper lobe are grossly stable. There is new mixed ground-glass and airspace opacity dependently in the right lower lobe which is nonspecific, but potentially infectious in this clinical setting. The left lung appears stable with mild apical scarring. Upper abdomen:  The visualized upper abdomen appears stable without suspicious findings. Musculoskeletal/Chest wall: There is no chest wall mass or suspicious osseous finding. IMPRESSION: 1. New mixed ground-glass opacity and airspace consolidation dependently in the right lower lobe, suspicious for pneumonia. This is not well seen on the [facility] image or recent radiographs. Suggest CT follow-up in 2-3 months. 2. The treated right lower lobe lung mass in this patient with known bronchogenic carcinoma is grossly stable. Associated volume loss and surrounding radiation changes are otherwise stable. 3. No findings suspicious for or metastatic disease. Electronically Signed   By: Richardean Sale M.D.   On: 03/31/2021 15:08     Assessment and plan- Patient is a 65 y.o. female with adenocarcinoma of the right lung clinical stage III acT3 N1 M0.  She is here for on treatment assessment prior to cycle 4 of maintenance durvalumab  Counts okay to proceed with cycle 4 of maintenance durvalumab today.  I will see her back in 2 weeks for cycle 5.  Overall patient is tolerating treatment well so farAnemia has improved.  Although not quite at her baseline.  Persistent macrocytosis possibly secondary to chemotherapy.  Ferritin iron studies B12 and folate done recently were all within normal limits.  TSH is within normal limits.   Visit Diagnosis 1. Encounter for antineoplastic immunotherapy   2. Malignant neoplasm of lower lobe of right lung ([place])      Dr. Randa Evens, MD, MPH [facility] [phone] 04/12/2021 9:26 AM

## 2021-04-16 ENCOUNTER — Encounter: Payer: Self-pay | Admitting: *Deleted

## 2021-04-23 ENCOUNTER — Other Ambulatory Visit: Payer: Self-pay | Admitting: *Deleted

## 2021-04-23 DIAGNOSIS — C3431 Malignant neoplasm of lower lobe, right bronchus or lung: Secondary | ICD-10-CM

## 2021-04-24 ENCOUNTER — Inpatient Hospital Stay (HOSPITAL_BASED_OUTPATIENT_CLINIC_OR_DEPARTMENT_OTHER): Payer: BC Managed Care – PPO | Admitting: Oncology

## 2021-04-24 ENCOUNTER — Encounter: Payer: Self-pay | Admitting: Oncology

## 2021-04-24 ENCOUNTER — Inpatient Hospital Stay: Payer: BC Managed Care – PPO

## 2021-04-24 VITALS — HR 107

## 2021-04-24 VITALS — BP 148/74 | HR 118 | Temp 98.2°F | Resp 16 | Wt 142.2 lb

## 2021-04-24 DIAGNOSIS — C3431 Malignant neoplasm of lower lobe, right bronchus or lung: Secondary | ICD-10-CM | POA: Diagnosis not present

## 2021-04-24 DIAGNOSIS — Z5112 Encounter for antineoplastic immunotherapy: Secondary | ICD-10-CM | POA: Diagnosis not present

## 2021-04-24 DIAGNOSIS — Z95828 Presence of other vascular implants and grafts: Secondary | ICD-10-CM

## 2021-04-24 LAB — COMPREHENSIVE METABOLIC PANEL
ALT: 13 U/L (ref 0–44)
AST: 19 U/L (ref 15–41)
Albumin: 3.3 g/dL — ABNORMAL LOW (ref 3.5–5.0)
Alkaline Phosphatase: 78 U/L (ref 38–126)
Anion gap: 10 (ref 5–15)
BUN: 6 mg/dL — ABNORMAL LOW (ref 8–23)
CO2: 24 mmol/L (ref 22–32)
Calcium: 9.1 mg/dL (ref 8.9–10.3)
Chloride: 101 mmol/L (ref 98–111)
Creatinine, Ser: 0.66 mg/dL (ref 0.44–1.00)
GFR, Estimated: >60 mL/min (ref >60)
Glucose, Bld: 133 mg/dL — ABNORMAL HIGH (ref 70–99)
Potassium: 3.9 mmol/L (ref 3.5–5.1)
Sodium: 135 mmol/L (ref 135–145)
Total Bilirubin: 0.5 mg/dL (ref 0.3–1.2)
Total Protein: 7.6 g/dL (ref 6.5–8.1)

## 2021-04-24 LAB — CBC WITH DIFFERENTIAL/PLATELET
Abs Immature Granulocytes: 0.04 10*3/uL (ref 0.00–0.07)
Basophils Absolute: 0 10*3/uL (ref 0.0–0.1)
Basophils Relative: 1 %
Eosinophils Absolute: 0.1 10*3/uL (ref 0.0–0.5)
Eosinophils Relative: 2 %
HCT: 31.2 % — ABNORMAL LOW (ref 36.0–46.0)
Hemoglobin: 10.4 g/dL — ABNORMAL LOW (ref 12.0–15.0)
Immature Granulocytes: 1 %
Lymphocytes Relative: 18 %
Lymphs Abs: 1 10*3/uL (ref 0.7–4.0)
MCH: 34.9 pg — ABNORMAL HIGH (ref 26.0–34.0)
MCHC: 33.3 g/dL (ref 30.0–36.0)
MCV: 104.7 fL — ABNORMAL HIGH (ref 80.0–100.0)
Monocytes Absolute: 0.7 10*3/uL (ref 0.1–1.0)
Monocytes Relative: 12 %
Neutro Abs: 3.9 10*3/uL (ref 1.7–7.7)
Neutrophils Relative %: 66 %
Platelets: 333 10*3/uL (ref 150–400)
RBC: 2.98 MIL/uL — ABNORMAL LOW (ref 3.87–5.11)
RDW: 12.8 % (ref 11.5–15.5)
WBC: 5.8 10*3/uL (ref 4.0–10.5)
nRBC: 0 % (ref 0.0–0.2)

## 2021-04-24 LAB — TSH: TSH: 1.143 u[IU]/mL (ref 0.350–4.500)

## 2021-04-24 MED ORDER — HEPARIN SOD (PORK) LOCK FLUSH 100 UNIT/ML IV SOLN
500.0000 [IU] | Freq: Once | INTRAVENOUS | Status: AC
Start: 2021-04-24 — End: 2021-04-24
  Filled 2021-04-24: qty 5

## 2021-04-24 MED ORDER — SODIUM CHLORIDE 0.9 % IV SOLN
10.0000 mg/kg | Freq: Once | INTRAVENOUS | Status: AC
  Administered 2021-04-24: 620 mg via INTRAVENOUS
  Filled 2021-04-24: qty 2.4

## 2021-04-24 MED ORDER — GABAPENTIN 300 MG PO CAPS: 300.0000 mg | ORAL_CAPSULE | Freq: Three times a day (TID) | ORAL | 0 refills | Status: DC

## 2021-04-24 MED ORDER — SODIUM CHLORIDE 0.9% FLUSH
10.0000 mL | Freq: Once | INTRAVENOUS | Status: AC
  Administered 2021-04-24: 10 mL via INTRAVENOUS
  Filled 2021-04-24: qty 10

## 2021-04-24 MED ORDER — SODIUM CHLORIDE 0.9 % IV SOLN
Freq: Once | INTRAVENOUS | Status: AC
  Filled 2021-04-24: qty 250

## 2021-04-24 MED ORDER — HEPARIN SOD (PORK) LOCK FLUSH 100 UNIT/ML IV SOLN
INTRAVENOUS | Status: AC
  Administered 2021-04-24: 500 [IU] via INTRAVENOUS
  Filled 2021-04-24: qty 5

## 2021-04-24 NOTE — Patient Instructions (Signed)
[facility] ONCOLOG[facility]   Discharge Instructions: Thank you for choosing [facility] to provide your oncology and hematology care.  If you have a lab appointment with the [facility], please go directly to the [facility] and check in at the registration area.  Wear comfortable clothing and clothing appropriate for easy access to any Portacath or PICC line.   We strive to give you quality time with your provider. You may need to reschedule your appointment if you arrive late (15 or more minutes).  Arriving late affects you and other patients whose appointments are after yours.  Also, if you miss three or more appointments without notifying the office, you may be dismissed from the clinic at the provider's discretion.      For prescription refill requests, have your pharmacy contact our office and allow 72 hours for refills to be completed.    Today you received the following chemotherapy and/or immunotherapy agents: Imfinzi.      To help prevent nausea and vomiting after your treatment, we encourage you to take your nausea medication as directed.  BELOW ARE SYMPTOMS THAT SHOULD BE REPORTED IMMEDIATELY: *FEVER GREATER THAN 100.4 F (38 C) OR HIGHER *CHILLS OR SWEATING *NAUSEA AND VOMITING THAT IS NOT CONTROLLED WITH YOUR NAUSEA MEDICATION *UNUSUAL SHORTNESS OF BREATH *UNUSUAL BRUISING OR BLEEDING *URINARY PROBLEMS (pain or burning when urinating, or frequent urination) *BOWEL PROBLEMS (unusual diarrhea, constipation, pain near the anus) TENDERNESS IN MOUTH AND THROAT WITH OR WITHOUT PRESENCE OF ULCERS (sore throat, sores in mouth, or a toothache) UNUSUAL RASH, SWELLING OR PAIN  UNUSUAL VAGINAL DISCHARGE OR ITCHING   Items with * indicate a potential emergency and should be followed up as soon as possible or go to the Emergency Department if any problems should occur.  Please show the CHEMOTHERAPY ALERT CARD or IMMUNOTHERAPY ALERT CARD at check-in  to the Emergency Department and triage nurse.  Should you have questions after your visit or need to cancel or reschedule your appointment, please contact [facility]  [phone] and follow the prompts.  Office hours are 8:00 a.m. to 4:30 p.m. Monday - Friday. Please note that voicemails left after 4:00 p.m. may not be returned until the following business day.  We are closed weekends and major holidays. You have access to a nurse at all times for urgent questions. Please call the main number to the clinic [phone] and follow the prompts.  For any non-urgent questions, you may also contact your provider using MyChart. We now offer e-Visits for anyone 44 and older to request care online for non-urgent symptoms. For details visit [email or web address].   Also download the MyChart app! Go to the app store, search "MyChart", open the app, select [facility], and log in with your MyChart username and password.  Due to Covid, a mask is required upon entering the hospital/clinic. If you do not have a mask, one will be given to you upon arrival. For doctor visits, patients may have 1 support person aged 23 or older with them. For treatment visits, patients cannot have anyone with them due to current Covid guidelines and our immunocompromised population.

## 2021-04-24 NOTE — Progress Notes (Signed)
Pulse Rate: 107. MD, Dr. Janese Banks, notified and aware. Per MD order: proceed with scheduled Imfinizi treatment today.

## 2021-04-24 NOTE — Progress Notes (Signed)
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 However, has not received letter and will like to know if we can write it out instead.

## 2021-04-24 NOTE — Progress Notes (Signed)
Nutrition Follow-up:  Patient with stage III lung cancer.  Patient has completed concurrent chemotherapy and radiation therapy.  Patient on immunotherapy.   Met with patient during infusion.  Patient reports good appetite.  Eating mostly lunch and dinner. Eating chicken biscuit and drinking orange juice during visit.  Patient says that she tried the samples of oral nutrition supplements and like them.  Has not purchased anymore of them.      Medications: reviewed  Labs: reviewed  Anthropometrics:   Weight 142 lb today  145 lb on 9/9 150 lb on 6/27 157 lb on 5/24 159 lb on 5/17  11% weight loss in the last 4 months  Patient states "I needed to loose some weight."  NUTRITION DIAGNOSIS: Inadequate oral intake continues   INTERVENTION:  Explained importance of weight maintenance during treatment Recommend drinking shake for at least 1 meal per day that she is currently not eating, mostly breakfast.  Patient wants to drink shake at breakfast.     MONITORING, EVALUATION, GOAL: weight trends, intake   NEXT VISIT: Friday, Oct 21 during infusion  Diesha Rostad B. Zenia Resides, [place], [place] Registered Dietitian [phone] (mobile)

## 2021-04-25 ENCOUNTER — Encounter: Payer: Self-pay | Admitting: Oncology

## 2021-04-25 NOTE — Progress Notes (Signed)
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  She quit smoking about 26 years ago.  Other medical problems include hypertension and hyponatremia.She has been treated for iron of possible pneumonia for the last 1 year.  She has a history of intermittent asthma for which she uses Advair.  She was seen by Dr. And underwent CT chest which showed dense infiltrate/solid mass in the right lower lobe with contiguous airspace opacity in the right upper lobe.  Findings could be secondary to pneumonia but concerning for bronchogenic carcinoma.  Patient then had a PET CT scan which showed consolidation in the medial aspect of the right lower lobe measuring 3.4 x 4 cm with an SUV of 11.6.  Hypermetabolic masslike thickening in the right hilum measuring 2.2 cm with intense hypermetabolic activity.  Groundglass airspace consolidation in the posterior aspect of the right upper lobe with an SUV of 11.4.  The right upper lobe opacity was nonspecific and differentials include pulmonary infection versus lymphangitic spread of carcinoma.  CT chest showed showed masslike area of distortion involving right lower lobe middle lobe as well as right upper lobe.  Generalized right hilar masslike appearance concerning for nodal involvement.   Patient underwent bronchoscopy and biopsies.Right lower lobe was concerning for adenocarcinoma.  Lymph node station 11  R, 4R, 7 were negative for malignancy.  No malignant cells identified in the right upper lobe.  Case was discussed at tumor board and given the hypermetabolism in the right upper lobe as well as hilum involvement cannot be ruled out despite negative biopsies.  Patient completed concurrent chemoradiation with weekly CarboTaxol in July 2022.  Scan showed partial response.  Maintenance durvalumab started on 02/27/2021    Interval history- Patient continues to have symptoms of dry nonproductive cough which has been bothering her.  Denies any fever or shortness of breath.  ECOG PS- 1 Pain scale- 0 Opioid associated constipation- no  Review of systems- Review of Systems  Constitutional:  Negative for chills, fever, malaise/fatigue and weight loss.  HENT:  Negative for congestion, ear discharge and nosebleeds.   Eyes:  Negative for blurred vision.  Respiratory:  Positive for cough. Negative for hemoptysis, sputum production, shortness of breath and wheezing.   Cardiovascular:  Negative for chest pain, palpitations, orthopnea and claudication.  Gastrointestinal:  Negative for abdominal pain, blood in stool, constipation, diarrhea, heartburn, melena, nausea and vomiting.  Genitourinary:  Negative for dysuria, flank pain, frequency, hematuria and urgency.  Musculoskeletal:  Negative for back pain, joint pain and myalgias.  Skin:  Negative for rash.  Neurological:  Negative for dizziness, tingling, focal weakness, seizures, weakness and headaches.  Endo/Heme/Allergies:  Does not bruise/bleed easily.  Psychiatric/Behavioral:  Negative for depression and suicidal ideas. The patient does not have insomnia.      No Known Allergies   Past Medical History:  Diagnosis Date   Anxiety    Asthma    Cough    Dyspnea  with exertion   GERD (gastroesophageal reflux disease)    Hypertension      Past Surgical History:  Procedure Laterality Date   BUNIONECTOMY Bilateral    COLONOSCOPY     PORTA CATH  INSERTION N/A 12/22/2020   Procedure: PORTA CATH INSERTION;  Surgeon: Algernon Huxley, MD;  Location: [facility];  Service: Cardiovascular;  Laterality: N/A;   VIDEO BRONCHOSCOPY WITH ENDOBRONCHIAL NAVIGATION N/A 11/07/2020   Procedure: VIDEO BRONCHOSCOPY WITH ENDOBRONCHIAL NAVIGATION;  Surgeon: Ottie Glazier, MD;  Location: [facility];  Service: Thoracic;  Laterality: N/A;   VIDEO BRONCHOSCOPY WITH ENDOBRONCHIAL ULTRASOUND N/A 11/07/2020   Procedure: VIDEO BRONCHOSCOPY WITH ENDOBRONCHIAL ULTRASOUND;  Surgeon: Ottie Glazier, MD;  Location: [facility];  Service: Thoracic;  Laterality: N/A;    Social History   Socioeconomic History   Marital status: Married    Spouse name: Not on file   Number of children: Not on file   Years of education: Not on file   Highest education level: Not on file  Occupational History   Not on file  Tobacco Use   Smoking status: Former    Packs/day: 1.00    Years: 15.00    Pack years: 15.00    Types: Cigarettes    Quit date: 11/07/1994    Years since quitting: 26.4   Smokeless tobacco: Never  Vaping Use   Vaping Use: Never used  Substance and Sexual Activity   Alcohol use: Yes    Comment:  wine daily   Drug use: Never   Sexual activity: Not on file  Other Topics Concern   Not on file  Social History Narrative   Not on file   Social Determinants of Health   Financial Resource Strain: Not on file  Food Insecurity: Not on file  Transportation Needs: Not on file  Physical Activity: Not on file  Stress: Not on file  Social Connections: Not on file  Intimate Partner Violence: Not on file    History reviewed. No pertinent family history.   Current Outpatient Medications:    acetaminophen (TYLENOL) 500 MG tablet, Take 500 mg by mouth every 6 (six) hours as needed., Disp: , Rfl:    ADVAIR DISKUS 250-50 MCG/DOSE AEPB, Inhale 1 puff into the lungs as needed., Disp: , Rfl:    ALPRAZolam (XANAX) 0.5 MG tablet, Take 0.5 mg by mouth at bedtime as  needed for sleep., Disp: , Rfl:    amLODipine (NORVASC) 5 MG tablet, Take 5 mg by mouth at bedtime., Disp: , Rfl:    Cholecalciferol 50 MCG (2000 UT) CAPS, Take 2,000 Units by mouth daily., Disp: , Rfl:    fluticasone (FLONASE) 50 MCG/ACT nasal spray, Place 2 sprays into both nostrils daily as needed for rhinitis., Disp: , Rfl:    fluticasone (FLONASE) 50 MCG/ACT nasal spray, Place into the nose., Disp: , Rfl:    folic acid (FOLVITE) 1 MG tablet, Take 1 tablet (1 mg total) by mouth daily., Disp: 30 tablet, Rfl: 2   metoprolol succinate (TOPROL-XL) 50 MG 24 hr tablet, Take 50 mg by mouth every morning., Disp: , Rfl:    olmesartan (BENICAR) 40 MG tablet, Take 40 mg by mouth at bedtime., Disp: , Rfl:    sulfamethoxazole-trimethoprim (BACTRIM) 400-80 MG tablet, SMARTSIG:1 Tablet(s) By Mouth Every 12 Hours, Disp: , Rfl:    docusate sodium (COLACE) 100 MG capsule, Take 100 mg by mouth daily. (Patient not taking: Reported on 04/24/2021), Disp: , Rfl:    esomeprazole (NEXIUM) 20 MG  capsule, Take 20 mg by mouth as needed. (Patient not taking: No sig reported), Disp: , Rfl:    gabapentin (NEURONTIN) 300 MG capsule, Take 1 capsule (300 mg total) by mouth 3 (three) times daily. Start with 1 capsule nightly for 2 days, then 2 capsules after 2 days -1 in am, 1 at night for 1 week, then 1 tablet three times a day, Disp: 69 capsule, Rfl: 0   HYDROcodone bit-homatropine (HYCODAN) 5-1.5 MG/5ML syrup, Take 5 mLs by mouth every 6 (six) hours as needed for cough. (Patient not taking: Reported on 04/24/2021), Disp: 120 mL, Rfl: 0   lansoprazole (PREVACID) 30 MG capsule, TAKE 1 CAPSULE (30 MG TOTAL) BY MOUTH DAILY AT 12 NOON. (Patient not taking: Reported on 04/24/2021), Disp: 30 capsule, Rfl: 2   predniSONE (DELTASONE) 10 MG tablet, Take by mouth 50mg  x 2 days, 40mg  x 2 days, 30mg  x 2 days, 20mg  x 2 days then stop (Patient not taking: Reported on 04/10/2021), Disp: 28 tablet, Rfl: 0   traMADol (ULTRAM) 50 MG tablet, Take 1  tablet (50 mg total) by mouth every 6 (six) hours as needed. (Patient not taking: Reported on 04/24/2021), Disp: 30 tablet, Rfl: 0   zolpidem (AMBIEN) 10 MG tablet, Take 10 mg by mouth at bedtime as needed for sleep. (Patient not taking: Reported on 04/24/2021), Disp: , Rfl:  No current facility-administered medications for this visit.  Facility-Administered Medications Ordered in Other Visits:    sodium chloride flush (NS) 0.9 % injection 10 mL, 10 mL, Intravenous, Once, Sindy Guadeloupe, MD   sodium chloride flush (NS) 0.9 % injection 10 mL, 10 mL, Intravenous, PRN, Sindy Guadeloupe, MD, 10 mL at 01/26/21 0807  Physical exam:  Vitals:   04/24/21 1013  BP: (!) 148/74  Pulse: (!) 118  Resp: 16  Temp: 98.2 F (36.8 C)  SpO2: 100%  Weight: 142 lb 3.2 oz (64.5 kg)   Physical Exam Cardiovascular:     Rate and Rhythm: Regular rhythm. Tachycardia present.     Heart sounds: Normal heart sounds.  Pulmonary:     Effort: Pulmonary effort is normal.     Comments: Scattered bilateral rhonchi Abdominal:     General: Bowel sounds are normal.     Palpations: Abdomen is soft.  Skin:    General: Skin is warm and dry.  Neurological:     Mental Status: She is alert and oriented to person, place, and time.     CMP Latest Ref Rng & Units 04/24/2021  Glucose 70 - 99 mg/dL 133(H)  BUN 8 - 23 mg/dL 6(L)  Creatinine 0.44 - 1.00 mg/dL 0.66  Sodium 135 - 145 mmol/L 135  Potassium 3.5 - 5.1 mmol/L 3.9  Chloride 98 - 111 mmol/L 101  CO2 22 - 32 mmol/L 24  Calcium 8.9 - 10.3 mg/dL 9.1  Total Protein 6.5 - 8.1 g/dL 7.6  Total Bilirubin 0.3 - 1.2 mg/dL 0.5  Alkaline Phos 38 - 126 U/L 78  AST 15 - 41 U/L 19  ALT 0 - 44 U/L 13   CBC Latest Ref Rng & Units 04/24/2021  WBC 4.0 - 10.5 K/uL 5.8  Hemoglobin 12.0 - 15.0 g/dL 10.4(L)  Hematocrit 36.0 - 46.0 % 31.2(L)  Platelets 150 - 400 K/uL 333    No images are attached to the encounter.  DG Chest 2 View  Result Date: 03/27/2021 CLINICAL DATA:  Dry  cough in the setting of immunotherapy EXAM: CHEST - 2 VIEW COMPARISON:  RIGHT  22 FINDINGS: Port in the anterior chest wall with tip in distal SVC. Volume loss the RIGHT hemithorax. Mild scarring in the RIGHT upper lobe is improved from prior. Clear.  No acute osseous abnormality. IMPRESSION: 1. Improvement in scarring in the RIGHT upper lobe. 2. Volume loss in the RIGHT lung is unchanged from prior. 3. LEFT lung clear. Electronically Signed   By: Suzy Bouchard M.D.   On: 03/27/2021 12:52   CT Chest Wo Contrast  Result Date: 03/31/2021 CLINICAL DATA:  Acute interstitial pneumonitis. Cough and wheezing for 4 weeks. EXAM: CT CHEST WITHOUT CONTRAST TECHNIQUE: Multidetector CT imaging of the chest was performed following the standard protocol without IV contrast. COMPARISON:  Chest CT 02/11/2021 and 11/05/2020. PET-CT 10/16/2020. Chest radiographs 03/27/2021. FINDINGS: Cardiovascular: Right IJ Port-A-Cath extends to the level of the mid right atrium, stable. Moderate atherosclerosis of the aorta, great vessels and coronary arteries. No acute vascular findings on noncontrast imaging. The heart size is normal. There is no pericardial effusion. Mediastinum/Nodes: There are no enlarged mediastinal, hilar or axillary lymph nodes.Small mediastinal lymph nodes are stable. Hilar assessment is limited by the lack of intravenous contrast, although the hilar contours appear unchanged. Stable mild tracheal deviation to the right in the mid chest. The thyroid gland and esophagus demonstrate no significant findings. Lungs/Pleura: There is no pleural effusion or pneumothorax. There is stable volume loss in the right hemithorax, and the treated right lower lobe lung mass is grossly stable, measuring approximately 4.7 x 4.1 cm on image 81/4. This demonstrates central cavitation and surrounding radiation changes. The radiation changes in the right upper lobe are grossly stable. There is new mixed ground-glass and airspace opacity  dependently in the right lower lobe which is nonspecific, but potentially infectious in this clinical setting. The left lung appears stable with mild apical scarring. Upper abdomen: The visualized upper abdomen appears stable without suspicious findings. Musculoskeletal/Chest wall: There is no chest wall mass or suspicious osseous finding. IMPRESSION: 1. New mixed ground-glass opacity and airspace consolidation dependently in the right lower lobe, suspicious for pneumonia. This is not well seen on the scout image or recent radiographs. Suggest CT follow-up in 2-3 months. 2. The treated right lower lobe lung mass in this patient with known bronchogenic carcinoma is grossly stable. Associated volume loss and surrounding radiation changes are otherwise stable. 3. No findings suspicious for or metastatic disease. Electronically Signed   By: Richardean Sale M.D.   On: 03/31/2021 15:08     Assessment and plan- Patient is a 65 y.o. female with adenocarcinoma of the right lung clinical stage III acT3 N1 M0.  She is here for on treatment assessment prior to cycle 5 of maintenance durvalumab  Cough: Patient continues to have symptoms of cough and she has bilateral rhonchi on examination.  Her cough has been present even prior to diagnosis of lung cancer.  She has been treated with multiple rounds of antibiotics in the past.  She was also seen by Dr. 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  Patient reports some improvement in her symptoms but has not felt better all the way.  We also did a repeat CT scan to rule out radiation/durvalumab induced pneumonitis and there were no overt findings of pneumonitis on the CTNew mixed groundglass and airspace opacity in the right lower lobe potentially infectious and treated right lower lobe lung mass overall stable with surrounding radiation changes was noted.  I therefore do not feel that the  patient's ongoing symptoms are due to durvalumab since she has had them even prior to starting treatment.  I will therefore proceed with durvalumab today and she will be seen by covering NP in 2 weeks for cycle 6 and I will see her for cycle 7.  Plan to repeat CT chest abdomen pelvis in November 2022.  I will start the patient on gabapentin 300 mg at night which she will slowly increase to 3 times a day for her cough in addition to Hycodan cough syrup that she is taking.  She is also continuing Bactrim at this time     Visit Diagnosis 1. Encounter for antineoplastic immunotherapy   2. Malignant neoplasm of lower lobe of right lung ([place])      Dr. Randa Evens, MD, MPH [facility] [phone] 04/25/2021 10:53 AM

## 2021-04-30 ENCOUNTER — Encounter: Payer: Self-pay | Admitting: *Deleted

## 2021-05-08 ENCOUNTER — Inpatient Hospital Stay: Payer: BC Managed Care – PPO

## 2021-05-08 ENCOUNTER — Inpatient Hospital Stay (HOSPITAL_BASED_OUTPATIENT_CLINIC_OR_DEPARTMENT_OTHER): Payer: BC Managed Care – PPO | Admitting: Nurse Practitioner

## 2021-05-08 ENCOUNTER — Encounter: Payer: Self-pay | Admitting: Nurse Practitioner

## 2021-05-08 ENCOUNTER — Inpatient Hospital Stay: Payer: BC Managed Care – PPO | Attending: Oncology

## 2021-05-08 VITALS — BP 111/64

## 2021-05-08 VITALS — BP 137/90 | HR 92 | Temp 98.3°F | Resp 18 | Wt 142.4 lb

## 2021-05-08 DIAGNOSIS — Z79899 Other long term (current) drug therapy: Secondary | ICD-10-CM | POA: Insufficient documentation

## 2021-05-08 DIAGNOSIS — J302 Other seasonal allergic rhinitis: Secondary | ICD-10-CM

## 2021-05-08 DIAGNOSIS — Z5112 Encounter for antineoplastic immunotherapy: Secondary | ICD-10-CM

## 2021-05-08 DIAGNOSIS — C3431 Malignant neoplasm of lower lobe, right bronchus or lung: Secondary | ICD-10-CM | POA: Insufficient documentation

## 2021-05-08 LAB — CBC WITH DIFFERENTIAL/PLATELET
Abs Immature Granulocytes: 0.03 10*3/uL (ref 0.00–0.07)
Basophils Absolute: 0 10*3/uL (ref 0.0–0.1)
Basophils Relative: 0 %
Eosinophils Absolute: 0.1 10*3/uL (ref 0.0–0.5)
Eosinophils Relative: 1 %
HCT: 31.5 % — ABNORMAL LOW (ref 36.0–46.0)
Hemoglobin: 10.3 g/dL — ABNORMAL LOW (ref 12.0–15.0)
Immature Granulocytes: 0 %
Lymphocytes Relative: 12 %
Lymphs Abs: 1 10*3/uL (ref 0.7–4.0)
MCH: 34.1 pg — ABNORMAL HIGH (ref 26.0–34.0)
MCHC: 32.7 g/dL (ref 30.0–36.0)
MCV: 104.3 fL — ABNORMAL HIGH (ref 80.0–100.0)
Monocytes Absolute: 0.5 10*3/uL (ref 0.1–1.0)
Monocytes Relative: 7 %
Neutro Abs: 6.4 10*3/uL (ref 1.7–7.7)
Neutrophils Relative %: 80 %
Platelets: 337 10*3/uL (ref 150–400)
RBC: 3.02 MIL/uL — ABNORMAL LOW (ref 3.87–5.11)
RDW: 13.3 % (ref 11.5–15.5)
WBC: 8 10*3/uL (ref 4.0–10.5)
nRBC: 0 % (ref 0.0–0.2)

## 2021-05-08 LAB — COMPREHENSIVE METABOLIC PANEL
ALT: 12 U/L (ref 0–44)
AST: 21 U/L (ref 15–41)
Albumin: 3.6 g/dL (ref 3.5–5.0)
Alkaline Phosphatase: 64 U/L (ref 38–126)
Anion gap: 9 (ref 5–15)
BUN: 10 mg/dL (ref 8–23)
CO2: 24 mmol/L (ref 22–32)
Calcium: 9.1 mg/dL (ref 8.9–10.3)
Chloride: 100 mmol/L (ref 98–111)
Creatinine, Ser: 0.69 mg/dL (ref 0.44–1.00)
GFR, Estimated: >60 mL/min (ref >60)
Glucose, Bld: 111 mg/dL — ABNORMAL HIGH (ref 70–99)
Potassium: 3.8 mmol/L (ref 3.5–5.1)
Sodium: 133 mmol/L — ABNORMAL LOW (ref 135–145)
Total Bilirubin: 0.2 mg/dL — ABNORMAL LOW (ref 0.3–1.2)
Total Protein: 7.7 g/dL (ref 6.5–8.1)

## 2021-05-08 MED ORDER — HEPARIN SOD (PORK) LOCK FLUSH 100 UNIT/ML IV SOLN
500.0000 [IU] | Freq: Once | INTRAVENOUS | Status: AC | PRN
  Administered 2021-05-08: 500 [IU]
  Filled 2021-05-08: qty 5

## 2021-05-08 MED ORDER — HEPARIN SOD (PORK) LOCK FLUSH 100 UNIT/ML IV SOLN
INTRAVENOUS | Status: AC
  Filled 2021-05-08: qty 5

## 2021-05-08 MED ORDER — SODIUM CHLORIDE 0.9 % IV SOLN
10.0000 mg/kg | Freq: Once | INTRAVENOUS | Status: AC
  Administered 2021-05-08: 620 mg via INTRAVENOUS
  Filled 2021-05-08: qty 10

## 2021-05-08 MED ORDER — AZELASTINE HCL 0.1 % NA SOLN: 2.0000 | Freq: Two times a day (BID) | NASAL | 2 refills | Status: AC

## 2021-05-08 MED ORDER — SODIUM CHLORIDE 0.9 % IV SOLN
Freq: Once | INTRAVENOUS | Status: AC
  Filled 2021-05-08: qty 250

## 2021-05-08 NOTE — Progress Notes (Signed)
Pt states she has been having a earache for about a week now; will wake her up out of her sleep; takes tylenol but pain comes back after 6 hours.

## 2021-05-08 NOTE — Progress Notes (Signed)
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  She quit smoking about 26 years ago.  Other medical problems include hypertension and hyponatremia.She has been treated for iron of possible pneumonia for the last 1 year.  She has a history of intermittent asthma for which she uses Advair.  She was seen by Dr. And underwent CT chest which showed dense infiltrate/solid mass in the right lower lobe with contiguous airspace opacity in the right upper lobe.  Findings could be secondary to pneumonia but concerning for bronchogenic carcinoma.  Patient then had a PET CT scan which showed consolidation in the medial aspect of the right lower lobe measuring 3.4 x 4 cm with an SUV of 11.6.  Hypermetabolic masslike thickening in the right hilum measuring 2.2 cm with intense hypermetabolic activity.  Groundglass airspace consolidation in the posterior aspect of the right upper lobe with an SUV of 11.4.  The right upper lobe opacity was nonspecific and differentials include pulmonary infection versus lymphangitic spread of carcinoma.  CT chest showed showed masslike area of distortion involving right lower lobe middle lobe as well as right upper lobe.  Generalized right hilar masslike appearance concerning for nodal involvement.   Patient underwent bronchoscopy and biopsies.Right lower lobe was concerning for adenocarcinoma.  Lymph node station 11  R, 4R, 7 were negative for malignancy.  No malignant cells identified in the right upper lobe.  Case was discussed at tumor board and given the hypermetabolism in the right upper lobe as well as hilum involvement cannot be ruled out despite negative biopsies.  Patient completed concurrent chemoradiation with weekly CarboTaxol in July 2022.  Scan showed partial response.  Maintenance durvalumab started on 02/27/2021    Interval history-patient returns to clinic for labs, further evaluation, and consideration of devalue Mab.  She complains of right ear pain and allergies.  She has been using Flonase without much success.  Cough seems better.  Has not had to use cough syrup.  Otherwise feels well and denies other specific complaints.  No fevers, shortness of breath  ECOG PS- 1 Pain scale- 0 Opioid associated constipation- no  Review of systems- Review of Systems  Constitutional:  Negative for chills, fever, malaise/fatigue and weight loss.  HENT:  Positive for congestion and ear pain. Negative for ear discharge and nosebleeds.   Eyes:  Negative for blurred vision, pain, discharge and redness.  Respiratory:  Negative for cough, hemoptysis, sputum production, shortness of breath and wheezing.   Cardiovascular:  Negative for chest pain, palpitations, orthopnea and claudication.  Gastrointestinal:  Negative for abdominal pain, blood in stool, constipation, diarrhea, heartburn, melena, nausea and vomiting.  Genitourinary:  Negative for dysuria, flank pain, frequency, hematuria and urgency.  Musculoskeletal:  Negative for back pain, joint pain and myalgias.  Skin:  Negative for rash.  Neurological:  Negative for dizziness, tingling, focal weakness, seizures, weakness and headaches.  Endo/Heme/Allergies:  Does not bruise/bleed easily.  Psychiatric/Behavioral:  Negative for depression and suicidal  ideas. The patient does not have insomnia.      No Known Allergies   Past Medical History:  Diagnosis  Date   Anxiety    Asthma    Cough    Dyspnea    with exertion   GERD (gastroesophageal reflux disease)    Hypertension      Past Surgical History:  Procedure Laterality Date   BUNIONECTOMY Bilateral    COLONOSCOPY     PORTA CATH INSERTION N/A 12/22/2020   Procedure: PORTA CATH INSERTION;  Surgeon: Algernon Huxley, MD;  Location: [facility];  Service: Cardiovascular;  Laterality: N/A;   VIDEO BRONCHOSCOPY WITH ENDOBRONCHIAL NAVIGATION N/A 11/07/2020   Procedure: VIDEO BRONCHOSCOPY WITH ENDOBRONCHIAL NAVIGATION;  Surgeon: Ottie Glazier, MD;  Location: [facility];  Service: Thoracic;  Laterality: N/A;   VIDEO BRONCHOSCOPY WITH ENDOBRONCHIAL ULTRASOUND N/A 11/07/2020   Procedure: VIDEO BRONCHOSCOPY WITH ENDOBRONCHIAL ULTRASOUND;  Surgeon: Ottie Glazier, MD;  Location: [facility];  Service: Thoracic;  Laterality: N/A;    Social History   Socioeconomic History   Marital status: Married    Spouse name: Not on file   Number of children: Not on file   Years of education: Not on file   Highest education level: Not on file  Occupational History   Not on file  Tobacco Use   Smoking status: Former    Packs/day: 1.00    Years: 15.00    Pack years: 15.00    Types: Cigarettes    Quit date: 11/07/1994    Years since quitting: 26.5   Smokeless tobacco: Never  Vaping Use   Vaping Use: Never used  Substance and Sexual Activity   Alcohol use: Yes    Comment:  wine daily   Drug use: Never   Sexual activity: Not on file  Other Topics Concern   Not on file  Social History Narrative   Not on file   Social Determinants of Health   Financial Resource Strain: Not on file  Food Insecurity: Not on file  Transportation Needs: Not on file  Physical Activity: Not on file  Stress: Not on file  Social Connections: Not on file  Intimate Partner Violence: Not on file    History reviewed. No pertinent family history.   Current Outpatient Medications:    acetaminophen (TYLENOL) 500 MG  tablet, Take 500 mg by mouth every 6 (six) hours as needed., Disp: , Rfl:    ADVAIR DISKUS 250-50 MCG/DOSE AEPB, Inhale 1 puff into the lungs as needed., Disp: , Rfl:    ALPRAZolam (XANAX) 0.5 MG tablet, Take 0.5 mg by mouth at bedtime as needed for sleep., Disp: , Rfl:    amLODipine (NORVASC) 5 MG tablet, Take 5 mg by mouth at bedtime., Disp: , Rfl:    Cholecalciferol 50 MCG (2000 UT) CAPS, Take 2,000 Units by mouth daily., Disp: , Rfl:    fluticasone (FLONASE) 50 MCG/ACT nasal spray, Place 2 sprays into both nostrils daily as needed for rhinitis., Disp: , Rfl:    fluticasone (FLONASE) 50 MCG/ACT nasal spray, Place into the nose., Disp: , Rfl:    gabapentin (NEURONTIN) 300 MG capsule, Take 1 capsule (300 mg total) by mouth 3 (three) times daily. Start with 1 capsule nightly for 2 days, then 2 capsules after 2 days -1 in am, 1 at night for 1 week, then 1 tablet three times a day, Disp: 69 capsule, Rfl: 0   sulfamethoxazole-trimethoprim (BACTRIM) 400-80 MG tablet, SMARTSIG:1 Tablet(s) By Mouth Every 12 Hours,  Disp: , Rfl:    docusate sodium (COLACE) 100 MG capsule, Take 100 mg by mouth daily. (Patient not taking: No sig reported), Disp: , Rfl:    esomeprazole (NEXIUM) 20 MG capsule, Take 20 mg by mouth as needed. (Patient not taking: No sig reported), Disp: , Rfl:    folic acid (FOLVITE) 1 MG tablet, Take 1 tablet (1 mg total) by mouth daily. (Patient not taking: Reported on 05/08/2021), Disp: 30 tablet, Rfl: 2   HYDROcodone bit-homatropine (HYCODAN) 5-1.5 MG/5ML syrup, Take 5 mLs by mouth every 6 (six) hours as needed for cough. (Patient not taking: No sig reported), Disp: 120 mL, Rfl: 0   lansoprazole (PREVACID) 30 MG capsule, TAKE 1 CAPSULE (30 MG TOTAL) BY MOUTH DAILY AT 12 NOON. (Patient not taking: No sig reported), Disp: 30 capsule, Rfl: 2   metoprolol succinate (TOPROL-XL) 50 MG 24 hr tablet, Take 50 mg by mouth every morning. (Patient not taking: Reported on 05/08/2021), Disp: , Rfl:     olmesartan (BENICAR) 40 MG tablet, Take 40 mg by mouth at bedtime. (Patient not taking: Reported on 05/08/2021), Disp: , Rfl:    predniSONE (DELTASONE) 10 MG tablet, Take by mouth 50mg  x 2 days, 40mg  x 2 days, 30mg  x 2 days, 20mg  x 2 days then stop (Patient not taking: No sig reported), Disp: 28 tablet, Rfl: 0   traMADol (ULTRAM) 50 MG tablet, Take 1 tablet (50 mg total) by mouth every 6 (six) hours as needed. (Patient not taking: No sig reported), Disp: 30 tablet, Rfl: 0   zolpidem (AMBIEN) 10 MG tablet, Take 10 mg by mouth at bedtime as needed for sleep. (Patient not taking: No sig reported), Disp: , Rfl:  No current facility-administered medications for this visit.  Facility-Administered Medications Ordered in Other Visits:    sodium chloride flush (NS) 0.9 % injection 10 mL, 10 mL, Intravenous, Once, Sindy Guadeloupe, MD   sodium chloride flush (NS) 0.9 % injection 10 mL, 10 mL, Intravenous, PRN, Sindy Guadeloupe, MD, 10 mL at 01/26/21 0807  Physical exam:  Vitals:   05/08/21 0950  BP: 137/90  Pulse: 92  Resp: 18  Temp: 98.3 F (36.8 C)  SpO2: 99%  Weight: 142 lb 6.4 oz (64.6 kg)   Physical Exam HENT:     Right Ear: Tympanic membrane, ear canal and external ear normal. There is no impacted cerumen.     Nose: Congestion and rhinorrhea present.     Mouth/Throat:     Comments: Clear drainage Cardiovascular:     Rate and Rhythm: Normal rate and regular rhythm.  Pulmonary:     Effort: Pulmonary effort is normal.     Comments: Scattered bilateral rhonchi Abdominal:     General: Bowel sounds are normal.     Palpations: Abdomen is soft.  Skin:    General: Skin is warm and dry.  Neurological:     Mental Status: She is alert and oriented to person, place, and time.  Psychiatric:        Mood and Affect: Mood normal.        Behavior: Behavior normal.     CMP Latest Ref Rng & Units 05/08/2021  Glucose 70 - 99 mg/dL 111(H)  BUN 8 - 23 mg/dL 10  Creatinine 0.44 - 1.00 mg/dL 0.69   Sodium 135 - 145 mmol/L 133(L)  Potassium 3.5 - 5.1 mmol/L 3.8  Chloride 98 - 111 mmol/L 100  CO2 22 - 32 mmol/L 24  Calcium 8.9 - 10.3  mg/dL 9.1  Total Protein 6.5 - 8.1 g/dL 7.7  Total Bilirubin 0.3 - 1.2 mg/dL 0.2(L)  Alkaline Phos 38 - 126 U/L 64  AST 15 - 41 U/L 21  ALT 0 - 44 U/L 12   CBC Latest Ref Rng & Units 05/08/2021  WBC 4.0 - 10.5 K/uL 8.0  Hemoglobin 12.0 - 15.0 g/dL 10.3(L)  Hematocrit 36.0 - 46.0 % 31.5(L)  Platelets 150 - 400 K/uL 337    No images are attached to the encounter.  No results found.   Assessment and plan- Patient is a 65 y.o. female with adenocarcinoma of the right lung clinical stage III acT3 N1 M0.  She is here for on treatment assessment prior to cycle 6 of maintenance durvalumab. Labs reviewed and acceptable for treatment. Tolerating treatment well.   Cough: improved. Unrelated to durvalumab. Monitor. Continue gabapentin at night. She has not needed hycodan syrup.   Right ear pain- suspect related to allergic rhinitis based on symptoms and clinical findings.I recommended flonase 1 spray each nare twice a day to help to open up the paranasal sinuses, to improve outflow. We discussed the pathophysiology of how edema from viruses can cause sinus obstruction and secondary bacterial infection, and ultimately ventilating the sinuses is critical to improving and overcoming the infection. No indication for antibiotics at this time. Will also start her on azelastine nasal spray, 2 sprays each nare twicea  day fora llergic symptoms.   RTC in 2 weeks for labs, Janese Banks, cycle 7 of durvalumab. Plan to repeat CT chest abdomen pelvis in November 2022.  Visit Diagnosis 1. Encounter for antineoplastic immunotherapy    Beckey Rutter, [place], AGNP-C [place] at [facility] [phone] (clinic) 05/08/2021

## 2021-05-11 ENCOUNTER — Encounter: Payer: Self-pay | Admitting: Oncology

## 2021-05-13 ENCOUNTER — Other Ambulatory Visit (HOSPITAL_COMMUNITY): Payer: Self-pay

## 2021-05-13 ENCOUNTER — Encounter: Payer: Self-pay | Admitting: Oncology

## 2021-05-13 MED ORDER — INFLUENZA VAC SPLIT QUAD 0.5 ML IM SUSY
PREFILLED_SYRINGE | INTRAMUSCULAR | 0 refills | Status: DC
  Filled 2021-05-13: qty 0.5, 1d supply, fill #0

## 2021-05-14 ENCOUNTER — Other Ambulatory Visit: Payer: Self-pay | Admitting: Internal Medicine

## 2021-05-14 DIAGNOSIS — Z139 Encounter for screening, unspecified: Secondary | ICD-10-CM

## 2021-05-16 ENCOUNTER — Other Ambulatory Visit: Payer: Self-pay | Admitting: Internal Medicine

## 2021-05-16 DIAGNOSIS — Z139 Encounter for screening, unspecified: Secondary | ICD-10-CM

## 2021-05-20 ENCOUNTER — Other Ambulatory Visit: Payer: Self-pay | Admitting: Internal Medicine

## 2021-05-20 DIAGNOSIS — Z1231 Encounter for screening mammogram for malignant neoplasm of breast: Secondary | ICD-10-CM

## 2021-05-22 ENCOUNTER — Inpatient Hospital Stay: Payer: BC Managed Care – PPO

## 2021-05-22 ENCOUNTER — Other Ambulatory Visit: Payer: Self-pay

## 2021-05-22 ENCOUNTER — Inpatient Hospital Stay (HOSPITAL_BASED_OUTPATIENT_CLINIC_OR_DEPARTMENT_OTHER): Payer: BC Managed Care – PPO | Admitting: Oncology

## 2021-05-22 ENCOUNTER — Ambulatory Visit
Admission: RE | Admit: 2021-05-22 | Discharge: 2021-05-22 | Disposition: A | Discharge status: Home / Self Care | Payer: BC Managed Care – PPO | Source: Ambulatory Visit | Attending: Radiation Oncology | Admitting: Radiation Oncology

## 2021-05-22 ENCOUNTER — Encounter: Payer: Self-pay | Admitting: Oncology

## 2021-05-22 VITALS — BP 134/73 | HR 89 | Temp 97.1°F | Resp 16 | Wt 142.3 lb

## 2021-05-22 VITALS — BP 134/73 | HR 89 | Temp 97.1°F | Resp 16 | Wt 142.4 lb

## 2021-05-22 DIAGNOSIS — Z8701 Personal history of pneumonia (recurrent): Secondary | ICD-10-CM | POA: Diagnosis not present

## 2021-05-22 DIAGNOSIS — C3490 Malignant neoplasm of unspecified part of unspecified bronchus or lung: Secondary | ICD-10-CM

## 2021-05-22 DIAGNOSIS — Z5112 Encounter for antineoplastic immunotherapy: Secondary | ICD-10-CM | POA: Diagnosis not present

## 2021-05-22 DIAGNOSIS — C3431 Malignant neoplasm of lower lobe, right bronchus or lung: Secondary | ICD-10-CM

## 2021-05-22 DIAGNOSIS — Z9221 Personal history of antineoplastic chemotherapy: Secondary | ICD-10-CM | POA: Diagnosis not present

## 2021-05-22 DIAGNOSIS — Z923 Personal history of irradiation: Secondary | ICD-10-CM | POA: Insufficient documentation

## 2021-05-22 LAB — COMPREHENSIVE METABOLIC PANEL
ALT: 19 U/L (ref 0–44)
AST: 25 U/L (ref 15–41)
Albumin: 3.8 g/dL (ref 3.5–5.0)
Alkaline Phosphatase: 61 U/L (ref 38–126)
Anion gap: 9 (ref 5–15)
BUN: 10 mg/dL (ref 8–23)
CO2: 26 mmol/L (ref 22–32)
Calcium: 9.3 mg/dL (ref 8.9–10.3)
Chloride: 100 mmol/L (ref 98–111)
Creatinine, Ser: 0.66 mg/dL (ref 0.44–1.00)
GFR, Estimated: >60 mL/min (ref >60)
Glucose, Bld: 118 mg/dL — ABNORMAL HIGH (ref 70–99)
Potassium: 3.8 mmol/L (ref 3.5–5.1)
Sodium: 135 mmol/L (ref 135–145)
Total Bilirubin: 0.5 mg/dL (ref 0.3–1.2)
Total Protein: 7.8 g/dL (ref 6.5–8.1)

## 2021-05-22 LAB — CBC WITH DIFFERENTIAL/PLATELET
Abs Immature Granulocytes: 0.05 10*3/uL (ref 0.00–0.07)
Basophils Absolute: 0 10*3/uL (ref 0.0–0.1)
Basophils Relative: 0 %
Eosinophils Absolute: 0.2 10*3/uL (ref 0.0–0.5)
Eosinophils Relative: 2 %
HCT: 34.3 % — ABNORMAL LOW (ref 36.0–46.0)
Hemoglobin: 11.2 g/dL — ABNORMAL LOW (ref 12.0–15.0)
Immature Granulocytes: 1 %
Lymphocytes Relative: 13 %
Lymphs Abs: 1.1 10*3/uL (ref 0.7–4.0)
MCH: 33.7 pg (ref 26.0–34.0)
MCHC: 32.7 g/dL (ref 30.0–36.0)
MCV: 103.3 fL — ABNORMAL HIGH (ref 80.0–100.0)
Monocytes Absolute: 0.5 10*3/uL (ref 0.1–1.0)
Monocytes Relative: 6 %
Neutro Abs: 6.8 10*3/uL (ref 1.7–7.7)
Neutrophils Relative %: 78 %
Platelets: 314 10*3/uL (ref 150–400)
RBC: 3.32 MIL/uL — ABNORMAL LOW (ref 3.87–5.11)
RDW: 14.1 % (ref 11.5–15.5)
WBC: 8.7 10*3/uL (ref 4.0–10.5)
nRBC: 0 % (ref 0.0–0.2)

## 2021-05-22 MED ORDER — HEPARIN SOD (PORK) LOCK FLUSH 100 UNIT/ML IV SOLN
500.0000 [IU] | Freq: Once | INTRAVENOUS | Status: AC | PRN
  Administered 2021-05-22: 500 [IU]
  Filled 2021-05-22: qty 5

## 2021-05-22 MED ORDER — SODIUM CHLORIDE 0.9 % IV SOLN
10.0000 mg/kg | Freq: Once | INTRAVENOUS | Status: AC
  Administered 2021-05-22: 620 mg via INTRAVENOUS
  Filled 2021-05-22: qty 10

## 2021-05-22 MED ORDER — SODIUM CHLORIDE 0.9% FLUSH
10.0000 mL | INTRAVENOUS | Status: DC | PRN
  Filled 2021-05-22: qty 10

## 2021-05-22 MED ORDER — SODIUM CHLORIDE 0.9 % IV SOLN
Freq: Once | INTRAVENOUS | Status: AC
  Filled 2021-05-22: qty 250

## 2021-05-22 MED ORDER — HEPARIN SOD (PORK) LOCK FLUSH 100 UNIT/ML IV SOLN
INTRAVENOUS | Status: AC
  Filled 2021-05-22: qty 5

## 2021-05-22 NOTE — Patient Instructions (Signed)
[facility]  Discharge Instructions: Thank you for choosing [facility] to provide your oncology and hematology care.  If you have a lab appointment with the [facility], please go directly to the [facility] and check in at the registration area.  Wear comfortable clothing and clothing appropriate for easy access to any Portacath or PICC line.   We strive to give you quality time with your provider. You may need to reschedule your appointment if you arrive late (15 or more minutes).  Arriving late affects you and other patients whose appointments are after yours.  Also, if you miss three or more appointments without notifying the office, you may be dismissed from the clinic at the provider's discretion.      For prescription refill requests, have your pharmacy contact our office and allow 72 hours for refills to be completed.    Today you received the following chemotherapy and/or immunotherapy agents - durvalumab      To help prevent nausea and vomiting after your treatment, we encourage you to take your nausea medication as directed.  BELOW ARE SYMPTOMS THAT SHOULD BE REPORTED IMMEDIATELY: *FEVER GREATER THAN 100.4 F (38 C) OR HIGHER *CHILLS OR SWEATING *NAUSEA AND VOMITING THAT IS NOT CONTROLLED WITH YOUR NAUSEA MEDICATION *UNUSUAL SHORTNESS OF BREATH *UNUSUAL BRUISING OR BLEEDING *URINARY PROBLEMS (pain or burning when urinating, or frequent urination) *BOWEL PROBLEMS (unusual diarrhea, constipation, pain near the anus) TENDERNESS IN MOUTH AND THROAT WITH OR WITHOUT PRESENCE OF ULCERS (sore throat, sores in mouth, or a toothache) UNUSUAL RASH, SWELLING OR PAIN  UNUSUAL VAGINAL DISCHARGE OR ITCHING   Items with * indicate a potential emergency and should be followed up as soon as possible or go to the Emergency Department if any problems should occur.  Please show the CHEMOTHERAPY ALERT CARD or IMMUNOTHERAPY ALERT CARD at  check-in to the Emergency Department and triage nurse.  Should you have questions after your visit or need to cancel or reschedule your appointment, please contact [facility]  [phone] and follow the prompts.  Office hours are 8:00 a.m. to 4:30 p.m. Monday - Friday. Please note that voicemails left after 4:00 p.m. may not be returned until the following business day.  We are closed weekends and major holidays. You have access to a nurse at all times for urgent questions. Please call the main number to the clinic [phone] and follow the prompts.  For any non-urgent questions, you may also contact your provider using MyChart. We now offer e-Visits for anyone 63 and older to request care online for non-urgent symptoms. For details visit [email or web address].   Also download the MyChart app! Go to the app store, search "MyChart", open the app, select , and log in with your MyChart username and password.  Due to Covid, a mask is required upon entering the hospital/clinic. If you do not have a mask, one will be given to you upon arrival. For doctor visits, patients may have 1 support person aged 20 or older with them. For treatment visits, patients cannot have anyone with them due to current Covid guidelines and our immunocompromised population.   Durvalumab injection What is this medication? DURVALUMAB (dur VAL ue mab) is a monoclonal antibody. It is used to treat lung cancer. This medicine may be used for other purposes; ask your health care provider or pharmacist if you have questions. COMMON BRAND NAME(S): [email or web address] What should I tell my care team before I  take this medication? They need to know if you have any of these conditions: autoimmune diseases like Crohn's disease, ulcerative colitis, or lupus have had or planning to have an allogeneic stem cell transplant (uses someone else's stem cells) history of organ transplant history of  radiation to the chest nervous system problems like myasthenia gravis or Guillain-Barre syndrome an unusual or allergic reaction to durvalumab, other medicines, foods, dyes, or preservatives pregnant or trying to get pregnant breast-feeding How should I use this medication? This medicine is for infusion into a vein. It is given by a health care professional in a hospital or clinic setting. A special MedGuide will be given to you before each treatment. Be sure to read this information carefully each time. Talk to your pediatrician regarding the use of this medicine in children. Special care may be needed. Overdosage: If you think you have taken too much of this medicine contact a poison control center or emergency room at once. NOTE: This medicine is only for you. Do not share this medicine with others. What if I miss a dose? It is important not to miss your dose. Call your doctor or health care professional if you are unable to keep an appointment. What may interact with this medication? Interactions have not been studied. This list may not describe all possible interactions. Give your health care provider a list of all the medicines, herbs, non-prescription drugs, or dietary supplements you use. Also tell them if you smoke, drink alcohol, or use illegal drugs. Some items may interact with your medicine. What should I watch for while using this medication? This drug may make you feel generally unwell. Continue your course of treatment even though you feel ill unless your doctor tells you to stop. You may need blood work done while you are taking this medicine. Do not become pregnant while taking this medicine or for 3 months after stopping it. Women should inform their doctor if they wish to become pregnant or think they might be pregnant. There is a potential for serious side effects to an unborn child. Talk to your health care professional or pharmacist for more information. Do not breast-feed  an infant while taking this medicine or for 3 months after stopping it. What side effects may I notice from receiving this medication? Side effects that you should report to your doctor or health care professional as soon as possible: allergic reactions like skin rash, itching or hives, swelling of the face, lips, or tongue black, tarry stools bloody or watery diarrhea breathing problems change in emotions or moods change in sex drive changes in vision chest pain or chest tightness chills confusion cough facial flushing fever headache signs and symptoms of high blood sugar such as dizziness; dry mouth; dry skin; fruity breath; nausea; stomach pain; increased hunger or thirst; increased urination signs and symptoms of liver injury like dark yellow or brown urine; general ill feeling or flu-like symptoms; light-colored stools; loss of appetite; nausea; right upper belly pain; unusually weak or tired; yellowing of the eyes or skin stomach pain trouble passing urine or change in the amount of urine weight gain or weight loss Side effects that usually do not require medical attention (report these to your doctor or health care professional if they continue or are bothersome): bone pain constipation loss of appetite muscle pain nausea swelling of the ankles, feet, hands tiredness This list may not describe all possible side effects. Call your doctor for medical advice about side effects. You may report  side effects to FDA at 1-800-FDA-1088. Where should I keep my medication? This drug is given in a hospital or clinic and will not be stored at home. NOTE: This sheet is a summary. It may not cover all possible information. If you have questions about this medicine, talk to your doctor, pharmacist, or health care provider.  2022 Elsevier/Gold Standard (2019-09-27 13:01:29)

## 2021-05-22 NOTE — Progress Notes (Signed)
Pt wants to know if there is anything she can take to grow her hair back or will want to know if it is going to grow back.

## 2021-05-22 NOTE — Progress Notes (Signed)
Radiation Oncology Follow up Note  Name: Katherine Perry   Date:   05/22/2021 MRN:  [id number] DOB: [birth year]    This 65 y.o. female presents to the clinic today for s 11-month follow-up status post concurrent chemoradiation therapy for stage III (T3 N1 M0) adenocarcinoma the right lung  REFERRING PROVIDER: Rusty Aus, MD  HPI: Patient is a 65 year old female now at 4 months having completed concurrent chemoradiation therapy for stage III adenocarcinoma of the right lower lobe.  Seen today in follow-up she is doing well.  She specifically denies dysphagia cough hemoptysis or chest tightness.  She is currently on maintenance durvalumab.  She is recently been treated empirically for some and pneumonia type symptoms.  She has not been imaged since late August did have some mixed groundglass opacity airspace consolidation suspicious for pneumonia.  The right lower lobe mass with known bronchogenic carcinoma was grossly stable.  COMPLICATIONS OF TREATMENT: none  FOLLOW UP COMPLIANCE: keeps appointments   PHYSICAL EXAM:  BP 134/73   Pulse 89   Temp (!) 97.1 F (36.2 C) (Tympanic)   Resp 16   Wt 142 lb 4.8 oz (64.5 kg)   BMI 23.68 kg/m  Well-developed well-nourished patient in NAD. HEENT reveals PERLA, EOMI, discs not visualized.  Oral cavity is clear. No oral mucosal lesions are identified. Neck is clear without evidence of cervical or supraclavicular adenopathy. Lungs are clear to A&P. Cardiac examination is essentially unremarkable with regular rate and rhythm without murmur rub or thrill. Abdomen is benign with no organomegaly or masses noted. Motor sensory and DTR levels are equal and symmetric in the upper and lower extremities. Cranial nerves II through XII are grossly intact. Proprioception is intact. No peripheral adenopathy or edema is identified. No motor or sensory levels are noted. Crude visual fields are within normal range.  RADIOLOGY RESULTS: CT scan reviewed compatible with  above-stated findings  PLAN: Present time patient is stable currently on maintenance durvalumab.  I am pleased with her overall progress.  She continues follow-up and treatment with medical oncology.  I have asked to see her back in 4 months for follow-up.  Anticipate another CT scan in that time period.  Patient knows to call with any concerns.  I would like to take this opportunity to thank you for allowing me to participate in the care of your patient.Noreene Filbert, MD [place]

## 2021-05-22 NOTE — Progress Notes (Signed)
Nutrition Follow-up:  Patient with stage III lung cancer.  Patient has completed concurrent chemotherapy and radiation.  Patient on immunotherapy currently.  Met with patient during infusion.  Patient reports that her appetite is good.  She has started drinking ensure original each morning at breakfast.  Likes the chocolate and drinks it with a straw.  Eating lunch and dinner consistently.  Had steak biscuit this am before treatment.    Denies any nutrition impact symptoms    Medications: reviewed  Labs: reviewed  Anthropometrics:   Weight 142 lb today, stable  142 lb on 9/23 145 lb on 9/9 150 lb on 6/27 157 lb on 5/24 159 lb on 5/17   NUTRITION DIAGNOSIS: Inadequate oral intake improving   INTERVENTION:  Continue ensure plus daily at breakfast Coupons given. Encouraged patient to continue eating good sources of protein and well balanced diet    MONITORING, EVALUATION, GOAL: weight trends, intake   NEXT VISIT: Friday, Nov 4 th during infusion  Turner Baillie B. Zenia Resides, [place], [place] Registered Dietitian [phone] (mobile)

## 2021-05-24 ENCOUNTER — Other Ambulatory Visit: Payer: Self-pay | Admitting: Oncology

## 2021-05-24 DIAGNOSIS — E538 Deficiency of other specified B group vitamins: Secondary | ICD-10-CM

## 2021-05-25 ENCOUNTER — Encounter: Payer: Self-pay | Admitting: Oncology

## 2021-05-31 ENCOUNTER — Encounter: Payer: Self-pay | Admitting: Oncology

## 2021-05-31 NOTE — Progress Notes (Signed)
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  She quit smoking about 26 years ago.  Other medical problems include hypertension and hyponatremia.She has been treated for iron of possible pneumonia for the last 1 year.  She has a history of intermittent asthma for which she uses Advair.  She was seen by Dr. And underwent CT chest which showed dense infiltrate/solid mass in the right lower lobe with contiguous airspace opacity in the right upper lobe.  Findings could be secondary to pneumonia but concerning for bronchogenic carcinoma.  Patient then had a PET CT scan which showed consolidation in the medial aspect of the right lower lobe measuring 3.4 x 4 cm with an SUV of 11.6.  Hypermetabolic masslike thickening in the right hilum measuring 2.2 cm with intense hypermetabolic activity.  Groundglass airspace consolidation in the posterior aspect of the right upper lobe with an SUV of 11.4.  The right upper lobe opacity was nonspecific and differentials include pulmonary infection versus lymphangitic spread of carcinoma.  CT chest showed showed masslike area of distortion involving right lower lobe middle lobe as well as right upper lobe.  Generalized right hilar masslike appearance concerning for nodal involvement.   Patient underwent bronchoscopy and biopsies.Right lower lobe was concerning for adenocarcinoma.  Lymph node station 11  R, 4R, 7 were negative for malignancy.  No malignant cells identified in the right upper lobe.  Case was discussed at tumor board and given the hypermetabolism in the right upper lobe as well as hilum involvement cannot be ruled out despite negative biopsies.  Patient completed concurrent chemoradiation with weekly CarboTaxol in July 2022.  Scan showed partial response.  Maintenance durvalumab started on 02/27/2021    Interval history-reports that her cough and wheezing is better.  She is not coughing as frequently anymore.  Denies any fever or sputum production.  ECOG PS- 1 Pain scale- 0   Review of systems- Review of Systems  Constitutional:  Positive for malaise/fatigue. Negative for chills, fever and weight loss.  HENT:  Negative for congestion, ear discharge and nosebleeds.   Eyes:  Negative for blurred vision.  Respiratory:  Positive for cough. Negative for hemoptysis, sputum production, shortness of breath and wheezing.   Cardiovascular:  Negative for chest pain, palpitations, orthopnea and claudication.  Gastrointestinal:  Negative for abdominal pain, blood in stool, constipation, diarrhea, heartburn, melena, nausea and vomiting.  Genitourinary:  Negative for dysuria, flank pain, frequency, hematuria and urgency.  Musculoskeletal:  Negative for back pain, joint pain and myalgias.  Skin:  Negative for rash.  Neurological:  Negative for dizziness, tingling, focal weakness, seizures, weakness and headaches.  Endo/Heme/Allergies:  Does not bruise/bleed easily.  Psychiatric/Behavioral:  Negative for depression and suicidal ideas. The patient does not have insomnia.      No Known Allergies   Past Medical History:  Diagnosis Date   Anxiety    Asthma    Cough    Dyspnea    with  exertion   GERD (gastroesophageal reflux disease)    Hypertension      Past Surgical History:  Procedure Laterality Date   BUNIONECTOMY Bilateral    COLONOSCOPY     PORTA CATH INSERTION N/A 12/22/2020    Procedure: PORTA CATH INSERTION;  Surgeon: Algernon Huxley, MD;  Location: [place] [facility];  Service: Cardiovascular;  Laterality: N/A;   VIDEO BRONCHOSCOPY WITH ENDOBRONCHIAL NAVIGATION N/A 11/07/2020   Procedure: VIDEO BRONCHOSCOPY WITH ENDOBRONCHIAL NAVIGATION;  Surgeon: Ottie Glazier, MD;  Location: [facility];  Service: Thoracic;  Laterality: N/A;   VIDEO BRONCHOSCOPY WITH ENDOBRONCHIAL ULTRASOUND N/A 11/07/2020   Procedure: VIDEO BRONCHOSCOPY WITH ENDOBRONCHIAL ULTRASOUND;  Surgeon: Ottie Glazier, MD;  Location: [facility];  Service: Thoracic;  Laterality: N/A;    Social History   Socioeconomic History   Marital status: Married    Spouse name: Not on file   Number of children: Not on file   Years of education: Not on file   Highest education level: Not on file  Occupational History   Not on file  Tobacco Use   Smoking status: Former    Packs/day: 1.00    Years: 15.00    Pack years: 15.00    Types: Cigarettes    Quit date: 11/07/1994    Years since quitting: 26.5   Smokeless tobacco: Never  Vaping Use   Vaping Use: Never used  Substance and Sexual Activity   Alcohol use: Yes    Comment:  wine daily   Drug use: Never   Sexual activity: Not on file  Other Topics Concern   Not on file  Social History Narrative   Not on file   Social Determinants of Health   Financial Resource Strain: Not on file  Food Insecurity: Not on file  Transportation Needs: Not on file  Physical Activity: Not on file  Stress: Not on file  Social Connections: Not on file  Intimate Partner Violence: Not on file    History reviewed. No pertinent family history.   Current Outpatient Medications:    acetaminophen (TYLENOL) 500 MG tablet, Take 500 mg by mouth every 6 (six) hours as needed., Disp: , Rfl:    ADVAIR DISKUS 250-50 MCG/DOSE AEPB, Inhale 1 puff into the lungs as needed., Disp: , Rfl:    ALPRAZolam (XANAX) 0.5 MG tablet, Take 0.5 mg by mouth at bedtime as needed for sleep., Disp: ,  Rfl:    amLODipine (NORVASC) 5 MG tablet, Take 5 mg by mouth at bedtime., Disp: , Rfl:    amoxicillin (AMOXIL) 875 MG tablet, SMARTSIG:1 Tablet(s) By Mouth Every 12 Hours, Disp: , Rfl:    azelastine (ASTELIN) 0.1 % nasal spray, Place 2 sprays into both nostrils 2 (two) times daily. Use in each nostril as directed, Disp: 30 mL, Rfl: 2   Cholecalciferol 50 MCG (2000 UT) CAPS, Take 2,000 Units by mouth daily., Disp: , Rfl:    docusate sodium (COLACE) 100 MG capsule, Take 100 mg by mouth daily., Disp: , Rfl:    esomeprazole (NEXIUM) 20 MG capsule, Take 20 mg by mouth as needed., Disp: , Rfl:    fluticasone (FLONASE) 50 MCG/ACT nasal spray, Place 2 sprays into both nostrils daily as needed for rhinitis., Disp: , Rfl:    fluticasone (FLONASE) 50 MCG/ACT nasal spray, Place into the nose., Disp: , Rfl:    gabapentin (NEURONTIN) 300 MG capsule, Take 1 capsule (300 mg total) by mouth 3 (three) times daily. Start with 1 capsule nightly for 2 days,  then 2 capsules after 2 days -1 in am, 1 at night for 1 week, then 1 tablet three times a day, Disp: 69 capsule, Rfl: 0   metoprolol succinate (TOPROL-XL) 50 MG 24 hr tablet, Take 50 mg by mouth every morning., Disp: , Rfl:    zolpidem (AMBIEN) 10 MG tablet, Take 10 mg by mouth at bedtime as needed for sleep., Disp: , Rfl:    folic acid (FOLVITE) 1 MG tablet, TAKE 1 TABLET BY MOUTH EVERY DAY, Disp: 90 tablet, Rfl: 3 No current facility-administered medications for this visit.  Facility-Administered Medications Ordered in Other Visits:    heparin lock flush 100 UNIT/ML injection, , , ,    heparin lock flush 100 UNIT/ML injection, , , ,    sodium chloride flush (NS) 0.9 % injection 10 mL, 10 mL, Intravenous, Once, Sindy Guadeloupe, MD   sodium chloride flush (NS) 0.9 % injection 10 mL, 10 mL, Intravenous, PRN, Sindy Guadeloupe, MD, 10 mL at 01/26/21 0807  Physical exam:  Vitals:   05/22/21 0919  BP: 134/73  Pulse: 89  Resp: 16  Temp: (!) 97.1 F (36.2 C)   Weight: 142 lb 6.4 oz (64.6 kg)   Physical Exam Constitutional:      General: She is not in acute distress. Cardiovascular:     Rate and Rhythm: Normal rate and regular rhythm.     Heart sounds: Normal heart sounds.  Pulmonary:     Effort: Pulmonary effort is normal.     Comments: Mild scattered bilateral rhonchi overall improved as compared to last visit Abdominal:     General: Bowel sounds are normal.     Palpations: Abdomen is soft.  Skin:    General: Skin is warm and dry.  Neurological:     Mental Status: She is alert and oriented to person, place, and time.     CMP Latest Ref Rng & Units 05/22/2021  Glucose 70 - 99 mg/dL 118(H)  BUN 8 - 23 mg/dL 10  Creatinine 0.44 - 1.00 mg/dL 0.66  Sodium 135 - 145 mmol/L 135  Potassium 3.5 - 5.1 mmol/L 3.8  Chloride 98 - 111 mmol/L 100  CO2 22 - 32 mmol/L 26  Calcium 8.9 - 10.3 mg/dL 9.3  Total Protein 6.5 - 8.1 g/dL 7.8  Total Bilirubin 0.3 - 1.2 mg/dL 0.5  Alkaline Phos 38 - 126 U/L 61  AST 15 - 41 U/L 25  ALT 0 - 44 U/L 19   CBC Latest Ref Rng & Units 05/22/2021  WBC 4.0 - 10.5 K/uL 8.7  Hemoglobin 12.0 - 15.0 g/dL 11.2(L)  Hematocrit 36.0 - 46.0 % 34.3(L)  Platelets 150 - 400 K/uL 314    Assessment and plan- Patient is a 65 y.o. female with adenocarcinoma of the right lung clinical stage III acT3 N1 M0.  She is here for on treatment assessment prior to cycle 6 of maintenance durvalumab  Counts okay to proceed with cycle 6 of maintenance durvalumab today.See her back in 2 weeks  Overall she is tolerating treatment well without any significant side effects.  I will see her back in 2 weeks for cycle 8.  Repeat scans due next month.   Visit Diagnosis 1. Encounter for antineoplastic immunotherapy   2. Malignant neoplasm of lower lobe of right lung ([place])      Dr. Randa Evens, MD, MPH [facility] at [facility] [phone] 05/31/2021 8:28 AM

## 2021-06-01 ENCOUNTER — Ambulatory Visit: Payer: BC Managed Care – PPO | Admitting: Radiation Oncology

## 2021-06-05 ENCOUNTER — Inpatient Hospital Stay: Payer: BC Managed Care – PPO

## 2021-06-05 ENCOUNTER — Inpatient Hospital Stay: Payer: BC Managed Care – PPO | Attending: Oncology

## 2021-06-05 ENCOUNTER — Other Ambulatory Visit: Payer: Self-pay

## 2021-06-05 ENCOUNTER — Encounter: Payer: Self-pay | Admitting: Oncology

## 2021-06-05 ENCOUNTER — Inpatient Hospital Stay (HOSPITAL_BASED_OUTPATIENT_CLINIC_OR_DEPARTMENT_OTHER): Payer: BC Managed Care – PPO | Admitting: Oncology

## 2021-06-05 VITALS — BP 127/63 | HR 88 | Temp 96.8°F | Resp 16 | Wt 143.0 lb

## 2021-06-05 DIAGNOSIS — C3431 Malignant neoplasm of lower lobe, right bronchus or lung: Secondary | ICD-10-CM | POA: Diagnosis present

## 2021-06-05 DIAGNOSIS — Z5112 Encounter for antineoplastic immunotherapy: Secondary | ICD-10-CM | POA: Diagnosis present

## 2021-06-05 DIAGNOSIS — Z79899 Other long term (current) drug therapy: Secondary | ICD-10-CM | POA: Diagnosis not present

## 2021-06-05 DIAGNOSIS — J0100 Acute maxillary sinusitis, unspecified: Secondary | ICD-10-CM | POA: Diagnosis not present

## 2021-06-05 LAB — COMPREHENSIVE METABOLIC PANEL
ALT: 15 U/L (ref 0–44)
AST: 19 U/L (ref 15–41)
Albumin: 3.8 g/dL (ref 3.5–5.0)
Alkaline Phosphatase: 70 U/L (ref 38–126)
Anion gap: 9 (ref 5–15)
BUN: 9 mg/dL (ref 8–23)
CO2: 26 mmol/L (ref 22–32)
Calcium: 9.1 mg/dL (ref 8.9–10.3)
Chloride: 97 mmol/L — ABNORMAL LOW (ref 98–111)
Creatinine, Ser: 0.57 mg/dL (ref 0.44–1.00)
GFR, Estimated: >60 mL/min (ref >60)
Glucose, Bld: 94 mg/dL (ref 70–99)
Potassium: 3.9 mmol/L (ref 3.5–5.1)
Sodium: 132 mmol/L — ABNORMAL LOW (ref 135–145)
Total Bilirubin: 0.2 mg/dL — ABNORMAL LOW (ref 0.3–1.2)
Total Protein: 7.6 g/dL (ref 6.5–8.1)

## 2021-06-05 LAB — CBC WITH DIFFERENTIAL/PLATELET
Abs Immature Granulocytes: 0.02 10*3/uL (ref 0.00–0.07)
Basophils Absolute: 0 10*3/uL (ref 0.0–0.1)
Basophils Relative: 1 %
Eosinophils Absolute: 0.2 10*3/uL (ref 0.0–0.5)
Eosinophils Relative: 5 %
HCT: 31.8 % — ABNORMAL LOW (ref 36.0–46.0)
Hemoglobin: 10.5 g/dL — ABNORMAL LOW (ref 12.0–15.0)
Immature Granulocytes: 1 %
Lymphocytes Relative: 21 %
Lymphs Abs: 0.8 10*3/uL (ref 0.7–4.0)
MCH: 32.9 pg (ref 26.0–34.0)
MCHC: 33 g/dL (ref 30.0–36.0)
MCV: 99.7 fL (ref 80.0–100.0)
Monocytes Absolute: 0.4 10*3/uL (ref 0.1–1.0)
Monocytes Relative: 11 %
Neutro Abs: 2.5 10*3/uL (ref 1.7–7.7)
Neutrophils Relative %: 61 %
Platelets: 249 10*3/uL (ref 150–400)
RBC: 3.19 MIL/uL — ABNORMAL LOW (ref 3.87–5.11)
RDW: 14.1 % (ref 11.5–15.5)
WBC: 4 10*3/uL (ref 4.0–10.5)
nRBC: 0 % (ref 0.0–0.2)

## 2021-06-05 MED ORDER — SODIUM CHLORIDE 0.9% FLUSH
10.0000 mL | INTRAVENOUS | Status: DC | PRN
  Filled 2021-06-05: qty 10

## 2021-06-05 MED ORDER — HEPARIN SOD (PORK) LOCK FLUSH 100 UNIT/ML IV SOLN
500.0000 [IU] | Freq: Once | INTRAVENOUS | Status: AC | PRN
  Administered 2021-06-05: 500 [IU]
  Filled 2021-06-05: qty 5

## 2021-06-05 MED ORDER — SODIUM CHLORIDE 0.9 % IV SOLN
Freq: Once | INTRAVENOUS | Status: AC
  Filled 2021-06-05: qty 250

## 2021-06-05 MED ORDER — HEPARIN SOD (PORK) LOCK FLUSH 100 UNIT/ML IV SOLN
INTRAVENOUS | Status: AC
  Filled 2021-06-05: qty 5

## 2021-06-05 MED ORDER — SODIUM CHLORIDE 0.9 % IV SOLN
10.0000 mg/kg | Freq: Once | INTRAVENOUS | Status: AC
  Administered 2021-06-05: 620 mg via INTRAVENOUS
  Filled 2021-06-05: qty 10

## 2021-06-05 NOTE — Progress Notes (Signed)
Pt states she has been dealing with head congestion, and sinus, has been taking tylenol sinus but is not helping. Took a round of antibiotics but sx are back again.

## 2021-06-05 NOTE — Progress Notes (Signed)
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  She quit smoking about 26 years ago.  Other medical problems include hypertension and hyponatremia.She has been treated for iron of possible pneumonia for the last 1 year.  She has a history of intermittent asthma for which she uses Advair.  She was seen by Dr. And underwent CT chest which showed dense infiltrate/solid mass in the right lower lobe with contiguous airspace opacity in the right upper lobe.  Findings could be secondary to pneumonia but concerning for bronchogenic carcinoma.  Patient then had a PET CT scan which showed consolidation in the medial aspect of the right lower lobe measuring 3.4 x 4 cm with an SUV of 11.6.  Hypermetabolic masslike thickening in the right hilum measuring 2.2 cm with intense hypermetabolic activity.  Groundglass airspace consolidation in the posterior aspect of the right upper lobe with an SUV of 11.4.  The right upper lobe opacity was nonspecific and differentials include pulmonary infection versus lymphangitic spread of carcinoma.  CT chest showed showed masslike area of distortion involving right lower lobe middle lobe as well as right upper lobe.  Generalized right hilar masslike appearance concerning for nodal involvement.   Patient underwent bronchoscopy and biopsies.Right lower lobe was concerning for adenocarcinoma.  Lymph node station  11 R, 4R, 7 were negative for malignancy.  No malignant cells identified in the right upper lobe.  Case was discussed at tumor board and given the hypermetabolism in the right upper lobe as well as hilum involvement cannot be ruled out despite negative biopsies.  Patient completed concurrent chemoradiation with weekly CarboTaxol in July 2022.  Scan showed partial response.  Maintenance durvalumab started on 02/27/2021    Interval history-patient has been having persistent right maxillary pain and nasal congestion although she is not having any significant nasal discharge.  She has persistent cough which is overall improved as compared to before.  Denies any fever  ECOG PS- 1 Pain scale- 0   Review of systems- Review of Systems  Constitutional:  Positive for malaise/fatigue. Negative for chills, fever and weight loss.  HENT:  Positive for sinus pain. Negative for congestion, ear discharge and nosebleeds.   Eyes:  Negative for blurred vision.  Respiratory:  Positive for cough. Negative for hemoptysis, sputum production, shortness of breath and wheezing.   Cardiovascular:  Negative for chest pain, palpitations, orthopnea and claudication.  Gastrointestinal:  Negative for abdominal pain, blood in stool, constipation, diarrhea, heartburn, melena, nausea and vomiting.  Genitourinary:  Negative for dysuria, flank pain, frequency, hematuria and urgency.  Musculoskeletal:  Negative for back pain, joint pain and myalgias.  Skin:  Negative for rash.  Neurological:  Negative for dizziness, tingling, focal weakness, seizures, weakness and headaches.  Endo/Heme/Allergies:  Does not bruise/bleed easily.  Psychiatric/Behavioral:  Negative for depression and suicidal ideas. The patient does not have insomnia.      No Known Allergies   Past Medical History:  Diagnosis  Date   Anxiety    Asthma    Cough    Dyspnea    with exertion   GERD (gastroesophageal reflux disease)    Hypertension      Past  Surgical History:  Procedure Laterality Date   BUNIONECTOMY Bilateral    COLONOSCOPY     PORTA CATH INSERTION N/A 12/22/2020   Procedure: PORTA CATH INSERTION;  Surgeon: Algernon Huxley, MD;  Location: [place] [facility];  Service: Cardiovascular;  Laterality: N/A;   VIDEO BRONCHOSCOPY WITH ENDOBRONCHIAL NAVIGATION N/A 11/07/2020   Procedure: VIDEO BRONCHOSCOPY WITH ENDOBRONCHIAL NAVIGATION;  Surgeon: Ottie Glazier, MD;  Location: [facility];  Service: Thoracic;  Laterality: N/A;   VIDEO BRONCHOSCOPY WITH ENDOBRONCHIAL ULTRASOUND N/A 11/07/2020   Procedure: VIDEO BRONCHOSCOPY WITH ENDOBRONCHIAL ULTRASOUND;  Surgeon: Ottie Glazier, MD;  Location: [facility];  Service: Thoracic;  Laterality: N/A;    Social History   Socioeconomic History   Marital status: Married    Spouse name: Not on file   Number of children: Not on file   Years of education: Not on file   Highest education level: Not on file  Occupational History   Not on file  Tobacco Use   Smoking status: Former    Packs/day: 1.00    Years: 15.00    Pack years: 15.00    Types: Cigarettes    Quit date: 11/07/1994    Years since quitting: 26.5   Smokeless tobacco: Never  Vaping Use   Vaping Use: Never used  Substance and Sexual Activity   Alcohol use: Yes    Comment:  wine daily   Drug use: Never   Sexual activity: Not on file  Other Topics Concern   Not on file  Social History Narrative   Not on file   Social Determinants of Health   Financial Resource Strain: Not on file  Food Insecurity: Not on file  Transportation Needs: Not on file  Physical Activity: Not on file  Stress: Not on file  Social Connections: Not on file  Intimate Partner Violence: Not on file    History reviewed. No pertinent family history.   Current Outpatient Medications:    acetaminophen (TYLENOL) 500 MG tablet, Take 500 mg by mouth every 6 (six) hours as needed., Disp: , Rfl:    ADVAIR DISKUS 250-50 MCG/DOSE AEPB, Inhale 1 puff into the  lungs as needed., Disp: , Rfl:    ALPRAZolam (XANAX) 0.5 MG tablet, Take 0.5 mg by mouth at bedtime as needed for sleep., Disp: , Rfl:    amLODipine (NORVASC) 5 MG tablet, Take 5 mg by mouth at bedtime., Disp: , Rfl:    azelastine (ASTELIN) 0.1 % nasal spray, Place 2 sprays into both nostrils 2 (two) times daily. Use in each nostril as directed, Disp: 30 mL, Rfl: 2   Cholecalciferol 50 MCG (2000 UT) CAPS, Take 2,000 Units by mouth daily., Disp: , Rfl:    docusate sodium (COLACE) 100 MG capsule, Take 100 mg by mouth daily., Disp: , Rfl:    esomeprazole (NEXIUM) 20 MG capsule, Take 20 mg by mouth as needed., Disp: , Rfl:    folic acid (FOLVITE) 1 MG tablet, TAKE 1 TABLET BY MOUTH EVERY DAY, Disp: 90 tablet, Rfl: 3   gabapentin (NEURONTIN) 300 MG capsule, Take 1 capsule (300 mg total) by mouth 3 (three) times daily. Start with 1 capsule nightly for 2 days, then 2 capsules after 2 days -1 in am, 1 at night for 1 week, then 1  tablet three times a day, Disp: 69 capsule, Rfl: 0   metoprolol succinate (TOPROL-XL) 50 MG 24 hr tablet, Take 50 mg by mouth every morning., Disp: , Rfl:    zolpidem (AMBIEN) 10 MG tablet, Take 10 mg by mouth at bedtime as needed for sleep., Disp: , Rfl:    fluticasone (FLONASE) 50 MCG/ACT nasal spray, Place 2 sprays into both nostrils daily as needed for rhinitis. (Patient not taking: Reported on 06/05/2021), Disp: , Rfl:    fluticasone (FLONASE) 50 MCG/ACT nasal spray, Place into the nose. (Patient not taking: Reported on 06/05/2021), Disp: , Rfl:  No current facility-administered medications for this visit.  Facility-Administered Medications Ordered in Other Visits:    heparin lock flush 100 UNIT/ML injection, , , ,    heparin lock flush 100 UNIT/ML injection, , , ,    heparin lock flush 100 UNIT/ML injection, , , ,    heparin lock flush 100 unit/mL, 500 Units, Intracatheter, Once PRN, Sindy Guadeloupe, MD   sodium chloride flush (NS) 0.9 % injection 10 mL, 10 mL, Intravenous,  Once, Sindy Guadeloupe, MD   sodium chloride flush (NS) 0.9 % injection 10 mL, 10 mL, Intravenous, PRN, Sindy Guadeloupe, MD, 10 mL at 01/26/21 0807   sodium chloride flush (NS) 0.9 % injection 10 mL, 10 mL, Intracatheter, PRN, Sindy Guadeloupe, MD  Physical exam:  Vitals:   06/05/21 1045  BP: 127/63  Pulse: 88  Resp: 16  Temp: (!) 96.8 F (36 C)  SpO2: 99%  Weight: 143 lb (64.9 kg)   Physical Exam Constitutional:      General: She is not in acute distress. HENT:     Head:     Comments: Tenderness to palpation over right maxillary sinus Cardiovascular:     Rate and Rhythm: Normal rate and regular rhythm.     Heart sounds: Normal heart sounds.  Pulmonary:     Effort: Pulmonary effort is normal.     Breath sounds: Normal breath sounds.  Abdominal:     General: Bowel sounds are normal.     Palpations: Abdomen is soft.  Skin:    General: Skin is warm and dry.  Neurological:     Mental Status: She is alert and oriented to person, place, and time.     CMP Latest Ref Rng & Units 06/05/2021  Glucose 70 - 99 mg/dL 94  BUN 8 - 23 mg/dL 9  Creatinine 0.44 - 1.00 mg/dL 0.57  Sodium 135 - 145 mmol/L 132(L)  Potassium 3.5 - 5.1 mmol/L 3.9  Chloride 98 - 111 mmol/L 97(L)  CO2 22 - 32 mmol/L 26  Calcium 8.9 - 10.3 mg/dL 9.1  Total Protein 6.5 - 8.1 g/dL 7.6  Total Bilirubin 0.3 - 1.2 mg/dL 0.2(L)  Alkaline Phos 38 - 126 U/L 70  AST 15 - 41 U/L 19  ALT 0 - 44 U/L 15   CBC Latest Ref Rng & Units 06/05/2021  WBC 4.0 - 10.5 K/uL 4.0  Hemoglobin 12.0 - 15.0 g/dL 10.5(L)  Hematocrit 36.0 - 46.0 % 31.8(L)  Platelets 150 - 400 K/uL 249     Assessment and plan- Patient is a 65 y.o. female with adenocarcinoma of the right lung clinical stage III acT3 N1 M0.  She is here for on treatment assessment prior to cycle 7 of maintenance durvalumab  Counseled to proceed with cycle 7 of maintenance durvalumab today and I will see her back in 2 weeks for cycle 8.We will plan  to repeat scans after 8  cycles.  Possible maxillary sinusitis: Patient has already completed a course of antibiotic over 10 days ago and still has persistent symptoms including right maxillary pain and tenderness.  I will refer her to ENT for the same.   Visit Diagnosis 1. Encounter for antineoplastic immunotherapy   2. Malignant neoplasm of lower lobe of right lung ([place])      Dr. Randa Evens, MD, MPH [facility] at [facility] [phone] 06/05/2021 1:16 PM

## 2021-06-05 NOTE — Progress Notes (Addendum)
Nutrition Follow-up:  Patient with stage III lung cancer.  Patient has completed concurrent chemotherapy and radiation.  Patient on immunotherapy.  Met with patient during infusion.  Patient reports that appetite is good.  Drinking ensure at breakfast and eating good lunch and dinner meals.    Denies any nutrition impact symptoms    Medications: reviewed  Labs: reviewed  Anthropometrics:   Weight 143 lb  142 lb on 10/21 142 lb on 9/23 145 lb on 9/9 150 lb on 6/27 157 lb on 5/24 159 lb on 5/17 159 lb on 5/17   NUTRITION DIAGNOSIS: Inadequate oral intake stable   INTERVENTION:  Continue ensure plus daily Patient to continue eating lean protein and adding plant foods in diet.     MONITORING, EVALUATION, GOAL: weight trends, intake   NEXT VISIT: ~4 weeks with treatment  Katherine Perry B. Zenia Resides, [place], [place] Registered Dietitian [phone] (mobile)

## 2021-06-05 NOTE — Patient Instructions (Signed)
[facility]  Discharge Instructions: Thank you for choosing [facility] to provide your oncology and hematology care.  If you have a lab appointment with the [facility], please go directly to the [facility] and check in at the registration area.  Wear comfortable clothing and clothing appropriate for easy access to any Portacath or PICC line.   We strive to give you quality time with your provider. You may need to reschedule your appointment if you arrive late (15 or more minutes).  Arriving late affects you and other patients whose appointments are after yours.  Also, if you miss three or more appointments without notifying the office, you may be dismissed from the clinic at the provider's discretion.      For prescription refill requests, have your pharmacy contact our office and allow 72 hours for refills to be completed.    Today you received the following chemotherapy and/or immunotherapy agents -durvalumab      To help prevent nausea and vomiting after your treatment, we encourage you to take your nausea medication as directed.  BELOW ARE SYMPTOMS THAT SHOULD BE REPORTED IMMEDIATELY: *FEVER GREATER THAN 100.4 F (38 C) OR HIGHER *CHILLS OR SWEATING *NAUSEA AND VOMITING THAT IS NOT CONTROLLED WITH YOUR NAUSEA MEDICATION *UNUSUAL SHORTNESS OF BREATH *UNUSUAL BRUISING OR BLEEDING *URINARY PROBLEMS (pain or burning when urinating, or frequent urination) *BOWEL PROBLEMS (unusual diarrhea, constipation, pain near the anus) TENDERNESS IN MOUTH AND THROAT WITH OR WITHOUT PRESENCE OF ULCERS (sore throat, sores in mouth, or a toothache) UNUSUAL RASH, SWELLING OR PAIN  UNUSUAL VAGINAL DISCHARGE OR ITCHING   Items with * indicate a potential emergency and should be followed up as soon as possible or go to the Emergency Department if any problems should occur.  Please show the CHEMOTHERAPY ALERT CARD or IMMUNOTHERAPY ALERT CARD at check-in  to the Emergency Department and triage nurse.  Should you have questions after your visit or need to cancel or reschedule your appointment, please contact [facility]  [phone] and follow the prompts.  Office hours are 8:00 a.m. to 4:30 p.m. Monday - Friday. Please note that voicemails left after 4:00 p.m. may not be returned until the following business day.  We are closed weekends and major holidays. You have access to a nurse at all times for urgent questions. Please call the main number to the clinic [phone] and follow the prompts.  For any non-urgent questions, you may also contact your provider using MyChart. We now offer e-Visits for anyone 62 and older to request care online for non-urgent symptoms. For details visit [email or web address].   Also download the MyChart app! Go to the app store, search "MyChart", open the app, select [facility], and log in with your MyChart username and password.  Due to Covid, a mask is required upon entering the hospital/clinic. If you do not have a mask, one will be given to you upon arrival. For doctor visits, patients may have 1 support person aged 70 or older with them. For treatment visits, patients cannot have anyone with them due to current Covid guidelines and our immunocompromised population.   Durvalumab injection What is this medication? DURVALUMAB (dur VAL ue mab) is a monoclonal antibody. It is used to treat lung cancer. This medicine may be used for other purposes; ask your health care provider or pharmacist if you have questions. COMMON BRAND NAME(S): IMFINZI What should I tell my care team before I take  this medication? They need to know if you have any of these conditions: autoimmune diseases like Crohn's disease, ulcerative colitis, or lupus have had or planning to have an allogeneic stem cell transplant (uses someone else's stem cells) history of organ transplant history of radiation to the  chest nervous system problems like myasthenia gravis or Guillain-Barre syndrome an unusual or allergic reaction to durvalumab, other medicines, foods, dyes, or preservatives pregnant or trying to get pregnant breast-feeding How should I use this medication? This medicine is for infusion into a vein. It is given by a health care professional in a hospital or clinic setting. A special MedGuide will be given to you before each treatment. Be sure to read this information carefully each time. Talk to your pediatrician regarding the use of this medicine in children. Special care may be needed. Overdosage: If you think you have taken too much of this medicine contact a poison control center or emergency room at once. NOTE: This medicine is only for you. Do not share this medicine with others. What if I miss a dose? It is important not to miss your dose. Call your doctor or health care professional if you are unable to keep an appointment. What may interact with this medication? Interactions have not been studied. This list may not describe all possible interactions. Give your health care provider a list of all the medicines, herbs, non-prescription drugs, or dietary supplements you use. Also tell them if you smoke, drink alcohol, or use illegal drugs. Some items may interact with your medicine. What should I watch for while using this medication? This medication may make you feel generally unwell. Continue your course of treatment even though you feel ill unless your care team tells you to stop. You may need blood work done while you are taking this medication. Do not become pregnant while taking this medication or for 3 months after stopping it. Women should inform their care team if they wish to become pregnant or think they might be pregnant. There is a potential for serious side effects to an unborn child. Talk to your care team or pharmacist for more information. Do not breast-feed an infant while  taking this medication or for 3 months after stopping it. What side effects may I notice from receiving this medication? Side effects that you should report to your care team as soon as possible: Allergic reactions--skin rash, itching, hives, swelling of the face, lips, tongue, or throat Bloody or watery diarrhea Dizziness, loss of balance or coordination, confusion or trouble speaking Dry cough, shortness of breath or trouble breathing Flushing, mostly over the face, neck, and chest, during injection High blood sugar (hyperglycemia)--increased thirst or amount of urine, unusual weakness or fatigue, blurry vision High thyroid levels (hyperthyroidism)--fast or irregular heartbeat, weight loss, excessive sweating or sensitivity to heat, tremors or shaking, anxiety, nervousness, irregular menstrual cycle or spotting Infection--fever, chills, cough, or sore throat Liver injury--right upper belly pain, loss of appetite, nausea, light-colored stool, dark yellow or brown urine, yellowing skin or eyes, unusual weakness or fatigue Low adrenal gland function--nausea, vomiting, loss of appetite, unusual weakness or fatigue, dizziness, low blood pressure Low thyroid levels (hypothyroidism)--unusual weakness or fatigue, increased sensitivity to cold, constipation, hair loss, dry skin, weight gain, feelings of depression Pancreatitis--severe stomach pain that spreads to your back or gets worse after eating or when touched, fever, nausea, vomiting Rash, fever, and swollen lymph nodes Redness, blistering, peeling or loosening of the skin, including inside the mouth Wheezing--trouble breathing with  loud or whistling sounds Side effects that usually do not require medical attention (report these to your care team if they continue or are bothersome): Fatigue Hair loss This list may not describe all possible side effects. Call your doctor for medical advice about side effects. You may report side effects to FDA at  1-800-FDA-1088. Where should I keep my medication? This medication is given in a hospital or clinic. It will not be stored at home. NOTE: This sheet is a summary. It may not cover all possible information. If you have questions about this medicine, talk to your doctor, pharmacist, or health care provider.  2022 Elsevier/Gold Standard (2021-04-07 00:00:00)

## 2021-06-19 ENCOUNTER — Inpatient Hospital Stay: Payer: BC Managed Care – PPO

## 2021-06-19 ENCOUNTER — Inpatient Hospital Stay (HOSPITAL_BASED_OUTPATIENT_CLINIC_OR_DEPARTMENT_OTHER): Payer: BC Managed Care – PPO | Admitting: Oncology

## 2021-06-19 ENCOUNTER — Encounter: Payer: Self-pay | Admitting: Oncology

## 2021-06-19 ENCOUNTER — Other Ambulatory Visit: Payer: Self-pay

## 2021-06-19 VITALS — BP 133/58 | HR 91 | Temp 97.0°F | Resp 19 | Wt 141.6 lb

## 2021-06-19 DIAGNOSIS — C3431 Malignant neoplasm of lower lobe, right bronchus or lung: Secondary | ICD-10-CM

## 2021-06-19 DIAGNOSIS — J329 Chronic sinusitis, unspecified: Secondary | ICD-10-CM

## 2021-06-19 DIAGNOSIS — Z5112 Encounter for antineoplastic immunotherapy: Secondary | ICD-10-CM

## 2021-06-19 LAB — COMPREHENSIVE METABOLIC PANEL
ALT: 16 U/L (ref 0–44)
AST: 25 U/L (ref 15–41)
Albumin: 3.9 g/dL (ref 3.5–5.0)
Alkaline Phosphatase: 64 U/L (ref 38–126)
Anion gap: 11 (ref 5–15)
BUN: 9 mg/dL (ref 8–23)
CO2: 25 mmol/L (ref 22–32)
Calcium: 9 mg/dL (ref 8.9–10.3)
Chloride: 99 mmol/L (ref 98–111)
Creatinine, Ser: 0.61 mg/dL (ref 0.44–1.00)
GFR, Estimated: >60 mL/min (ref >60)
Glucose, Bld: 128 mg/dL — ABNORMAL HIGH (ref 70–99)
Potassium: 4 mmol/L (ref 3.5–5.1)
Sodium: 135 mmol/L (ref 135–145)
Total Bilirubin: 0.3 mg/dL (ref 0.3–1.2)
Total Protein: 7.7 g/dL (ref 6.5–8.1)

## 2021-06-19 LAB — CBC WITH DIFFERENTIAL/PLATELET
Abs Immature Granulocytes: 0.03 10*3/uL (ref 0.00–0.07)
Basophils Absolute: 0.1 10*3/uL (ref 0.0–0.1)
Basophils Relative: 1 %
Eosinophils Absolute: 0.1 10*3/uL (ref 0.0–0.5)
Eosinophils Relative: 1 %
HCT: 35 % — ABNORMAL LOW (ref 36.0–46.0)
Hemoglobin: 11.5 g/dL — ABNORMAL LOW (ref 12.0–15.0)
Immature Granulocytes: 0 %
Lymphocytes Relative: 12 %
Lymphs Abs: 0.9 10*3/uL (ref 0.7–4.0)
MCH: 33 pg (ref 26.0–34.0)
MCHC: 32.9 g/dL (ref 30.0–36.0)
MCV: 100.6 fL — ABNORMAL HIGH (ref 80.0–100.0)
Monocytes Absolute: 0.6 10*3/uL (ref 0.1–1.0)
Monocytes Relative: 8 %
Neutro Abs: 5.7 10*3/uL (ref 1.7–7.7)
Neutrophils Relative %: 78 %
Platelets: 313 10*3/uL (ref 150–400)
RBC: 3.48 MIL/uL — ABNORMAL LOW (ref 3.87–5.11)
RDW: 15.1 % (ref 11.5–15.5)
WBC: 7.3 10*3/uL (ref 4.0–10.5)
nRBC: 0 % (ref 0.0–0.2)

## 2021-06-19 MED ORDER — SODIUM CHLORIDE 0.9% FLUSH
10.0000 mL | INTRAVENOUS | Status: DC | PRN
  Filled 2021-06-19: qty 10

## 2021-06-19 MED ORDER — SODIUM CHLORIDE 0.9 % IV SOLN
10.0000 mg/kg | Freq: Once | INTRAVENOUS | Status: AC
  Administered 2021-06-19: 620 mg via INTRAVENOUS
  Filled 2021-06-19: qty 10

## 2021-06-19 MED ORDER — SODIUM CHLORIDE 0.9 % IV SOLN
Freq: Once | INTRAVENOUS | Status: AC
  Filled 2021-06-19: qty 250

## 2021-06-19 MED ORDER — HEPARIN SOD (PORK) LOCK FLUSH 100 UNIT/ML IV SOLN
500.0000 [IU] | Freq: Once | INTRAVENOUS | Status: AC | PRN
  Filled 2021-06-19: qty 5

## 2021-06-19 MED ORDER — HEPARIN SOD (PORK) LOCK FLUSH 100 UNIT/ML IV SOLN
INTRAVENOUS | Status: AC
  Administered 2021-06-19: 500 [IU]
  Filled 2021-06-19: qty 5

## 2021-06-19 NOTE — Patient Instructions (Signed)
[facility]  Discharge Instructions: Thank you for choosing [facility] to provide your oncology and hematology care.  If you have a lab appointment with the [facility], please go directly to the [facility] and check in at the registration area.  Wear comfortable clothing and clothing appropriate for easy access to any Portacath or PICC line.   We strive to give you quality time with your provider. You may need to reschedule your appointment if you arrive late (15 or more minutes).  Arriving late affects you and other patients whose appointments are after yours.  Also, if you miss three or more appointments without notifying the office, you may be dismissed from the clinic at the provider's discretion.      For prescription refill requests, have your pharmacy contact our office and allow 72 hours for refills to be completed.    Today you received the following chemotherapy and/or immunotherapy agents - durvalumab      To help prevent nausea and vomiting after your treatment, we encourage you to take your nausea medication as directed.  BELOW ARE SYMPTOMS THAT SHOULD BE REPORTED IMMEDIATELY: *FEVER GREATER THAN 100.4 F (38 C) OR HIGHER *CHILLS OR SWEATING *NAUSEA AND VOMITING THAT IS NOT CONTROLLED WITH YOUR NAUSEA MEDICATION *UNUSUAL SHORTNESS OF BREATH *UNUSUAL BRUISING OR BLEEDING *URINARY PROBLEMS (pain or burning when urinating, or frequent urination) *BOWEL PROBLEMS (unusual diarrhea, constipation, pain near the anus) TENDERNESS IN MOUTH AND THROAT WITH OR WITHOUT PRESENCE OF ULCERS (sore throat, sores in mouth, or a toothache) UNUSUAL RASH, SWELLING OR PAIN  UNUSUAL VAGINAL DISCHARGE OR ITCHING   Items with * indicate a potential emergency and should be followed up as soon as possible or go to the Emergency Department if any problems should occur.  Please show the CHEMOTHERAPY ALERT CARD or IMMUNOTHERAPY ALERT CARD at  check-in to the Emergency Department and triage nurse.  Should you have questions after your visit or need to cancel or reschedule your appointment, please contact [facility]  [phone] and follow the prompts.  Office hours are 8:00 a.m. to 4:30 p.m. Monday - Friday. Please note that voicemails left after 4:00 p.m. may not be returned until the following business day.  We are closed weekends and major holidays. You have access to a nurse at all times for urgent questions. Please call the main number to the clinic [phone] and follow the prompts.  For any non-urgent questions, you may also contact your provider using MyChart. We now offer e-Visits for anyone 71 and older to request care online for non-urgent symptoms. For details visit [email or web address].   Also download the MyChart app! Go to the app store, search "MyChart", open the app, select [facility], and log in with your MyChart username and password.  Due to Covid, a mask is required upon entering the hospital/clinic. If you do not have a mask, one will be given to you upon arrival. For doctor visits, patients may have 1 support person aged 32 or older with them. For treatment visits, patients cannot have anyone with them due to current Covid guidelines and our immunocompromised population.   Durvalumab injection What is this medication? DURVALUMAB (dur VAL ue mab) is a monoclonal antibody. It is used to treat lung cancer. This medicine may be used for other purposes; ask your health care provider or pharmacist if you have questions. COMMON BRAND NAME(S): IMFINZI What should I tell my care team before I  take this medication? They need to know if you have any of these conditions: autoimmune diseases like Crohn's disease, ulcerative colitis, or lupus have had or planning to have an allogeneic stem cell transplant (uses someone else's stem cells) history of organ transplant history of  radiation to the chest nervous system problems like myasthenia gravis or Guillain-Barre syndrome an unusual or allergic reaction to durvalumab, other medicines, foods, dyes, or preservatives pregnant or trying to get pregnant breast-feeding How should I use this medication? This medicine is for infusion into a vein. It is given by a health care professional in a hospital or clinic setting. A special MedGuide will be given to you before each treatment. Be sure to read this information carefully each time. Talk to your pediatrician regarding the use of this medicine in children. Special care may be needed. Overdosage: If you think you have taken too much of this medicine contact a poison control center or emergency room at once. NOTE: This medicine is only for you. Do not share this medicine with others. What if I miss a dose? It is important not to miss your dose. Call your doctor or health care professional if you are unable to keep an appointment. What may interact with this medication? Interactions have not been studied. This list may not describe all possible interactions. Give your health care provider a list of all the medicines, herbs, non-prescription drugs, or dietary supplements you use. Also tell them if you smoke, drink alcohol, or use illegal drugs. Some items may interact with your medicine. What should I watch for while using this medication? This medication may make you feel generally unwell. Continue your course of treatment even though you feel ill unless your care team tells you to stop. You may need blood work done while you are taking this medication. Do not become pregnant while taking this medication or for 3 months after stopping it. Women should inform their care team if they wish to become pregnant or think they might be pregnant. There is a potential for serious side effects to an unborn child. Talk to your care team or pharmacist for more information. Do not breast-feed  an infant while taking this medication or for 3 months after stopping it. What side effects may I notice from receiving this medication? Side effects that you should report to your care team as soon as possible: Allergic reactions--skin rash, itching, hives, swelling of the face, lips, tongue, or throat Bloody or watery diarrhea Dizziness, loss of balance or coordination, confusion or trouble speaking Dry cough, shortness of breath or trouble breathing Flushing, mostly over the face, neck, and chest, during injection High blood sugar (hyperglycemia)--increased thirst or amount of urine, unusual weakness or fatigue, blurry vision High thyroid levels (hyperthyroidism)--fast or irregular heartbeat, weight loss, excessive sweating or sensitivity to heat, tremors or shaking, anxiety, nervousness, irregular menstrual cycle or spotting Infection--fever, chills, cough, or sore throat Liver injury--right upper belly pain, loss of appetite, nausea, light-colored stool, dark yellow or brown urine, yellowing skin or eyes, unusual weakness or fatigue Low adrenal gland function--nausea, vomiting, loss of appetite, unusual weakness or fatigue, dizziness, low blood pressure Low thyroid levels (hypothyroidism)--unusual weakness or fatigue, increased sensitivity to cold, constipation, hair loss, dry skin, weight gain, feelings of depression Pancreatitis--severe stomach pain that spreads to your back or gets worse after eating or when touched, fever, nausea, vomiting Rash, fever, and swollen lymph nodes Redness, blistering, peeling or loosening of the skin, including inside the mouth Wheezing--trouble breathing  with loud or whistling sounds Side effects that usually do not require medical attention (report these to your care team if they continue or are bothersome): Fatigue Hair loss This list may not describe all possible side effects. Call your doctor for medical advice about side effects. You may report side  effects to FDA at 1-800-FDA-1088. Where should I keep my medication? This medication is given in a hospital or clinic. It will not be stored at home. NOTE: This sheet is a summary. It may not cover all possible information. If you have questions about this medicine, talk to your doctor, pharmacist, or health care provider.  2022 Elsevier/Gold Standard (2021-04-07 00:00:00)

## 2021-06-19 NOTE — Progress Notes (Signed)
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  She quit smoking about 26 years ago.  Other medical problems include hypertension and hyponatremia.She has been treated for iron of possible pneumonia for the last 1 year.  She has a history of intermittent asthma for which she uses Advair.  She was seen by Dr. And underwent CT chest which showed dense infiltrate/solid mass in the right lower lobe with contiguous airspace opacity in the right upper lobe.  Findings could be secondary to pneumonia but concerning for bronchogenic carcinoma.  Patient then had a PET CT scan which showed consolidation in the medial aspect of the right lower lobe measuring 3.4 x 4 cm with an SUV of 11.6.  Hypermetabolic masslike thickening in the right hilum measuring 2.2 cm with intense hypermetabolic activity.  Groundglass airspace consolidation in the posterior aspect of the right upper lobe with an SUV of 11.4.  The right upper lobe opacity was nonspecific and differentials include pulmonary infection versus lymphangitic spread of carcinoma.  CT chest showed showed masslike area of distortion involving right lower lobe middle lobe as well as right upper lobe.  Generalized right hilar masslike appearance concerning for nodal involvement.   Patient underwent bronchoscopy and biopsies.Right lower lobe was concerning for adenocarcinoma.  Lymph node station 11  R, 4R, 7 were negative for malignancy.  No malignant cells identified in the right upper lobe.  Case was discussed at tumor board and given the hypermetabolism in the right upper lobe as well as hilum involvement cannot be ruled out despite negative biopsies.  Patient completed concurrent chemoradiation with weekly CarboTaxol in July 2022.  Scan showed partial response.  Maintenance durvalumab started on 02/27/2021    Interval history-she is currently on antibiotics and finishing up her course of steroids which she was prescribed for recurrent sinusitis.  Overall reports feeling better.  She has chronic cough which has improved.  Denies any exertional shortness of breath.  ECOG PS- 1 Pain scale- 0   Review of systems- Review of Systems  Constitutional:  Positive for malaise/fatigue. Negative for chills, fever and weight loss.  HENT:  Positive for sinus pain. Negative for congestion, ear discharge and nosebleeds.   Eyes:  Negative for blurred vision.  Respiratory:  Negative for cough, hemoptysis, sputum production, shortness of breath and wheezing.   Cardiovascular:  Negative for chest pain, palpitations, orthopnea and claudication.  Gastrointestinal:  Negative for abdominal pain, blood in stool, constipation, diarrhea, heartburn, melena, nausea and vomiting.  Genitourinary:  Negative for dysuria, flank pain, frequency, hematuria and urgency.  Musculoskeletal:  Negative for back pain, joint pain and myalgias.  Skin:  Negative for rash.  Neurological:  Negative for dizziness, tingling, focal weakness, seizures, weakness and headaches.  Endo/Heme/Allergies:  Does not bruise/bleed easily.  Psychiatric/Behavioral:  Negative for depression and suicidal ideas. The patient does not have insomnia.      No Known Allergies   Past Medical History:  Diagnosis Date  Anxiety    Asthma    Cough    Dyspnea    with exertion   GERD (gastroesophageal reflux disease)    Hypertension      Past  Surgical History:  Procedure Laterality Date   BUNIONECTOMY Bilateral    COLONOSCOPY     PORTA CATH INSERTION N/A 12/22/2020   Procedure: PORTA CATH INSERTION;  Surgeon: Algernon Huxley, MD;  Location: [facility] [facility];  Service: Cardiovascular;  Laterality: N/A;   VIDEO BRONCHOSCOPY WITH ENDOBRONCHIAL NAVIGATION N/A 11/07/2020   Procedure: VIDEO BRONCHOSCOPY WITH ENDOBRONCHIAL NAVIGATION;  Surgeon: Ottie Glazier, MD;  Location: [facility];  Service: Thoracic;  Laterality: N/A;   VIDEO BRONCHOSCOPY WITH ENDOBRONCHIAL ULTRASOUND N/A 11/07/2020   Procedure: VIDEO BRONCHOSCOPY WITH ENDOBRONCHIAL ULTRASOUND;  Surgeon: Ottie Glazier, MD;  Location: [facility];  Service: Thoracic;  Laterality: N/A;    Social History   Socioeconomic History   Marital status: Married    Spouse name: Not on file   Number of children: Not on file   Years of education: Not on file   Highest education level: Not on file  Occupational History   Not on file  Tobacco Use   Smoking status: Former    Packs/day: 1.00    Years: 15.00    Pack years: 15.00    Types: Cigarettes    Quit date: 11/07/1994    Years since quitting: 26.6   Smokeless tobacco: Never  Vaping Use   Vaping Use: Never used  Substance and Sexual Activity   Alcohol use: Yes    Comment:  wine daily   Drug use: Never   Sexual activity: Not on file  Other Topics Concern   Not on file  Social History Narrative   Not on file   Social Determinants of Health   Financial Resource Strain: Not on file  Food Insecurity: Not on file  Transportation Needs: Not on file  Physical Activity: Not on file  Stress: Not on file  Social Connections: Not on file  Intimate Partner Violence: Not on file    History reviewed. No pertinent family history.   Current Outpatient Medications:    acetaminophen (TYLENOL) 500 MG tablet, Take 500 mg by mouth every 6 (six) hours as needed., Disp: , Rfl:    ADVAIR DISKUS 250-50 MCG/DOSE AEPB, Inhale 1 puff into the  lungs as needed., Disp: , Rfl:    ALPRAZolam (XANAX) 0.5 MG tablet, Take 0.5 mg by mouth at bedtime as needed for sleep., Disp: , Rfl:    amLODipine (NORVASC) 5 MG tablet, Take 5 mg by mouth at bedtime., Disp: , Rfl:    azelastine (ASTELIN) 0.1 % nasal spray, Place 2 sprays into both nostrils 2 (two) times daily. Use in each nostril as directed, Disp: 30 mL, Rfl: 2   Cholecalciferol 50 MCG (2000 UT) CAPS, Take 2,000 Units by mouth daily., Disp: , Rfl:    docusate sodium (COLACE) 100 MG capsule, Take 100 mg by mouth daily., Disp: , Rfl:    esomeprazole (NEXIUM) 20 MG capsule, Take 20 mg by mouth as needed., Disp: , Rfl:    folic acid (FOLVITE) 1 MG tablet, TAKE 1 TABLET BY MOUTH EVERY DAY, Disp: 90 tablet, Rfl: 3   gabapentin (NEURONTIN) 300 MG capsule, Take 1 capsule (300 mg total) by mouth 3 (three) times daily. Start with 1 capsule nightly for 2 days, then 2 capsules after 2 days -1 in am, 1 at night for 1 week, then 1 tablet three times  a day, Disp: 69 capsule, Rfl: 0   metoprolol succinate (TOPROL-XL) 50 MG 24 hr tablet, Take 50 mg by mouth every morning., Disp: , Rfl:    zolpidem (AMBIEN) 10 MG tablet, Take 10 mg by mouth at bedtime as needed for sleep., Disp: , Rfl:    fluticasone (FLONASE) 50 MCG/ACT nasal spray, Place 2 sprays into both nostrils daily as needed for rhinitis. (Patient not taking: Reported on 06/05/2021), Disp: , Rfl:    fluticasone (FLONASE) 50 MCG/ACT nasal spray, Place into the nose. (Patient not taking: Reported on 06/05/2021), Disp: , Rfl:  No current facility-administered medications for this visit.  Facility-Administered Medications Ordered in Other Visits:    durvalumab (IMFINZI) 620 mg in sodium chloride 0.9 % 100 mL chemo infusion, 10 mg/kg (Treatment Plan Recorded), Intravenous, Once, Sindy Guadeloupe, MD, Last Rate: 112 mL/hr at 06/19/21 1039, 620 mg at 06/19/21 1039   heparin lock flush 100 UNIT/ML injection, , , ,    heparin lock flush 100 UNIT/ML injection, , , ,     heparin lock flush 100 UNIT/ML injection, , , ,    heparin lock flush 100 UNIT/ML injection, , , ,    heparin lock flush 100 unit/mL, 500 Units, Intracatheter, Once PRN, Sindy Guadeloupe, MD   sodium chloride flush (NS) 0.9 % injection 10 mL, 10 mL, Intravenous, Once, Sindy Guadeloupe, MD   sodium chloride flush (NS) 0.9 % injection 10 mL, 10 mL, Intravenous, PRN, Sindy Guadeloupe, MD, 10 mL at 01/26/21 0807   sodium chloride flush (NS) 0.9 % injection 10 mL, 10 mL, Intracatheter, PRN, Sindy Guadeloupe, MD  Physical exam:  Vitals:   06/19/21 0925  BP: (!) 133/58  Pulse: 91  Resp: 19  Temp: (!) 97 F (36.1 C)  SpO2: 99%  Weight: 141 lb 9.6 oz (64.2 kg)   Physical Exam Cardiovascular:     Rate and Rhythm: Normal rate and regular rhythm.     Heart sounds: Normal heart sounds.  Pulmonary:     Effort: Pulmonary effort is normal.     Comments: Mild bilateral scattered rhonchi Abdominal:     General: Bowel sounds are normal.     Palpations: Abdomen is soft.  Skin:    General: Skin is warm and dry.  Neurological:     Mental Status: She is alert and oriented to person, place, and time.     CMP Latest Ref Rng & Units 06/19/2021  Glucose 70 - 99 mg/dL 128(H)  BUN 8 - 23 mg/dL 9  Creatinine 0.44 - 1.00 mg/dL 0.61  Sodium 135 - 145 mmol/L 135  Potassium 3.5 - 5.1 mmol/L 4.0  Chloride 98 - 111 mmol/L 99  CO2 22 - 32 mmol/L 25  Calcium 8.9 - 10.3 mg/dL 9.0  Total Protein 6.5 - 8.1 g/dL 7.7  Total Bilirubin 0.3 - 1.2 mg/dL 0.3  Alkaline Phos 38 - 126 U/L 64  AST 15 - 41 U/L 25  ALT 0 - 44 U/L 16   CBC Latest Ref Rng & Units 06/19/2021  WBC 4.0 - 10.5 K/uL 7.3  Hemoglobin 12.0 - 15.0 g/dL 11.5(L)  Hematocrit 36.0 - 46.0 % 35.0(L)  Platelets 150 - 400 K/uL 313     Assessment and plan- Patient is a 65 y.o. female with adenocarcinoma of the right lung clinical stage III acT3 N1 M0.  She is here for on treatment assessment prior to cycle 8 of maintenance durvalumab  Counts okay to  proceed with  cycle 8 of maintenance durvalumab today.  She will directly proceed for cycle 9 in 2 weeks and see covering NP in 4 weeks for cycle 10.  I will see her 6 weeks from now for cycle 11.I will plan to obtain a repeat CT chest abdomen and pelvis with contrast in early December 2022.July 2022 and will continue for 1 year if scans continue to show stable response.  I have asked the patient to minimize her intake of steroids while she is on durvalumab.  Given her symptoms of recurrent sinusitis I will also refer her to ENT at this time   Visit Diagnosis 1. Malignant neoplasm of lower lobe of right lung ([place])   2. Recurrent sinusitis   3. Encounter for antineoplastic immunotherapy      Dr. Randa Evens, MD, MPH [facility] [phone] 06/19/2021 10:51 AM

## 2021-07-03 ENCOUNTER — Inpatient Hospital Stay: Payer: BC Managed Care – PPO

## 2021-07-03 ENCOUNTER — Other Ambulatory Visit: Payer: Self-pay

## 2021-07-03 ENCOUNTER — Inpatient Hospital Stay: Payer: BC Managed Care – PPO | Attending: Oncology

## 2021-07-03 VITALS — BP 145/69 | HR 91 | Temp 98.1°F | Resp 18 | Wt 142.4 lb

## 2021-07-03 DIAGNOSIS — C3431 Malignant neoplasm of lower lobe, right bronchus or lung: Secondary | ICD-10-CM

## 2021-07-03 DIAGNOSIS — Z5112 Encounter for antineoplastic immunotherapy: Secondary | ICD-10-CM | POA: Diagnosis not present

## 2021-07-03 DIAGNOSIS — Z79899 Other long term (current) drug therapy: Secondary | ICD-10-CM | POA: Insufficient documentation

## 2021-07-03 LAB — CBC WITH DIFFERENTIAL/PLATELET
Abs Immature Granulocytes: 0.02 10*3/uL (ref 0.00–0.07)
Basophils Absolute: 0 10*3/uL (ref 0.0–0.1)
Basophils Relative: 0 %
Eosinophils Absolute: 0.1 10*3/uL (ref 0.0–0.5)
Eosinophils Relative: 1 %
HCT: 32.7 % — ABNORMAL LOW (ref 36.0–46.0)
Hemoglobin: 10.8 g/dL — ABNORMAL LOW (ref 12.0–15.0)
Immature Granulocytes: 0 %
Lymphocytes Relative: 20 %
Lymphs Abs: 1.2 10*3/uL (ref 0.7–4.0)
MCH: 33 pg (ref 26.0–34.0)
MCHC: 33 g/dL (ref 30.0–36.0)
MCV: 100 fL (ref 80.0–100.0)
Monocytes Absolute: 0.6 10*3/uL (ref 0.1–1.0)
Monocytes Relative: 10 %
Neutro Abs: 4.3 10*3/uL (ref 1.7–7.7)
Neutrophils Relative %: 69 %
Platelets: 288 10*3/uL (ref 150–400)
RBC: 3.27 MIL/uL — ABNORMAL LOW (ref 3.87–5.11)
RDW: 14.7 % (ref 11.5–15.5)
WBC: 6.3 10*3/uL (ref 4.0–10.5)
nRBC: 0 % (ref 0.0–0.2)

## 2021-07-03 LAB — COMPREHENSIVE METABOLIC PANEL
ALT: 13 U/L (ref 0–44)
AST: 20 U/L (ref 15–41)
Albumin: 3.8 g/dL (ref 3.5–5.0)
Alkaline Phosphatase: 67 U/L (ref 38–126)
Anion gap: 12 (ref 5–15)
BUN: 13 mg/dL (ref 8–23)
CO2: 24 mmol/L (ref 22–32)
Calcium: 9.2 mg/dL (ref 8.9–10.3)
Chloride: 97 mmol/L — ABNORMAL LOW (ref 98–111)
Creatinine, Ser: 0.61 mg/dL (ref 0.44–1.00)
GFR, Estimated: >60 mL/min (ref >60)
Glucose, Bld: 116 mg/dL — ABNORMAL HIGH (ref 70–99)
Potassium: 3.7 mmol/L (ref 3.5–5.1)
Sodium: 133 mmol/L — ABNORMAL LOW (ref 135–145)
Total Bilirubin: 0.4 mg/dL (ref 0.3–1.2)
Total Protein: 7.5 g/dL (ref 6.5–8.1)

## 2021-07-03 LAB — TSH: TSH: 1.088 u[IU]/mL (ref 0.350–4.500)

## 2021-07-03 MED ORDER — SODIUM CHLORIDE 0.9 % IV SOLN
Freq: Once | INTRAVENOUS | Status: AC
  Filled 2021-07-03: qty 250

## 2021-07-03 MED ORDER — HEPARIN SOD (PORK) LOCK FLUSH 100 UNIT/ML IV SOLN
INTRAVENOUS | Status: AC
  Filled 2021-07-03: qty 5

## 2021-07-03 MED ORDER — HEPARIN SOD (PORK) LOCK FLUSH 100 UNIT/ML IV SOLN
500.0000 [IU] | Freq: Once | INTRAVENOUS | Status: AC | PRN
  Administered 2021-07-03: 500 [IU]
  Filled 2021-07-03: qty 5

## 2021-07-03 MED ORDER — SODIUM CHLORIDE 0.9 % IV SOLN
10.0000 mg/kg | Freq: Once | INTRAVENOUS | Status: AC
  Administered 2021-07-03: 620 mg via INTRAVENOUS
  Filled 2021-07-03: qty 10

## 2021-07-03 NOTE — Patient Instructions (Signed)
[facility] AT [place]  Discharge Instructions: Thank you for choosing [facility] to provide your oncology and hematology care.  If you have a lab appointment with the [facility], please go directly to the [facility] and check in at the registration area.  Wear comfortable clothing and clothing appropriate for easy access to any Portacath or PICC line.   We strive to give you quality time with your provider. You may need to reschedule your appointment if you arrive late (15 or more minutes).  Arriving late affects you and other patients whose appointments are after yours.  Also, if you miss three or more appointments without notifying the office, you may be dismissed from the clinic at the provider's discretion.      For prescription refill requests, have your pharmacy contact our office and allow 72 hours for refills to be completed.    Today you received the following chemotherapy and/or immunotherapy agents Imfinzi       To help prevent nausea and vomiting after your treatment, we encourage you to take your nausea medication as directed.  BELOW ARE SYMPTOMS THAT SHOULD BE REPORTED IMMEDIATELY: *FEVER GREATER THAN 100.4 F (38 C) OR HIGHER *CHILLS OR SWEATING *NAUSEA AND VOMITING THAT IS NOT CONTROLLED WITH YOUR NAUSEA MEDICATION *UNUSUAL SHORTNESS OF BREATH *UNUSUAL BRUISING OR BLEEDING *URINARY PROBLEMS (pain or burning when urinating, or frequent urination) *BOWEL PROBLEMS (unusual diarrhea, constipation, pain near the anus) TENDERNESS IN MOUTH AND THROAT WITH OR WITHOUT PRESENCE OF ULCERS (sore throat, sores in mouth, or a toothache) UNUSUAL RASH, SWELLING OR PAIN  UNUSUAL VAGINAL DISCHARGE OR ITCHING   Items with * indicate a potential emergency and should be followed up as soon as possible or go to the Emergency Department if any problems should occur.  Please show the CHEMOTHERAPY ALERT CARD or IMMUNOTHERAPY ALERT CARD at check-in to  the Emergency Department and triage nurse.  Should you have questions after your visit or need to cancel or reschedule your appointment, please contact [facility] [place] [facility] [place]  [phone] and follow the prompts.  Office hours are 8:00 a.m. to 4:30 p.m. Monday - Friday. Please note that voicemails left after 4:00 p.m. may not be returned until the following business day.  We are closed weekends and major holidays. You have access to a nurse at all times for urgent questions. Please call the main number to the clinic [phone] and follow the prompts.  For any non-urgent questions, you may also contact your provider using MyChart. We now offer e-Visits for anyone 42 and older to request care online for non-urgent symptoms. For details visit [email or web address].   Also download the MyChart app! Go to the app store, search "MyChart", open the app, select [facility], and log in with your MyChart username and password.  Due to Covid, a mask is required upon entering the hospital/clinic. If you do not have a mask, one will be given to you upon arrival. For doctor visits, patients may have 1 support person aged 71 or older with them. For treatment visits, patients cannot have anyone with them due to current Covid guidelines and our immunocompromised population.

## 2021-07-03 NOTE — Progress Notes (Signed)
Nutrition Follow-up:  Patient with stage III lung cancer.  Patient has completed concurrent chemotherapy and radiation.  Patient on immunotherapy.   Met with patient during infusion.  Patient reports appetite continues to be good.  Drinking ensure shake each morning.  Eating well at lunch and dinner.    Denies any nutrition impact symptoms.     Medications: reviewed  Labs: reviewed  Anthropometrics:   Weight 142 lb 6 oz today  143 lb on 11/4 142 lb on 10/21 145 lb on 9/9 150 lb on 6/27 159 lb on 5/17   NUTRITION DIAGNOSIS: Inadequate oral intake improved    INTERVENTION:  Continue ensure plus daily Continue eating well balanced diet of lean protein and increased plant foods. Contact information provided and encouraged patient to call RD if needed    MONITORING, EVALUATION, GOAL: weight trends, intake   NEXT VISIT: as needed  Katherine Perry Katherine Perry, [place], [place] Registered Dietitian [phone] (mobile)

## 2021-07-10 ENCOUNTER — Ambulatory Visit
Admission: RE | Admit: 2021-07-10 | Discharge: 2021-07-10 | Disposition: A | Discharge status: Home / Self Care | Payer: BC Managed Care – PPO | Source: Ambulatory Visit | Attending: Oncology | Admitting: Oncology

## 2021-07-10 ENCOUNTER — Other Ambulatory Visit: Payer: Self-pay

## 2021-07-10 DIAGNOSIS — C3431 Malignant neoplasm of lower lobe, right bronchus or lung: Secondary | ICD-10-CM | POA: Diagnosis not present

## 2021-07-10 MED ORDER — IOHEXOL 300 MG/ML  SOLN
100.0000 mL | Freq: Once | INTRAMUSCULAR | Status: AC | PRN
  Administered 2021-07-10: 100 mL via INTRAVENOUS

## 2021-07-17 ENCOUNTER — Other Ambulatory Visit: Payer: BC Managed Care – PPO

## 2021-07-17 ENCOUNTER — Encounter: Payer: Self-pay | Admitting: Oncology

## 2021-07-17 ENCOUNTER — Other Ambulatory Visit: Payer: Self-pay

## 2021-07-17 ENCOUNTER — Inpatient Hospital Stay: Payer: BC Managed Care – PPO

## 2021-07-17 ENCOUNTER — Inpatient Hospital Stay (HOSPITAL_BASED_OUTPATIENT_CLINIC_OR_DEPARTMENT_OTHER): Payer: BC Managed Care – PPO | Admitting: Oncology

## 2021-07-17 VITALS — BP 141/69 | HR 92 | Temp 98.6°F | Resp 16 | Wt 142.3 lb

## 2021-07-17 DIAGNOSIS — C3431 Malignant neoplasm of lower lobe, right bronchus or lung: Secondary | ICD-10-CM | POA: Diagnosis not present

## 2021-07-17 DIAGNOSIS — J329 Chronic sinusitis, unspecified: Secondary | ICD-10-CM

## 2021-07-17 DIAGNOSIS — Z5112 Encounter for antineoplastic immunotherapy: Secondary | ICD-10-CM | POA: Diagnosis not present

## 2021-07-17 LAB — CBC WITH DIFFERENTIAL/PLATELET
Abs Immature Granulocytes: 0.01 10*3/uL (ref 0.00–0.07)
Basophils Absolute: 0 10*3/uL (ref 0.0–0.1)
Basophils Relative: 0 %
Eosinophils Absolute: 0.1 10*3/uL (ref 0.0–0.5)
Eosinophils Relative: 2 %
HCT: 33.2 % — ABNORMAL LOW (ref 36.0–46.0)
Hemoglobin: 11.2 g/dL — ABNORMAL LOW (ref 12.0–15.0)
Immature Granulocytes: 0 %
Lymphocytes Relative: 18 %
Lymphs Abs: 0.9 10*3/uL (ref 0.7–4.0)
MCH: 33 pg (ref 26.0–34.0)
MCHC: 33.7 g/dL (ref 30.0–36.0)
MCV: 97.9 fL (ref 80.0–100.0)
Monocytes Absolute: 0.5 10*3/uL (ref 0.1–1.0)
Monocytes Relative: 10 %
Neutro Abs: 3.6 10*3/uL (ref 1.7–7.7)
Neutrophils Relative %: 70 %
Platelets: 297 10*3/uL (ref 150–400)
RBC: 3.39 MIL/uL — ABNORMAL LOW (ref 3.87–5.11)
RDW: 14.1 % (ref 11.5–15.5)
WBC: 5.1 10*3/uL (ref 4.0–10.5)
nRBC: 0 % (ref 0.0–0.2)

## 2021-07-17 LAB — COMPREHENSIVE METABOLIC PANEL
ALT: 14 U/L (ref 0–44)
AST: 23 U/L (ref 15–41)
Albumin: 3.8 g/dL (ref 3.5–5.0)
Alkaline Phosphatase: 74 U/L (ref 38–126)
Anion gap: 11 (ref 5–15)
BUN: 11 mg/dL (ref 8–23)
CO2: 24 mmol/L (ref 22–32)
Calcium: 9.4 mg/dL (ref 8.9–10.3)
Chloride: 98 mmol/L (ref 98–111)
Creatinine, Ser: 0.66 mg/dL (ref 0.44–1.00)
GFR, Estimated: >60 mL/min (ref >60)
Glucose, Bld: 151 mg/dL — ABNORMAL HIGH (ref 70–99)
Potassium: 3.5 mmol/L (ref 3.5–5.1)
Sodium: 133 mmol/L — ABNORMAL LOW (ref 135–145)
Total Bilirubin: 0.4 mg/dL (ref 0.3–1.2)
Total Protein: 7.7 g/dL (ref 6.5–8.1)

## 2021-07-17 MED ORDER — HEPARIN SOD (PORK) LOCK FLUSH 100 UNIT/ML IV SOLN
INTRAVENOUS | Status: AC
  Filled 2021-07-17: qty 5

## 2021-07-17 MED ORDER — HEPARIN SOD (PORK) LOCK FLUSH 100 UNIT/ML IV SOLN
500.0000 [IU] | Freq: Once | INTRAVENOUS | Status: AC | PRN
  Administered 2021-07-17: 500 [IU]
  Filled 2021-07-17: qty 5

## 2021-07-17 MED ORDER — SODIUM CHLORIDE 0.9 % IV SOLN
10.0000 mg/kg | Freq: Once | INTRAVENOUS | Status: AC
  Administered 2021-07-17: 620 mg via INTRAVENOUS
  Filled 2021-07-17: qty 2.4

## 2021-07-17 MED ORDER — SODIUM CHLORIDE 0.9 % IV SOLN
Freq: Once | INTRAVENOUS | Status: AC
  Filled 2021-07-17: qty 250

## 2021-07-17 NOTE — Progress Notes (Signed)
Patient here for treatment no concerns today.

## 2021-07-17 NOTE — Progress Notes (Signed)
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  She quit smoking about 26 years ago.  Other medical problems include hypertension and hyponatremia.She has been treated for iron of possible pneumonia for the last 1 year.  She has a history of intermittent asthma for which she uses Advair.  She was seen by Dr. And underwent CT chest which showed dense infiltrate/solid mass in the right lower lobe with contiguous airspace opacity in the right upper lobe.  Findings could be secondary to pneumonia but concerning for bronchogenic carcinoma.  Patient then had a PET CT scan which showed consolidation in the medial aspect of the right lower lobe measuring 3.4 x 4 cm with an SUV of 11.6.  Hypermetabolic masslike thickening in the right hilum measuring 2.2 cm with intense hypermetabolic activity.  Groundglass airspace consolidation in the posterior aspect of the right upper lobe with an SUV of 11.4.  The right upper lobe opacity was nonspecific and differentials include pulmonary infection versus lymphangitic spread of carcinoma.  CT chest showed showed masslike area of distortion involving right lower lobe middle lobe as well as right upper lobe.  Generalized right hilar masslike appearance concerning for nodal involvement.   Patient underwent bronchoscopy and biopsies.Right lower lobe was concerning for adenocarcinoma.  Lymph node station 11  R, 4R, 7 were negative for malignancy.  No malignant cells identified in the right upper lobe.  Case was discussed at tumor board and given the hypermetabolism in the right upper lobe as well as hilum involvement cannot be ruled out despite negative biopsies.  Patient completed concurrent chemoradiation with weekly CarboTaxol in July 2022.  Scan showed partial response.  Maintenance durvalumab started on 02/27/2021   Interval history-has stable but chronic cough and sinus congestion.  Has ENT appointment scheduled for next week.  She is nervous about her recent Ct scan results  Overall is tolerating durvalumab well.  ECOG PS- 1 Pain scale- 0   Review of systems- Review of Systems  Constitutional:  Positive for malaise/fatigue. Negative for chills, fever and weight loss.  HENT:  Positive for congestion. Negative for ear pain and tinnitus.   Eyes: Negative.  Negative for blurred vision and double vision.  Respiratory:  Positive for cough. Negative for sputum production and shortness of breath.   Cardiovascular: Negative.  Negative for chest pain, palpitations and leg swelling.  Gastrointestinal: Negative.  Negative for abdominal pain, constipation, diarrhea, nausea and vomiting.  Genitourinary:  Negative for dysuria, frequency and urgency.  Musculoskeletal:  Negative for back pain and falls.  Skin: Negative.  Negative for rash.  Neurological: Negative.  Negative for weakness and headaches.  Endo/Heme/Allergies: Negative.  Does not bruise/bleed easily.  Psychiatric/Behavioral: Negative.  Negative for depression. The patient is not nervous/anxious and does not have insomnia.      No Known Allergies   Past Medical History:  Diagnosis Date   Anxiety    Asthma    Cough    Dyspnea  with exertion   GERD (gastroesophageal reflux disease)    Hypertension      Past Surgical History:  Procedure Laterality Date   BUNIONECTOMY Bilateral    COLONOSCOPY     PORTA CATH INSERTION N/A  12/22/2020   Procedure: PORTA CATH INSERTION;  Surgeon: Algernon Huxley, MD;  Location: [place] [facility] [facility];  Service: Cardiovascular;  Laterality: N/A;   VIDEO BRONCHOSCOPY WITH ENDOBRONCHIAL NAVIGATION N/A 11/07/2020   Procedure: VIDEO BRONCHOSCOPY WITH ENDOBRONCHIAL NAVIGATION;  Surgeon: Ottie Glazier, MD;  Location: [facility];  Service: Thoracic;  Laterality: N/A;   VIDEO BRONCHOSCOPY WITH ENDOBRONCHIAL ULTRASOUND N/A 11/07/2020   Procedure: VIDEO BRONCHOSCOPY WITH ENDOBRONCHIAL ULTRASOUND;  Surgeon: Ottie Glazier, MD;  Location: [facility];  Service: Thoracic;  Laterality: N/A;    Social History   Socioeconomic History   Marital status: Married    Spouse name: Not on file   Number of children: Not on file   Years of education: Not on file   Highest education level: Not on file  Occupational History   Not on file  Tobacco Use   Smoking status: Former    Packs/day: 1.00    Years: 15.00    Pack years: 15.00    Types: Cigarettes    Quit date: 11/07/1994    Years since quitting: 26.7   Smokeless tobacco: Never  Vaping Use   Vaping Use: Never used  Substance and Sexual Activity   Alcohol use: Yes    Comment:  wine daily   Drug use: Never   Sexual activity: Not on file  Other Topics Concern   Not on file  Social History Narrative   Not on file   Social Determinants of Health   Financial Resource Strain: Not on file  Food Insecurity: Not on file  Transportation Needs: Not on file  Physical Activity: Not on file  Stress: Not on file  Social Connections: Not on file  Intimate Partner Violence: Not on file    History reviewed. No pertinent family history.   Current Outpatient Medications:    acetaminophen (TYLENOL) 500 MG tablet, Take 500 mg by mouth every 6 (six) hours as needed., Disp: , Rfl:    ADVAIR DISKUS 250-50 MCG/DOSE AEPB, Inhale 1 puff into the lungs as needed., Disp: , Rfl:    ALPRAZolam (XANAX) 0.5 MG tablet, Take 0.5 mg by mouth at bedtime as needed for sleep.,  Disp: , Rfl:    amLODipine (NORVASC) 5 MG tablet, Take 5 mg by mouth at bedtime., Disp: , Rfl:    azelastine (ASTELIN) 0.1 % nasal spray, Place 2 sprays into both nostrils 2 (two) times daily. Use in each nostril as directed, Disp: 30 mL, Rfl: 2   Cholecalciferol 50 MCG (2000 UT) CAPS, Take 2,000 Units by mouth daily., Disp: , Rfl:    docusate sodium (COLACE) 100 MG capsule, Take 100 mg by mouth daily., Disp: , Rfl:    esomeprazole (NEXIUM) 20 MG capsule, Take 20 mg by mouth as needed., Disp: , Rfl:    fluticasone (FLONASE) 50 MCG/ACT nasal spray, Place 2 sprays into both nostrils daily as needed for rhinitis., Disp: , Rfl:    fluticasone (FLONASE) 50 MCG/ACT nasal spray, Place into the nose., Disp: , Rfl:    folic acid (FOLVITE) 1 MG tablet, TAKE 1 TABLET BY MOUTH EVERY DAY, Disp: 90 tablet, Rfl: 3   gabapentin (NEURONTIN) 300 MG capsule, Take 1 capsule (300 mg total) by mouth 3 (three) times daily. Start with 1 capsule nightly  for 2 days, then 2 capsules after 2 days -1 in am, 1 at night for 1 week, then 1 tablet three times a day, Disp: 69 capsule, Rfl: 0   metoprolol succinate (TOPROL-XL) 50 MG 24 hr tablet, Take 50 mg by mouth every morning., Disp: , Rfl:    zolpidem (AMBIEN) 10 MG tablet, Take 10 mg by mouth at bedtime as needed for sleep., Disp: , Rfl:  No current facility-administered medications for this visit.  Facility-Administered Medications Ordered in Other Visits:    heparin lock flush 100 UNIT/ML injection, , , ,    heparin lock flush 100 UNIT/ML injection, , , ,    heparin lock flush 100 UNIT/ML injection, , , ,    heparin lock flush 100 UNIT/ML injection, , , ,    sodium chloride flush (NS) 0.9 % injection 10 mL, 10 mL, Intravenous, Once, Sindy Guadeloupe, MD   sodium chloride flush (NS) 0.9 % injection 10 mL, 10 mL, Intravenous, PRN, Sindy Guadeloupe, MD, 10 mL at 01/26/21 0807  Physical exam:  Vitals:   07/17/21 1309  BP: (!) 141/69  Pulse: 92  Resp: 16  Temp: 98.6 F (37  C)  TempSrc: Tympanic  Weight: 142 lb 4.8 oz (64.5 kg)   Physical Exam Constitutional:      Appearance: Normal appearance.  HENT:     Head: Normocephalic and atraumatic.  Eyes:     Pupils: Pupils are equal, round, and reactive to light.  Cardiovascular:     Rate and Rhythm: Normal rate and regular rhythm.     Heart sounds: Normal heart sounds. No murmur heard. Pulmonary:     Effort: Pulmonary effort is normal.     Breath sounds: Normal breath sounds. No wheezing.  Abdominal:     General: Bowel sounds are normal. There is no distension.     Palpations: Abdomen is soft.     Tenderness: There is no abdominal tenderness.  Musculoskeletal:        General: Normal range of motion.     Cervical back: Normal range of motion.  Skin:    General: Skin is warm and dry.     Findings: No rash.  Neurological:     Mental Status: She is alert and oriented to person, place, and time.  Psychiatric:        Judgment: Judgment normal.     CMP Latest Ref Rng & Units 07/17/2021  Glucose 70 - 99 mg/dL 151(H)  BUN 8 - 23 mg/dL 11  Creatinine 0.44 - 1.00 mg/dL 0.66  Sodium 135 - 145 mmol/L 133(L)  Potassium 3.5 - 5.1 mmol/L 3.5  Chloride 98 - 111 mmol/L 98  CO2 22 - 32 mmol/L 24  Calcium 8.9 - 10.3 mg/dL 9.4  Total Protein 6.5 - 8.1 g/dL 7.7  Total Bilirubin 0.3 - 1.2 mg/dL 0.4  Alkaline Phos 38 - 126 U/L 74  AST 15 - 41 U/L 23  ALT 0 - 44 U/L 14   CBC Latest Ref Rng & Units 07/17/2021  WBC 4.0 - 10.5 K/uL 5.1  Hemoglobin 12.0 - 15.0 g/dL 11.2(L)  Hematocrit 36.0 - 46.0 % 33.2(L)  Platelets 150 - 400 K/uL 297     Assessment and plan- Patient is a 65 y.o. female with adenocarcinoma of the right lung clinical stage III acT3 N1 M0.  She is here for on treatment assessment prior to cycle 11 of maintenance durvalumab.   Counts are acceptable for cycle 11 of maintenance nivolumab.  CT chest abdomen and pelvis shows interval increase in consolidation fibrosis and adjacent groundglass air  space opacity in the perihilar right lung.  Cavitation of the superior segment right lower lobe is similar from previous.  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  Will defer to Dr. Janese Banks if PET scan is warranted versus short-term follow-up.  Patient and husband in agreement.  Proceed with maintenance durvalumab today.   Recurrent Sinusitis- She has appt with ENT next week.  Disposition- Durvalumab today.  Follow-up as scheduled in 2 weeks see Dr. Janese Banks.  I spent 25 minutes dedicated to the care of this patient (face-to-face and non-face-to-face) on the date of the encounter to include what is described in the assessment and plan.  Visit Diagnosis 1. Malignant neoplasm of lower lobe of right lung ([place])   2. Recurrent sinusitis    Faythe Casa, NP 07/19/2021 9:13 AM

## 2021-07-17 NOTE — Patient Instructions (Signed)
[facility] AT [place]  Discharge Instructions: Thank you for choosing [facility] to provide your oncology and hematology care.  If you have a lab appointment with the [facility], please go directly to the [facility] and check in at the registration area.  Wear comfortable clothing and clothing appropriate for easy access to any Portacath or PICC line.   We strive to give you quality time with your provider. You may need to reschedule your appointment if you arrive late (15 or more minutes).  Arriving late affects you and other patients whose appointments are after yours.  Also, if you miss three or more appointments without notifying the office, you may be dismissed from the clinic at the providers discretion.      For prescription refill requests, have your pharmacy contact our office and allow 72 hours for refills to be completed.    Today you received the following chemotherapy and/or immunotherapy agents Imfiniz   To help prevent nausea and vomiting after your treatment, we encourage you to take your nausea medication as directed.  BELOW ARE SYMPTOMS THAT SHOULD BE REPORTED IMMEDIATELY: *FEVER GREATER THAN 100.4 F (38 C) OR HIGHER *CHILLS OR SWEATING *NAUSEA AND VOMITING THAT IS NOT CONTROLLED WITH YOUR NAUSEA MEDICATION *UNUSUAL SHORTNESS OF BREATH *UNUSUAL BRUISING OR BLEEDING *URINARY PROBLEMS (pain or burning when urinating, or frequent urination) *BOWEL PROBLEMS (unusual diarrhea, constipation, pain near the anus) TENDERNESS IN MOUTH AND THROAT WITH OR WITHOUT PRESENCE OF ULCERS (sore throat, sores in mouth, or a toothache) UNUSUAL RASH, SWELLING OR PAIN  UNUSUAL VAGINAL DISCHARGE OR ITCHING   Items with * indicate a potential emergency and should be followed up as soon as possible or go to the Emergency Department if any problems should occur.  Please show the CHEMOTHERAPY ALERT CARD or IMMUNOTHERAPY ALERT CARD at check-in to the  Emergency Department and triage nurse.  Should you have questions after your visit or need to cancel or reschedule your appointment, please contact [facility] CANCER [place] AT [place]  [phone] and follow the prompts.  Office hours are 8:00 a.m. to 4:30 p.m. Monday - Friday. Please note that voicemails left after 4:00 p.m. may not be returned until the following business day.  We are closed weekends and major holidays. You have access to a nurse at all times for urgent questions. Please call the main number to the clinic [phone] and follow the prompts.  For any non-urgent questions, you may also contact your provider using MyChart. We now offer e-Visits for anyone 64 and older to request care online for non-urgent symptoms. For details visit [email or web address].   Also download the MyChart app! Go to the app store, search "MyChart", open the app, select [facility], and log in with your MyChart username and password.  Due to Covid, a mask is required upon entering the hospital/clinic. If you do not have a mask, one will be given to you upon arrival. For doctor visits, patients may have 1 support person aged 64 or older with them. For treatment visits, patients cannot have anyone with them due to current Covid guidelines and our immunocompromised population.

## 2021-07-19 ENCOUNTER — Encounter: Payer: Self-pay | Admitting: Oncology

## 2021-07-28 ENCOUNTER — Other Ambulatory Visit: Payer: Self-pay | Admitting: Oncology

## 2021-07-28 NOTE — Progress Notes (Signed)
mo

## 2021-07-29 ENCOUNTER — Encounter: Payer: Self-pay | Admitting: Gastroenterology

## 2021-07-29 NOTE — H&P (Signed)
Pre-Procedure H&P   Patient ID: Katherine Perry is a 65 y.o. female.  Gastroenterology Provider: Annamaria Helling, DO  Referring Provider: Octavia Bruckner, PA PCP: Rusty Aus, MD  Date: 07/30/2021  HPI Ms. Katherine Perry is a 65 y.o. female who presents today for Colonoscopy for personal h/o colon polyps and fhx of colon cancer and colon polyps.  H/o Stage 3 lung CA s/p systemic chemotherapy and radiation (finished 01/2021)- now on Durvalumab followed by oncology.  Bowels moving regularly. No blood/melena/diarrhea/constipation  Hgb 11.9, Cr 0.6, WBC 5.1 as of 07/17/21  Colonoscopy January 2020- 30mm SSP in asc colon with 3 year repeat recommended Mother and father with colon cancer; sister- colon polyps   Past Medical History:  Diagnosis Date   Anxiety    Asthma    Cancer ([place])    Cough    Dyspnea    with exertion   GERD (gastroesophageal reflux disease)    Hypertension    Lung cancer [facility])     Past Surgical History:  Procedure Laterality Date   BUNIONECTOMY Bilateral    COLONOSCOPY     ESOPHAGOGASTRODUODENOSCOPY  04/01/2006   PORTA CATH INSERTION N/A 12/22/2020   Procedure: PORTA CATH INSERTION;  Surgeon: Algernon Huxley, MD;  Location: [place] [facility];  Service: Cardiovascular;  Laterality: N/A;   VIDEO BRONCHOSCOPY WITH ENDOBRONCHIAL NAVIGATION N/A 11/07/2020   Procedure: VIDEO BRONCHOSCOPY WITH ENDOBRONCHIAL NAVIGATION;  Surgeon: Ottie Glazier, MD;  Location: [facility] ORS;  Service: Thoracic;  Laterality: N/A;   VIDEO BRONCHOSCOPY WITH ENDOBRONCHIAL ULTRASOUND N/A 11/07/2020   Procedure: VIDEO BRONCHOSCOPY WITH ENDOBRONCHIAL ULTRASOUND;  Surgeon: Ottie Glazier, MD;  Location: [facility] ORS;  Service: Thoracic;  Laterality: N/A;    Family History Mother and father with colon cancer; sister- colon polyps No other h/o GI disease or malignancy  Review of Systems  Constitutional:  Negative for activity change, appetite change, chills, diaphoresis, fatigue, fever  and unexpected weight change.  HENT:  Negative for trouble swallowing and voice change.   Respiratory:  Negative for shortness of breath and wheezing.   Cardiovascular:  Negative for chest pain, palpitations and leg swelling.  Gastrointestinal:  Negative for abdominal distention, abdominal pain, anal bleeding, blood in stool, constipation, diarrhea, nausea, rectal pain and vomiting.  Musculoskeletal:  Negative for arthralgias and myalgias.  Skin:  Negative for color change and pallor.  Neurological:  Negative for dizziness, syncope and weakness.  Psychiatric/Behavioral:  Negative for confusion.   All other systems reviewed and are negative.   Medications Current Facility-Administered Medications on File Prior to Encounter  Medication Dose Route Frequency Provider Last Rate Last Admin   heparin lock flush 100 UNIT/ML injection            heparin lock flush 100 UNIT/ML injection            heparin lock flush 100 UNIT/ML injection            heparin lock flush 100 UNIT/ML injection            sodium chloride flush (NS) 0.9 % injection 10 mL  10 mL Intravenous Once Sindy Guadeloupe, MD       sodium chloride flush (NS) 0.9 % injection 10 mL  10 mL Intravenous PRN Sindy Guadeloupe, MD   10 mL at 01/26/21 0807   Current Outpatient Medications on File Prior to Encounter  Medication Sig Dispense Refill   ADVAIR DISKUS 250-50 MCG/DOSE AEPB Inhale 1 puff into the lungs as  needed.     ALPRAZolam (XANAX) 0.5 MG tablet Take 0.5 mg by mouth at bedtime as needed for sleep.     amLODipine (NORVASC) 5 MG tablet Take 5 mg by mouth at bedtime.     DURVALUMAB IV Inject into the vein every 14 (fourteen) days. For lung cancer     esomeprazole (NEXIUM) 20 MG capsule Take 20 mg by mouth as needed.     metoprolol succinate (TOPROL-XL) 50 MG 24 hr tablet Take 50 mg by mouth every morning.     zolpidem (AMBIEN) 10 MG tablet Take 10 mg by mouth at bedtime as needed for sleep.     acetaminophen (TYLENOL) 500 MG tablet  Take 500 mg by mouth every 6 (six) hours as needed.     azelastine (ASTELIN) 0.1 % nasal spray Place 2 sprays into both nostrils 2 (two) times daily. Use in each nostril as directed 30 mL 2   Cholecalciferol 50 MCG (2000 UT) CAPS Take 2,000 Units by mouth daily.     docusate sodium (COLACE) 100 MG capsule Take 100 mg by mouth daily.     fluticasone (FLONASE) 50 MCG/ACT nasal spray Place 2 sprays into both nostrils daily as needed for rhinitis.     fluticasone (FLONASE) 50 MCG/ACT nasal spray Place into the nose.     folic acid (FOLVITE) 1 MG tablet TAKE 1 TABLET BY MOUTH EVERY DAY 90 tablet 3   gabapentin (NEURONTIN) 300 MG capsule Take 1 capsule (300 mg total) by mouth 3 (three) times daily. Start with 1 capsule nightly for 2 days, then 2 capsules after 2 days -1 in am, 1 at night for 1 week, then 1 tablet three times a day (Patient not taking: Reported on 07/30/2021) 69 capsule 0    Pertinent medications related to GI and procedure were reviewed by me with the patient prior to the procedure   Current Facility-Administered Medications:    0.9 %  sodium chloride infusion, , Intravenous, Continuous, Annamaria Helling, DO, Last Rate: 20 mL/hr at 07/30/21 1048, Continued from Pre-op at 07/30/21 1048  Facility-Administered Medications Ordered in Other Encounters:    heparin lock flush 100 UNIT/ML injection, , , ,    heparin lock flush 100 UNIT/ML injection, , , ,    heparin lock flush 100 UNIT/ML injection, , , ,    heparin lock flush 100 UNIT/ML injection, , , ,    sodium chloride flush (NS) 0.9 % injection 10 mL, 10 mL, Intravenous, Once, Sindy Guadeloupe, MD   sodium chloride flush (NS) 0.9 % injection 10 mL, 10 mL, Intravenous, PRN, Sindy Guadeloupe, MD, 10 mL at 01/26/21 0807      Allergies  Allergen Reactions   Ace Inhibitors Cough   Allergies were reviewed by me prior to the procedure  Objective    Vitals:   07/30/21 0930  BP: (!) 152/74  Pulse: (!) 108  Resp: 20  Temp: (!)  96.8 F (36 C)  TempSrc: Temporal  SpO2: 100%  Weight: 63.5 kg  Height: 5\' 5"  (1.651 m)     Physical Exam Vitals and nursing note reviewed.  Constitutional:      General: She is not in acute distress.    Appearance: Normal appearance. She is not ill-appearing, toxic-appearing or diaphoretic.  HENT:     Head: Normocephalic and atraumatic.     Nose: Nose normal.     Mouth/Throat:     Mouth: Mucous membranes are moist.     Pharynx:  Oropharynx is clear.  Eyes:     General: No scleral icterus.    Extraocular Movements: Extraocular movements intact.  Cardiovascular:     Rate and Rhythm: Regular rhythm. Tachycardia present.     Heart sounds: Normal heart sounds. No murmur heard.   No friction rub. No gallop.     Comments: R sided chest port Pulmonary:     Effort: Pulmonary effort is normal. No respiratory distress.     Breath sounds: Normal breath sounds. No wheezing, rhonchi or rales.  Abdominal:     General: Bowel sounds are normal. There is no distension.     Palpations: Abdomen is soft.     Tenderness: There is no abdominal tenderness. There is no guarding or rebound.  Musculoskeletal:     Cervical back: Neck supple.     Right lower leg: No edema.     Left lower leg: No edema.  Skin:    General: Skin is warm and dry.     Coloration: Skin is not jaundiced or pale.  Neurological:     General: No focal deficit present.     Mental Status: She is alert and oriented to person, place, and time. Mental status is at baseline.  Psychiatric:        Mood and Affect: Mood normal.        Behavior: Behavior normal.        Thought Content: Thought content normal.        Judgment: Judgment normal.     Assessment:  Ms. Katherine Perry is a 65 y.o. female  who presents today for Colonoscopy for personal h/o colon polyps and fhx colon polyps and colon cancer.  Plan:  Colonoscopy with possible intervention today  Colonoscopy with possible biopsy, control of bleeding, polypectomy, and  interventions as necessary has been discussed with the patient/patient representative. Informed consent was obtained from the patient/patient representative after explaining the indication, nature, and risks of the procedure including but not limited to death, bleeding, perforation, missed neoplasm/lesions, cardiorespiratory compromise, and reaction to medications. Opportunity for questions was given and appropriate answers were provided. Patient/patient representative has verbalized understanding is amenable to undergoing the procedure.   Annamaria Helling, DO  [facility]  Portions of the record may have been created with voice recognition software. Occasional wrong-word or 'sound-a-like' substitutions may have occurred due to the inherent limitations of voice recognition software.  Read the chart carefully and recognize, using context, where substitutions may have occurred.

## 2021-07-30 ENCOUNTER — Ambulatory Visit
Admission: RE | Admit: 2021-07-30 | Discharge: 2021-07-30 | Disposition: A | Discharge status: Home / Self Care | Payer: BC Managed Care – PPO | Source: Ambulatory Visit | Attending: Gastroenterology | Admitting: Gastroenterology

## 2021-07-30 ENCOUNTER — Ambulatory Visit: Payer: BC Managed Care – PPO | Admitting: Anesthesiology

## 2021-07-30 ENCOUNTER — Encounter
Admission: RE | Disposition: A | Discharge status: Home / Self Care | Payer: Self-pay | Source: Ambulatory Visit | Attending: Gastroenterology

## 2021-07-30 ENCOUNTER — Encounter: Payer: Self-pay | Admitting: Gastroenterology

## 2021-07-30 ENCOUNTER — Other Ambulatory Visit: Payer: BC Managed Care – PPO

## 2021-07-30 DIAGNOSIS — Z800 Family history of malignant neoplasm of digestive organs: Secondary | ICD-10-CM | POA: Insufficient documentation

## 2021-07-30 DIAGNOSIS — Z8371 Family history of colonic polyps: Secondary | ICD-10-CM | POA: Insufficient documentation

## 2021-07-30 DIAGNOSIS — Z923 Personal history of irradiation: Secondary | ICD-10-CM | POA: Insufficient documentation

## 2021-07-30 DIAGNOSIS — D122 Benign neoplasm of ascending colon: Secondary | ICD-10-CM | POA: Insufficient documentation

## 2021-07-30 DIAGNOSIS — Z1211 Encounter for screening for malignant neoplasm of colon: Secondary | ICD-10-CM | POA: Insufficient documentation

## 2021-07-30 DIAGNOSIS — Z9221 Personal history of antineoplastic chemotherapy: Secondary | ICD-10-CM | POA: Diagnosis not present

## 2021-07-30 DIAGNOSIS — Z85118 Personal history of other malignant neoplasm of bronchus and lung: Secondary | ICD-10-CM | POA: Diagnosis not present

## 2021-07-30 DIAGNOSIS — K640 First degree hemorrhoids: Secondary | ICD-10-CM | POA: Insufficient documentation

## 2021-07-30 DIAGNOSIS — D124 Benign neoplasm of descending colon: Secondary | ICD-10-CM | POA: Insufficient documentation

## 2021-07-30 HISTORY — DX: Malignant (primary) neoplasm, unspecified: C80.1

## 2021-07-30 HISTORY — PX: COLONOSCOPY WITH PROPOFOL: SHX5780

## 2021-07-30 HISTORY — DX: Malignant neoplasm of unspecified part of unspecified bronchus or lung: C34.90

## 2021-07-30 SURGERY — COLONOSCOPY WITH PROPOFOL
Anesthesia: General

## 2021-07-30 MED ORDER — PROPOFOL 10 MG/ML IV BOLUS
INTRAVENOUS | Status: DC | PRN
  Administered 2021-07-30: 60 mg via INTRAVENOUS
  Administered 2021-07-30 (×2): 20 mg via INTRAVENOUS

## 2021-07-30 MED ORDER — PROPOFOL 500 MG/50ML IV EMUL
INTRAVENOUS | Status: DC | PRN
  Administered 2021-07-30: 160 ug/kg/min via INTRAVENOUS

## 2021-07-30 MED ORDER — STERILE WATER FOR IRRIGATION IR SOLN
Status: DC | PRN
  Administered 2021-07-30: 60 mL

## 2021-07-30 MED ORDER — PROPOFOL 500 MG/50ML IV EMUL
INTRAVENOUS | Status: AC
  Filled 2021-07-30: qty 50

## 2021-07-30 MED ORDER — SODIUM CHLORIDE 0.9 % IV SOLN: INTRAVENOUS | Status: DC

## 2021-07-30 NOTE — Interval H&P Note (Signed)
History and Physical Interval Note: Preprocedure H&P from 07/30/21  was reviewed and there was no interval change after seeing and examining the patient.  Written consent was obtained from the patient after discussion of risks, benefits, and alternatives. Patient has consented to proceed with Colonoscopy with possible intervention   07/30/2021 11:00 AM  Katherine Perry  has presented today for surgery, with the diagnosis of HX ADEN POLYPS.  The various methods of treatment have been discussed with the patient and family. After consideration of risks, benefits and other options for treatment, the patient has consented to  Procedure(s): COLONOSCOPY WITH PROPOFOL (N/A) as a surgical intervention.  The patient's history has been reviewed, patient examined, no change in status, stable for surgery.  I have reviewed the patient's chart and labs.  Questions were answered to the patient's satisfaction.     Annamaria Helling

## 2021-07-30 NOTE — Anesthesia Postprocedure Evaluation (Signed)
Anesthesia Post Note  Patient: Katherine Perry  Procedure(s) Performed: COLONOSCOPY WITH PROPOFOL  Patient location during evaluation: PACU Anesthesia Type: General Level of consciousness: awake and alert, oriented and patient cooperative Pain management: pain level controlled Vital Signs Assessment: post-procedure vital signs reviewed and stable Respiratory status: spontaneous breathing, nonlabored ventilation and respiratory function stable Cardiovascular status: blood pressure returned to baseline and stable Postop Assessment: adequate PO intake Anesthetic complications: no   No notable events documented.   Last Vitals:  Vitals:   07/30/21 1144 07/30/21 1154  BP: 112/60 (!) 152/84  Pulse: (!) 109 100  Resp: (!) 21 20  Temp: (!) 36.1 C   SpO2: 95% 99%    Last Pain:  Vitals:   07/30/21 1154  TempSrc:   PainSc: 0-No pain                 Darrin Nipper

## 2021-07-30 NOTE — Anesthesia Preprocedure Evaluation (Addendum)
Anesthesia Evaluation  Patient identified by MRN, date of birth, ID band Patient awake    Reviewed: Allergy & Precautions, NPO status , Patient's Chart, lab work & pertinent test results  History of Anesthesia Complications Negative for: history of anesthetic complications  Airway Mallampati: IV   Neck ROM: Full    Dental no notable dental hx.    Pulmonary asthma , former smoker (quit 1996),  Lung CA, not a surgical candidate   Pulmonary exam normal breath sounds clear to auscultation       Cardiovascular hypertension, Normal cardiovascular exam Rhythm:Regular Rate:Normal  ECG 11/07/20:  Normal sinus rhythm Nonspecific ST abnormality   Neuro/Psych PSYCHIATRIC DISORDERS Anxiety    GI/Hepatic GERD  ,  Endo/Other  negative endocrine ROS  Renal/GU negative Renal ROS     Musculoskeletal   Abdominal   Peds  Hematology negative hematology ROS (+)   Anesthesia Other Findings   Reproductive/Obstetrics                            Anesthesia Physical Anesthesia Plan  ASA: 3  Anesthesia Plan: General   Post-op Pain Management:    Induction: Intravenous  PONV Risk Score and Plan: 3 and Propofol infusion, TIVA and Treatment may vary due to age or medical condition  Airway Management Planned: Natural Airway  Additional Equipment:   Intra-op Plan:   Post-operative Plan:   Informed Consent: I have reviewed the patients History and Physical, chart, labs and discussed the procedure including the risks, benefits and alternatives for the proposed anesthesia with the patient or authorized representative who has indicated his/her understanding and acceptance.       Plan Discussed with: CRNA  Anesthesia Plan Comments: (LMA/GETA backup discussed.  Patient consented for risks of anesthesia including but not limited to:  - adverse reactions to medications - damage to eyes, teeth, lips or other  oral mucosa - nerve damage due to positioning  - sore throat or hoarseness - damage to heart, brain, nerves, lungs, other parts of body or loss of life  Informed patient about role of CRNA in peri- and intra-operative care.  Patient voiced understanding.)       Anesthesia Quick Evaluation

## 2021-07-30 NOTE — Transfer of Care (Signed)
Immediate Anesthesia Transfer of Care Note  Patient: Katherine Perry  Procedure(s) Performed: COLONOSCOPY WITH PROPOFOL  Patient Location: PACU  Anesthesia Type:General  Level of Consciousness: awake and alert   Airway & Oxygen Therapy: Patient Spontanous Breathing  Post-op Assessment: Report given to RN and Post -op Vital signs reviewed and stable  Post vital signs: Reviewed and stable  Last Vitals:  Vitals Value Taken Time  BP 112/60 07/30/21 1144  Temp    Pulse    Resp 26 07/30/21 1144  SpO2    Vitals shown include unvalidated device data.  Last Pain:  Vitals:   07/30/21 0930  TempSrc: Temporal         Complications: No notable events documented.

## 2021-07-30 NOTE — Progress Notes (Signed)
Tumor Board Documentation  Katherine Perry was presented by Dr Janese Banks at our Tumor Board on 07/30/2021, which included representatives from medical oncology, radiation oncology, surgical, radiology, pathology, internal medicine, navigation, research, pulmonology.  Katherine Perry currently presents as a current patient, for [place] with history of the following treatments: neoadjuvant chemoradiation, immunotherapy.  Additionally, we reviewed previous medical and familial history, history of present illness, and recent lab results along with all available histopathologic and imaging studies. The tumor board considered available treatment options and made the following recommendations: Active surveillance Continue current treatment  The following procedures/referrals were also placed: No orders of the defined types were placed in this encounter.   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  Tumor board is a meeting of clinicians from various specialty areas who evaluate and discuss patients for whom a multidisciplinary approach is being considered. Final determinations in the plan of care are those of the provider(s). The responsibility for follow up of recommendations given during tumor board is that of the provider.   Todays extended care, comprehensive team conference, Katherine Perry was not present for the discussion and was not examined.   Multidisciplinary Tumor Board is a multidisciplinary case peer review process.  Decisions discussed in the Multidisciplinary Tumor Board reflect the opinions of the specialists present at the conference without having examined the patient.  Ultimately, treatment and diagnostic decisions rest with the primary provider(s) and the patient.

## 2021-07-30 NOTE — Op Note (Signed)
[facility] Patient Name: Katherine Perry Procedure Date: 07/30/2021 10:53 AM MRN: [id number] Account #: [id number] Date of Birth: 01-Jun-1956 Admit Type: Outpatient Age: 65 Room: [facility] ENDO ROOM 1 Gender: Female Note Status: Finalized Instrument Name: Colonscope [id number] Procedure:             Colonoscopy Indications:           High risk colon cancer surveillance: Personal history                         of colonic polyps Providers:             Rueben Bash, DO Referring MD:          Rusty Aus, MD (Referring MD) Medicines:             Monitored Anesthesia Care Complications:         No immediate complications. Estimated blood loss:                         Minimal. Procedure:             Pre-Anesthesia Assessment:                        - Prior to the procedure, a History and Physical was                         performed, and patient medications and allergies were                         reviewed. The patient is competent. The risks and                         benefits of the procedure and the sedation options and                         risks were discussed with the patient. All questions                         were answered and informed consent was obtained.                         Patient identification and proposed procedure were                         verified by the physician, the nurse, the anesthetist                         and the technician in the endoscopy suite. Mental                         Status Examination: alert and oriented. Airway                         Examination: normal oropharyngeal airway and neck                         mobility. Respiratory Examination: clear to  auscultation. CV Examination: RRR, no murmurs, no S3                         or S4. Prophylactic Antibiotics: The patient does not                         require prophylactic antibiotics. Prior                          Anticoagulants: The patient has taken no previous                         anticoagulant or antiplatelet agents. ASA Grade                         Assessment: III - A patient with severe systemic                         disease. After reviewing the risks and benefits, the                         patient was deemed in satisfactory condition to                         undergo the procedure. The anesthesia plan was to use                         monitored anesthesia care (MAC). Immediately prior to                         administration of medications, the patient was                         re-assessed for adequacy to receive sedatives. The                         heart rate, respiratory rate, oxygen saturations,                         blood pressure, adequacy of pulmonary ventilation, and                         response to care were monitored throughout the                         procedure. The physical status of the patient was                         re-assessed after the procedure.                        After obtaining informed consent, the colonoscope was                         passed under direct vision. Throughout the procedure,                         the patient's blood pressure, pulse, and oxygen  saturations were monitored continuously. The                         Colonoscope was introduced through the anus and                         advanced to the the terminal ileum, with                         identification of the appendiceal orifice and IC                         valve. The colonoscopy was performed without                         difficulty. The patient tolerated the procedure well.                         The quality of the bowel preparation was evaluated                         using the BBPS [facility] Bowel Preparation Scale) with                         scores of: Right Colon = 3, Transverse Colon = 3 and                         Left Colon = 3  (entire mucosa seen well with no                         residual staining, small fragments of stool or opaque                         liquid). The total BBPS score equals 9. The terminal                         ileum, ileocecal valve, appendiceal orifice, and                         rectum were photographed. Findings:      The perianal and digital rectal examinations were normal. Pertinent       negatives include normal sphincter tone.      Non-bleeding internal hemorrhoids were found during retroflexion. The       hemorrhoids were Grade I (internal hemorrhoids that do not prolapse).       Estimated blood loss: none.      The terminal ileum appeared normal. Estimated blood loss: none.      A 3 to 4 mm polyp was found in the ascending colon. The polyp was       sessile. The polyp was removed with a cold snare. Resection and       retrieval were complete. Estimated blood loss was minimal.      A 1 to 2 mm polyp was found in the descending colon. The polyp was       sessile. The polyp was removed with a cold biopsy forceps. Resection and       retrieval were complete. Estimated blood loss was minimal.  The exam was otherwise without abnormality on direct and retroflexion       views. Impression:            - Non-bleeding internal hemorrhoids.                        - The examined portion of the ileum was normal.                        - One 3 to 4 mm polyp in the ascending colon, removed                         with a cold snare. Resected and retrieved.                        - One 1 to 2 mm polyp in the descending colon, removed                         with a cold biopsy forceps. Resected and retrieved.                        - The examination was otherwise normal on direct and                         retroflexion views. Recommendation:        - Discharge patient to home.                        - Resume previous diet.                        - Continue present medications.                         - Await pathology results.                        - Repeat colonoscopy for surveillance based on                         pathology results.                        - Return to referring physician as previously                         scheduled. Procedure Code(s):     --- Professional ---                        [phone], Colonoscopy, flexible; with removal of                         tumor(s), polyp(s), or other lesion(s) by snare                         technique                        78242, 59, Colonoscopy, flexible; with biopsy, single  or multiple Diagnosis Code(s):     --- Professional ---                        Z86.010, Personal history of colonic polyps                        K64.0, First degree hemorrhoids                        K63.5, Polyp of colon CPT copyright 2019 American Medical Association. All rights reserved. The codes documented in this report are preliminary and upon coder review may  be revised to meet current compliance requirements. Attending Participation:      I personally performed the entire procedure. Volney American, DO Annamaria Helling DO, DO 07/30/2021 11:44:31 AM This report has been signed electronically. Number of Addenda: 0 Note Initiated On: 07/30/2021 10:53 AM Scope Withdrawal Time: 0 hours 18 minutes 1 second  Total Procedure Duration: 0 hours 30 minutes 19 seconds  Estimated Blood Loss:  Estimated blood loss was minimal.      [facility]

## 2021-07-31 ENCOUNTER — Other Ambulatory Visit: Payer: Self-pay

## 2021-07-31 ENCOUNTER — Inpatient Hospital Stay: Payer: BC Managed Care – PPO

## 2021-07-31 ENCOUNTER — Inpatient Hospital Stay (HOSPITAL_BASED_OUTPATIENT_CLINIC_OR_DEPARTMENT_OTHER): Payer: BC Managed Care – PPO | Admitting: Oncology

## 2021-07-31 ENCOUNTER — Encounter: Payer: Self-pay | Admitting: Oncology

## 2021-07-31 VITALS — HR 91

## 2021-07-31 VITALS — BP 144/69 | HR 111 | Temp 98.7°F | Resp 16 | Ht 65.0 in | Wt 142.0 lb

## 2021-07-31 DIAGNOSIS — C3431 Malignant neoplasm of lower lobe, right bronchus or lung: Secondary | ICD-10-CM

## 2021-07-31 DIAGNOSIS — Z5112 Encounter for antineoplastic immunotherapy: Secondary | ICD-10-CM

## 2021-07-31 LAB — COMPREHENSIVE METABOLIC PANEL
ALT: 15 U/L (ref 0–44)
AST: 27 U/L (ref 15–41)
Albumin: 3.6 g/dL (ref 3.5–5.0)
Alkaline Phosphatase: 65 U/L (ref 38–126)
Anion gap: 10 (ref 5–15)
BUN: 6 mg/dL — ABNORMAL LOW (ref 8–23)
CO2: 25 mmol/L (ref 22–32)
Calcium: 9.1 mg/dL (ref 8.9–10.3)
Chloride: 101 mmol/L (ref 98–111)
Creatinine, Ser: 0.61 mg/dL (ref 0.44–1.00)
GFR, Estimated: >60 mL/min (ref >60)
Glucose, Bld: 142 mg/dL — ABNORMAL HIGH (ref 70–99)
Potassium: 3.7 mmol/L (ref 3.5–5.1)
Sodium: 136 mmol/L (ref 135–145)
Total Bilirubin: 0.3 mg/dL (ref 0.3–1.2)
Total Protein: 7 g/dL (ref 6.5–8.1)

## 2021-07-31 LAB — CBC WITH DIFFERENTIAL/PLATELET
Abs Immature Granulocytes: 0.02 10*3/uL (ref 0.00–0.07)
Basophils Absolute: 0 10*3/uL (ref 0.0–0.1)
Basophils Relative: 1 %
Eosinophils Absolute: 0.2 10*3/uL (ref 0.0–0.5)
Eosinophils Relative: 3 %
HCT: 35.2 % — ABNORMAL LOW (ref 36.0–46.0)
Hemoglobin: 11.5 g/dL — ABNORMAL LOW (ref 12.0–15.0)
Immature Granulocytes: 0 %
Lymphocytes Relative: 27 %
Lymphs Abs: 1.4 10*3/uL (ref 0.7–4.0)
MCH: 32.5 pg (ref 26.0–34.0)
MCHC: 32.7 g/dL (ref 30.0–36.0)
MCV: 99.4 fL (ref 80.0–100.0)
Monocytes Absolute: 0.5 10*3/uL (ref 0.1–1.0)
Monocytes Relative: 10 %
Neutro Abs: 3 10*3/uL (ref 1.7–7.7)
Neutrophils Relative %: 59 %
Platelets: 301 10*3/uL (ref 150–400)
RBC: 3.54 MIL/uL — ABNORMAL LOW (ref 3.87–5.11)
RDW: 13.7 % (ref 11.5–15.5)
WBC: 5.1 10*3/uL (ref 4.0–10.5)
nRBC: 0 % (ref 0.0–0.2)

## 2021-07-31 LAB — SURGICAL PATHOLOGY

## 2021-07-31 MED ORDER — SODIUM CHLORIDE 0.9% FLUSH
10.0000 mL | Freq: Once | INTRAVENOUS | Status: AC
  Administered 2021-07-31: 10:00:00 10 mL via INTRAVENOUS
  Filled 2021-07-31: qty 10

## 2021-07-31 MED ORDER — SODIUM CHLORIDE 0.9 % IV SOLN
10.0000 mg/kg | Freq: Once | INTRAVENOUS | Status: AC
  Administered 2021-07-31: 12:00:00 620 mg via INTRAVENOUS
  Filled 2021-07-31: qty 10

## 2021-07-31 MED ORDER — HEPARIN SOD (PORK) LOCK FLUSH 100 UNIT/ML IV SOLN
500.0000 [IU] | Freq: Once | INTRAVENOUS | Status: DC | PRN
  Filled 2021-07-31: qty 5

## 2021-07-31 MED ORDER — SODIUM CHLORIDE 0.9 % IV SOLN
Freq: Once | INTRAVENOUS | Status: AC
  Filled 2021-07-31: qty 250

## 2021-07-31 MED ORDER — HEPARIN SOD (PORK) LOCK FLUSH 100 UNIT/ML IV SOLN
INTRAVENOUS | Status: AC
  Administered 2021-07-31: 13:00:00 500 [IU] via INTRAVENOUS
  Filled 2021-07-31: qty 5

## 2021-07-31 MED ORDER — HEPARIN SOD (PORK) LOCK FLUSH 100 UNIT/ML IV SOLN
500.0000 [IU] | Freq: Once | INTRAVENOUS | Status: AC
  Filled 2021-07-31: qty 5

## 2021-07-31 NOTE — Patient Instructions (Signed)
[facility] AT [place]   Discharge Instructions: Thank you for choosing [facility] to provide your oncology and hematology care.  If you have a lab appointment with the [facility], please go directly to the [facility] and check in at the registration area.   Wear comfortable clothing and clothing appropriate for easy access to any Portacath or PICC line.   We strive to give you quality time with your provider. You may need to reschedule your appointment if you arrive late (15 or more minutes).  Arriving late affects you and other patients whose appointments are after yours.  Also, if you miss three or more appointments without notifying the office, you may be dismissed from the clinic at the providers discretion.      For prescription refill requests, have your pharmacy contact our office and allow 72 hours for refills to be completed.    Today you received the following chemotherapy and/or immunotherapy agents: Imfinzi.      To help prevent nausea and vomiting after your treatment, we encourage you to take your nausea medication as directed.  BELOW ARE SYMPTOMS THAT SHOULD BE REPORTED IMMEDIATELY: *FEVER GREATER THAN 100.4 F (38 C) OR HIGHER *CHILLS OR SWEATING *NAUSEA AND VOMITING THAT IS NOT CONTROLLED WITH YOUR NAUSEA MEDICATION *UNUSUAL SHORTNESS OF BREATH *UNUSUAL BRUISING OR BLEEDING *URINARY PROBLEMS (pain or burning when urinating, or frequent urination) *BOWEL PROBLEMS (unusual diarrhea, constipation, pain near the anus) TENDERNESS IN MOUTH AND THROAT WITH OR WITHOUT PRESENCE OF ULCERS (sore throat, sores in mouth, or a toothache) UNUSUAL RASH, SWELLING OR PAIN  UNUSUAL VAGINAL DISCHARGE OR ITCHING   Items with * indicate a potential emergency and should be followed up as soon as possible or go to the Emergency Department if any problems should occur.  Please show the CHEMOTHERAPY ALERT CARD or IMMUNOTHERAPY ALERT CARD at check-in  to the Emergency Department and triage nurse.  Should you have questions after your visit or need to cancel or reschedule your appointment, please contact [facility] AT [place]  Dept: [phone]  and follow the prompts.  Office hours are 8:00 a.m. to 4:30 p.m. Monday - Friday. Please note that voicemails left after 4:00 p.m. may not be returned until the following business day.  We are closed weekends and major holidays. You have access to a nurse at all times for urgent questions. Please call the main number to the clinic Dept: [phone] and follow the prompts.  For any non-urgent questions, you may also contact your provider using MyChart. We now offer e-Visits for anyone 24 and older to request care online for non-urgent symptoms. For details visit [email or web address].   Also download the MyChart app! Go to the app store, search "MyChart", open the app, select [facility], and log in with your MyChart username and password.  Due to Covid, a mask is required upon entering the hospital/clinic. If you do not have a mask, one will be given to you upon arrival. For doctor visits, patients may have 1 support person aged 10 or older with them. For treatment visits, patients cannot have anyone with them due to current Covid guidelines and our immunocompromised population.

## 2021-07-31 NOTE — Progress Notes (Signed)
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  She quit smoking about 26 years ago.  Other medical problems include hypertension and hyponatremia.She has been treated for iron of possible pneumonia for the last 1 year.  She has a history of intermittent asthma for which she uses Advair.  She was seen by Dr. And underwent CT chest which showed dense infiltrate/solid mass in the right lower lobe with contiguous airspace opacity in the right upper lobe.  Findings could be secondary to pneumonia but concerning for bronchogenic carcinoma.  Patient then had a PET CT scan which showed consolidation in the medial aspect of the right lower lobe measuring 3.4 x 4 cm with an SUV of 11.6.  Hypermetabolic masslike thickening in the right hilum measuring 2.2 cm with intense hypermetabolic activity.  Groundglass airspace consolidation in the posterior aspect of the right upper lobe with an SUV of 11.4.  The right upper lobe opacity was nonspecific and differentials include pulmonary infection versus lymphangitic spread of carcinoma.  CT chest showed showed masslike area of distortion involving right lower lobe middle lobe as well as right upper lobe.  Generalized right hilar masslike appearance concerning for nodal involvement.   Patient underwent bronchoscopy and biopsies.Right lower lobe was concerning for adenocarcinoma.  Lymph node station  11 R, 4R, 7 were negative for malignancy.  No malignant cells identified in the right upper lobe.  Case was discussed at tumor board and given the hypermetabolism in the right upper lobe as well as hilum involvement cannot be ruled out despite negative biopsies.  Patient completed concurrent chemoradiation with weekly CarboTaxol in July 2022.  Scan showed partial response.  Maintenance durvalumab started on 02/27/2021  Interval history-patient has relapsing and remitting chronic mildly productive cough.  She is taking over the counter cough supplements which has been helping her.  ECOG PS- 1 Pain scale- 0   Review of systems- Review of Systems  Constitutional:  Positive for malaise/fatigue. Negative for chills, fever and weight loss.  HENT:  Negative for congestion, ear discharge and nosebleeds.   Eyes:  Negative for blurred vision.  Respiratory:  Positive for cough. Negative for hemoptysis, sputum production, shortness of breath and wheezing.   Cardiovascular:  Negative for chest pain, palpitations, orthopnea and claudication.  Gastrointestinal:  Negative for abdominal pain, blood in stool, constipation, diarrhea, heartburn, melena, nausea and vomiting.  Genitourinary:  Negative for dysuria, flank pain, frequency, hematuria and urgency.  Musculoskeletal:  Negative for back pain, joint pain and myalgias.  Skin:  Negative for rash.  Neurological:  Negative for dizziness, tingling, focal weakness, seizures, weakness and headaches.  Endo/Heme/Allergies:  Does not bruise/bleed easily.  Psychiatric/Behavioral:  Negative for depression and suicidal ideas. The patient does not have insomnia.     Allergies  Allergen Reactions   Ace Inhibitors Cough     Past Medical History:  Diagnosis Date   Anxiety    Asthma    Cancer ([place])  Cough    Dyspnea    with exertion   GERD (gastroesophageal reflux disease)    Hypertension    Lung cancer [facility])      Past Surgical History:  Procedure  Laterality Date   BUNIONECTOMY Bilateral    COLONOSCOPY     COLONOSCOPY WITH PROPOFOL N/A 07/30/2021   Procedure: COLONOSCOPY WITH PROPOFOL;  Surgeon: Annamaria Helling, DO;  Location: [facility] ENDOSCOPY;  Service: Gastroenterology;  Laterality: N/A;   ESOPHAGOGASTRODUODENOSCOPY  04/01/2006   PORTA CATH INSERTION N/A 12/22/2020   Procedure: PORTA CATH INSERTION;  Surgeon: Algernon Huxley, MD;  Location: [facility];  Service: Cardiovascular;  Laterality: N/A;   VIDEO BRONCHOSCOPY WITH ENDOBRONCHIAL NAVIGATION N/A 11/07/2020   Procedure: VIDEO BRONCHOSCOPY WITH ENDOBRONCHIAL NAVIGATION;  Surgeon: Ottie Glazier, MD;  Location: [facility] ORS;  Service: Thoracic;  Laterality: N/A;   VIDEO BRONCHOSCOPY WITH ENDOBRONCHIAL ULTRASOUND N/A 11/07/2020   Procedure: VIDEO BRONCHOSCOPY WITH ENDOBRONCHIAL ULTRASOUND;  Surgeon: Ottie Glazier, MD;  Location: [facility] ORS;  Service: Thoracic;  Laterality: N/A;    Social History   Socioeconomic History   Marital status: Married    Spouse name: Not on file   Number of children: Not on file   Years of education: Not on file   Highest education level: Not on file  Occupational History   Not on file  Tobacco Use   Smoking status: Former    Packs/day: 1.50    Years: 26.80    Pack years: 40.20    Types: Cigarettes    Quit date: 11/07/1994    Years since quitting: 26.7   Smokeless tobacco: Never  Vaping Use   Vaping Use: Never used  Substance and Sexual Activity   Alcohol use: Not Currently    Comment:  wine daily   Drug use: Never   Sexual activity: Not on file  Other Topics Concern   Not on file  Social History Narrative   Not on file   Social Determinants of Health   Financial Resource Strain: Not on file  Food Insecurity: Not on file  Transportation Needs: Not on file  Physical Activity: Not on file  Stress: Not on file  Social Connections: Not on file  Intimate Partner Violence: Not on file    No family history on  file.   Current Outpatient Medications:    acetaminophen (TYLENOL) 500 MG tablet, Take 500 mg by mouth every 6 (six) hours as needed., Disp: , Rfl:    ADVAIR DISKUS 250-50 MCG/DOSE AEPB, Inhale 1 puff into the lungs as needed., Disp: , Rfl:    ALPRAZolam (XANAX) 0.5 MG tablet, Take 0.5 mg by mouth at bedtime as needed for sleep., Disp: , Rfl:    amLODipine (NORVASC) 5 MG tablet, Take 5 mg by mouth at bedtime., Disp: , Rfl:    azelastine (ASTELIN) 0.1 % nasal spray, Place 2 sprays into both nostrils 2 (two) times daily. Use in each nostril as directed, Disp: 30 mL, Rfl: 2   Cholecalciferol 50 MCG (2000 UT) CAPS, Take 2,000 Units by mouth daily., Disp: , Rfl:    docusate sodium (COLACE) 100 MG capsule, Take 100 mg by mouth daily., Disp: , Rfl:    DURVALUMAB IV, Inject into the vein every 14 (fourteen) days. For lung cancer, Disp: , Rfl:    esomeprazole (NEXIUM) 20 MG capsule, Take 20 mg by mouth as needed., Disp: , Rfl:    fluticasone (FLONASE) 50 MCG/ACT nasal spray, Place 2 sprays into both nostrils daily as  needed for rhinitis., Disp: , Rfl:    fluticasone (FLONASE) 50 MCG/ACT nasal spray, Place into the nose., Disp: , Rfl:    folic acid (FOLVITE) 1 MG tablet, TAKE 1 TABLET BY MOUTH EVERY DAY, Disp: 90 tablet, Rfl: 3   gabapentin (NEURONTIN) 300 MG capsule, Take 1 capsule (300 mg total) by mouth 3 (three) times daily. Start with 1 capsule nightly for 2 days, then 2 capsules after 2 days -1 in am, 1 at night for 1 week, then 1 tablet three times a day (Patient not taking: Reported on 07/30/2021), Disp: 69 capsule, Rfl: 0   metoprolol succinate (TOPROL-XL) 50 MG 24 hr tablet, Take 50 mg by mouth every morning., Disp: , Rfl:    zolpidem (AMBIEN) 10 MG tablet, Take 10 mg by mouth at bedtime as needed for sleep., Disp: , Rfl:  No current facility-administered medications for this visit.  Facility-Administered Medications Ordered in Other Visits:    heparin lock flush 100 UNIT/ML injection, , , ,     heparin lock flush 100 UNIT/ML injection, , , ,    heparin lock flush 100 UNIT/ML injection, , , ,    heparin lock flush 100 UNIT/ML injection, , , ,    heparin lock flush 100 unit/mL, 500 Units, Intravenous, Once, Sindy Guadeloupe, MD   sodium chloride flush (NS) 0.9 % injection 10 mL, 10 mL, Intravenous, Once, Sindy Guadeloupe, MD   sodium chloride flush (NS) 0.9 % injection 10 mL, 10 mL, Intravenous, PRN, Sindy Guadeloupe, MD, 10 mL at 01/26/21 0807   sodium chloride flush (NS) 0.9 % injection 10 mL, 10 mL, Intravenous, Once, Sindy Guadeloupe, MD  Physical exam:  Vitals:   07/31/21 1110  BP: (!) 144/69  Pulse: (!) 111  Resp: 16  Temp: 98.7 F (37.1 C)  TempSrc: Tympanic  Weight: 142 lb (64.4 kg)  Height: 5\' 5"  (1.651 m)   Physical Exam Constitutional:      General: She is not in acute distress. Cardiovascular:     Rate and Rhythm: Normal rate and regular rhythm.     Heart sounds: Normal heart sounds.  Pulmonary:     Effort: Pulmonary effort is normal.     Breath sounds: Normal breath sounds.  Abdominal:     General: Bowel sounds are normal.     Palpations: Abdomen is soft.  Skin:    General: Skin is warm and dry.  Neurological:     Mental Status: She is alert and oriented to person, place, and time.     CMP Latest Ref Rng & Units 07/17/2021  Glucose 70 - 99 mg/dL 151(H)  BUN 8 - 23 mg/dL 11  Creatinine 0.44 - 1.00 mg/dL 0.66  Sodium 135 - 145 mmol/L 133(L)  Potassium 3.5 - 5.1 mmol/L 3.5  Chloride 98 - 111 mmol/L 98  CO2 22 - 32 mmol/L 24  Calcium 8.9 - 10.3 mg/dL 9.4  Total Protein 6.5 - 8.1 g/dL 7.7  Total Bilirubin 0.3 - 1.2 mg/dL 0.4  Alkaline Phos 38 - 126 U/L 74  AST 15 - 41 U/L 23  ALT 0 - 44 U/L 14   CBC Latest Ref Rng & Units 07/17/2021  WBC 4.0 - 10.5 K/uL 5.1  Hemoglobin 12.0 - 15.0 g/dL 11.2(L)  Hematocrit 36.0 - 46.0 % 33.2(L)  Platelets 150 - 400 K/uL 297    No images are attached to the encounter.  CT CHEST ABDOMEN PELVIS W CONTRAST  Result  Date: 07/11/2021 CLINICAL  DATA:  Right lower lobe lung cancer restaging, status post chemotherapy and XRT EXAM: CT CHEST, ABDOMEN, AND PELVIS WITH CONTRAST TECHNIQUE: Multidetector CT imaging of the chest, abdomen and pelvis was performed following the standard protocol during bolus administration of intravenous contrast. CONTRAST:  143mL OMNIPAQUE IOHEXOL 300 MG/ML SOLN, additional oral enteric contrast COMPARISON:  CT chest, 03/31/2021, CT chest abdomen pelvis, 02/11/2021 FINDINGS: CT CHEST FINDINGS Cardiovascular: Right chest port catheter. Probable small focus of thrombus or fibrin sheath about port catheter tip in the superior vena cava (series 2, image 21). Aortic atherosclerosis. Normal heart size. Left and right coronary artery calcifications. No pericardial effusion. Mediastinum/Nodes: Unchanged enlarged pretracheal lymph nodes measuring up to 1.2 x 0.8 cm (series 2, image 19). Thyroid gland, trachea, and esophagus demonstrate no significant findings. Lungs/Pleura: Interval increase in consolidation, fibrosis, and adjacent ground-glass airspace opacity in the perihilar right lung, centered about the superior segment right lower lobe (series 3, image 77). Cavitation of the superior segment right lower lobe seen on prior examination is similar (series 3, image 86). Other areas of consolidation and ground-glass airspace opacity in the dependent right lung base seen on prior examination are improved or resolved (series 3, image 109). Musculoskeletal: No chest wall mass or suspicious bone lesions identified. CT ABDOMEN PELVIS FINDINGS Hepatobiliary: No solid liver abnormality is seen. No gallstones, gallbladder wall thickening, or biliary dilatation. Pancreas: Unremarkable. No pancreatic ductal dilatation or surrounding inflammatory changes. Spleen: Normal in size without significant abnormality. Adrenals/Urinary Tract: Adrenal glands are unremarkable. Kidneys are normal, without renal calculi, solid lesion, or  hydronephrosis. Bladder is unremarkable. Stomach/Bowel: Stomach is within normal limits. Appendix appears normal. No evidence of bowel wall thickening, distention, or inflammatory changes. Vascular/Lymphatic: Aortic atherosclerosis. No enlarged abdominal or pelvic lymph nodes. Reproductive: No mass or other abnormality. Other: No abdominal wall hernia or abnormality. No abdominopelvic ascites. Musculoskeletal: No acute or significant osseous findings. IMPRESSION: 1. Interval increase in consolidation, fibrosis, and adjacent ground-glass airspace opacity in the perihilar right lung, centered about the superior segment right lower lobe. Cavitation of the superior segment right lower lobe seen on prior examination is similar. These findings most likely reflect continued evolution of radiation fibrosis given time course, however residual or recurrent viable tumor within this region is difficult to exclude and could be further assessed for metabolic activity by PET-CT. 2. Other areas of consolidation and ground-glass airspace opacity in the dependent right lung base seen on prior examination are improved or resolved, consistent with resolution of nonspecific infection or inflammation. 3. Unchanged enlarged pretracheal lymph nodes, which are clearly enlarged compared to initial staging examinations but nonetheless nonspecific and potentially reactive given interval history of infection. As above, these could be further characterized for metabolic activity by PET-CT. 4. No evidence of metastatic disease in the abdomen or pelvis. 5. Coronary artery disease. Aortic Atherosclerosis (ICD10-I70.0). Electronically Signed   By: Delanna Ahmadi M.D.   On: 07/11/2021 11:37     Assessment and plan- Patient is a 65 y.o. female with adenocarcinoma of the right lung clinical stage III acT3 N1 M0.  She is here for on treatment assessment prior to cycle 12 of maintenance durvalumab and discuss CT scan results and further management  I  have reviewed CT chest abdomen and pelvis images independently.  We have also discussed her case at tumor board yesterday.  There continues to be evolving radiation changes in the perihilar region of the right lung.  Cavitation of the superior segment of the right lower lobe seen  where residual disease cannot be ruled out.  However patient has had these changes even prior to starting chemoradiation and these areas have demonstrated low levels of hypermetabolism on PET scan as well.  I therefore do not think a repeat PET scan at this time would add any further benefit.  There was no overt evidence of progression seen on scans and therefore my plan is to continue maintenance durvalumab at this time with repeat scans in 3 months time.  She will be completing 1 year of treatment in July 2023.  Counts otherwise okay to proceed with cycle 12 of maintenance durvalumab today and I will see her back in 2 weeks for cycle 13.  I will plan to switch her to monthly dosing of durvalumab starting cycle 13     Visit Diagnosis 1. Encounter for antineoplastic immunotherapy   2. Malignant neoplasm of lower lobe of right lung ([place])      Dr. Randa Evens, MD, MPH [facility] [phone] 07/31/2021 9:44 AM

## 2021-08-14 ENCOUNTER — Encounter: Payer: Self-pay | Admitting: Oncology

## 2021-08-14 ENCOUNTER — Inpatient Hospital Stay: Payer: BC Managed Care – PPO | Attending: Oncology

## 2021-08-14 ENCOUNTER — Inpatient Hospital Stay (HOSPITAL_BASED_OUTPATIENT_CLINIC_OR_DEPARTMENT_OTHER): Payer: BC Managed Care – PPO | Admitting: Oncology

## 2021-08-14 ENCOUNTER — Other Ambulatory Visit: Payer: Self-pay

## 2021-08-14 ENCOUNTER — Inpatient Hospital Stay: Payer: BC Managed Care – PPO

## 2021-08-14 VITALS — BP 134/61 | HR 95 | Temp 98.2°F | Resp 17 | Wt 142.0 lb

## 2021-08-14 DIAGNOSIS — C3431 Malignant neoplasm of lower lobe, right bronchus or lung: Secondary | ICD-10-CM

## 2021-08-14 DIAGNOSIS — Z5112 Encounter for antineoplastic immunotherapy: Secondary | ICD-10-CM | POA: Diagnosis present

## 2021-08-14 DIAGNOSIS — Z79899 Other long term (current) drug therapy: Secondary | ICD-10-CM | POA: Insufficient documentation

## 2021-08-14 DIAGNOSIS — Z87891 Personal history of nicotine dependence: Secondary | ICD-10-CM | POA: Insufficient documentation

## 2021-08-14 LAB — CBC WITH DIFFERENTIAL/PLATELET
Abs Immature Granulocytes: 0.01 10*3/uL (ref 0.00–0.07)
Basophils Absolute: 0 10*3/uL (ref 0.0–0.1)
Basophils Relative: 1 %
Eosinophils Absolute: 0.1 10*3/uL (ref 0.0–0.5)
Eosinophils Relative: 2 %
HCT: 33.1 % — ABNORMAL LOW (ref 36.0–46.0)
Hemoglobin: 11 g/dL — ABNORMAL LOW (ref 12.0–15.0)
Immature Granulocytes: 0 %
Lymphocytes Relative: 18 %
Lymphs Abs: 1 10*3/uL (ref 0.7–4.0)
MCH: 32.9 pg (ref 26.0–34.0)
MCHC: 33.2 g/dL (ref 30.0–36.0)
MCV: 99.1 fL (ref 80.0–100.0)
Monocytes Absolute: 0.7 10*3/uL (ref 0.1–1.0)
Monocytes Relative: 12 %
Neutro Abs: 3.7 10*3/uL (ref 1.7–7.7)
Neutrophils Relative %: 67 %
Platelets: 279 10*3/uL (ref 150–400)
RBC: 3.34 MIL/uL — ABNORMAL LOW (ref 3.87–5.11)
RDW: 13.3 % (ref 11.5–15.5)
WBC: 5.5 10*3/uL (ref 4.0–10.5)
nRBC: 0 % (ref 0.0–0.2)

## 2021-08-14 LAB — COMPREHENSIVE METABOLIC PANEL
ALT: 17 U/L (ref 0–44)
AST: 23 U/L (ref 15–41)
Albumin: 3.9 g/dL (ref 3.5–5.0)
Alkaline Phosphatase: 69 U/L (ref 38–126)
Anion gap: 7 (ref 5–15)
BUN: 14 mg/dL (ref 8–23)
CO2: 24 mmol/L (ref 22–32)
Calcium: 8.9 mg/dL (ref 8.9–10.3)
Chloride: 100 mmol/L (ref 98–111)
Creatinine, Ser: 0.86 mg/dL (ref 0.44–1.00)
GFR, Estimated: >60 mL/min (ref >60)
Glucose, Bld: 102 mg/dL — ABNORMAL HIGH (ref 70–99)
Potassium: 3.9 mmol/L (ref 3.5–5.1)
Sodium: 131 mmol/L — ABNORMAL LOW (ref 135–145)
Total Bilirubin: 0.5 mg/dL (ref 0.3–1.2)
Total Protein: 7.7 g/dL (ref 6.5–8.1)

## 2021-08-14 MED ORDER — SODIUM CHLORIDE 0.9 % IV SOLN
1500.0000 mg | Freq: Once | INTRAVENOUS | Status: AC
  Administered 2021-08-14: 1500 mg via INTRAVENOUS
  Filled 2021-08-14: qty 30

## 2021-08-14 MED ORDER — HEPARIN SOD (PORK) LOCK FLUSH 100 UNIT/ML IV SOLN
500.0000 [IU] | Freq: Once | INTRAVENOUS | Status: AC | PRN
  Administered 2021-08-14: 500 [IU]
  Filled 2021-08-14: qty 5

## 2021-08-14 MED ORDER — SODIUM CHLORIDE 0.9 % IV SOLN
Freq: Once | INTRAVENOUS | Status: AC
  Filled 2021-08-14: qty 250

## 2021-08-14 NOTE — Progress Notes (Signed)
Patient here for oncology follow-up appointment, expresses concerns of congestion

## 2021-08-14 NOTE — Progress Notes (Signed)
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  She quit smoking about 26 years ago.  Other medical problems include hypertension and hyponatremia.She has been treated for iron of possible pneumonia for the last 1 year.  She has a history of intermittent asthma for which she uses Advair.  She was seen by Dr. And underwent CT chest which showed dense infiltrate/solid mass in the right lower lobe with contiguous airspace opacity in the right upper lobe.  Findings could be secondary to pneumonia but concerning for bronchogenic carcinoma.  Patient then had a PET CT scan which showed consolidation in the medial aspect of the right lower lobe measuring 3.4 x 4 cm with an SUV of 11.6.  Hypermetabolic masslike thickening in the right hilum measuring 2.2 cm with intense hypermetabolic activity.  Groundglass airspace consolidation in the posterior aspect of the right upper lobe with an SUV of 11.4.  The right upper lobe opacity was nonspecific and differentials include pulmonary infection versus lymphangitic spread of carcinoma.  CT chest showed showed masslike area of distortion involving right lower lobe middle lobe as well as right upper lobe.  Generalized right hilar masslike appearance concerning for nodal involvement.   Patient underwent bronchoscopy and biopsies.Right lower lobe was concerning for adenocarcinoma.  Lymph node station  11 R, 4R, 7 were negative for malignancy.  No malignant cells identified in the right upper lobe.  Case was discussed at tumor board and given the hypermetabolism in the right upper lobe as well as hilum involvement cannot be ruled out despite negative biopsies.  Patient completed concurrent chemoradiation with weekly CarboTaxol in July 2022.  Scan showed partial response.  Maintenance durvalumab started on 02/27/2021    Interval history-continues to report some sinus congestion as well as intermittent wheezing and cough which has remained unchanged.  No new complaints today  ECOG PS- 1 Pain scale- 0  Review of systems- Review of Systems  Constitutional:  Negative for chills, fever, malaise/fatigue and weight loss.  HENT:  Negative for congestion, ear discharge and nosebleeds.   Eyes:  Negative for blurred vision.  Respiratory:  Positive for wheezing. Negative for cough, hemoptysis, sputum production and shortness of breath.   Cardiovascular:  Negative for chest pain, palpitations, orthopnea and claudication.  Gastrointestinal:  Negative for abdominal pain, blood in stool, constipation, diarrhea, heartburn, melena, nausea and vomiting.  Genitourinary:  Negative for dysuria, flank pain, frequency, hematuria and urgency.  Musculoskeletal:  Negative for back pain, joint pain and myalgias.  Skin:  Negative for rash.  Neurological:  Negative for dizziness, tingling, focal weakness, seizures, weakness and headaches.  Endo/Heme/Allergies:  Does not bruise/bleed easily.  Psychiatric/Behavioral:  Negative for depression and suicidal ideas. The patient does not have insomnia.       Allergies  Allergen Reactions   Ace Inhibitors Cough     Past Medical History:  Diagnosis Date   Anxiety    Asthma    Cancer ([place])  Cough    Dyspnea    with exertion   GERD (gastroesophageal reflux disease)    Hypertension    Lung cancer [facility])      Past Surgical History:  Procedure Laterality Date    BUNIONECTOMY Bilateral    COLONOSCOPY     COLONOSCOPY WITH PROPOFOL N/A 07/30/2021   Procedure: COLONOSCOPY WITH PROPOFOL;  Surgeon: Annamaria Helling, DO;  Location: [facility] ENDOSCOPY;  Service: Gastroenterology;  Laterality: N/A;   ESOPHAGOGASTRODUODENOSCOPY  04/01/2006   PORTA CATH INSERTION N/A 12/22/2020   Procedure: PORTA CATH INSERTION;  Surgeon: Algernon Huxley, MD;  Location: [place] CV LAB;  Service: Cardiovascular;  Laterality: N/A;   VIDEO BRONCHOSCOPY WITH ENDOBRONCHIAL NAVIGATION N/A 11/07/2020   Procedure: VIDEO BRONCHOSCOPY WITH ENDOBRONCHIAL NAVIGATION;  Surgeon: Ottie Glazier, MD;  Location: [facility] ORS;  Service: Thoracic;  Laterality: N/A;   VIDEO BRONCHOSCOPY WITH ENDOBRONCHIAL ULTRASOUND N/A 11/07/2020   Procedure: VIDEO BRONCHOSCOPY WITH ENDOBRONCHIAL ULTRASOUND;  Surgeon: Ottie Glazier, MD;  Location: [facility] ORS;  Service: Thoracic;  Laterality: N/A;    Social History   Socioeconomic History   Marital status: Married    Spouse name: Not on file   Number of children: Not on file   Years of education: Not on file   Highest education level: Not on file  Occupational History   Not on file  Tobacco Use   Smoking status: Former    Packs/day: 1.50    Years: 26.80    Pack years: 40.20    Types: Cigarettes    Quit date: 11/07/1994    Years since quitting: 26.7   Smokeless tobacco: Never  Vaping Use   Vaping Use: Never used  Substance and Sexual Activity   Alcohol use: Not Currently    Comment:  wine daily   Drug use: Never   Sexual activity: Not on file  Other Topics Concern   Not on file  Social History Narrative   Not on file   Social Determinants of Health   Financial Resource Strain: Not on file  Food Insecurity: Not on file  Transportation Needs: Not on file  Physical Activity: Not on file  Stress: Not on file  Social Connections: Not on file  Intimate Partner Violence: Not on file    History reviewed. No pertinent family  history.   Current Outpatient Medications:    acetaminophen (TYLENOL) 500 MG tablet, Take 500 mg by mouth every 6 (six) hours as needed., Disp: , Rfl:    ADVAIR DISKUS 250-50 MCG/DOSE AEPB, Inhale 1 puff into the lungs as needed., Disp: , Rfl:    ALPRAZolam (XANAX) 0.5 MG tablet, Take 0.5 mg by mouth at bedtime as needed for sleep., Disp: , Rfl:    amLODipine (NORVASC) 5 MG tablet, Take 5 mg by mouth at bedtime., Disp: , Rfl:    azelastine (ASTELIN) 0.1 % nasal spray, Place 2 sprays into both nostrils 2 (two) times daily. Use in each nostril as directed, Disp: 30 mL, Rfl: 2   Cholecalciferol 50 MCG (2000 UT) CAPS, Take 2,000 Units by mouth daily., Disp: , Rfl:    DURVALUMAB IV, Inject into the vein every 14 (fourteen) days. For lung cancer, Disp: , Rfl:    esomeprazole (NEXIUM) 20 MG capsule, Take 20 mg by mouth as needed., Disp: , Rfl:    fluticasone (FLONASE) 50 MCG/ACT nasal spray, Place 2 sprays into both nostrils daily as needed for rhinitis., Disp: , Rfl:    fluticasone (FLONASE) 50 MCG/ACT nasal spray, Place into  the nose., Disp: , Rfl:    folic acid (FOLVITE) 1 MG tablet, TAKE 1 TABLET BY MOUTH EVERY DAY, Disp: 90 tablet, Rfl: 3   metoprolol succinate (TOPROL-XL) 50 MG 24 hr tablet, Take 50 mg by mouth every morning., Disp: , Rfl:    zolpidem (AMBIEN) 10 MG tablet, Take 10 mg by mouth at bedtime as needed for sleep., Disp: , Rfl:    docusate sodium (COLACE) 100 MG capsule, Take 100 mg by mouth daily. (Patient not taking: Reported on 07/31/2021), Disp: , Rfl:  No current facility-administered medications for this visit.  Facility-Administered Medications Ordered in Other Visits:    heparin lock flush 100 UNIT/ML injection, , , ,    heparin lock flush 100 UNIT/ML injection, , , ,    heparin lock flush 100 UNIT/ML injection, , , ,    heparin lock flush 100 UNIT/ML injection, , , ,    sodium chloride flush (NS) 0.9 % injection 10 mL, 10 mL, Intravenous, Once, Sindy Guadeloupe, MD   sodium  chloride flush (NS) 0.9 % injection 10 mL, 10 mL, Intravenous, PRN, Sindy Guadeloupe, MD, 10 mL at 01/26/21 0807  Physical exam:  Vitals:   08/14/21 1317  BP: 134/61  Pulse: 95  Resp: 17  Temp: 98.2 F (36.8 C)  TempSrc: Tympanic  SpO2: 100%  Weight: 142 lb (64.4 kg)   Physical Exam Constitutional:      General: She is not in acute distress. Cardiovascular:     Rate and Rhythm: Normal rate and regular rhythm.     Heart sounds: Normal heart sounds.  Pulmonary:     Effort: Pulmonary effort is normal.     Comments: Scattered b/l wheezing Skin:    General: Skin is warm and dry.  Neurological:     Mental Status: She is alert and oriented to person, place, and time.     CMP Latest Ref Rng & Units 08/14/2021  Glucose 70 - 99 mg/dL 102(H)  BUN 8 - 23 mg/dL 14  Creatinine 0.44 - 1.00 mg/dL 0.86  Sodium 135 - 145 mmol/L 131(L)  Potassium 3.5 - 5.1 mmol/L 3.9  Chloride 98 - 111 mmol/L 100  CO2 22 - 32 mmol/L 24  Calcium 8.9 - 10.3 mg/dL 8.9  Total Protein 6.5 - 8.1 g/dL 7.7  Total Bilirubin 0.3 - 1.2 mg/dL 0.5  Alkaline Phos 38 - 126 U/L 69  AST 15 - 41 U/L 23  ALT 0 - 44 U/L 17   CBC Latest Ref Rng & Units 08/14/2021  WBC 4.0 - 10.5 K/uL 5.5  Hemoglobin 12.0 - 15.0 g/dL 11.0(L)  Hematocrit 36.0 - 46.0 % 33.1(L)  Platelets 150 - 400 K/uL 279   Assessment and plan- Patient is a 66 y.o. female with adenocarcinoma of the right lung clinical stage III acT3 N1 M0.she is here for on treatment assessment prior to cycle 13 of maintenance durvalumab  Counts ok to proceed with cycle 13 of maintenance durvalumab. I will see her back in 4 weeks for cycle 14.  We are switching to monthly dosing of durvalumab starting today.  She will receive durvalumab until July 2020.   Visit Diagnosis 1. Encounter for antineoplastic immunotherapy   2. Malignant neoplasm of lower lobe of right lung ([place])      Dr. Randa Evens, MD, MPH [facility] at [facility] [phone] 08/14/2021 1:35 PM

## 2021-08-14 NOTE — Patient Instructions (Signed)
[facility] CANCER CTR AT [place]  Discharge Instructions: Thank you for choosing [facility] to provide your oncology and hematology care.  If you have a lab appointment with the [facility], please go directly to the [place] and check in at the registration area.  Wear comfortable clothing and clothing appropriate for easy access to any Portacath or PICC line.   We strive to give you quality time with your provider. You may need to reschedule your appointment if you arrive late (15 or more minutes).  Arriving late affects you and other patients whose appointments are after yours.  Also, if you miss three or more appointments without notifying the office, you may be dismissed from the clinic at the providers discretion.      For prescription refill requests, have your pharmacy contact our office and allow 72 hours for refills to be completed.    Today you received the following chemotherapy and/or immunotherapy agents IMFINZI      To help prevent nausea and vomiting after your treatment, we encourage you to take your nausea medication as directed.  BELOW ARE SYMPTOMS THAT SHOULD BE REPORTED IMMEDIATELY: *FEVER GREATER THAN 100.4 F (38 C) OR HIGHER *CHILLS OR SWEATING *NAUSEA AND VOMITING THAT IS NOT CONTROLLED WITH YOUR NAUSEA MEDICATION *UNUSUAL SHORTNESS OF BREATH *UNUSUAL BRUISING OR BLEEDING *URINARY PROBLEMS (pain or burning when urinating, or frequent urination) *BOWEL PROBLEMS (unusual diarrhea, constipation, pain near the anus) TENDERNESS IN MOUTH AND THROAT WITH OR WITHOUT PRESENCE OF ULCERS (sore throat, sores in mouth, or a toothache) UNUSUAL RASH, SWELLING OR PAIN  UNUSUAL VAGINAL DISCHARGE OR ITCHING   Items with * indicate a potential emergency and should be followed up as soon as possible or go to the Emergency Department if any problems should occur.  Please show the CHEMOTHERAPY ALERT CARD or IMMUNOTHERAPY ALERT CARD at check-in to the  Emergency Department and triage nurse.  Should you have questions after your visit or need to cancel or reschedule your appointment, please contact [facility] CANCER [place] AT [place]  [phone] and follow the prompts.  Office hours are 8:00 a.m. to 4:30 p.m. Monday - Friday. Please note that voicemails left after 4:00 p.m. may not be returned until the following business day.  We are closed weekends and major holidays. You have access to a nurse at all times for urgent questions. Please call the main number to the clinic [phone] and follow the prompts.  For any non-urgent questions, you may also contact your provider using MyChart. We now offer e-Visits for anyone 12 and older to request care online for non-urgent symptoms. For details visit [email or web address].   Also download the MyChart app! Go to the app store, search "MyChart", open the app, select [place], and log in with your MyChart username and password.  Due to Covid, a mask is required upon entering the hospital/clinic. If you do not have a mask, one will be given to you upon arrival. For doctor visits, patients may have 1 support person aged 79 or older with them. For treatment visits, patients cannot have anyone with them due to current Covid guidelines and our immunocompromised population.   Durvalumab injection What is this medication? DURVALUMAB (dur VAL ue mab) is a monoclonal antibody. It is used to treat lung cancer. This medicine may be used for other purposes; ask your health care provider or pharmacist if you have questions. COMMON BRAND NAME(S): IMFINZI What should I tell my care team before I take  this medication? They need to know if you have any of these conditions: autoimmune diseases like Crohn's disease, ulcerative colitis, or lupus have had or planning to have an allogeneic stem cell transplant (uses someone else's stem cells) history of organ transplant history of radiation to the  chest nervous system problems like myasthenia gravis or Guillain-Barre syndrome an unusual or allergic reaction to durvalumab, other medicines, foods, dyes, or preservatives pregnant or trying to get pregnant breast-feeding How should I use this medication? This medicine is for infusion into a vein. It is given by a health care professional in a hospital or clinic setting. A special MedGuide will be given to you before each treatment. Be sure to read this information carefully each time. Talk to your pediatrician regarding the use of this medicine in children. Special care may be needed. Overdosage: If you think you have taken too much of this medicine contact a poison control center or emergency room at once. NOTE: This medicine is only for you. Do not share this medicine with others. What if I miss a dose? It is important not to miss your dose. Call your doctor or health care professional if you are unable to keep an appointment. What may interact with this medication? Interactions have not been studied. This list may not describe all possible interactions. Give your health care provider a list of all the medicines, herbs, non-prescription drugs, or dietary supplements you use. Also tell them if you smoke, drink alcohol, or use illegal drugs. Some items may interact with your medicine. What should I watch for while using this medication? This medication may make you feel generally unwell. Continue your course of treatment even though you feel ill unless your care team tells you to stop. You may need blood work done while you are taking this medication. Do not become pregnant while taking this medication or for 3 months after stopping it. Women should inform their care team if they wish to become pregnant or think they might be pregnant. There is a potential for serious side effects to an unborn child. Talk to your care team or pharmacist for more information. Do not breast-feed an infant while  taking this medication or for 3 months after stopping it. What side effects may I notice from receiving this medication? Side effects that you should report to your care team as soon as possible: Allergic reactions--skin rash, itching, hives, swelling of the face, lips, tongue, or throat Bloody or watery diarrhea Dizziness, loss of balance or coordination, confusion or trouble speaking Dry cough, shortness of breath or trouble breathing Flushing, mostly over the face, neck, and chest, during injection High blood sugar (hyperglycemia)--increased thirst or amount of urine, unusual weakness or fatigue, blurry vision High thyroid levels (hyperthyroidism)--fast or irregular heartbeat, weight loss, excessive sweating or sensitivity to heat, tremors or shaking, anxiety, nervousness, irregular menstrual cycle or spotting Infection--fever, chills, cough, or sore throat Liver injury--right upper belly pain, loss of appetite, nausea, light-colored stool, dark yellow or brown urine, yellowing skin or eyes, unusual weakness or fatigue Low adrenal gland function--nausea, vomiting, loss of appetite, unusual weakness or fatigue, dizziness, low blood pressure Low thyroid levels (hypothyroidism)--unusual weakness or fatigue, increased sensitivity to cold, constipation, hair loss, dry skin, weight gain, feelings of depression Pancreatitis--severe stomach pain that spreads to your back or gets worse after eating or when touched, fever, nausea, vomiting Rash, fever, and swollen lymph nodes Redness, blistering, peeling or loosening of the skin, including inside the mouth Wheezing--trouble breathing with  loud or whistling sounds Side effects that usually do not require medical attention (report these to your care team if they continue or are bothersome): Fatigue Hair loss This list may not describe all possible side effects. Call your doctor for medical advice about side effects. You may report side effects to FDA at  1-800-FDA-1088. Where should I keep my medication? This medication is given in a hospital or clinic. It will not be stored at home. NOTE: This sheet is a summary. It may not cover all possible information. If you have questions about this medicine, talk to your doctor, pharmacist, or health care provider.  2022 Elsevier/Gold Standard (2021-04-07 00:00:00)

## 2021-08-16 NOTE — Addendum Note (Signed)
Addended by: Luella Cook on: 08/16/2021 06:09 PM   Modules accepted: Orders

## 2021-09-11 ENCOUNTER — Inpatient Hospital Stay (HOSPITAL_BASED_OUTPATIENT_CLINIC_OR_DEPARTMENT_OTHER): Payer: BC Managed Care – PPO | Admitting: Oncology

## 2021-09-11 ENCOUNTER — Other Ambulatory Visit: Payer: Self-pay

## 2021-09-11 ENCOUNTER — Other Ambulatory Visit: Payer: Self-pay | Admitting: *Deleted

## 2021-09-11 ENCOUNTER — Inpatient Hospital Stay: Payer: BC Managed Care – PPO | Attending: Oncology

## 2021-09-11 ENCOUNTER — Inpatient Hospital Stay: Payer: BC Managed Care – PPO

## 2021-09-11 ENCOUNTER — Encounter: Payer: Self-pay | Admitting: Oncology

## 2021-09-11 VITALS — BP 128/75 | HR 92 | Temp 97.5°F | Resp 16 | Wt 143.6 lb

## 2021-09-11 DIAGNOSIS — C3431 Malignant neoplasm of lower lobe, right bronchus or lung: Secondary | ICD-10-CM

## 2021-09-11 DIAGNOSIS — Z87891 Personal history of nicotine dependence: Secondary | ICD-10-CM | POA: Insufficient documentation

## 2021-09-11 DIAGNOSIS — Z5112 Encounter for antineoplastic immunotherapy: Secondary | ICD-10-CM

## 2021-09-11 DIAGNOSIS — Z79899 Other long term (current) drug therapy: Secondary | ICD-10-CM | POA: Insufficient documentation

## 2021-09-11 LAB — COMPREHENSIVE METABOLIC PANEL
ALT: 14 U/L (ref 0–44)
AST: 23 U/L (ref 15–41)
Albumin: 4 g/dL (ref 3.5–5.0)
Alkaline Phosphatase: 62 U/L (ref 38–126)
Anion gap: 9 (ref 5–15)
BUN: 8 mg/dL (ref 8–23)
CO2: 25 mmol/L (ref 22–32)
Calcium: 9.2 mg/dL (ref 8.9–10.3)
Chloride: 99 mmol/L (ref 98–111)
Creatinine, Ser: 0.68 mg/dL (ref 0.44–1.00)
GFR, Estimated: >60 mL/min (ref >60)
Glucose, Bld: 119 mg/dL — ABNORMAL HIGH (ref 70–99)
Potassium: 3.8 mmol/L (ref 3.5–5.1)
Sodium: 133 mmol/L — ABNORMAL LOW (ref 135–145)
Total Bilirubin: 0.1 mg/dL — ABNORMAL LOW (ref 0.3–1.2)
Total Protein: 7.6 g/dL (ref 6.5–8.1)

## 2021-09-11 LAB — CBC WITH DIFFERENTIAL/PLATELET
Abs Immature Granulocytes: 0.01 10*3/uL (ref 0.00–0.07)
Basophils Absolute: 0 10*3/uL (ref 0.0–0.1)
Basophils Relative: 1 %
Eosinophils Absolute: 0.2 10*3/uL (ref 0.0–0.5)
Eosinophils Relative: 4 %
HCT: 35.6 % — ABNORMAL LOW (ref 36.0–46.0)
Hemoglobin: 12.1 g/dL (ref 12.0–15.0)
Immature Granulocytes: 0 %
Lymphocytes Relative: 27 %
Lymphs Abs: 1 10*3/uL (ref 0.7–4.0)
MCH: 33.5 pg (ref 26.0–34.0)
MCHC: 34 g/dL (ref 30.0–36.0)
MCV: 98.6 fL (ref 80.0–100.0)
Monocytes Absolute: 0.5 10*3/uL (ref 0.1–1.0)
Monocytes Relative: 12 %
Neutro Abs: 2.2 10*3/uL (ref 1.7–7.7)
Neutrophils Relative %: 56 %
Platelets: 270 10*3/uL (ref 150–400)
RBC: 3.61 MIL/uL — ABNORMAL LOW (ref 3.87–5.11)
RDW: 13.2 % (ref 11.5–15.5)
WBC: 3.9 10*3/uL — ABNORMAL LOW (ref 4.0–10.5)
nRBC: 0 % (ref 0.0–0.2)

## 2021-09-11 LAB — TSH: TSH: 1.335 u[IU]/mL (ref 0.350–4.500)

## 2021-09-11 MED ORDER — HEPARIN SOD (PORK) LOCK FLUSH 100 UNIT/ML IV SOLN
INTRAVENOUS | Status: AC
  Administered 2021-09-11: 500 [IU]
  Filled 2021-09-11: qty 5

## 2021-09-11 MED ORDER — HEPARIN SOD (PORK) LOCK FLUSH 100 UNIT/ML IV SOLN
500.0000 [IU] | Freq: Once | INTRAVENOUS | Status: AC | PRN
  Filled 2021-09-11: qty 5

## 2021-09-11 MED ORDER — SODIUM CHLORIDE 0.9 % IV SOLN
Freq: Once | INTRAVENOUS | Status: AC
  Filled 2021-09-11: qty 250

## 2021-09-11 MED ORDER — SODIUM CHLORIDE 0.9 % IV SOLN
1500.0000 mg | Freq: Once | INTRAVENOUS | Status: AC
  Administered 2021-09-11: 1500 mg via INTRAVENOUS
  Filled 2021-09-11: qty 30

## 2021-09-11 NOTE — Progress Notes (Signed)
Pt in for follow up, states she has had sinus congestion.

## 2021-09-11 NOTE — Patient Instructions (Signed)
[facility] CANCER CTR AT [place]  Discharge Instructions: Thank you for choosing [facility] to provide your oncology and hematology care.  If you have a lab appointment with the [facility], please go directly to the [facility] and check in at the registration area.  Wear comfortable clothing and clothing appropriate for easy access to any Portacath or PICC line.   We strive to give you quality time with your provider. You may need to reschedule your appointment if you arrive late (15 or more minutes).  Arriving late affects you and other patients whose appointments are after yours.  Also, if you miss three or more appointments without notifying the office, you may be dismissed from the clinic at the providers discretion.      For prescription refill requests, have your pharmacy contact our office and allow 72 hours for refills to be completed.    Today you received the following chemotherapy and/or immunotherapy agents Imfiniz    To help prevent nausea and vomiting after your treatment, we encourage you to take your nausea medication as directed.  BELOW ARE SYMPTOMS THAT SHOULD BE REPORTED IMMEDIATELY: *FEVER GREATER THAN 100.4 F (38 C) OR HIGHER *CHILLS OR SWEATING *NAUSEA AND VOMITING THAT IS NOT CONTROLLED WITH YOUR NAUSEA MEDICATION *UNUSUAL SHORTNESS OF BREATH *UNUSUAL BRUISING OR BLEEDING *URINARY PROBLEMS (pain or burning when urinating, or frequent urination) *BOWEL PROBLEMS (unusual diarrhea, constipation, pain near the anus) TENDERNESS IN MOUTH AND THROAT WITH OR WITHOUT PRESENCE OF ULCERS (sore throat, sores in mouth, or a toothache) UNUSUAL RASH, SWELLING OR PAIN  UNUSUAL VAGINAL DISCHARGE OR ITCHING   Items with * indicate a potential emergency and should be followed up as soon as possible or go to the Emergency Department if any problems should occur.  Please show the CHEMOTHERAPY ALERT CARD or IMMUNOTHERAPY ALERT CARD at check-in to the  Emergency Department and triage nurse.  Should you have questions after your visit or need to cancel or reschedule your appointment, please contact [facility] CANCER [place] AT [place]  [phone] and follow the prompts.  Office hours are 8:00 a.m. to 4:30 p.m. Monday - Friday. Please note that voicemails left after 4:00 p.m. may not be returned until the following business day.  We are closed weekends and major holidays. You have access to a nurse at all times for urgent questions. Please call the main number to the clinic [phone] and follow the prompts.  For any non-urgent questions, you may also contact your provider using MyChart. We now offer e-Visits for anyone 49 and older to request care online for non-urgent symptoms. For details visit [email or web address].   Also download the MyChart app! Go to the app store, search "MyChart", open the app, select [facility], and log in with your MyChart username and password.  Due to Covid, a mask is required upon entering the hospital/clinic. If you do not have a mask, one will be given to you upon arrival. For doctor visits, patients may have 1 support person aged 42 or older with them. For treatment visits, patients cannot have anyone with them due to current Covid guidelines and our immunocompromised population.

## 2021-09-11 NOTE — Progress Notes (Signed)
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  She quit smoking about 26 years ago.  Other medical problems include hypertension and hyponatremia.She has been treated for iron of possible pneumonia for the last 1 year.  She has a history of intermittent asthma for which she uses Advair.  She was seen by Dr. And underwent CT chest which showed dense infiltrate/solid mass in the right lower lobe with contiguous airspace opacity in the right upper lobe.  Findings could be secondary to pneumonia but concerning for bronchogenic carcinoma.  Patient then had a PET CT scan which showed consolidation in the medial aspect of the right lower lobe measuring 3.4 x 4 cm with an SUV of 11.6.  Hypermetabolic masslike thickening in the right hilum measuring 2.2 cm with intense hypermetabolic activity.  Groundglass airspace consolidation in the posterior aspect of the right upper lobe with an SUV of 11.4.  The right upper lobe opacity was nonspecific and differentials include pulmonary infection versus lymphangitic spread of carcinoma.  CT chest showed showed masslike area of distortion involving right lower lobe middle lobe as well as right upper lobe.  Generalized right hilar masslike appearance concerning for nodal involvement.   Patient underwent bronchoscopy and biopsies.Right lower lobe was concerning for adenocarcinoma.  Lymph node station  11 R, 4R, 7 were negative for malignancy.  No malignant cells identified in the right upper lobe.  Case was discussed at tumor board and given the hypermetabolism in the right upper lobe as well as hilum involvement cannot be ruled out despite negative biopsies.  Patient completed concurrent chemoradiation with weekly CarboTaxol in July 2022.  Scan showed partial response.  Maintenance durvalumab started on 02/27/2021    Interval history-patient has ongoing problems with sinus congestion as well as dry nonproductive cough and wheezing which has remained unchanged.  Otherwise tolerating durvalumab well without any significant side effects  ECOG PS- 1 Pain scale- 0   Review of systems- Review of Systems  Constitutional:  Positive for malaise/fatigue. Negative for chills, fever and weight loss.  HENT:  Positive for congestion. Negative for ear discharge and nosebleeds.   Eyes:  Negative for blurred vision.  Respiratory:  Positive for cough. Negative for hemoptysis, sputum production, shortness of breath and wheezing.   Cardiovascular:  Negative for chest pain, palpitations, orthopnea and claudication.  Gastrointestinal:  Negative for abdominal pain, blood in stool, constipation, diarrhea, heartburn, melena, nausea and vomiting.  Genitourinary:  Negative for dysuria, flank pain, frequency, hematuria and urgency.  Musculoskeletal:  Negative for back pain, joint pain and myalgias.  Skin:  Negative for rash.  Neurological:  Negative for dizziness, tingling, focal weakness, seizures, weakness and headaches.  Endo/Heme/Allergies:  Does not bruise/bleed easily.  Psychiatric/Behavioral:  Negative for depression and suicidal ideas. The patient does not have insomnia.       Allergies  Allergen Reactions   Ace Inhibitors Cough     Past Medical History:  Diagnosis Date  Anxiety    Asthma    Cancer ([place])    Cough    Dyspnea    with exertion   GERD (gastroesophageal reflux disease)     Hypertension    Lung cancer [facility])      Past Surgical History:  Procedure Laterality Date   BUNIONECTOMY Bilateral    COLONOSCOPY     COLONOSCOPY WITH PROPOFOL N/A 07/30/2021   Procedure: COLONOSCOPY WITH PROPOFOL;  Surgeon: Annamaria Helling, DO;  Location: [facility] ENDOSCOPY;  Service: Gastroenterology;  Laterality: N/A;   ESOPHAGOGASTRODUODENOSCOPY  04/01/2006   PORTA CATH INSERTION N/A 12/22/2020   Procedure: PORTA CATH INSERTION;  Surgeon: Algernon Huxley, MD;  Location: [place] CV LAB;  Service: Cardiovascular;  Laterality: N/A;   VIDEO BRONCHOSCOPY WITH ENDOBRONCHIAL NAVIGATION N/A 11/07/2020   Procedure: VIDEO BRONCHOSCOPY WITH ENDOBRONCHIAL NAVIGATION;  Surgeon: Ottie Glazier, MD;  Location: [facility] ORS;  Service: Thoracic;  Laterality: N/A;   VIDEO BRONCHOSCOPY WITH ENDOBRONCHIAL ULTRASOUND N/A 11/07/2020   Procedure: VIDEO BRONCHOSCOPY WITH ENDOBRONCHIAL ULTRASOUND;  Surgeon: Ottie Glazier, MD;  Location: [facility] ORS;  Service: Thoracic;  Laterality: N/A;    Social History   Socioeconomic History   Marital status: Married    Spouse name: Not on file   Number of children: Not on file   Years of education: Not on file   Highest education level: Not on file  Occupational History   Not on file  Tobacco Use   Smoking status: Former    Packs/day: 1.50    Years: 26.80    Pack years: 40.20    Types: Cigarettes    Quit date: 11/07/1994    Years since quitting: 26.8   Smokeless tobacco: Never  Vaping Use   Vaping Use: Never used  Substance and Sexual Activity   Alcohol use: Not Currently    Comment:  wine daily   Drug use: Never   Sexual activity: Not on file  Other Topics Concern   Not on file  Social History Narrative   Not on file   Social Determinants of Health   Financial Resource Strain: Not on file  Food Insecurity: Not on file  Transportation Needs: Not on file  Physical Activity: Not on file  Stress: Not on file  Social Connections: Not on file   Intimate Partner Violence: Not on file    History reviewed. No pertinent family history.   Current Outpatient Medications:    acetaminophen (TYLENOL) 500 MG tablet, Take 500 mg by mouth every 6 (six) hours as needed., Disp: , Rfl:    ADVAIR DISKUS 250-50 MCG/DOSE AEPB, Inhale 1 puff into the lungs as needed., Disp: , Rfl:    ALPRAZolam (XANAX) 0.5 MG tablet, Take 0.5 mg by mouth at bedtime as needed for sleep., Disp: , Rfl:    amLODipine (NORVASC) 5 MG tablet, Take 5 mg by mouth at bedtime., Disp: , Rfl:    azelastine (ASTELIN) 0.1 % nasal spray, Place 2 sprays into both nostrils 2 (two) times daily. Use in each nostril as directed, Disp: 30 mL, Rfl: 2   Cholecalciferol 50 MCG (2000 UT) CAPS, Take 2,000 Units by mouth daily., Disp: , Rfl:    DURVALUMAB IV, Inject into the vein every 14 (fourteen) days. For lung cancer, Disp: , Rfl:    esomeprazole (NEXIUM) 20 MG capsule, Take 20 mg by mouth as needed., Disp: , Rfl:    fluticasone (FLONASE) 50 MCG/ACT nasal spray, Place 2 sprays into both nostrils daily as needed for rhinitis., Disp: ,  Rfl:    metoprolol succinate (TOPROL-XL) 50 MG 24 hr tablet, Take 50 mg by mouth every morning., Disp: , Rfl:    zolpidem (AMBIEN) 10 MG tablet, Take 10 mg by mouth at bedtime as needed for sleep., Disp: , Rfl:    docusate sodium (COLACE) 100 MG capsule, Take 100 mg by mouth daily. (Patient not taking: Reported on 07/31/2021), Disp: , Rfl:  No current facility-administered medications for this visit.  Facility-Administered Medications Ordered in Other Visits:    heparin lock flush 100 UNIT/ML injection, , , ,    heparin lock flush 100 UNIT/ML injection, , , ,    heparin lock flush 100 UNIT/ML injection, , , ,    heparin lock flush 100 UNIT/ML injection, , , ,    sodium chloride flush (NS) 0.9 % injection 10 mL, 10 mL, Intravenous, Once, Sindy Guadeloupe, MD   sodium chloride flush (NS) 0.9 % injection 10 mL, 10 mL, Intravenous, PRN, Sindy Guadeloupe, MD, 10 mL  at 01/26/21 0807  Physical exam:  Vitals:   09/11/21 0957  BP: 128/75  Pulse: 92  Resp: 16  Temp: (!) 97.5 F (36.4 C)  TempSrc: Tympanic  SpO2: 100%  Weight: 143 lb 9.6 oz (65.1 kg)   Physical Exam Constitutional:      General: She is not in acute distress. Cardiovascular:     Rate and Rhythm: Normal rate and regular rhythm.     Heart sounds: Normal heart sounds.  Pulmonary:     Effort: Pulmonary effort is normal.     Comments: Mild scattered bilateral wheezing Abdominal:     General: Bowel sounds are normal.     Palpations: Abdomen is soft.  Skin:    General: Skin is warm and dry.  Neurological:     Mental Status: She is alert and oriented to person, place, and time.     CMP Latest Ref Rng & Units 09/11/2021  Glucose 70 - 99 mg/dL 119(H)  BUN 8 - 23 mg/dL 8  Creatinine 0.44 - 1.00 mg/dL 0.68  Sodium 135 - 145 mmol/L 133(L)  Potassium 3.5 - 5.1 mmol/L 3.8  Chloride 98 - 111 mmol/L 99  CO2 22 - 32 mmol/L 25  Calcium 8.9 - 10.3 mg/dL 9.2  Total Protein 6.5 - 8.1 g/dL 7.6  Total Bilirubin 0.3 - 1.2 mg/dL 0.1(L)  Alkaline Phos 38 - 126 U/L 62  AST 15 - 41 U/L 23  ALT 0 - 44 U/L 14   CBC Latest Ref Rng & Units 09/11/2021  WBC 4.0 - 10.5 K/uL 3.9(L)  Hemoglobin 12.0 - 15.0 g/dL 12.1  Hematocrit 36.0 - 46.0 % 35.6(L)  Platelets 150 - 400 K/uL 270     Assessment and plan- Patient is a 66 y.o. female with adenocarcinoma of the right lung clinical stage III acT3 N1 M0.  She is here for on treatment assessment prior to cycle 14 of maintenance durvalumab  Counts okay to proceed with cycle 14 of maintenance durvalumab today and I will see her back in 4 weeks for cycle 15.  She is presently receiving the 1500 mg dose every month.  We will plan to repeat CT chest abdomen pelvis with contrast early next month.  Patient is tolerating immunotherapy well so far without any significant side effects.  TSH is within normal limits.  Mild leukopenia: ANC is normal at 2.2.  Continue  to monitor   Visit Diagnosis 1. Encounter for antineoplastic immunotherapy   2. Malignant neoplasm of  lower lobe of right lung [facility])      Dr. Randa Evens, MD, MPH [facility] [phone] 09/11/2021 1:13 PM

## 2021-09-17 ENCOUNTER — Other Ambulatory Visit: Payer: Self-pay | Admitting: Internal Medicine

## 2021-09-17 DIAGNOSIS — C3491 Malignant neoplasm of unspecified part of right bronchus or lung: Secondary | ICD-10-CM | POA: Insufficient documentation

## 2021-09-17 DIAGNOSIS — Z1231 Encounter for screening mammogram for malignant neoplasm of breast: Secondary | ICD-10-CM

## 2021-09-21 ENCOUNTER — Ambulatory Visit: Payer: BC Managed Care – PPO | Admitting: Dermatology

## 2021-09-23 ENCOUNTER — Other Ambulatory Visit: Payer: Self-pay

## 2021-09-23 ENCOUNTER — Encounter: Payer: Self-pay | Admitting: Radiation Oncology

## 2021-09-23 ENCOUNTER — Ambulatory Visit
Admission: RE | Admit: 2021-09-23 | Discharge: 2021-09-23 | Disposition: A | Discharge status: Home / Self Care | Payer: BC Managed Care – PPO | Source: Ambulatory Visit | Attending: Radiation Oncology | Admitting: Radiation Oncology

## 2021-09-23 VITALS — BP 145/64 | HR 84 | Temp 97.0°F | Resp 16 | Wt 141.4 lb

## 2021-09-23 DIAGNOSIS — Z9221 Personal history of antineoplastic chemotherapy: Secondary | ICD-10-CM | POA: Insufficient documentation

## 2021-09-23 DIAGNOSIS — C3431 Malignant neoplasm of lower lobe, right bronchus or lung: Secondary | ICD-10-CM | POA: Insufficient documentation

## 2021-09-23 DIAGNOSIS — Z923 Personal history of irradiation: Secondary | ICD-10-CM | POA: Diagnosis not present

## 2021-09-23 DIAGNOSIS — C3490 Malignant neoplasm of unspecified part of unspecified bronchus or lung: Secondary | ICD-10-CM

## 2021-09-23 NOTE — Progress Notes (Signed)
Radiation Oncology Follow up Note  Name: Katherine Perry   Date:   09/23/2021 MRN:  [id number] DOB: 1955-09-04    This 66 y.o. female presents to the clinic today for 70-month follow-up status post concurrent chemoradiation therapy for stage III (T3 N1 M0) adenocarcinoma the right lung.  REFERRING PROVIDER: Rusty Aus, MD  HPI: Patient is a 66 year old female now about 8 months having completed concurrent chemoradiation therapy for stage IIIa adenocarcinoma the right lung.  Seen today in routine follow-up she is doing well.  She has a slight nonproductive cough no hemoptysis or chest tightness.  She had a recent CT scan back in December.  Showing some consolidation and fibrosis is in the perihilar right lung consistent with radiation change.  Other areas of consolidation and groundglass airspace opacity in the dependent right lung have cleared.  She is currently on maintenance durvalumab which she is tolerating well.  She has another CT scan planned next month.  COMPLICATIONS OF TREATMENT: none  FOLLOW UP COMPLIANCE: keeps appointments   PHYSICAL EXAM:  BP (!) 145/64 (BP Location: Left Arm, Patient Position: Sitting)    Pulse 84    Temp (!) 97 F (36.1 C) (Tympanic)    Resp 16    Wt 141 lb 6.4 oz (64.1 kg)    BMI 23.53 kg/m  Well-developed well-nourished patient in NAD. HEENT reveals PERLA, EOMI, discs not visualized.  Oral cavity is clear. No oral mucosal lesions are identified. Neck is clear without evidence of cervical or supraclavicular adenopathy. Lungs are clear to A&P. Cardiac examination is essentially unremarkable with regular rate and rhythm without murmur rub or thrill. Abdomen is benign with no organomegaly or masses noted. Motor sensory and DTR levels are equal and symmetric in the upper and lower extremities. Cranial nerves II through XII are grossly intact. Proprioception is intact. No peripheral adenopathy or edema is identified. No motor or sensory levels are noted. Crude visual  fields are within normal range.  RADIOLOGY RESULTS: CT scans reviewed compatible with above-stated findings  PLAN: Present time patient is doing well with no evidence of disease now out 8 months.  I am pleased with her overall progress.  Of asked to see her back in 6 months for follow-up.  We will review her CT scans as they become available.  Patient continues close follow-up care and treatment through medical oncology.  I would like to take this opportunity to thank you for allowing me to participate in the care of your patient.Noreene Filbert, MD

## 2021-09-30 ENCOUNTER — Ambulatory Visit
Admission: RE | Admit: 2021-09-30 | Discharge: 2021-09-30 | Disposition: A | Discharge status: Home / Self Care | Payer: BC Managed Care – PPO | Source: Ambulatory Visit | Attending: Oncology | Admitting: Oncology

## 2021-09-30 ENCOUNTER — Other Ambulatory Visit: Payer: Self-pay

## 2021-09-30 DIAGNOSIS — C3431 Malignant neoplasm of lower lobe, right bronchus or lung: Secondary | ICD-10-CM | POA: Diagnosis not present

## 2021-09-30 MED ORDER — HEPARIN SOD (PORK) LOCK FLUSH 100 UNIT/ML IV SOLN
INTRAVENOUS | Status: AC
  Administered 2021-09-30: 500 [IU]
  Filled 2021-09-30: qty 5

## 2021-09-30 MED ORDER — HEPARIN SOD (PORK) LOCK FLUSH 100 UNIT/ML IV SOLN
500.0000 [IU] | Freq: Once | INTRAVENOUS | Status: DC
  Filled 2021-09-30: qty 5

## 2021-09-30 MED ORDER — IOHEXOL 300 MG/ML  SOLN
100.0000 mL | Freq: Once | INTRAMUSCULAR | Status: AC | PRN
  Administered 2021-09-30: 100 mL via INTRAVENOUS

## 2021-10-09 ENCOUNTER — Inpatient Hospital Stay: Payer: BC Managed Care – PPO | Attending: Oncology

## 2021-10-09 ENCOUNTER — Ambulatory Visit: Payer: BC Managed Care – PPO | Admitting: Oncology

## 2021-10-09 ENCOUNTER — Inpatient Hospital Stay (HOSPITAL_BASED_OUTPATIENT_CLINIC_OR_DEPARTMENT_OTHER): Payer: BC Managed Care – PPO | Admitting: Oncology

## 2021-10-09 ENCOUNTER — Encounter: Payer: Self-pay | Admitting: Oncology

## 2021-10-09 ENCOUNTER — Other Ambulatory Visit: Payer: Self-pay

## 2021-10-09 ENCOUNTER — Inpatient Hospital Stay: Payer: BC Managed Care – PPO

## 2021-10-09 ENCOUNTER — Other Ambulatory Visit: Payer: BC Managed Care – PPO

## 2021-10-09 ENCOUNTER — Ambulatory Visit: Payer: BC Managed Care – PPO

## 2021-10-09 VITALS — HR 98

## 2021-10-09 VITALS — BP 106/73 | HR 105 | Temp 96.6°F | Resp 16 | Wt 142.8 lb

## 2021-10-09 DIAGNOSIS — C3431 Malignant neoplasm of lower lobe, right bronchus or lung: Secondary | ICD-10-CM

## 2021-10-09 DIAGNOSIS — Z5112 Encounter for antineoplastic immunotherapy: Secondary | ICD-10-CM | POA: Insufficient documentation

## 2021-10-09 DIAGNOSIS — Z79899 Other long term (current) drug therapy: Secondary | ICD-10-CM | POA: Diagnosis not present

## 2021-10-09 LAB — CBC WITH DIFFERENTIAL/PLATELET
Abs Immature Granulocytes: 0.03 10*3/uL (ref 0.00–0.07)
Basophils Absolute: 0 10*3/uL (ref 0.0–0.1)
Basophils Relative: 1 %
Eosinophils Absolute: 0.1 10*3/uL (ref 0.0–0.5)
Eosinophils Relative: 2 %
HCT: 33.8 % — ABNORMAL LOW (ref 36.0–46.0)
Hemoglobin: 11.3 g/dL — ABNORMAL LOW (ref 12.0–15.0)
Immature Granulocytes: 1 %
Lymphocytes Relative: 14 %
Lymphs Abs: 0.9 10*3/uL (ref 0.7–4.0)
MCH: 32.5 pg (ref 26.0–34.0)
MCHC: 33.4 g/dL (ref 30.0–36.0)
MCV: 97.1 fL (ref 80.0–100.0)
Monocytes Absolute: 0.6 10*3/uL (ref 0.1–1.0)
Monocytes Relative: 10 %
Neutro Abs: 4.7 10*3/uL (ref 1.7–7.7)
Neutrophils Relative %: 72 %
Platelets: 354 10*3/uL (ref 150–400)
RBC: 3.48 MIL/uL — ABNORMAL LOW (ref 3.87–5.11)
RDW: 12.9 % (ref 11.5–15.5)
WBC: 6.4 10*3/uL (ref 4.0–10.5)
nRBC: 0 % (ref 0.0–0.2)

## 2021-10-09 LAB — COMPREHENSIVE METABOLIC PANEL
ALT: 13 U/L (ref 0–44)
AST: 21 U/L (ref 15–41)
Albumin: 3.5 g/dL (ref 3.5–5.0)
Alkaline Phosphatase: 72 U/L (ref 38–126)
Anion gap: 10 (ref 5–15)
BUN: 9 mg/dL (ref 8–23)
CO2: 26 mmol/L (ref 22–32)
Calcium: 9.3 mg/dL (ref 8.9–10.3)
Chloride: 96 mmol/L — ABNORMAL LOW (ref 98–111)
Creatinine, Ser: 0.54 mg/dL (ref 0.44–1.00)
GFR, Estimated: >60 mL/min (ref >60)
Glucose, Bld: 151 mg/dL — ABNORMAL HIGH (ref 70–99)
Potassium: 3.4 mmol/L — ABNORMAL LOW (ref 3.5–5.1)
Sodium: 132 mmol/L — ABNORMAL LOW (ref 135–145)
Total Bilirubin: 0.2 mg/dL — ABNORMAL LOW (ref 0.3–1.2)
Total Protein: 7.6 g/dL (ref 6.5–8.1)

## 2021-10-09 MED ORDER — HEPARIN SOD (PORK) LOCK FLUSH 100 UNIT/ML IV SOLN
500.0000 [IU] | Freq: Once | INTRAVENOUS | Status: AC | PRN
  Administered 2021-10-09: 500 [IU]
  Filled 2021-10-09: qty 5

## 2021-10-09 MED ORDER — SODIUM CHLORIDE 0.9 % IV SOLN
1500.0000 mg | Freq: Once | INTRAVENOUS | Status: AC
  Administered 2021-10-09: 1500 mg via INTRAVENOUS
  Filled 2021-10-09: qty 30

## 2021-10-09 MED ORDER — SODIUM CHLORIDE 0.9 % IV SOLN
Freq: Once | INTRAVENOUS | Status: AC
  Filled 2021-10-09: qty 250

## 2021-10-09 NOTE — Progress Notes (Signed)
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  She quit smoking about 26 years ago.  Other medical problems include hypertension and hyponatremia.She has been treated for iron of possible pneumonia for the last 1 year.  She has a history of intermittent asthma for which she uses Advair.  She was seen by Dr. And underwent CT chest which showed dense infiltrate/solid mass in the right lower lobe with contiguous airspace opacity in the right upper lobe.  Findings could be secondary to pneumonia but concerning for bronchogenic carcinoma.  Patient then had a PET CT scan which showed consolidation in the medial aspect of the right lower lobe measuring 3.4 x 4 cm with an SUV of 11.6.  Hypermetabolic masslike thickening in the right hilum measuring 2.2 cm with intense hypermetabolic activity.  Groundglass airspace consolidation in the posterior aspect of the right upper lobe with an SUV of 11.4.  The right upper lobe opacity was nonspecific and differentials include pulmonary infection versus lymphangitic spread of carcinoma.  CT chest showed showed masslike area of distortion involving right lower lobe middle lobe as well as right upper lobe.  Generalized right hilar masslike appearance concerning for nodal involvement.   Patient underwent bronchoscopy and biopsies.Right lower lobe was concerning for adenocarcinoma.  Lymph node  station 11 R, 4R, 7 were negative for malignancy.  No malignant cells identified in the right upper lobe.  Case was discussed at tumor board and given the hypermetabolism in the right upper lobe as well as hilum involvement cannot be ruled out despite negative biopsies.  Patient completed concurrent chemoradiation with weekly CarboTaxol in July 2022.  Scan showed partial response.  Maintenance durvalumab started on 02/27/2021    Interval history-has chronic nonproductive cough which has remained unchanged.  Has baseline fatigue  ECOG PS- 1 Pain scale- 0   Review of systems- Review of Systems  Constitutional:  Positive for malaise/fatigue. Negative for chills, fever and weight loss.  HENT:  Negative for congestion, ear discharge and nosebleeds.   Eyes:  Negative for blurred vision.  Respiratory:  Positive for cough. Negative for hemoptysis, sputum production, shortness of breath and wheezing.   Cardiovascular:  Negative for chest pain, palpitations, orthopnea and claudication.  Gastrointestinal:  Negative for abdominal pain, blood in stool, constipation, diarrhea, heartburn, melena, nausea and vomiting.  Genitourinary:  Negative for dysuria, flank pain, frequency, hematuria and urgency.  Musculoskeletal:  Negative for back pain, joint pain and myalgias.  Skin:  Negative for rash.  Neurological:  Negative for dizziness, tingling, focal weakness, seizures, weakness and headaches.  Endo/Heme/Allergies:  Does not bruise/bleed easily.  Psychiatric/Behavioral:  Negative for depression and suicidal ideas. The patient does not have insomnia.      Allergies  Allergen Reactions   Ace Inhibitors Cough     Past Medical History:  Diagnosis Date   Anxiety    Asthma    Cancer ([place])    Cough  Dyspnea    with exertion   GERD (gastroesophageal reflux disease)    Hypertension    Lung cancer [facility])      Past Surgical History:  Procedure Laterality Date   BUNIONECTOMY Bilateral     COLONOSCOPY     COLONOSCOPY WITH PROPOFOL N/A 07/30/2021   Procedure: COLONOSCOPY WITH PROPOFOL;  Surgeon: Annamaria Helling, DO;  Location: [facility] ENDOSCOPY;  Service: Gastroenterology;  Laterality: N/A;   ESOPHAGOGASTRODUODENOSCOPY  04/01/2006   PORTA CATH INSERTION N/A 12/22/2020   Procedure: PORTA CATH INSERTION;  Surgeon: Algernon Huxley, MD;  Location: [place] CV LAB;  Service: Cardiovascular;  Laterality: N/A;   VIDEO BRONCHOSCOPY WITH ENDOBRONCHIAL NAVIGATION N/A 11/07/2020   Procedure: VIDEO BRONCHOSCOPY WITH ENDOBRONCHIAL NAVIGATION;  Surgeon: Ottie Glazier, MD;  Location: [facility] ORS;  Service: Thoracic;  Laterality: N/A;   VIDEO BRONCHOSCOPY WITH ENDOBRONCHIAL ULTRASOUND N/A 11/07/2020   Procedure: VIDEO BRONCHOSCOPY WITH ENDOBRONCHIAL ULTRASOUND;  Surgeon: Ottie Glazier, MD;  Location: [facility] ORS;  Service: Thoracic;  Laterality: N/A;    Social History   Socioeconomic History   Marital status: Married    Spouse name: Not on file   Number of children: Not on file   Years of education: Not on file   Highest education level: Not on file  Occupational History   Not on file  Tobacco Use   Smoking status: Former    Packs/day: 1.50    Years: 26.80    Pack years: 40.20    Types: Cigarettes    Quit date: 11/07/1994    Years since quitting: 26.9   Smokeless tobacco: Never  Vaping Use   Vaping Use: Never used  Substance and Sexual Activity   Alcohol use: Not Currently    Comment:  wine daily   Drug use: Never   Sexual activity: Not on file  Other Topics Concern   Not on file  Social History Narrative   Not on file   Social Determinants of Health   Financial Resource Strain: Not on file  Food Insecurity: Not on file  Transportation Needs: Not on file  Physical Activity: Not on file  Stress: Not on file  Social Connections: Not on file  Intimate Partner Violence: Not on file    History reviewed. No pertinent family history.   Current Outpatient Medications:     acetaminophen (TYLENOL) 500 MG tablet, Take 500 mg by mouth every 6 (six) hours as needed., Disp: , Rfl:    ADVAIR DISKUS 250-50 MCG/DOSE AEPB, Inhale 1 puff into the lungs as needed., Disp: , Rfl:    ALPRAZolam (XANAX) 0.5 MG tablet, Take 0.5 mg by mouth at bedtime as needed for sleep., Disp: , Rfl:    amLODipine (NORVASC) 5 MG tablet, Take 5 mg by mouth at bedtime., Disp: , Rfl:    azelastine (ASTELIN) 0.1 % nasal spray, Place 2 sprays into both nostrils 2 (two) times daily. Use in each nostril as directed, Disp: 30 mL, Rfl: 2   Cholecalciferol 50 MCG (2000 UT) CAPS, Take 2,000 Units by mouth daily., Disp: , Rfl:    esomeprazole (NEXIUM) 20 MG capsule, Take 20 mg by mouth as needed., Disp: , Rfl:    fluticasone (FLONASE) 50 MCG/ACT nasal spray, Place 2 sprays into both nostrils daily as needed for rhinitis., Disp: , Rfl:    metoprolol succinate (TOPROL-XL) 50 MG 24 hr tablet, Take 50 mg by mouth every morning., Disp: , Rfl:    zolpidem (AMBIEN) 10 MG tablet, Take 10 mg by mouth  at bedtime as needed for sleep., Disp: , Rfl:    docusate sodium (COLACE) 100 MG capsule, Take 100 mg by mouth daily. (Patient not taking: Reported on 07/31/2021), Disp: , Rfl:    DURVALUMAB IV, Inject into the vein every 14 (fourteen) days. For lung cancer, Disp: , Rfl:  No current facility-administered medications for this visit.  Facility-Administered Medications Ordered in Other Visits:    heparin lock flush 100 UNIT/ML injection, , , ,    heparin lock flush 100 UNIT/ML injection, , , ,    heparin lock flush 100 UNIT/ML injection, , , ,    heparin lock flush 100 UNIT/ML injection, , , ,    sodium chloride flush (NS) 0.9 % injection 10 mL, 10 mL, Intravenous, Once, Sindy Guadeloupe, MD   sodium chloride flush (NS) 0.9 % injection 10 mL, 10 mL, Intravenous, PRN, Sindy Guadeloupe, MD, 10 mL at 01/26/21 0807  Physical exam:  Vitals:   10/09/21 1344  BP: 106/73  Pulse: (!) 105  Resp: 16  Temp: (!) 96.6 F (35.9  C)  SpO2: 100%  Weight: 142 lb 12.8 oz (64.8 kg)   Physical Exam Constitutional:      General: She is not in acute distress. Cardiovascular:     Rate and Rhythm: Normal rate and regular rhythm.     Heart sounds: Normal heart sounds.  Pulmonary:     Effort: Pulmonary effort is normal.     Breath sounds: Normal breath sounds.  Abdominal:     General: Bowel sounds are normal.     Palpations: Abdomen is soft.  Skin:    General: Skin is warm and dry.  Neurological:     Mental Status: She is alert and oriented to person, place, and time.     CMP Latest Ref Rng & Units 10/09/2021  Glucose 70 - 99 mg/dL 151(H)  BUN 8 - 23 mg/dL 9  Creatinine 0.44 - 1.00 mg/dL 0.54  Sodium 135 - 145 mmol/L 132(L)  Potassium 3.5 - 5.1 mmol/L 3.4(L)  Chloride 98 - 111 mmol/L 96(L)  CO2 22 - 32 mmol/L 26  Calcium 8.9 - 10.3 mg/dL 9.3  Total Protein 6.5 - 8.1 g/dL 7.6  Total Bilirubin 0.3 - 1.2 mg/dL 0.2(L)  Alkaline Phos 38 - 126 U/L 72  AST 15 - 41 U/L 21  ALT 0 - 44 U/L 13   CBC Latest Ref Rng & Units 10/09/2021  WBC 4.0 - 10.5 K/uL 6.4  Hemoglobin 12.0 - 15.0 g/dL 11.3(L)  Hematocrit 36.0 - 46.0 % 33.8(L)  Platelets 150 - 400 K/uL 354    No images are attached to the encounter.  CT CHEST ABDOMEN PELVIS W CONTRAST  Result Date: 10/01/2021 CLINICAL DATA:  Right lower lobe lung cancer.  Restaging. EXAM: CT CHEST, ABDOMEN, AND PELVIS WITH CONTRAST TECHNIQUE: Multidetector CT imaging of the chest, abdomen and pelvis was performed following the standard protocol during bolus administration of intravenous contrast. RADIATION DOSE REDUCTION: This exam was performed according to the departmental dose-optimization program which includes automated exposure control, adjustment of the mA and/or kV according to patient size and/or use of iterative reconstruction technique. CONTRAST:  149mL OMNIPAQUE IOHEXOL 300 MG/ML  SOLN COMPARISON:  07/10/2021 FINDINGS: CT CHEST FINDINGS Cardiovascular: The heart size is  normal. No substantial pericardial effusion. Coronary artery calcification is evident. Mild atherosclerotic calcification is noted in the wall of the thoracic aorta. Right Port-A-Cath tip is positioned in the upper right atrium. Mediastinum/Nodes: Stable small mediastinal nodes.  8 mm short axis right paratracheal node measured previously is 6 mm today. No left hilar lymphadenopathy. The esophagus has normal imaging features. There is no axillary lymphadenopathy. Amorphous soft tissue in the inferior right hilum is similar to prior compatible with treatment related changes. Lungs/Pleura: Volume loss and consolidative opacity in the posterior parahilar right lung, mainly involving the lower lobe is minimally progressive with some more confluent opacity inferiorly. Small cavitary component noted previously has decreased in the interval. No suspicious pulmonary nodule or mass. No focal airspace consolidation. No pleural effusion. Musculoskeletal: No worrisome lytic or sclerotic osseous abnormality. CT ABDOMEN PELVIS FINDINGS Hepatobiliary: No suspicious focal abnormality within the liver parenchyma. There is no evidence for gallstones, gallbladder wall thickening, or pericholecystic fluid. No intrahepatic or extrahepatic biliary dilation. Pancreas: No focal mass lesion. No dilatation of the main duct. No intraparenchymal cyst. No peripancreatic edema. Spleen: No splenomegaly. No focal mass lesion. Adrenals/Urinary Tract: No adrenal nodule or mass. Kidneys unremarkable. No evidence for hydroureter. The urinary bladder appears normal for the degree of distention. Stomach/Bowel: Stomach is nondistended. Duodenum is normally positioned as is the ligament of Treitz. No small bowel wall thickening. No small bowel dilatation. The terminal ileum is normal. The appendix is normal. No gross colonic mass. No colonic wall thickening. Prominent stool volume evident. Vascular/Lymphatic: There is moderate atherosclerotic calcification  of the abdominal aorta without aneurysm. There is no gastrohepatic or hepatoduodenal ligament lymphadenopathy. No retroperitoneal or mesenteric lymphadenopathy. No pelvic sidewall lymphadenopathy. Reproductive: Unremarkable. Other: No intraperitoneal free fluid. Musculoskeletal: No worrisome lytic or sclerotic osseous abnormality. IMPRESSION: 1. Stable exam. No new or progressive findings to suggest recurrent or metastatic disease. 2. Volume loss and consolidative opacity in the posterior parahilar right lung, mainly involving the lower lobe is minimally progressive with some more confluent opacity inferiorly. Findings likely reflect evolution of post treatment changes but continued close attention recommended. PET-CT could be used to further evaluate as clinically warranted. 3. Stable upper normal to borderline enlarged paratracheal nodes. 4. Prominent stool volume. Imaging features could be compatible with clinical constipation. 5. Aortic Atherosclerosis (ICD10-I70.0). Electronically Signed   By: Misty Stanley M.D.   On: 10/01/2021 08:32     Assessment and plan- Patient is a 66 y.o. 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  She is here for on treatment assessment prior to cycle 15 of maintenance durvalumab  Counts okay to proceed with cycle 15 of maintenance durvalumab today.  I will see her back in 4 weeks for cycle 16.  I have reviewed CT chest abdomen and pelvis images independently and showed images to patient and her husband.  There is an area of consolidative opacity in the right lower lobe which has remained overall stable.  It is likely a combination of inflammation and underlying radiation changes.  No progressive changes noted on CT scan suggestive of disease recurrence/progression.  I am therefore inclined to continue durvalumab every 4 weeks ending in July 2023.   Visit Diagnosis 1. Malignant neoplasm of lower lobe of right lung ([place])   2. Encounter for  antineoplastic immunotherapy      Dr. Randa Evens, MD, MPH [facility] [phone] 10/09/2021 4:19 PM

## 2021-10-22 ENCOUNTER — Ambulatory Visit
Admission: RE | Admit: 2021-10-22 | Discharge: 2021-10-22 | Disposition: A | Discharge status: Home / Self Care | Payer: BC Managed Care – PPO | Source: Ambulatory Visit | Attending: Internal Medicine | Admitting: Internal Medicine

## 2021-10-22 ENCOUNTER — Other Ambulatory Visit: Payer: Self-pay

## 2021-10-22 DIAGNOSIS — Z1231 Encounter for screening mammogram for malignant neoplasm of breast: Secondary | ICD-10-CM | POA: Diagnosis present

## 2021-11-05 ENCOUNTER — Other Ambulatory Visit: Payer: Self-pay | Admitting: *Deleted

## 2021-11-05 DIAGNOSIS — C3431 Malignant neoplasm of lower lobe, right bronchus or lung: Secondary | ICD-10-CM

## 2021-11-06 ENCOUNTER — Inpatient Hospital Stay (HOSPITAL_BASED_OUTPATIENT_CLINIC_OR_DEPARTMENT_OTHER): Payer: BC Managed Care – PPO | Admitting: Oncology

## 2021-11-06 ENCOUNTER — Ambulatory Visit: Payer: BC Managed Care – PPO | Admitting: Oncology

## 2021-11-06 ENCOUNTER — Encounter: Payer: Self-pay | Admitting: Oncology

## 2021-11-06 ENCOUNTER — Ambulatory Visit: Payer: BC Managed Care – PPO

## 2021-11-06 ENCOUNTER — Inpatient Hospital Stay: Payer: BC Managed Care – PPO

## 2021-11-06 ENCOUNTER — Other Ambulatory Visit: Payer: BC Managed Care – PPO

## 2021-11-06 ENCOUNTER — Inpatient Hospital Stay: Payer: BC Managed Care – PPO | Attending: Oncology

## 2021-11-06 VITALS — BP 144/71 | HR 92 | Temp 97.7°F | Resp 16 | Wt 141.9 lb

## 2021-11-06 DIAGNOSIS — Z5112 Encounter for antineoplastic immunotherapy: Secondary | ICD-10-CM | POA: Insufficient documentation

## 2021-11-06 DIAGNOSIS — C3431 Malignant neoplasm of lower lobe, right bronchus or lung: Secondary | ICD-10-CM

## 2021-11-06 DIAGNOSIS — Z79899 Other long term (current) drug therapy: Secondary | ICD-10-CM | POA: Diagnosis not present

## 2021-11-06 LAB — CBC WITH DIFFERENTIAL/PLATELET
Abs Immature Granulocytes: 0.01 10*3/uL (ref 0.00–0.07)
Basophils Absolute: 0 10*3/uL (ref 0.0–0.1)
Basophils Relative: 1 %
Eosinophils Absolute: 0.4 10*3/uL (ref 0.0–0.5)
Eosinophils Relative: 7 %
HCT: 34.9 % — ABNORMAL LOW (ref 36.0–46.0)
Hemoglobin: 11.7 g/dL — ABNORMAL LOW (ref 12.0–15.0)
Immature Granulocytes: 0 %
Lymphocytes Relative: 21 %
Lymphs Abs: 1.1 10*3/uL (ref 0.7–4.0)
MCH: 32.6 pg (ref 26.0–34.0)
MCHC: 33.5 g/dL (ref 30.0–36.0)
MCV: 97.2 fL (ref 80.0–100.0)
Monocytes Absolute: 0.6 10*3/uL (ref 0.1–1.0)
Monocytes Relative: 11 %
Neutro Abs: 3.2 10*3/uL (ref 1.7–7.7)
Neutrophils Relative %: 60 %
Platelets: 254 10*3/uL (ref 150–400)
RBC: 3.59 MIL/uL — ABNORMAL LOW (ref 3.87–5.11)
RDW: 14.5 % (ref 11.5–15.5)
WBC: 5.3 10*3/uL (ref 4.0–10.5)
nRBC: 0 % (ref 0.0–0.2)

## 2021-11-06 LAB — COMPREHENSIVE METABOLIC PANEL
ALT: 17 U/L (ref 0–44)
AST: 26 U/L (ref 15–41)
Albumin: 4 g/dL (ref 3.5–5.0)
Alkaline Phosphatase: 60 U/L (ref 38–126)
Anion gap: 8 (ref 5–15)
BUN: 9 mg/dL (ref 8–23)
CO2: 23 mmol/L (ref 22–32)
Calcium: 9.1 mg/dL (ref 8.9–10.3)
Chloride: 99 mmol/L (ref 98–111)
Creatinine, Ser: 0.6 mg/dL (ref 0.44–1.00)
GFR, Estimated: >60 mL/min (ref >60)
Glucose, Bld: 111 mg/dL — ABNORMAL HIGH (ref 70–99)
Potassium: 4 mmol/L (ref 3.5–5.1)
Sodium: 130 mmol/L — ABNORMAL LOW (ref 135–145)
Total Bilirubin: 0.3 mg/dL (ref 0.3–1.2)
Total Protein: 7.5 g/dL (ref 6.5–8.1)

## 2021-11-06 LAB — TSH: TSH: 1.709 u[IU]/mL (ref 0.350–4.500)

## 2021-11-06 MED ORDER — HEPARIN SOD (PORK) LOCK FLUSH 100 UNIT/ML IV SOLN
500.0000 [IU] | Freq: Once | INTRAVENOUS | Status: AC | PRN
  Administered 2021-11-06: 500 [IU]
  Filled 2021-11-06: qty 5

## 2021-11-06 MED ORDER — SODIUM CHLORIDE 0.9 % IV SOLN
Freq: Once | INTRAVENOUS | Status: AC
  Filled 2021-11-06: qty 250

## 2021-11-06 MED ORDER — SODIUM CHLORIDE 0.9 % IV SOLN
1500.0000 mg | Freq: Once | INTRAVENOUS | Status: AC
  Administered 2021-11-06: 1500 mg via INTRAVENOUS
  Filled 2021-11-06: qty 30

## 2021-11-06 NOTE — Patient Instructions (Signed)
[facility]  Discharge Instructions: ?Thank you for choosing [facility] to provide your oncology and hematology care.  ?If you have a lab appointment with the Cancer Center, please go directly to the Cancer Center and check in at the registration area. ? ?Wear comfortable clothing and clothing appropriate for easy access to any Portacath or PICC line.  ? ?We strive to give you quality time with your provider. You may need to reschedule your appointment if you arrive late (15 or more minutes).  Arriving late affects you and other patients whose appointments are after yours.  Also, if you miss three or more appointments without notifying the office, you may be dismissed from the clinic at the provider?s discretion.    ?  ?For prescription refill requests, have your pharmacy contact our office and allow 72 hours for refills to be completed.   ? ?Today you received the following chemotherapy and/or immunotherapy agents VENOFER    ?  ?To help prevent nausea and vomiting after your treatment, we encourage you to take your nausea medication as directed. ? ?BELOW ARE SYMPTOMS THAT SHOULD BE REPORTED IMMEDIATELY: ?*FEVER GREATER THAN 100.4 F (38 ?C) OR HIGHER ?*CHILLS OR SWEATING ?*NAUSEA AND VOMITING THAT IS NOT CONTROLLED WITH YOUR NAUSEA MEDICATION ?*UNUSUAL SHORTNESS OF BREATH ?*UNUSUAL BRUISING OR BLEEDING ?*URINARY PROBLEMS (pain or burning when urinating, or frequent urination) ?*BOWEL PROBLEMS (unusual diarrhea, constipation, pain near the anus) ?TENDERNESS IN MOUTH AND THROAT WITH OR WITHOUT PRESENCE OF ULCERS (sore throat, sores in mouth, or a toothache) ?UNUSUAL RASH, SWELLING OR PAIN  ?UNUSUAL VAGINAL DISCHARGE OR ITCHING  ? ?Items with * indicate a potential emergency and should be followed up as soon as possible or go to the Emergency Department if any problems should occur. ? ?Please show the CHEMOTHERAPY ALERT CARD or IMMUNOTHERAPY ALERT CARD at check-in to the  Emergency Department and triage nurse. ? ?Should you have questions after your visit or need to cancel or reschedule your appointment, please contact [facility]  [phone] and follow the prompts.  Office hours are 8:00 a.m. to 4:30 p.m. Monday - Friday. Please note that voicemails left after 4:00 p.m. may not be returned until the following business day.  We are closed weekends and major holidays. You have access to a nurse at all times for urgent questions. Please call the main number to the clinic [phone] and follow the prompts. ? ?For any non-urgent questions, you may also contact your provider using MyChart. We now offer e-Visits for anyone 18 and older to request care online for non-urgent symptoms. For details visit [email or web address]. ?  ?Also download the MyChart app! Go to the app store, search "MyChart", open the app, select [facility], and log in with your MyChart username and password. ? ?Due to Covid, a mask is required upon entering the hospital/clinic. If you do not have a mask, one will be given to you upon arrival. For doctor visits, patients may have 1 support person aged 18 or older with them. For treatment visits, patients cannot have anyone with them due to current Covid guidelines and our immunocompromised population.  ? ?Iron Sucrose Injection ?What is this medication? ?IRON SUCROSE (EYE ern SOO krose) treats low levels of iron (iron deficiency anemia) in people with kidney disease. Iron is a mineral that plays an important role in making red blood cells, which carry oxygen from your lungs to the rest of your body. ?This medicine may   be used for other purposes; ask your health care provider or pharmacist if you have questions. ?COMMON BRAND NAME(S): Venofer ?What should I tell my care team before I take this medication? ?They need to know if you have any of these conditions: ?Anemia not caused by low iron levels ?Heart disease ?High levels of  iron in the blood ?Kidney disease ?Liver disease ?An unusual or allergic reaction to iron, other medications, foods, dyes, or preservatives ?Pregnant or trying to get pregnant ?Breast-feeding ?How should I use this medication? ?This medication is for infusion into a vein. It is given in a hospital or clinic setting. ?Talk to your care team about the use of this medication in children. While this medication may be prescribed for children as young as 2 years for selected conditions, precautions do apply. ?Overdosage: If you think you have taken too much of this medicine contact a poison control center or emergency room at once. ?NOTE: This medicine is only for you. Do not share this medicine with others. ?What if I miss a dose? ?It is important not to miss your dose. Call your care team if you are unable to keep an appointment. ?What may interact with this medication? ?Do not take this medication with any of the following: ?Deferoxamine ?Dimercaprol ?Other iron products ?This medication may also interact with the following: ?Chloramphenicol ?Deferasirox ?This list may not describe all possible interactions. Give your health care provider a list of all the medicines, herbs, non-prescription drugs, or dietary supplements you use. Also tell them if you smoke, drink alcohol, or use illegal drugs. Some items may interact with your medicine. ?What should I watch for while using this medication? ?Visit your care team regularly. Tell your care team if your symptoms do not start to get better or if they get worse. You may need blood work done while you are taking this medication. ?You may need to follow a special diet. Talk to your care team. Foods that contain iron include: whole grains/cereals, dried fruits, beans, or peas, leafy green vegetables, and organ meats (liver, kidney). ?What side effects may I notice from receiving this medication? ?Side effects that you should report to your care team as soon as  possible: ?Allergic reactions--skin rash, itching, hives, swelling of the face, lips, tongue, or throat ?Low blood pressure--dizziness, feeling faint or lightheaded, blurry vision ?Shortness of breath ?Side effects that usually do not require medical attention (report to your care team if they continue or are bothersome): ?Flushing ?Headache ?Joint pain ?Muscle pain ?Nausea ?Pain, redness, or irritation at injection site ?This list may not describe all possible side effects. Call your doctor for medical advice about side effects. You may report side effects to FDA at 1-800-FDA-1088. ?Where should I keep my medication? ?This medication is given in a hospital or clinic and will not be stored at home. ?NOTE: This sheet is a summary. It may not cover all possible information. If you have questions about this medicine, talk to your doctor, pharmacist, or health care provider. ?? 2022 Elsevier/Gold Standard (2020-12-12 00:00:00) ? ?

## 2021-11-06 NOTE — Progress Notes (Signed)
Nutrition Follow-up: ? ?Patient with stage III lung cancer.  Patient has completed concurrent chemotherapy and radiation.  Patient on immunotherapy.   ? ?Met with patient during infusion.  Reports that her appetite is good.  Eating variety of foods. Denies any nutrition impact symptoms.  ? ? ?Medications: reviewed ? ?Labs: reviewed ? ?Anthropometrics:  ? ?Weight 141 lb 14.4 oz today ? ?142 lb 6 oz on 12/2 ?143 lb on 11/4 ?142 lb on 10/21 ?145 lb on 9/9 ? ?Patient does not want to gain weight ? ? ?NUTRITION DIAGNOSIS: Inadequate oral intake improved ? ? ? ?INTERVENTION:  ?Continue eating well balanced diet including lean protein sources.   ?Patient has contact information if RD needed  ? ? ?NEXT VISIT: no follow-up ?Patient to reach out if needed ? ?Vertis Bauder B. Zenia Resides, RD, LDN ?Registered Dietitian ?[phone] ? ? ?

## 2021-11-06 NOTE — Progress Notes (Signed)
? ? ? ?Hematology/Oncology Consult note ?[place]  ?[phone] [phone] ? ?Patient Care Team: ?Rusty Aus, MD as PCP - General (Internal Medicine) ?Telford Nab, RN as Sales executive  ? ?Name of the patient: Katherine Perry  ?[id number]  ?Sep 21, 1955  ? ?Date of visit: 11/06/21 ? 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 ?Chief complaint/ Reason for visit-on treatment assessment prior to cycle 16 of maintenance durvalumab ? ?Heme/Onc history: patient is a 66 year old female with a past medical history significant for 1-1/2 pack/day smoking for about 20 years.  She quit smoking about 26 years ago.  Other medical problems include hypertension and hyponatremia.She has been treated for iron of possible pneumonia for the last 1 year.  She has a history of intermittent asthma for which she uses Advair.  She was seen by Dr. And underwent CT chest which showed dense infiltrate/solid mass in the right lower lobe with contiguous airspace opacity in the right upper lobe.  Findings could be secondary to pneumonia but concerning for bronchogenic carcinoma.  Patient then had a PET CT scan which showed consolidation in the medial aspect of the right lower lobe measuring 3.4 x 4 cm with an SUV of 11.6.  Hypermetabolic masslike thickening in the right hilum measuring 2.2 cm with intense hypermetabolic activity.  Groundglass airspace consolidation in the posterior aspect of the right upper lobe with an SUV of 11.4.  The right upper lobe opacity was nonspecific and differentials include pulmonary infection versus lymphangitic spread of carcinoma.  CT chest showed showed masslike area of distortion involving right lower lobe middle lobe as well as right upper lobe.  Generalized right hilar masslike appearance concerning for nodal involvement. ?  ?Patient underwent bronchoscopy and biopsies.Right lower lobe was concerning for adenocarcinoma.  Lymph node station  11 R, 4R, 7 were negative for malignancy.  No malignant cells identified in the right upper lobe.  Case was discussed at tumor board and given the hypermetabolism in the right upper lobe as well as hilum involvement cannot be ruled out despite negative biopsies.  Patient completed concurrent chemoradiation with weekly CarboTaxol in July 2022.  Scan showed partial response.  Maintenance durvalumab started on 02/27/2021 ?  ?  ? ?Interval history-she has another round of bronchitis going on presently along with nasal congestion for which she was prescribed nebulized treatment by Dr. Lanney Gins ? ?ECOG PS- 1 ?Pain scale- 0 ? ? ?Review of systems- Review of Systems  ?Constitutional:  Negative for chills, fever, malaise/fatigue and weight loss.  ?HENT:  Positive for congestion. Negative for ear discharge and nosebleeds.   ?Eyes:  Negative for blurred vision.  ?Respiratory:  Positive for wheezing. Negative for cough, hemoptysis, sputum production and shortness of breath.   ?Cardiovascular:  Negative for chest pain, palpitations, orthopnea and claudication.  ?Gastrointestinal:  Negative for abdominal pain, blood in stool, constipation, diarrhea, heartburn, melena, nausea and vomiting.  ?Genitourinary:  Negative for dysuria, flank pain, frequency, hematuria and urgency.  ?Musculoskeletal:  Negative for back pain, joint pain and myalgias.  ?Skin:  Negative for rash.  ?Neurological:  Negative for dizziness, tingling, focal weakness, seizures, weakness and headaches.  ?Endo/Heme/Allergies:  Does not bruise/bleed easily.  ?Psychiatric/Behavioral:  Negative for depression and suicidal ideas. The patient does not have insomnia.    ? ? ?Allergies  ?Allergen Reactions  ? Ace Inhibitors Cough  ? ? ? ?Past Medical History:  ?Diagnosis Date  ? Anxiety   ? Asthma   ?  Cancer [facility])   ? Cough   ? Dyspnea   ? with exertion  ? GERD (gastroesophageal reflux disease)   ? Hypertension   ? Lung cancer ([place])   ? ? ? ?Past Surgical History:   ?Procedure Laterality Date  ? BUNIONECTOMY Bilateral   ? COLONOSCOPY    ? COLONOSCOPY WITH PROPOFOL N/A 07/30/2021  ? Procedure: COLONOSCOPY WITH PROPOFOL;  Surgeon: Annamaria Helling, DO;  Location: [facility] ENDOSCOPY;  Service: Gastroenterology;  Laterality: N/A;  ? ESOPHAGOGASTRODUODENOSCOPY  04/01/2006  ? PORTA CATH INSERTION N/A 12/22/2020  ? Procedure: PORTA CATH INSERTION;  Surgeon: Algernon Huxley, MD;  Location: [facility] CV LAB;  Service: Cardiovascular;  Laterality: N/A;  ? VIDEO BRONCHOSCOPY WITH ENDOBRONCHIAL NAVIGATION N/A 11/07/2020  ? Procedure: VIDEO BRONCHOSCOPY WITH ENDOBRONCHIAL NAVIGATION;  Surgeon: Ottie Glazier, MD;  Location: [facility] ORS;  Service: Thoracic;  Laterality: N/A;  ? VIDEO BRONCHOSCOPY WITH ENDOBRONCHIAL ULTRASOUND N/A 11/07/2020  ? Procedure: VIDEO BRONCHOSCOPY WITH ENDOBRONCHIAL ULTRASOUND;  Surgeon: Ottie Glazier, MD;  Location: [facility] ORS;  Service: Thoracic;  Laterality: N/A;  ? ? ?Social History  ? ?Socioeconomic History  ? Marital status: Married  ?  Spouse name: Not on file  ? Number of children: Not on file  ? Years of education: Not on file  ? Highest education level: Not on file  ?Occupational History  ? Not on file  ?Tobacco Use  ? Smoking status: Former  ?  Packs/day: 1.50  ?  Years: 26.80  ?  Pack years: 40.20  ?  Types: Cigarettes  ?  Quit date: 11/07/1994  ?  Years since quitting: 27.0  ? Smokeless tobacco: Never  ?Vaping Use  ? Vaping Use: Never used  ?Substance and Sexual Activity  ? Alcohol use: Not Currently  ?  Comment:  wine daily  ? Drug use: Never  ? Sexual activity: Not on file  ?Other Topics Concern  ? Not on file  ?Social History Narrative  ? Not on file  ? ?Social Determinants of Health  ? ?Financial Resource Strain: Not on file  ?Food Insecurity: Not on file  ?Transportation Needs: Not on file  ?Physical Activity: Not on file  ?Stress: Not on file  ?Social Connections: Not on file  ?Intimate Partner Violence: Not on file  ? ? ?Family History  ?Problem  Relation Age of Onset  ? Breast cancer Neg Hx   ? ? ? ?Current Outpatient Medications:  ?  acetaminophen (TYLENOL) 500 MG tablet, Take 500 mg by mouth every 6 (six) hours as needed., Disp: , Rfl:  ?  ADVAIR DISKUS 250-50 MCG/DOSE AEPB, Inhale 1 puff into the lungs as needed., Disp: , Rfl:  ?  albuterol (ACCUNEB) 1.25 MG/3ML nebulizer solution, Inhale into the lungs., Disp: , Rfl:  ?  ALPRAZolam (XANAX) 0.5 MG tablet, Take 0.5 mg by mouth at bedtime as needed for sleep., Disp: , Rfl:  ?  amLODipine (NORVASC) 5 MG tablet, Take 5 mg by mouth at bedtime., Disp: , Rfl:  ?  Cholecalciferol 50 MCG (2000 UT) CAPS, Take 2,000 Units by mouth daily., Disp: , Rfl:  ?  DURVALUMAB IV, Inject into the vein every 14 (fourteen) days. For lung cancer, Disp: , Rfl:  ?  esomeprazole (NEXIUM) 20 MG capsule, Take 20 mg by mouth as needed., Disp: , Rfl:  ?  metoprolol succinate (TOPROL-XL) 50 MG 24 hr tablet, Take 50 mg by mouth every morning., Disp: , Rfl:  ?  montelukast (SINGULAIR) 10 MG tablet, Take 1  tablet by mouth at bedtime., Disp: , Rfl:  ?  zolpidem (AMBIEN) 10 MG tablet, Take 10 mg by mouth at bedtime as needed for sleep., Disp: , Rfl:  ?  azelastine (ASTELIN) 0.1 % nasal spray, Place 2 sprays into both nostrils 2 (two) times daily. Use in each nostril as directed (Patient not taking: Reported on 11/06/2021), Disp: 30 mL, Rfl: 2 ?  budesonide (PULMICORT) 0.25 MG/2ML nebulizer solution, Inhale into the lungs. (Patient not taking: Reported on 11/06/2021), Disp: , Rfl:  ?  docusate sodium (COLACE) 100 MG capsule, Take 100 mg by mouth daily. (Patient not taking: Reported on 07/31/2021), Disp: , Rfl:  ?  fluticasone (FLONASE) 50 MCG/ACT nasal spray, Place 2 sprays into both nostrils daily as needed for rhinitis. (Patient not taking: Reported on 11/06/2021), Disp: , Rfl:  ?No current facility-administered medications for this visit. ? ?Facility-Administered Medications Ordered in Other Visits:  ?  heparin lock flush 100 UNIT/ML injection,  , , ,  ?  heparin lock flush 100 UNIT/ML injection, , , ,  ?  heparin lock flush 100 UNIT/ML injection, , , ,  ?  heparin lock flush 100 UNIT/ML injection, , , ,  ?  sodium chloride flush (NS) 0.9 % injection 1

## 2021-12-04 ENCOUNTER — Inpatient Hospital Stay (HOSPITAL_BASED_OUTPATIENT_CLINIC_OR_DEPARTMENT_OTHER): Payer: BC Managed Care – PPO | Admitting: Oncology

## 2021-12-04 ENCOUNTER — Encounter: Payer: Self-pay | Admitting: Oncology

## 2021-12-04 ENCOUNTER — Inpatient Hospital Stay: Payer: BC Managed Care – PPO | Attending: Oncology

## 2021-12-04 ENCOUNTER — Inpatient Hospital Stay: Payer: BC Managed Care – PPO

## 2021-12-04 VITALS — BP 109/58 | HR 92 | Temp 98.0°F | Resp 16 | Ht 65.0 in | Wt 141.3 lb

## 2021-12-04 DIAGNOSIS — C3431 Malignant neoplasm of lower lobe, right bronchus or lung: Secondary | ICD-10-CM

## 2021-12-04 DIAGNOSIS — Z79899 Other long term (current) drug therapy: Secondary | ICD-10-CM | POA: Diagnosis not present

## 2021-12-04 DIAGNOSIS — Z5112 Encounter for antineoplastic immunotherapy: Secondary | ICD-10-CM

## 2021-12-04 LAB — CBC WITH DIFFERENTIAL/PLATELET
Abs Immature Granulocytes: 0.02 10*3/uL (ref 0.00–0.07)
Basophils Absolute: 0 10*3/uL (ref 0.0–0.1)
Basophils Relative: 1 %
Eosinophils Absolute: 0.5 10*3/uL (ref 0.0–0.5)
Eosinophils Relative: 8 %
HCT: 36.2 % (ref 36.0–46.0)
Hemoglobin: 12.4 g/dL (ref 12.0–15.0)
Immature Granulocytes: 0 %
Lymphocytes Relative: 21 %
Lymphs Abs: 1.3 10*3/uL (ref 0.7–4.0)
MCH: 33.3 pg (ref 26.0–34.0)
MCHC: 34.3 g/dL (ref 30.0–36.0)
MCV: 97.3 fL (ref 80.0–100.0)
Monocytes Absolute: 0.7 10*3/uL (ref 0.1–1.0)
Monocytes Relative: 11 %
Neutro Abs: 3.5 10*3/uL (ref 1.7–7.7)
Neutrophils Relative %: 59 %
Platelets: 302 10*3/uL (ref 150–400)
RBC: 3.72 MIL/uL — ABNORMAL LOW (ref 3.87–5.11)
RDW: 14.5 % (ref 11.5–15.5)
WBC: 5.9 10*3/uL (ref 4.0–10.5)
nRBC: 0 % (ref 0.0–0.2)

## 2021-12-04 LAB — COMPREHENSIVE METABOLIC PANEL
ALT: 19 U/L (ref 0–44)
AST: 26 U/L (ref 15–41)
Albumin: 4.2 g/dL (ref 3.5–5.0)
Alkaline Phosphatase: 115 U/L (ref 38–126)
Anion gap: 8 (ref 5–15)
BUN: 8 mg/dL (ref 8–23)
CO2: 24 mmol/L (ref 22–32)
Calcium: 9.1 mg/dL (ref 8.9–10.3)
Chloride: 97 mmol/L — ABNORMAL LOW (ref 98–111)
Creatinine, Ser: 0.61 mg/dL (ref 0.44–1.00)
GFR, Estimated: >60 mL/min (ref >60)
Glucose, Bld: 113 mg/dL — ABNORMAL HIGH (ref 70–99)
Potassium: 3.8 mmol/L (ref 3.5–5.1)
Sodium: 129 mmol/L — ABNORMAL LOW (ref 135–145)
Total Bilirubin: 0.7 mg/dL (ref 0.3–1.2)
Total Protein: 7.8 g/dL (ref 6.5–8.1)

## 2021-12-04 MED ORDER — SODIUM CHLORIDE 0.9 % IV SOLN
Freq: Once | INTRAVENOUS | Status: AC
  Filled 2021-12-04: qty 250

## 2021-12-04 MED ORDER — SODIUM CHLORIDE 0.9 % IV SOLN
1500.0000 mg | Freq: Once | INTRAVENOUS | Status: AC
  Administered 2021-12-04: 1500 mg via INTRAVENOUS
  Filled 2021-12-04: qty 30

## 2021-12-04 MED ORDER — HEPARIN SOD (PORK) LOCK FLUSH 100 UNIT/ML IV SOLN
INTRAVENOUS | Status: AC
  Administered 2021-12-04: 500 [IU]
  Filled 2021-12-04: qty 5

## 2021-12-04 MED ORDER — HEPARIN SOD (PORK) LOCK FLUSH 100 UNIT/ML IV SOLN
500.0000 [IU] | Freq: Once | INTRAVENOUS | Status: AC | PRN
  Filled 2021-12-04: qty 5

## 2021-12-04 NOTE — Patient Instructions (Signed)
[facility] CANCER CTR AT [place]  Discharge Instructions: ?Thank you for choosing [facility] to provide your oncology and hematology care.  ?If you have a lab appointment with the [facility], please go directly to the [facility] and check in at the registration area. ? ?Wear comfortable clothing and clothing appropriate for easy access to any Portacath or PICC line.  ? ?We strive to give you quality time with your provider. You may need to reschedule your appointment if you arrive late (15 or more minutes).  Arriving late affects you and other patients whose appointments are after yours.  Also, if you miss three or more appointments without notifying the office, you may be dismissed from the clinic at the provider?s discretion.    ?  ?For prescription refill requests, have your pharmacy contact our office and allow 72 hours for refills to be completed.   ? ?Today you received the following chemotherapy and/or immunotherapy agents Imfiniz ?  ?To help prevent nausea and vomiting after your treatment, we encourage you to take your nausea medication as directed. ? ?BELOW ARE SYMPTOMS THAT SHOULD BE REPORTED IMMEDIATELY: ?*FEVER GREATER THAN 100.4 F (38 ?C) OR HIGHER ?*CHILLS OR SWEATING ?*NAUSEA AND VOMITING THAT IS NOT CONTROLLED WITH YOUR NAUSEA MEDICATION ?*UNUSUAL SHORTNESS OF BREATH ?*UNUSUAL BRUISING OR BLEEDING ?*URINARY PROBLEMS (pain or burning when urinating, or frequent urination) ?*BOWEL PROBLEMS (unusual diarrhea, constipation, pain near the anus) ?TENDERNESS IN MOUTH AND THROAT WITH OR WITHOUT PRESENCE OF ULCERS (sore throat, sores in mouth, or a toothache) ?UNUSUAL RASH, SWELLING OR PAIN  ?UNUSUAL VAGINAL DISCHARGE OR ITCHING  ? ?Items with * indicate a potential emergency and should be followed up as soon as possible or go to the Emergency Department if any problems should occur. ? ?Please show the CHEMOTHERAPY ALERT CARD or IMMUNOTHERAPY ALERT CARD at check-in to the  Emergency Department and triage nurse. ? ?Should you have questions after your visit or need to cancel or reschedule your appointment, please contact [facility] CANCER [place] AT [place]  [phone] and follow the prompts.  Office hours are 8:00 a.m. to 4:30 p.m. Monday - Friday. Please note that voicemails left after 4:00 p.m. may not be returned until the following business day.  We are closed weekends and major holidays. You have access to a nurse at all times for urgent questions. Please call the main number to the clinic [phone] and follow the prompts. ? ?For any non-urgent questions, you may also contact your provider using MyChart. We now offer e-Visits for anyone 73 and older to request care online for non-urgent symptoms. For details visit [email or web address]. ?  ?Also download the MyChart app! Go to the app store, search "MyChart", open the app, select [facility], and log in with your MyChart username and password. ? ?Due to Covid, a mask is required upon entering the hospital/clinic. If you do not have a mask, one will be given to you upon arrival. For doctor visits, patients may have 1 support person aged 24 or older with them. For treatment visits, patients cannot have anyone with them due to current Covid guidelines and our immunocompromised population.  ?

## 2021-12-04 NOTE — Progress Notes (Signed)
? ? ? ?Hematology/Oncology Consult note ?[place]  ?Telephone:([phone]) [phone] [phone] ? ?Patient Care Team: ?Rusty Aus, MD as PCP - General (Internal Medicine) ?Telford Nab, RN as Sales executive  ? ?Name of the patient: Katherine Perry  ?[id number]  ?1956-07-23  ? ?Date of visit: 12/04/21 ? 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 ?Chief complaint/ Reason for visit-on treatment assessment prior to cycle 17 of maintenance durvalumab ? ?Heme/Onc history: patient is a 66 year old female with a past medical history significant for 1-1/2 pack/day smoking for about 20 years.  She quit smoking about 26 years ago.  Other medical problems include hypertension and hyponatremia.She has been treated for iron of possible pneumonia for the last 1 year.  She has a history of intermittent asthma for which she uses Advair.  She was seen by Dr. And underwent CT chest which showed dense infiltrate/solid mass in the right lower lobe with contiguous airspace opacity in the right upper lobe.  Findings could be secondary to pneumonia but concerning for bronchogenic carcinoma.  Patient then had a PET CT scan which showed consolidation in the medial aspect of the right lower lobe measuring 3.4 x 4 cm with an SUV of 11.6.  Hypermetabolic masslike thickening in the right hilum measuring 2.2 cm with intense hypermetabolic activity.  Groundglass airspace consolidation in the posterior aspect of the right upper lobe with an SUV of 11.4.  The right upper lobe opacity was nonspecific and differentials include pulmonary infection versus lymphangitic spread of carcinoma.  CT chest showed showed masslike area of distortion involving right lower lobe middle lobe as well as right upper lobe.  Generalized right hilar masslike appearance concerning for nodal involvement. ?  ?Patient underwent bronchoscopy and biopsies.Right lower lobe was concerning for adenocarcinoma.  Lymph node station  11 R, 4R, 7 were negative for malignancy.  No malignant cells identified in the right upper lobe.  Case was discussed at tumor board and given the hypermetabolism in the right upper lobe as well as hilum involvement cannot be ruled out despite negative biopsies.  Patient completed concurrent chemoradiation with weekly CarboTaxol in July 2022.  Scan showed partial response.  Maintenance durvalumab started on 02/27/2021 ?  ?  ? ?Interval history-patient reports improvement in her wheezing and shortness of breath after starting home nebulization.  Denies any new complaints at this time ? ?ECOG PS- 1 ?Pain scale- 0 ? ? ?Review of systems- Review of Systems  ?Constitutional:  Positive for malaise/fatigue. Negative for chills, fever and weight loss.  ?HENT:  Negative for congestion, ear discharge and nosebleeds.   ?Eyes:  Negative for blurred vision.  ?Respiratory:  Negative for cough, hemoptysis, sputum production, shortness of breath and wheezing.   ?Cardiovascular:  Negative for chest pain, palpitations, orthopnea and claudication.  ?Gastrointestinal:  Negative for abdominal pain, blood in stool, constipation, diarrhea, heartburn, melena, nausea and vomiting.  ?Genitourinary:  Negative for dysuria, flank pain, frequency, hematuria and urgency.  ?Musculoskeletal:  Negative for back pain, joint pain and myalgias.  ?Skin:  Negative for rash.  ?Neurological:  Negative for dizziness, tingling, focal weakness, seizures, weakness and headaches.  ?Endo/Heme/Allergies:  Does not bruise/bleed easily.  ?Psychiatric/Behavioral:  Negative for depression and suicidal ideas. The patient does not have insomnia.    ? ? ?Allergies  ?Allergen Reactions  ? Ace Inhibitors Cough  ? ? ? ?Past Medical History:  ?Diagnosis Date  ? Anxiety   ? Asthma   ? Cancer [facility])   ?  Cough   ? Dyspnea   ? with exertion  ? GERD (gastroesophageal reflux disease)   ? Hypertension   ? Lung cancer ([place])   ? ? ? ?Past Surgical History:  ?Procedure Laterality Date   ? BUNIONECTOMY Bilateral   ? COLONOSCOPY    ? COLONOSCOPY WITH PROPOFOL N/A 07/30/2021  ? Procedure: COLONOSCOPY WITH PROPOFOL;  Surgeon: Annamaria Helling, DO;  Location: [facility] ENDOSCOPY;  Service: Gastroenterology;  Laterality: N/A;  ? ESOPHAGOGASTRODUODENOSCOPY  04/01/2006  ? PORTA CATH INSERTION N/A 12/22/2020  ? Procedure: PORTA CATH INSERTION;  Surgeon: Algernon Huxley, MD;  Location: [place] CV LAB;  Service: Cardiovascular;  Laterality: N/A;  ? VIDEO BRONCHOSCOPY WITH ENDOBRONCHIAL NAVIGATION N/A 11/07/2020  ? Procedure: VIDEO BRONCHOSCOPY WITH ENDOBRONCHIAL NAVIGATION;  Surgeon: Ottie Glazier, MD;  Location: [facility] ORS;  Service: Thoracic;  Laterality: N/A;  ? VIDEO BRONCHOSCOPY WITH ENDOBRONCHIAL ULTRASOUND N/A 11/07/2020  ? Procedure: VIDEO BRONCHOSCOPY WITH ENDOBRONCHIAL ULTRASOUND;  Surgeon: Ottie Glazier, MD;  Location: [facility] ORS;  Service: Thoracic;  Laterality: N/A;  ? ? ?Social History  ? ?Socioeconomic History  ? Marital status: Married  ?  Spouse name: Not on file  ? Number of children: Not on file  ? Years of education: Not on file  ? Highest education level: Not on file  ?Occupational History  ? Not on file  ?Tobacco Use  ? Smoking status: Former  ?  Packs/day: 1.50  ?  Years: 26.80  ?  Pack years: 40.20  ?  Types: Cigarettes  ?  Quit date: 11/07/1994  ?  Years since quitting: 27.0  ? Smokeless tobacco: Never  ?Vaping Use  ? Vaping Use: Never used  ?Substance and Sexual Activity  ? Alcohol use: Yes  ?  Comment:  wine daily  ? Drug use: Never  ? Sexual activity: Yes  ?Other Topics Concern  ? Not on file  ?Social History Narrative  ? Not on file  ? ?Social Determinants of Health  ? ?Financial Resource Strain: Not on file  ?Food Insecurity: Not on file  ?Transportation Needs: Not on file  ?Physical Activity: Not on file  ?Stress: Not on file  ?Social Connections: Not on file  ?Intimate Partner Violence: Not on file  ? ? ?Family History  ?Problem Relation Age of Onset  ? Breast cancer Neg Hx    ? ? ? ?Current Outpatient Medications:  ?  acetaminophen (TYLENOL) 500 MG tablet, Take 500 mg by mouth every 6 (six) hours as needed., Disp: , Rfl:  ?  ADVAIR DISKUS 250-50 MCG/DOSE AEPB, Inhale 1 puff into the lungs as needed., Disp: , Rfl:  ?  albuterol (ACCUNEB) 1.25 MG/3ML nebulizer solution, Take 1 ampule by nebulization in the morning and at bedtime., Disp: , Rfl:  ?  ALPRAZolam (XANAX) 0.5 MG tablet, Take 0.5 mg by mouth at bedtime as needed for sleep., Disp: , Rfl:  ?  amLODipine (NORVASC) 5 MG tablet, Take 5 mg by mouth at bedtime., Disp: , Rfl:  ?  budesonide (PULMICORT) 0.25 MG/2ML nebulizer solution, Inhale into the lungs., Disp: , Rfl:  ?  Cholecalciferol 50 MCG (2000 UT) CAPS, Take 2,000 Units by mouth daily., Disp: , Rfl:  ?  DURVALUMAB IV, Inject into the vein every 14 (fourteen) days. For lung cancer, Disp: , Rfl:  ?  esomeprazole (NEXIUM) 20 MG capsule, Take 20 mg by mouth as needed., Disp: , Rfl:  ?  fluticasone (FLONASE) 50 MCG/ACT nasal spray, Place 2 sprays into both nostrils daily  as needed for rhinitis., Disp: , Rfl:  ?  metoprolol succinate (TOPROL-XL) 50 MG 24 hr tablet, Take 50 mg by mouth every morning., Disp: , Rfl:  ?  montelukast (SINGULAIR) 10 MG tablet, Take 1 tablet by mouth at bedtime., Disp: , Rfl:  ?  zolpidem (AMBIEN) 10 MG tablet, Take 10 mg by mouth at bedtime as needed for sleep., Disp: , Rfl:  ?  azelastine (ASTELIN) 0.1 % nasal spray, Place 2 sprays into both nostrils 2 (two) times daily. Use in each nostril as directed (Patient not taking: Reported on 11/06/2021), Disp: 30 mL, Rfl: 2 ?  docusate sodium (COLACE) 100 MG capsule, Take 100 mg by mouth daily. (Patient not taking: Reported on 07/31/2021), Disp: , Rfl:  ?No current facility-administered medications for this visit. ? ?Facility-Administered Medications Ordered in Other Visits:  ?  heparin lock flush 100 UNIT/ML injection, , , ,  ?  heparin lock flush 100 UNIT/ML injection, , , ,  ?  heparin lock flush 100 UNIT/ML  injection, , , ,  ?  heparin lock flush 100 UNIT/ML injection, , , ,  ?  sodium chloride flush (NS) 0.9 % injection 10 mL, 10 mL, Intravenous, Once, Sindy Guadeloupe, MD ?  sodium chloride flush (NS) 0.9 %

## 2022-01-01 ENCOUNTER — Encounter: Payer: Self-pay | Admitting: Nurse Practitioner

## 2022-01-01 ENCOUNTER — Inpatient Hospital Stay: Payer: BC Managed Care – PPO | Attending: Oncology

## 2022-01-01 ENCOUNTER — Inpatient Hospital Stay: Payer: BC Managed Care – PPO

## 2022-01-01 ENCOUNTER — Inpatient Hospital Stay (HOSPITAL_BASED_OUTPATIENT_CLINIC_OR_DEPARTMENT_OTHER): Payer: BC Managed Care – PPO | Admitting: Nurse Practitioner

## 2022-01-01 VITALS — BP 135/55 | HR 89 | Temp 97.2°F | Resp 16 | Ht 65.0 in | Wt 141.8 lb

## 2022-01-01 DIAGNOSIS — Z5112 Encounter for antineoplastic immunotherapy: Secondary | ICD-10-CM | POA: Diagnosis present

## 2022-01-01 DIAGNOSIS — Z87891 Personal history of nicotine dependence: Secondary | ICD-10-CM | POA: Diagnosis not present

## 2022-01-01 DIAGNOSIS — C3431 Malignant neoplasm of lower lobe, right bronchus or lung: Secondary | ICD-10-CM | POA: Insufficient documentation

## 2022-01-01 DIAGNOSIS — Z79899 Other long term (current) drug therapy: Secondary | ICD-10-CM | POA: Diagnosis not present

## 2022-01-01 LAB — COMPREHENSIVE METABOLIC PANEL
ALT: 19 U/L (ref 0–44)
AST: 27 U/L (ref 15–41)
Albumin: 3.9 g/dL (ref 3.5–5.0)
Alkaline Phosphatase: 129 U/L — ABNORMAL HIGH (ref 38–126)
Anion gap: 9 (ref 5–15)
BUN: 9 mg/dL (ref 8–23)
CO2: 25 mmol/L (ref 22–32)
Calcium: 9.3 mg/dL (ref 8.9–10.3)
Chloride: 98 mmol/L (ref 98–111)
Creatinine, Ser: 0.64 mg/dL (ref 0.44–1.00)
GFR, Estimated: >60 mL/min (ref >60)
Glucose, Bld: 184 mg/dL — ABNORMAL HIGH (ref 70–99)
Potassium: 3.7 mmol/L (ref 3.5–5.1)
Sodium: 132 mmol/L — ABNORMAL LOW (ref 135–145)
Total Bilirubin: 0.5 mg/dL (ref 0.3–1.2)
Total Protein: 8 g/dL (ref 6.5–8.1)

## 2022-01-01 LAB — CBC WITH DIFFERENTIAL/PLATELET
Abs Immature Granulocytes: 0.02 10*3/uL (ref 0.00–0.07)
Basophils Absolute: 0 10*3/uL (ref 0.0–0.1)
Basophils Relative: 1 %
Eosinophils Absolute: 0.2 10*3/uL (ref 0.0–0.5)
Eosinophils Relative: 4 %
HCT: 35.5 % — ABNORMAL LOW (ref 36.0–46.0)
Hemoglobin: 12.2 g/dL (ref 12.0–15.0)
Immature Granulocytes: 0 %
Lymphocytes Relative: 22 %
Lymphs Abs: 1.1 10*3/uL (ref 0.7–4.0)
MCH: 33.9 pg (ref 26.0–34.0)
MCHC: 34.4 g/dL (ref 30.0–36.0)
MCV: 98.6 fL (ref 80.0–100.0)
Monocytes Absolute: 0.5 10*3/uL (ref 0.1–1.0)
Monocytes Relative: 11 %
Neutro Abs: 3 10*3/uL (ref 1.7–7.7)
Neutrophils Relative %: 62 %
Platelets: 290 10*3/uL (ref 150–400)
RBC: 3.6 MIL/uL — ABNORMAL LOW (ref 3.87–5.11)
RDW: 13.8 % (ref 11.5–15.5)
WBC: 4.8 10*3/uL (ref 4.0–10.5)
nRBC: 0 % (ref 0.0–0.2)

## 2022-01-01 LAB — TSH: TSH: 1.131 u[IU]/mL (ref 0.350–4.500)

## 2022-01-01 MED ORDER — SODIUM CHLORIDE 0.9 % IV SOLN
1500.0000 mg | Freq: Once | INTRAVENOUS | Status: AC
  Administered 2022-01-01: 1500 mg via INTRAVENOUS
  Filled 2022-01-01: qty 30

## 2022-01-01 MED ORDER — HEPARIN SOD (PORK) LOCK FLUSH 100 UNIT/ML IV SOLN
500.0000 [IU] | Freq: Once | INTRAVENOUS | Status: AC | PRN
  Administered 2022-01-01: 500 [IU]
  Filled 2022-01-01: qty 5

## 2022-01-01 MED ORDER — SODIUM CHLORIDE 0.9 % IV SOLN
Freq: Once | INTRAVENOUS | Status: AC
  Filled 2022-01-01: qty 250

## 2022-01-01 NOTE — Progress Notes (Signed)
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  She quit smoking about 26 years ago.  Other medical problems include hypertension and hyponatremia.She has been treated for iron of possible pneumonia for the last 1 year.  She has a history of intermittent asthma for which she uses Advair.  She was seen by Dr. And underwent CT chest which showed dense infiltrate/solid mass in the right lower lobe with contiguous airspace opacity in the right upper lobe.  Findings could be secondary to pneumonia but concerning for bronchogenic carcinoma.  Patient then had a PET CT scan which showed consolidation in the medial aspect of the right lower lobe measuring 3.4 x 4 cm with an SUV of 11.6.  Hypermetabolic masslike thickening in the right hilum measuring 2.2 cm with intense hypermetabolic activity.  Groundglass airspace consolidation in the posterior aspect of the right upper lobe with an SUV of 11.4.  The right upper lobe opacity was nonspecific and differentials include pulmonary infection versus lymphangitic spread of carcinoma.  CT chest showed showed masslike area of distortion involving right lower lobe middle lobe as well as right upper lobe.  Generalized right hilar masslike appearance concerning for nodal involvement.   Patient underwent bronchoscopy and biopsies.Right lower lobe was concerning for adenocarcinoma.  Lymph node station 11 R,  4R, 7 were negative for malignancy.  No malignant cells identified in the right upper lobe.  Case was discussed at tumor board and given the hypermetabolism in the right upper lobe as well as hilum involvement cannot be ruled out despite negative biopsies.  Patient completed concurrent chemoradiation with weekly CarboTaxol in July 2022.  Scan showed partial response.  Maintenance durvalumab started on 02/27/2021      Interval history- Patient returns to clinic for evaluation and consideration of durvalumab. Feels well. Had recent rash d/t bug bites from her work at [employer]. Now resolved. No new c  ECOG PS- 0 Pain scale- 0   Review of systems- Review of Systems  Constitutional:  Negative for chills, fever, malaise/fatigue and weight loss.  HENT:  Negative for congestion, ear discharge and nosebleeds.   Eyes:  Negative for blurred vision.  Respiratory:  Negative for cough, hemoptysis, sputum production, shortness of breath and wheezing.   Cardiovascular:  Negative for chest pain, palpitations, orthopnea and claudication.  Gastrointestinal:  Negative for abdominal pain, blood in stool, constipation, diarrhea, heartburn, melena, nausea and vomiting.  Genitourinary:  Negative for dysuria, flank pain, frequency, hematuria and urgency.  Musculoskeletal:  Negative for back pain, joint pain and myalgias.  Skin:  Negative for rash.  Neurological:  Negative for dizziness, tingling, focal weakness, seizures, weakness and headaches.  Endo/Heme/Allergies:  Does not bruise/bleed easily.  Psychiatric/Behavioral:  Negative for depression and suicidal ideas. The patient does not have insomnia.      Allergies  Allergen Reactions   Ace Inhibitors Cough     Past Medical History:  Diagnosis Date   Anxiety    Asthma  Cancer (HCC)    Cough    Dyspnea    with exertion   GERD (gastroesophageal reflux disease)    Hypertension    Lung cancer [facility])      Past Surgical History:  Procedure  Laterality Date   BUNIONECTOMY Bilateral    COLONOSCOPY     COLONOSCOPY WITH PROPOFOL N/A 07/30/2021   Procedure: COLONOSCOPY WITH PROPOFOL;  Surgeon: Annamaria Helling, DO;  Location[facility] ENDOSCOPY;  Service: Gastroenterology;  Laterality: N/A;   ESOPHAGOGASTRODUODENOSCOPY  04/01/2006   PORTA CATH INSERTION N/A 12/22/2020   Procedure: PORTA CATH INSERTION;  Surgeon: Algernon Huxley, MD;  Location: [facility] CV LAB;  Service: Cardiovascular;  Laterality: N/A;   VIDEO BRONCHOSCOPY WITH ENDOBRONCHIAL NAVIGATION N/A 11/07/2020   Procedure: VIDEO BRONCHOSCOPY WITH ENDOBRONCHIAL NAVIGATION;  Surgeon: Ottie Glazier, MD;  Location: [facility] ORS;  Service: Thoracic;  Laterality: N/A;   VIDEO BRONCHOSCOPY WITH ENDOBRONCHIAL ULTRASOUND N/A 11/07/2020   Procedure: VIDEO BRONCHOSCOPY WITH ENDOBRONCHIAL ULTRASOUND;  Surgeon: Ottie Glazier, MD;  Location: [facility] ORS;  Service: Thoracic;  Laterality: N/A;    Social History   Socioeconomic History   Marital status: Married    Spouse name: Not on file   Number of children: Not on file   Years of education: Not on file   Highest education level: Not on file  Occupational History   Not on file  Tobacco Use   Smoking status: Former    Packs/day: 1.50    Years: 26.80    Pack years: 40.20    Types: Cigarettes    Quit date: 11/07/1994    Years since quitting: 27.1   Smokeless tobacco: Never  Vaping Use   Vaping Use: Never used  Substance and Sexual Activity   Alcohol use: Yes    Comment:  wine daily   Drug use: Never   Sexual activity: Yes  Other Topics Concern   Not on file  Social History Narrative   Not on file   Social Determinants of Health   Financial Resource Strain: Not on file  Food Insecurity: Not on file  Transportation Needs: Not on file  Physical Activity: Not on file  Stress: Not on file  Social Connections: Not on file  Intimate Partner Violence: Not on file    Family History  Problem Relation Age of Onset   Breast  cancer Neg Hx      Current Outpatient Medications:    acetaminophen (TYLENOL) 500 MG tablet, Take 500 mg by mouth every 6 (six) hours as needed., Disp: , Rfl:    ADVAIR DISKUS 250-50 MCG/DOSE AEPB, Inhale 1 puff into the lungs as needed., Disp: , Rfl:    albuterol (ACCUNEB) 1.25 MG/3ML nebulizer solution, Take 1 ampule by nebulization in the morning and at bedtime., Disp: , Rfl:    ALPRAZolam (XANAX) 0.5 MG tablet, Take 0.5 mg by mouth at bedtime as needed for sleep., Disp: , Rfl:    amLODipine (NORVASC) 5 MG tablet, Take 5 mg by mouth at bedtime., Disp: , Rfl:    Cholecalciferol 50 MCG (2000 UT) CAPS, Take 2,000 Units by mouth daily., Disp: , Rfl:    DURVALUMAB IV, Inject into the vein every 14 (fourteen) days. For lung cancer, Disp: , Rfl:    esomeprazole (NEXIUM) 20 MG capsule, Take 20 mg by mouth as needed., Disp: , Rfl:    fluticasone (FLONASE) 50 MCG/ACT nasal spray, Place 2 sprays into both nostrils daily as needed for rhinitis., Disp: , Rfl:    metoprolol  succinate (TOPROL-XL) 50 MG 24 hr tablet, Take 50 mg by mouth every morning., Disp: , Rfl:    montelukast (SINGULAIR) 10 MG tablet, Take 1 tablet by mouth at bedtime., Disp: , Rfl:    zolpidem (AMBIEN) 10 MG tablet, Take 10 mg by mouth at bedtime as needed for sleep., Disp: , Rfl:    azelastine (ASTELIN) 0.1 % nasal spray, Place 2 sprays into both nostrils 2 (two) times daily. Use in each nostril as directed (Patient not taking: Reported on 11/06/2021), Disp: 30 mL, Rfl: 2   budesonide (PULMICORT) 0.25 MG/2ML nebulizer solution, Inhale into the lungs. (Patient not taking: Reported on 01/01/2022), Disp: , Rfl:    docusate sodium (COLACE) 100 MG capsule, Take 100 mg by mouth daily. (Patient not taking: Reported on 07/31/2021), Disp: , Rfl:  No current facility-administered medications for this visit.  Facility-Administered Medications Ordered in Other Visits:    heparin lock flush 100 UNIT/ML injection, , , ,    heparin lock flush 100  UNIT/ML injection, , , ,    heparin lock flush 100 UNIT/ML injection, , , ,    heparin lock flush 100 UNIT/ML injection, , , ,    sodium chloride flush (NS) 0.9 % injection 10 mL, 10 mL, Intravenous, Once, Sindy Guadeloupe, MD   sodium chloride flush (NS) 0.9 % injection 10 mL, 10 mL, Intravenous, PRN, Sindy Guadeloupe, MD, 10 mL at 01/26/21 0807  Physical exam:  Vitals:   01/01/22 1330  BP: (!) 135/55  Pulse: 89  Resp: 16  Temp: (!) 97.2 F (36.2 C)  TempSrc: Tympanic  SpO2: 100%  Weight: 141 lb 12.8 oz (64.3 kg)  Height: _0  (1.651 m)   Physical Exam Constitutional:      General: She is not in acute distress. Cardiovascular:     Rate and Rhythm: Normal rate and regular rhythm.     Heart sounds: Normal heart sounds.  Pulmonary:     Effort: Pulmonary effort is normal.     Breath sounds: Normal breath sounds.  Abdominal:     General: Bowel sounds are normal.     Palpations: Abdomen is soft.  Skin:    General: Skin is warm and dry.  Neurological:     Mental Status: She is alert and oriented to person, place, and time.        Latest Ref Rng & Units 01/01/2022    1:16 PM  CMP  Glucose 70 - 99 mg/dL 184    BUN 8 - 23 mg/dL 9    Creatinine 0.44 - 1.00 mg/dL 0.64    Sodium 135 - 145 mmol/L 132    Potassium 3.5 - 5.1 mmol/L 3.7    Chloride 98 - 111 mmol/L 98    CO2 22 - 32 mmol/L 25    Calcium 8.9 - 10.3 mg/dL 9.3    Total Protein 6.5 - 8.1 g/dL 8.0    Total Bilirubin 0.3 - 1.2 mg/dL 0.5    Alkaline Phos 38 - 126 U/L 129    AST 15 - 41 U/L 27    ALT 0 - 44 U/L 19        Latest Ref Rng & Units 01/01/2022    1:16 PM  CBC  WBC 4.0 - 10.5 K/uL 4.8    Hemoglobin 12.0 - 15.0 g/dL 12.2    Hematocrit 36.0 - 46.0 % 35.5    Platelets 150 - 400 K/uL 290       Assessment and plan-  Patient is a 66 y.o. 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  She is here for on treatment assessment prior to cycle 18 of maintenance durvalumab. Tolerating well.    Counts okay to proceed with cycle 18 of maintenance durvalumab today.  She will be due for 2 more maintenance doses. She will see Dr. Janese Banks back in 4 weeks for cycle 19.  Plan for repeat CT chest abdomen and pelvis with contrast scheduled for June 9th.   Rash- d/t bug bites. Now resolved. Not related to treatment.   Elevated alk phos- monitor  Elevated blood sugar- high carbohydrate snack just prior to lab draw. No hx of diabetes.   Disposition: Durvalumab today 4 weeks- port/lab (cbc, cmp, tsh), Dr. Janese Banks, +/- durvalumab- la   Visit Diagnosis 1. Encounter for antineoplastic immunotherapy   2. Malignant neoplasm of lower lobe of right lung ([place])    Beckey Rutter, [place], AGNP-C [place] at [facility] [phone] (clinic) 01/01/2022

## 2022-01-01 NOTE — Progress Notes (Signed)
Patient tolerated durvalumab infusion well, no questions/concerns voiced. Patient stable at discharge. Refused AVS .

## 2022-01-01 NOTE — Patient Instructions (Signed)
[facility]CER [facility] AT [place]  Discharge Instructions: Thank you for choosing [facility] to provide your oncology and hematology care.  If you have a lab appointment with the [facility], please go directly to the [facility] and check in at the registration area.  Wear comfortable clothing and clothing appropriate for easy access to any Portacath or PICC line.   We strive to give you quality time with your provider. You may need to reschedule your appointment if you arrive late (15 or more minutes).  Arriving late affects you and other patients whose appointments are after yours.  Also, if you miss three or more appointments without notifying the office, you may be dismissed from the clinic at the provider's discretion.      For prescription refill requests, have your pharmacy contact our office and allow 72 hours for refills to be completed.    Today you received the following chemotherapy and/or immunotherapy agents: durvalumab To help prevent nausea and vomiting after your treatment, we encourage you to take your nausea medication as directed.  BELOW ARE SYMPTOMS THAT SHOULD BE REPORTED IMMEDIATELY: *FEVER GREATER THAN 100.4 F (38 C) OR HIGHER *CHILLS OR SWEATING *NAUSEA AND VOMITING THAT IS NOT CONTROLLED WITH YOUR NAUSEA MEDICATION *UNUSUAL SHORTNESS OF BREATH *UNUSUAL BRUISING OR BLEEDING *URINARY PROBLEMS (pain or burning when urinating, or frequent urination) *BOWEL PROBLEMS (unusual diarrhea, constipation, pain near the anus) TENDERNESS IN MOUTH AND THROAT WITH OR WITHOUT PRESENCE OF ULCERS (sore throat, sores in mouth, or a toothache) UNUSUAL RASH, SWELLING OR PAIN  UNUSUAL VAGINAL DISCHARGE OR ITCHING   Items with * indicate a potential emergency and should be followed up as soon as possible or go to the Emergency Department if any problems should occur.  Please show the CHEMOTHERAPY ALERT CARD or IMMUNOTHERAPY ALERT CARD at check-in to the  Emergency Department and triage nurse.  Should you have questions after your visit or need to cancel or reschedule your appointment, please contact [facility] [place] AT [place]  [phone] and follow the prompts.  Office hours are 8:00 a.m. to 4:30 p.m. Monday - Friday. Please note that voicemails left after 4:00 p.m. may not be returned until the following business day.  We are closed weekends and major holidays. You have access to a nurse at all times for urgent questions. Please call the main number to the clinic [phone] and follow the prompts.  For any non-urgent questions, you may also contact your provider using MyChart. We now offer e-Visits for anyone 77 and older to request care online for non-urgent symptoms. For details visit [email or web address].   Also download the MyChart app! Go to the app store, search "MyChart", open the app, select [facility], and log in with your MyChart username and password.  Due to Covid, a mask is required upon entering the hospital/clinic. If you do not have a mask, one will be given to you upon arrival. For doctor visits, patients may have 1 support person aged 67 or older with them. For treatment visits, patients cannot have anyone with them due to current Covid guidelines and our immunocompromised population.

## 2022-01-08 ENCOUNTER — Ambulatory Visit
Admission: RE | Admit: 2022-01-08 | Discharge: 2022-01-08 | Disposition: A | Discharge status: Home / Self Care | Payer: BC Managed Care – PPO | Source: Ambulatory Visit | Attending: Oncology | Admitting: Oncology

## 2022-01-08 DIAGNOSIS — C3431 Malignant neoplasm of lower lobe, right bronchus or lung: Secondary | ICD-10-CM | POA: Diagnosis present

## 2022-01-08 MED ORDER — IOHEXOL 300 MG/ML  SOLN
80.0000 mL | Freq: Once | INTRAMUSCULAR | Status: AC | PRN
  Administered 2022-01-08: 80 mL via INTRAVENOUS

## 2022-01-11 ENCOUNTER — Other Ambulatory Visit: Payer: Self-pay | Admitting: Oncology

## 2022-01-11 NOTE — Progress Notes (Signed)
tumor °

## 2022-01-14 ENCOUNTER — Other Ambulatory Visit: Payer: BC Managed Care – PPO

## 2022-01-14 NOTE — Progress Notes (Cosign Needed Addendum)
Tumor Board Documentation  Katherine Perry was presented by Dr Janese Banks at our Tumor Board on 01/14/2022, which included representatives from medical oncology, pharmacy, pulmonology, radiology, genetics, pathology, research, radiation oncology, navigation, internal medicine, palliative care.  Katherine Perry currently presents as a current patient, for discussion with history of the following treatments: active survellience, neoadjuvant chemoradiation, immunotherapy.  Additionally, we reviewed previous medical and familial history, history of present illness, and recent lab results along with all available histopathologic and imaging studies. The tumor board considered available treatment options and made the following recommendations: Possible Additional screening (PET Scan) Thoracentesis  The following procedures/referrals were also placed: No orders of the defined types were placed in this encounter.   Clinical Trial Status: not discussed   Staging used: AJCC Stage Group AJCC Staging: T: 3 N: 1   Group: Stage III canrcinoma of LLL Lung  National site-specific guidelines NCCN were discussed with respect to the case.  Tumor board is a meeting of clinicians from various specialty areas who evaluate and discuss patients for whom a multidisciplinary approach is being considered. Final determinations in the plan of care are those of the provider(s). The responsibility for follow up of recommendations given during tumor board is that of the provider.   Today's extended care, comprehensive team conference, Ty[place]anna was not present for the discussion and was not examined.   Multidisciplinary Tumor Board is a multidisciplinary case peer review process.  Decisions discussed in the Multidisciplinary Tumor Board reflect the opinions of the specialists present at the conference without having examined the patient.  Ultimately, treatment and diagnostic decisions rest with the primary provider(s) and the patient.

## 2022-01-18 ENCOUNTER — Telehealth: Payer: Self-pay | Admitting: *Deleted

## 2022-01-18 ENCOUNTER — Encounter: Payer: Self-pay | Admitting: *Deleted

## 2022-01-18 DIAGNOSIS — C3491 Malignant neoplasm of unspecified part of right bronchus or lung: Secondary | ICD-10-CM

## 2022-01-18 DIAGNOSIS — J90 Pleural effusion, not elsewhere classified: Secondary | ICD-10-CM

## 2022-01-18 NOTE — Telephone Encounter (Signed)
Per Dr. Janese Banks, pt needs thoracentesis scheduled for right pleural effusion. Will need to add on testing for cytology, gram stain, ldh, and protein. Checklist completed and faxed to radiology for scheduling. Pt will be notified with appt once scheduled.

## 2022-01-19 ENCOUNTER — Other Ambulatory Visit: Payer: Self-pay | Admitting: Radiology

## 2022-01-19 ENCOUNTER — Ambulatory Visit
Admission: RE | Admit: 2022-01-19 | Discharge: 2022-01-19 | Disposition: A | Discharge status: Home / Self Care | Payer: BC Managed Care – PPO | Source: Ambulatory Visit | Attending: Radiology | Admitting: Radiology

## 2022-01-19 ENCOUNTER — Ambulatory Visit
Admission: RE | Admit: 2022-01-19 | Discharge: 2022-01-19 | Disposition: A | Discharge status: Home / Self Care | Payer: BC Managed Care – PPO | Source: Ambulatory Visit | Attending: Oncology | Admitting: Oncology

## 2022-01-19 DIAGNOSIS — Z9889 Other specified postprocedural states: Secondary | ICD-10-CM

## 2022-01-19 DIAGNOSIS — J90 Pleural effusion, not elsewhere classified: Secondary | ICD-10-CM | POA: Diagnosis not present

## 2022-01-19 DIAGNOSIS — C3491 Malignant neoplasm of unspecified part of right bronchus or lung: Secondary | ICD-10-CM

## 2022-01-19 LAB — LACTATE DEHYDROGENASE, PLEURAL OR PERITONEAL FLUID: LD, Fluid: 80 U/L — ABNORMAL HIGH (ref 3–23)

## 2022-01-19 LAB — PROTEIN, PLEURAL OR PERITONEAL FLUID: Total protein, fluid: 4.7 g/dL

## 2022-01-19 NOTE — Procedures (Signed)
PROCEDURE SUMMARY:  Successful US guided right thoracentesis. Yielded 650 mL of clear yellow fluid. Pt tolerated procedure well. No immediate complications.  Specimen was sent for labs. CXR ordered.  EBL < 1 mL  Tsosie Billing D PA-C 01/19/2022 2:50 PM

## 2022-01-22 LAB — CYTOLOGY - NON PAP

## 2022-01-23 LAB — BODY FLUID CULTURE W GRAM STAIN
Culture: NO GROWTH
Gram Stain: NONE SEEN

## 2022-01-29 ENCOUNTER — Encounter: Payer: Self-pay | Admitting: Oncology

## 2022-01-29 ENCOUNTER — Inpatient Hospital Stay: Payer: BC Managed Care – PPO

## 2022-01-29 ENCOUNTER — Inpatient Hospital Stay (HOSPITAL_BASED_OUTPATIENT_CLINIC_OR_DEPARTMENT_OTHER): Payer: BC Managed Care – PPO | Admitting: Oncology

## 2022-01-29 VITALS — BP 156/64 | HR 84 | Temp 97.5°F | Resp 16 | Wt 141.5 lb

## 2022-01-29 VITALS — BP 154/71 | HR 72 | Resp 16

## 2022-01-29 DIAGNOSIS — C3491 Malignant neoplasm of unspecified part of right bronchus or lung: Secondary | ICD-10-CM

## 2022-01-29 DIAGNOSIS — Z5112 Encounter for antineoplastic immunotherapy: Secondary | ICD-10-CM

## 2022-01-29 DIAGNOSIS — C3431 Malignant neoplasm of lower lobe, right bronchus or lung: Secondary | ICD-10-CM

## 2022-01-29 DIAGNOSIS — Z95828 Presence of other vascular implants and grafts: Secondary | ICD-10-CM

## 2022-01-29 LAB — CBC WITH DIFFERENTIAL/PLATELET
Abs Immature Granulocytes: 0.01 10*3/uL (ref 0.00–0.07)
Basophils Absolute: 0 10*3/uL (ref 0.0–0.1)
Basophils Relative: 1 %
Eosinophils Absolute: 0.1 10*3/uL (ref 0.0–0.5)
Eosinophils Relative: 2 %
HCT: 34.7 % — ABNORMAL LOW (ref 36.0–46.0)
Hemoglobin: 12 g/dL (ref 12.0–15.0)
Immature Granulocytes: 0 %
Lymphocytes Relative: 21 %
Lymphs Abs: 1 10*3/uL (ref 0.7–4.0)
MCH: 34.5 pg — ABNORMAL HIGH (ref 26.0–34.0)
MCHC: 34.6 g/dL (ref 30.0–36.0)
MCV: 99.7 fL (ref 80.0–100.0)
Monocytes Absolute: 0.5 10*3/uL (ref 0.1–1.0)
Monocytes Relative: 11 %
Neutro Abs: 3 10*3/uL (ref 1.7–7.7)
Neutrophils Relative %: 65 %
Platelets: 257 10*3/uL (ref 150–400)
RBC: 3.48 MIL/uL — ABNORMAL LOW (ref 3.87–5.11)
RDW: 12.8 % (ref 11.5–15.5)
WBC: 4.6 10*3/uL (ref 4.0–10.5)
nRBC: 0 % (ref 0.0–0.2)

## 2022-01-29 LAB — COMPREHENSIVE METABOLIC PANEL
ALT: 21 U/L (ref 0–44)
AST: 25 U/L (ref 15–41)
Albumin: 4.2 g/dL (ref 3.5–5.0)
Alkaline Phosphatase: 125 U/L (ref 38–126)
Anion gap: 9 (ref 5–15)
BUN: 7 mg/dL — ABNORMAL LOW (ref 8–23)
CO2: 24 mmol/L (ref 22–32)
Calcium: 9.3 mg/dL (ref 8.9–10.3)
Chloride: 96 mmol/L — ABNORMAL LOW (ref 98–111)
Creatinine, Ser: 0.44 mg/dL (ref 0.44–1.00)
GFR, Estimated: >60 mL/min (ref >60)
Glucose, Bld: 101 mg/dL — ABNORMAL HIGH (ref 70–99)
Potassium: 3.9 mmol/L (ref 3.5–5.1)
Sodium: 129 mmol/L — ABNORMAL LOW (ref 135–145)
Total Bilirubin: 0.8 mg/dL (ref 0.3–1.2)
Total Protein: 7.7 g/dL (ref 6.5–8.1)

## 2022-01-29 LAB — TSH: TSH: 1.329 u[IU]/mL (ref 0.350–4.500)

## 2022-01-29 MED ORDER — SODIUM CHLORIDE 0.9% FLUSH
10.0000 mL | INTRAVENOUS | Status: DC | PRN
  Administered 2022-01-29: 10 mL
  Filled 2022-01-29: qty 10

## 2022-01-29 MED ORDER — SODIUM CHLORIDE 0.9 % IV SOLN
1500.0000 mg | Freq: Once | INTRAVENOUS | Status: AC
  Administered 2022-01-29: 1500 mg via INTRAVENOUS
  Filled 2022-01-29: qty 30

## 2022-01-29 MED ORDER — SODIUM CHLORIDE 0.9 % IV SOLN
Freq: Once | INTRAVENOUS | Status: AC
  Filled 2022-01-29: qty 250

## 2022-01-29 MED ORDER — HEPARIN SOD (PORK) LOCK FLUSH 100 UNIT/ML IV SOLN
500.0000 [IU] | Freq: Once | INTRAVENOUS | Status: AC | PRN
  Administered 2022-01-29: 500 [IU]
  Filled 2022-01-29: qty 5

## 2022-01-29 NOTE — Progress Notes (Signed)
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  She quit smoking about 26 years ago.  Other medical problems include hypertension and hyponatremia.She has been treated for iron of possible pneumonia for the last 1 year.  She has a history of intermittent asthma for which she uses Advair.  She was seen by Dr. And underwent CT chest which showed dense infiltrate/solid mass in the right lower lobe with contiguous airspace opacity in the right upper lobe.  Findings could be secondary to pneumonia but concerning for bronchogenic carcinoma.  Patient then had a PET CT scan which showed consolidation in the medial aspect of the right lower lobe measuring 3.4 x 4 cm with an SUV of 11.6.  Hypermetabolic masslike thickening in the right hilum measuring 2.2 cm with intense hypermetabolic activity.  Groundglass airspace consolidation in the posterior aspect of the right upper lobe with an SUV of 11.4.  The right upper lobe opacity was nonspecific and differentials include pulmonary infection versus lymphangitic spread of carcinoma.  CT chest showed showed masslike area of distortion involving right lower lobe middle lobe as well as right upper lobe.  Generalized right hilar masslike appearance concerning for nodal involvement.   Patient underwent bronchoscopy and biopsies.Right lower lobe was concerning for adenocarcinoma.  Lymph node station  11 R, 4R, 7 were negative for malignancy.  No malignant cells identified in the right upper lobe.  Case was discussed at tumor board and given the hypermetabolism in the right upper lobe as well as hilum involvement cannot be ruled out despite negative biopsies.  Patient completed concurrent chemoradiation with weekly CarboTaxol in July 2022.  Scan showed partial response.  Maintenance durvalumab started on 02/27/2021    Interval history-tolerating treatments well without any significant side effects.  She has occasional cough and wheezing for which she is on nebulizer treatments at home as well.  Denies any new complaints at this time  ECOG PS- 1 Pain scale- 0   Review of systems- Review of Systems  Constitutional:  Negative for chills, fever, malaise/fatigue and weight loss.  HENT:  Negative for congestion, ear discharge and nosebleeds.   Eyes:  Negative for blurred vision.  Respiratory:  Positive for cough. Negative for hemoptysis, sputum production, shortness of breath and wheezing.   Cardiovascular:  Negative for chest pain, palpitations, orthopnea and claudication.  Gastrointestinal:  Negative for abdominal pain, blood in stool, constipation, diarrhea, heartburn, melena, nausea and vomiting.  Genitourinary:  Negative for dysuria, flank pain, frequency, hematuria and urgency.  Musculoskeletal:  Negative for back pain, joint pain and myalgias.  Skin:  Negative for rash.  Neurological:  Negative for dizziness, tingling, focal weakness, seizures, weakness and headaches.  Endo/Heme/Allergies:  Does not bruise/bleed easily.  Psychiatric/Behavioral:  Negative for depression and suicidal ideas. The patient does not have insomnia.       Allergies  Allergen Reactions   Ace Inhibitors Cough     Past Medical History:  Diagnosis Date  Anxiety    Asthma    Cancer ([place])    Cough    Dyspnea    with exertion   GERD (gastroesophageal reflux disease)    Hypertension    Lung cancer [facility])       Past Surgical History:  Procedure Laterality Date   BUNIONECTOMY Bilateral    COLONOSCOPY     COLONOSCOPY WITH PROPOFOL N/A 07/30/2021   Procedure: COLONOSCOPY WITH PROPOFOL;  Surgeon: Annamaria Helling, DO;  Location: [facility] ENDOSCOPY;  Service: Gastroenterology;  Laterality: N/A;   ESOPHAGOGASTRODUODENOSCOPY  04/01/2006   PORTA CATH INSERTION N/A 12/22/2020   Procedure: PORTA CATH INSERTION;  Surgeon: Algernon Huxley, MD;  Location: [place] CV LAB;  Service: Cardiovascular;  Laterality: N/A;   VIDEO BRONCHOSCOPY WITH ENDOBRONCHIAL NAVIGATION N/A 11/07/2020   Procedure: VIDEO BRONCHOSCOPY WITH ENDOBRONCHIAL NAVIGATION;  Surgeon: Ottie Glazier, MD;  Location: [facility] ORS;  Service: Thoracic;  Laterality: N/A;   VIDEO BRONCHOSCOPY WITH ENDOBRONCHIAL ULTRASOUND N/A 11/07/2020   Procedure: VIDEO BRONCHOSCOPY WITH ENDOBRONCHIAL ULTRASOUND;  Surgeon: Ottie Glazier, MD;  Location: [facility] ORS;  Service: Thoracic;  Laterality: N/A;    Social History   Socioeconomic History   Marital status: Married    Spouse name: Not on file   Number of children: Not on file   Years of education: Not on file   Highest education level: Not on file  Occupational History   Not on file  Tobacco Use   Smoking status: Former    Packs/day: 1.50    Years: 26.80    Total pack years: 40.20    Types: Cigarettes    Quit date: 11/07/1994    Years since quitting: 27.2   Smokeless tobacco: Never  Vaping Use   Vaping Use: Never used  Substance and Sexual Activity   Alcohol use: Yes    Comment:  wine daily   Drug use: Never   Sexual activity: Yes  Other Topics Concern   Not on file  Social History Narrative   Not on file   Social Determinants of Health   Financial Resource Strain: Not on file  Food Insecurity: Not on file  Transportation Needs: Not on file  Physical Activity: Not on file  Stress: Not on file  Social Connections: Not on file  Intimate Partner Violence: Not on file    Family  History  Problem Relation Age of Onset   Breast cancer Neg Hx      Current Outpatient Medications:    acetaminophen (TYLENOL) 500 MG tablet, Take 500 mg by mouth every 6 (six) hours as needed., Disp: , Rfl:    ADVAIR DISKUS 250-50 MCG/DOSE AEPB, Inhale 1 puff into the lungs as needed., Disp: , Rfl:    albuterol (ACCUNEB) 1.25 MG/3ML nebulizer solution, Take 1 ampule by nebulization in the morning and at bedtime., Disp: , Rfl:    ALPRAZolam (XANAX) 0.5 MG tablet, Take 0.5 mg by mouth at bedtime as needed for sleep., Disp: , Rfl:    amLODipine (NORVASC) 5 MG tablet, Take 5 mg by mouth at bedtime., Disp: , Rfl:    Cholecalciferol 50 MCG (2000 UT) CAPS, Take 2,000 Units by mouth daily., Disp: , Rfl:    DURVALUMAB IV, Inject into the vein every 14 (fourteen) days. For lung cancer, Disp: , Rfl:    esomeprazole (NEXIUM) 20 MG capsule, Take 20 mg by mouth as needed., Disp: , Rfl:    fluticasone (FLONASE) 50 MCG/ACT nasal spray, Place 2 sprays into both nostrils daily as needed  for rhinitis., Disp: , Rfl:    folic acid (FOLVITE) 1 MG tablet, Take 1 mg by mouth daily., Disp: , Rfl:    metoprolol succinate (TOPROL-XL) 50 MG 24 hr tablet, Take 50 mg by mouth every morning., Disp: , Rfl:    montelukast (SINGULAIR) 10 MG tablet, Take 1 tablet by mouth at bedtime., Disp: , Rfl:    zolpidem (AMBIEN) 10 MG tablet, Take 10 mg by mouth at bedtime as needed for sleep., Disp: , Rfl:    azelastine (ASTELIN) 0.1 % nasal spray, Place 2 sprays into both nostrils 2 (two) times daily. Use in each nostril as directed (Patient not taking: Reported on 11/06/2021), Disp: 30 mL, Rfl: 2   budesonide (PULMICORT) 0.25 MG/2ML nebulizer solution, Inhale into the lungs. (Patient not taking: Reported on 01/01/2022), Disp: , Rfl:    docusate sodium (COLACE) 100 MG capsule, Take 100 mg by mouth daily. (Patient not taking: Reported on 07/31/2021), Disp: , Rfl:  No current facility-administered medications for this  visit.  Facility-Administered Medications Ordered in Other Visits:    heparin lock flush 100 UNIT/ML injection, , , ,    heparin lock flush 100 UNIT/ML injection, , , ,    heparin lock flush 100 UNIT/ML injection, , , ,    heparin lock flush 100 UNIT/ML injection, , , ,    sodium chloride flush (NS) 0.9 % injection 10 mL, 10 mL, Intravenous, Once, Sindy Guadeloupe, MD   sodium chloride flush (NS) 0.9 % injection 10 mL, 10 mL, Intravenous, PRN, Sindy Guadeloupe, MD, 10 mL at 01/26/21 0807   sodium chloride flush (NS) 0.9 % injection 10 mL, 10 mL, Intracatheter, PRN, Sindy Guadeloupe, MD, 10 mL at 01/29/22 1331  Physical exam:  Vitals:   01/29/22 1305  BP: (!) 156/64  Pulse: 84  Resp: 16  Temp: (!) 97.5 F (36.4 C)  SpO2: 100%  Weight: 141 lb 8 oz (64.2 kg)   Physical Exam Constitutional:      General: She is not in acute distress. Cardiovascular:     Rate and Rhythm: Normal rate and regular rhythm.     Heart sounds: Normal heart sounds.  Pulmonary:     Effort: Pulmonary effort is normal.     Breath sounds: Normal breath sounds.     Comments: Decreased breath sounds over right lung base Abdominal:     General: Bowel sounds are normal.     Palpations: Abdomen is soft.  Skin:    General: Skin is warm and dry.  Neurological:     Mental Status: She is alert and oriented to person, place, and time.         Latest Ref Rng & Units 01/29/2022   12:48 PM  CMP  Glucose 70 - 99 mg/dL 101   BUN 8 - 23 mg/dL 7   Creatinine 0.44 - 1.00 mg/dL 0.44   Sodium 135 - 145 mmol/L 129   Potassium 3.5 - 5.1 mmol/L 3.9   Chloride 98 - 111 mmol/L 96   CO2 22 - 32 mmol/L 24   Calcium 8.9 - 10.3 mg/dL 9.3   Total Protein 6.5 - 8.1 g/dL 7.7   Total Bilirubin 0.3 - 1.2 mg/dL 0.8   Alkaline Phos 38 - 126 U/L 125   AST 15 - 41 U/L 25   ALT 0 - 44 U/L 21       Latest Ref Rng & Units 01/29/2022   12:48 PM  CBC  WBC 4.0 -  10.5 K/uL 4.6   Hemoglobin 12.0 - 15.0 g/dL 12.0   Hematocrit 36.0 - 46.0  % 34.7   Platelets 150 - 400 K/uL 257     No images are attached to the encounter.  DG Chest Port 1 View  Result Date: 01/19/2022 CLINICAL DATA:  Post thoracentesis.  RIGHT EXAM: PORTABLE CHEST 1 VIEW COMPARISON:  IR ultrasound, earlier same day. Chest XR, 03/27/2021. CT CAP, 01/08/2022. FINDINGS: Support lines: RIGHT IJ chest port with catheter tip at the proximal RIGHT atrium. Cardiac silhouette is within normal limits. The LEFT lung is inflated. RIGHT lung volume loss with perihilar and middle lobe consolidative opacity. Trace residual RIGHT pleural effusion. No pneumothorax. No interval osseous abnormality. IMPRESSION: 1. Trace residual RIGHT pleural effusion.  No pneumothorax. 2. RIGHT perihilar and middle lobe consolidative opacity with volume loss, likely posttreatment change. Electronically Signed   By: Michaelle Birks M.D.   On: 01/19/2022 15:10   US THORACENTESIS ASP PLEURAL SPACE W/IMG GUIDE  Result Date: 01/19/2022 INDICATION: Patient with history of right lung cancer and right-sided pleural effusion request received for diagnostic and therapeutic thoracentesis. EXAM: ULTRASOUND GUIDED RIGHT THORACENTESIS MEDICATIONS: Local 1% lidocaine only. COMPLICATIONS: None immediate. PROCEDURE: An ultrasound guided thoracentesis was thoroughly discussed with the patient and questions answered. The benefits, risks, alternatives and complications were also discussed. The patient understands and wishes to proceed with the procedure. Written consent was obtained. Ultrasound was performed to localize and mark an adequate pocket of fluid in the right chest. The area was then prepped and draped in the normal sterile fashion. 1% Lidocaine was used for local anesthesia. Under ultrasound guidance a 19 gauge, 7-cm, Yueh catheter was introduced. Thoracentesis was performed. The catheter was removed and a dressing applied. FINDINGS: A total of approximately 650 mL of clear yellow fluid was removed. Samples were sent to  the laboratory as requested by the clinical team. IMPRESSION: Successful ultrasound guided right thoracentesis yielding 650 mL of pleural fluid. This exam was performed by Tsosie Billing PA-C, and was supervised and interpreted by Dr. Serafina Royals. Electronically Signed   By: Ruthann Cancer M.D.   On: 01/19/2022 14:58   CT CHEST ABDOMEN PELVIS W CONTRAST  Result Date: 01/10/2022 CLINICAL DATA:  Right lower lobe lung cancer.  Restaging. EXAM: CT CHEST, ABDOMEN, AND PELVIS WITH CONTRAST TECHNIQUE: Multidetector CT imaging of the chest, abdomen and pelvis was performed following the standard protocol during bolus administration of intravenous contrast. RADIATION DOSE REDUCTION: This exam was performed according to the departmental dose-optimization program which includes automated exposure control, adjustment of the mA and/or kV according to patient size and/or use of iterative reconstruction technique. CONTRAST:  65mL OMNIPAQUE IOHEXOL 300 MG/ML  SOLN COMPARISON:  CT of the chest abdomen pelvis dated 09/30/2021. FINDINGS: CT CHEST FINDINGS Cardiovascular: There is no cardiomegaly or pericardial effusion. Mild atherosclerotic calcification of the thoracic aorta. No aneurysmal dilatation or dissection. The origins of the great vessels of the aortic arch and the central pulmonary arteries appear patent as visualized. Mediastinum/Nodes: Similar appearance of several right paratracheal lymph nodes measure up to 7 mm in short axis. Slight interval increase in the size of a lymph node to the left of the trachea (16/2) measuring 4 mm in short axis (previously approximately 2 mm). The esophagus and the thyroid gland are grossly unremarkable. No mediastinal fluid collection. Lungs/Pleura: An area of consolidation with air bronchogram involving the right perihilar region and predominant involvement of the right lower lobe appears similar or slightly progressed since the  prior CT. Findings likely represent post treatment changes.  PET-CT may provide a better evaluation if there is clinical concern for recurrent disease. A 3 mm nodule in the left lower lobe (77/3) was present on the prior CT. There is a small right pleural effusion, increased in size since the prior CT. No pneumothorax. Musculoskeletal: No acute osseous pathology. No suspicious bone lesions. CT ABDOMEN PELVIS FINDINGS No intra-abdominal free air or free fluid. Hepatobiliary: No focal liver abnormality is seen. No gallstones, gallbladder wall thickening, or biliary dilatation. Pancreas: Unremarkable. No pancreatic ductal dilatation or surrounding inflammatory changes. Spleen: Normal in size without focal abnormality. Adrenals/Urinary Tract: The adrenal glands unremarkable. There is no hydronephrosis on either side. There is symmetric enhancement and excretion of contrast by both kidneys. The visualized ureters and urinary bladder appear unremarkable. Stomach/Bowel: There is no bowel obstruction or active inflammation. The appendix is normal. Vascular/Lymphatic: Moderate aortoiliac atherosclerotic disease. The IVC is unremarkable. No portal venous gas. There is no adenopathy. Reproductive: The uterus and ovaries are grossly unremarkable. No adnexal masses. Other: None Musculoskeletal: Degenerative changes of the spine. No acute osseous pathology. No suspicious bone lesions. Bilateral femoral head avascular necrosis. IMPRESSION: 1. Progression of area of consolidation in the right lower lobe and right perihilar region. Although this may represent post treatment changes and fibrosis, recurrent disease is not excluded given increase in the size of the right pleural effusion. Clinical correlation recommended. PET-CT or thoracentesis may provide additional diagnostic value. 2. No evidence of metastatic disease in the abdomen, or pelvis. 3. Bilateral femoral head avascular necrosis. 4. Aortic Atherosclerosis (ICD10-I70.0). Electronically Signed   By: Anner Crete M.D.   On:  01/10/2022 01:14     Assessment and plan- Patient is a 66 y.o. 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  She is here for on treatment assessment prior to cycle 19 of maintenance durvalumab  Counts okay to proceed with cycle 19 of maintenance durvalumab today and I will see her back in 4 weeks for cycle 20 which will be her last cycle.  I have also reviewed her CT chest images independently and discussed findings with the patient.  Her case was also discussed at tumor board.There continues to be consolidative changes involving the right lower lobe but it is unclear if this all represents changes from radiation and old inflammation versus recurrent disease.  Given that there was increased right pleural effusion on that side we did get a thoracentesis which did not show any evidence of infection or malignancy and cytology was negative.  The plan is to get a repeat PET CT scan sometime in early September 2023.  If patient were to be symptomatic and were to develop worsening shortness of breath I will consider getting an x-ray and a repeat thoracentesis.  Patient verbalized understanding of the plan   Visit Diagnosis 1. Primary lung adenocarcinoma, right ([place])   2. Encounter for antineoplastic immunotherapy      Dr. Randa Evens, MD, MPH [facility] at [facility] [phone] 01/29/2022 3:37 PM

## 2022-01-29 NOTE — Patient Instructions (Signed)
[facility] CANCER CTR AT [place]  Discharge Instructions: Thank you for choosing [place] to provide your oncology and hematology care.  If you have a lab appointment with the [place], please go directly to the [place] and check in at the registration area.  Wear comfortable clothing and clothing appropriate for easy access to any Portacath or PICC line.   We strive to give you quality time with your provider. You may need to reschedule your appointment if you arrive late (15 or more minutes).  Arriving late affects you and other patients whose appointments are after yours.  Also, if you miss three or more appointments without notifying the office, you may be dismissed from the clinic at the provider's discretion.      For prescription refill requests, have your pharmacy contact our office and allow 72 hours for refills to be completed.    Today you received the following chemotherapy and/or immunotherapy agents Durvalumab.       To help prevent nausea and vomiting after your treatment, we encourage you to take your nausea medication as directed.  BELOW ARE SYMPTOMS THAT SHOULD BE REPORTED IMMEDIATELY: *FEVER GREATER THAN 100.4 F (38 C) OR HIGHER *CHILLS OR SWEATING *NAUSEA AND VOMITING THAT IS NOT CONTROLLED WITH YOUR NAUSEA MEDICATION *UNUSUAL SHORTNESS OF BREATH *UNUSUAL BRUISING OR BLEEDING *URINARY PROBLEMS (pain or burning when urinating, or frequent urination) *BOWEL PROBLEMS (unusual diarrhea, constipation, pain near the anus) TENDERNESS IN MOUTH AND THROAT WITH OR WITHOUT PRESENCE OF ULCERS (sore throat, sores in mouth, or a toothache) UNUSUAL RASH, SWELLING OR PAIN  UNUSUAL VAGINAL DISCHARGE OR ITCHING   Items with * indicate a potential emergency and should be followed up as soon as possible or go to the Emergency Department if any problems should occur.  Please show the CHEMOTHERAPY ALERT CARD or IMMUNOTHERAPY ALERT CARD at check-in  to the Emergency Department and triage nurse.  Should you have questions after your visit or need to cancel or reschedule your appointment, please contact [facility] CANCER [place] AT [place]  [phone] and follow the prompts.  Office hours are 8:00 a.m. to 4:30 p.m. Monday - Friday. Please note that voicemails left after 4:00 p.m. may not be returned until the following business day.  We are closed weekends and major holidays. You have access to a nurse at all times for urgent questions. Please call the main number to the clinic [phone] and follow the prompts.  For any non-urgent questions, you may also contact your provider using MyChart. We now offer e-Visits for anyone 23 and older to request care online for non-urgent symptoms. For details visit [email or web address].   Also download the MyChart app! Go to the app store, search "MyChart", open the app, select [place], and log in with your MyChart username and password.  Masks are optional in the cancer centers. If you would like for your care team to wear a mask while they are taking care of you, please let them know. For doctor visits, patients may have with them one support person who is at least 65 years old. At this time, visitors are not allowed in the infusion area.

## 2022-02-22 ENCOUNTER — Other Ambulatory Visit: Payer: Self-pay

## 2022-02-26 ENCOUNTER — Inpatient Hospital Stay: Payer: BC Managed Care – PPO

## 2022-02-26 ENCOUNTER — Other Ambulatory Visit: Payer: Self-pay

## 2022-02-26 ENCOUNTER — Inpatient Hospital Stay: Payer: BC Managed Care – PPO | Attending: Oncology

## 2022-02-26 ENCOUNTER — Inpatient Hospital Stay (HOSPITAL_BASED_OUTPATIENT_CLINIC_OR_DEPARTMENT_OTHER): Payer: BC Managed Care – PPO | Admitting: Oncology

## 2022-02-26 ENCOUNTER — Encounter: Payer: Self-pay | Admitting: Oncology

## 2022-02-26 VITALS — BP 147/78 | HR 79 | Temp 97.7°F | Resp 16 | Wt 143.0 lb

## 2022-02-26 DIAGNOSIS — C3431 Malignant neoplasm of lower lobe, right bronchus or lung: Secondary | ICD-10-CM | POA: Insufficient documentation

## 2022-02-26 DIAGNOSIS — Z87891 Personal history of nicotine dependence: Secondary | ICD-10-CM | POA: Diagnosis not present

## 2022-02-26 DIAGNOSIS — Z5112 Encounter for antineoplastic immunotherapy: Secondary | ICD-10-CM

## 2022-02-26 DIAGNOSIS — Z79899 Other long term (current) drug therapy: Secondary | ICD-10-CM | POA: Insufficient documentation

## 2022-02-26 DIAGNOSIS — C3491 Malignant neoplasm of unspecified part of right bronchus or lung: Secondary | ICD-10-CM

## 2022-02-26 LAB — CBC WITH DIFFERENTIAL/PLATELET
Abs Immature Granulocytes: 0.01 10*3/uL (ref 0.00–0.07)
Basophils Absolute: 0 10*3/uL (ref 0.0–0.1)
Basophils Relative: 1 %
Eosinophils Absolute: 0.2 10*3/uL (ref 0.0–0.5)
Eosinophils Relative: 4 %
HCT: 36.7 % (ref 36.0–46.0)
Hemoglobin: 12.6 g/dL (ref 12.0–15.0)
Immature Granulocytes: 0 %
Lymphocytes Relative: 24 %
Lymphs Abs: 0.9 10*3/uL (ref 0.7–4.0)
MCH: 34.1 pg — ABNORMAL HIGH (ref 26.0–34.0)
MCHC: 34.3 g/dL (ref 30.0–36.0)
MCV: 99.5 fL (ref 80.0–100.0)
Monocytes Absolute: 0.5 10*3/uL (ref 0.1–1.0)
Monocytes Relative: 14 %
Neutro Abs: 2.2 10*3/uL (ref 1.7–7.7)
Neutrophils Relative %: 57 %
Platelets: 281 10*3/uL (ref 150–400)
RBC: 3.69 MIL/uL — ABNORMAL LOW (ref 3.87–5.11)
RDW: 12.7 % (ref 11.5–15.5)
WBC: 3.8 10*3/uL — ABNORMAL LOW (ref 4.0–10.5)
nRBC: 0 % (ref 0.0–0.2)

## 2022-02-26 LAB — COMPREHENSIVE METABOLIC PANEL
ALT: 18 U/L (ref 0–44)
AST: 28 U/L (ref 15–41)
Albumin: 4.3 g/dL (ref 3.5–5.0)
Alkaline Phosphatase: 136 U/L — ABNORMAL HIGH (ref 38–126)
Anion gap: 7 (ref 5–15)
BUN: 8 mg/dL (ref 8–23)
CO2: 24 mmol/L (ref 22–32)
Calcium: 9.2 mg/dL (ref 8.9–10.3)
Chloride: 99 mmol/L (ref 98–111)
Creatinine, Ser: 0.65 mg/dL (ref 0.44–1.00)
GFR, Estimated: >60 mL/min (ref >60)
Glucose, Bld: 146 mg/dL — ABNORMAL HIGH (ref 70–99)
Potassium: 4 mmol/L (ref 3.5–5.1)
Sodium: 130 mmol/L — ABNORMAL LOW (ref 135–145)
Total Bilirubin: 0.4 mg/dL (ref 0.3–1.2)
Total Protein: 8 g/dL (ref 6.5–8.1)

## 2022-02-26 MED ORDER — HEPARIN SOD (PORK) LOCK FLUSH 100 UNIT/ML IV SOLN
INTRAVENOUS | Status: AC
  Administered 2022-02-26: 500 [IU]
  Filled 2022-02-26: qty 5

## 2022-02-26 MED ORDER — SODIUM CHLORIDE 0.9% FLUSH
10.0000 mL | Freq: Once | INTRAVENOUS | Status: AC
  Administered 2022-02-26: 10 mL via INTRAVENOUS
  Filled 2022-02-26: qty 10

## 2022-02-26 MED ORDER — SODIUM CHLORIDE 0.9 % IV SOLN
Freq: Once | INTRAVENOUS | Status: AC
  Filled 2022-02-26: qty 250

## 2022-02-26 MED ORDER — SODIUM CHLORIDE 0.9 % IV SOLN
1500.0000 mg | Freq: Once | INTRAVENOUS | Status: AC
  Administered 2022-02-26: 1500 mg via INTRAVENOUS
  Filled 2022-02-26: qty 30

## 2022-02-26 MED ORDER — HEPARIN SOD (PORK) LOCK FLUSH 100 UNIT/ML IV SOLN
500.0000 [IU] | Freq: Once | INTRAVENOUS | Status: AC | PRN
  Filled 2022-02-26: qty 5

## 2022-02-26 NOTE — Patient Instructions (Signed)
[facility] CANCER CTR AT [place]  Discharge Instructions: Thank you for choosing [place] to provide your oncology and hematology care.  If you have a lab appointment with the [place], please go directly to the [place] and check in at the registration area.  Wear comfortable clothing and clothing appropriate for easy access to any Portacath or PICC line.   We strive to give you quality time with your provider. You may need to reschedule your appointment if you arrive late (15 or more minutes).  Arriving late affects you and other patients whose appointments are after yours.  Also, if you miss three or more appointments without notifying the office, you may be dismissed from the clinic at the provider's discretion.      For prescription refill requests, have your pharmacy contact our office and allow 72 hours for refills to be completed.    Today you received the following chemotherapy and/or immunotherapy agents Imfiniz   To help prevent nausea and vomiting after your treatment, we encourage you to take your nausea medication as directed.  BELOW ARE SYMPTOMS THAT SHOULD BE REPORTED IMMEDIATELY: *FEVER GREATER THAN 100.4 F (38 C) OR HIGHER *CHILLS OR SWEATING *NAUSEA AND VOMITING THAT IS NOT CONTROLLED WITH YOUR NAUSEA MEDICATION *UNUSUAL SHORTNESS OF BREATH *UNUSUAL BRUISING OR BLEEDING *URINARY PROBLEMS (pain or burning when urinating, or frequent urination) *BOWEL PROBLEMS (unusual diarrhea, constipation, pain near the anus) TENDERNESS IN MOUTH AND THROAT WITH OR WITHOUT PRESENCE OF ULCERS (sore throat, sores in mouth, or a toothache) UNUSUAL RASH, SWELLING OR PAIN  UNUSUAL VAGINAL DISCHARGE OR ITCHING   Items with * indicate a potential emergency and should be followed up as soon as possible or go to the Emergency Department if any problems should occur.  Please show the CHEMOTHERAPY ALERT CARD or IMMUNOTHERAPY ALERT CARD at check-in to the  Emergency Department and triage nurse.  Should you have questions after your visit or need to cancel or reschedule your appointment, please contact [facility] CANCER [place] AT [place]  [phone] and follow the prompts.  Office hours are 8:00 a.m. to 4:30 p.m. Monday - Friday. Please note that voicemails left after 4:00 p.m. may not be returned until the following business day.  We are closed weekends and major holidays. You have access to a nurse at all times for urgent questions. Please call the main number to the clinic [phone] and follow the prompts.  For any non-urgent questions, you may also contact your provider using MyChart. We now offer e-Visits for anyone 32 and older to request care online for non-urgent symptoms. For details visit [email or web address].   Also download the MyChart app! Go to the app store, search "MyChart", open the app, select [place], and log in with your MyChart username and password.  Masks are optional in the cancer centers. If you would like for your care team to wear a mask while they are taking care of you, please let them know. For doctor visits, patients may have with them one support person who is at least 65 years old. At this time, visitors are not allowed in the infusion area.

## 2022-02-26 NOTE — Progress Notes (Unsigned)
Pt states she has noticed random bruising on her arms and not sure what it may be coming from since she is not on any blood thinners.

## 2022-02-28 ENCOUNTER — Encounter: Payer: Self-pay | Admitting: Oncology

## 2022-02-28 NOTE — Progress Notes (Signed)
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  She quit smoking about 26 years ago.  Other medical problems include hypertension and hyponatremia.She has been treated for iron of possible pneumonia for the last 1 year.  She has a history of intermittent asthma for which she uses Advair.  She was seen by Dr. And underwent CT chest which showed dense infiltrate/solid mass in the right lower lobe with contiguous airspace opacity in the right upper lobe.  Findings could be secondary to pneumonia but concerning for bronchogenic carcinoma.  Patient then had a PET CT scan which showed consolidation in the medial aspect of the right lower lobe measuring 3.4 x 4 cm with an SUV of 11.6.  Hypermetabolic masslike thickening in the right hilum measuring 2.2 cm with intense hypermetabolic activity.  Groundglass airspace consolidation in the posterior aspect of the right upper lobe with an SUV of 11.4.  The right upper lobe opacity was nonspecific and differentials include pulmonary infection versus lymphangitic spread of carcinoma.  CT chest showed showed masslike area of distortion involving right lower lobe middle lobe as well as right upper lobe.  Generalized right hilar masslike appearance concerning for nodal involvement.   Patient underwent bronchoscopy and biopsies.Right lower lobe was concerning for adenocarcinoma.  Lymph node station 11  R, 4R, 7 were negative for malignancy.  No malignant cells identified in the right upper lobe.  Case was discussed at tumor board and given the hypermetabolism in the right upper lobe as well as hilum involvement cannot be ruled out despite negative biopsies.  Patient completed concurrent chemoradiation with weekly CarboTaxol in July 2022.  Scan showed partial response.  Maintenance durvalumab started on 02/27/2021    Interval history-overall she is doing well.  She has on and off cough but does not affect her ADLs significantly.  Appetite and weight have remained stable.  ECOG PS- 1 Pain scale- 0   Review of systems- Review of Systems  Constitutional:  Negative for chills, fever, malaise/fatigue and weight loss.  HENT:  Negative for congestion, ear discharge and nosebleeds.   Eyes:  Negative for blurred vision.  Respiratory:  Negative for cough, hemoptysis, sputum production, shortness of breath and wheezing.   Cardiovascular:  Negative for chest pain, palpitations, orthopnea and claudication.  Gastrointestinal:  Negative for abdominal pain, blood in stool, constipation, diarrhea, heartburn, melena, nausea and vomiting.  Genitourinary:  Negative for dysuria, flank pain, frequency, hematuria and urgency.  Musculoskeletal:  Negative for back pain, joint pain and myalgias.  Skin:  Negative for rash.  Neurological:  Negative for dizziness, tingling, focal weakness, seizures, weakness and headaches.  Endo/Heme/Allergies:  Does not bruise/bleed easily.  Psychiatric/Behavioral:  Negative for depression and suicidal ideas. The patient does not have insomnia.       Allergies  Allergen Reactions   Ace Inhibitors Cough     Past Medical History:  Diagnosis Date   Anxiety    Asthma    Cancer (  [place])    Cough    Dyspnea    with exertion   GERD (gastroesophageal reflux disease)    Hypertension    Lung cancer [facility])      Past Surgical History:  Procedure Laterality Date   BUNIONECTOMY  Bilateral    COLONOSCOPY     COLONOSCOPY WITH PROPOFOL N/A 07/30/2021   Procedure: COLONOSCOPY WITH PROPOFOL;  Surgeon: Annamaria Helling, DO;  Location: [facility] ENDOSCOPY;  Service: Gastroenterology;  Laterality: N/A;   ESOPHAGOGASTRODUODENOSCOPY  04/01/2006   PORTA CATH INSERTION N/A 12/22/2020   Procedure: PORTA CATH INSERTION;  Surgeon: Algernon Huxley, MD;  Location: [place] [facility];  Service: Cardiovascular;  Laterality: N/A;   VIDEO BRONCHOSCOPY WITH ENDOBRONCHIAL NAVIGATION N/A 11/07/2020   Procedure: VIDEO BRONCHOSCOPY WITH ENDOBRONCHIAL NAVIGATION;  Surgeon: Ottie Glazier, MD;  Location: [facility] ORS;  Service: Thoracic;  Laterality: N/A;   VIDEO BRONCHOSCOPY WITH ENDOBRONCHIAL ULTRASOUND N/A 11/07/2020   Procedure: VIDEO BRONCHOSCOPY WITH ENDOBRONCHIAL ULTRASOUND;  Surgeon: Ottie Glazier, MD;  Location: [facility] ORS;  Service: Thoracic;  Laterality: N/A;    Social History   Socioeconomic History   Marital status: Married    Spouse name: Not on file   Number of children: Not on file   Years of education: Not on file   Highest education level: Not on file  Occupational History   Not on file  Tobacco Use   Smoking status: Former    Packs/day: 1.50    Years: 26.80    Total pack years: 40.20    Types: Cigarettes    Quit date: 11/07/1994    Years since quitting: 27.3   Smokeless tobacco: Never  Vaping Use   Vaping Use: Never used  Substance and Sexual Activity   Alcohol use: Yes    Comment:  wine daily   Drug use: Never   Sexual activity: Yes  Other Topics Concern   Not on file  Social History Narrative   Not on file   Social Determinants of Health   Financial Resource Strain: Not on file  Food Insecurity: Not on file  Transportation Needs: Not on file  Physical Activity: Not on file  Stress: Not on file  Social Connections: Not on file  Intimate Partner Violence: Not on file    Family History  Problem Relation Age of Onset   Breast cancer Neg Hx       Current Outpatient Medications:    acetaminophen (TYLENOL) 500 MG tablet, Take 500 mg by mouth every 6 (six) hours as needed., Disp: , Rfl:    ADVAIR DISKUS 250-50 MCG/DOSE AEPB, Inhale 1 puff into the lungs as needed., Disp: , Rfl:    albuterol (ACCUNEB) 1.25 MG/3ML nebulizer solution, Take 1 ampule by nebulization in the morning and at bedtime., Disp: , Rfl:    ALPRAZolam (XANAX) 0.5 MG tablet, Take 0.5 mg by mouth at bedtime as needed for sleep., Disp: , Rfl:    amLODipine (NORVASC) 5 MG tablet, Take 5 mg by mouth at bedtime., Disp: , Rfl:    azelastine (ASTELIN) 0.1 % nasal spray, Place 2 sprays into both nostrils 2 (two) times daily. Use in each nostril as directed, Disp: 30 mL, Rfl: 2   Cholecalciferol 50 MCG (2000 UT) CAPS, Take 2,000 Units by mouth daily., Disp: , Rfl:    DURVALUMAB IV, Inject into the vein every 14 (fourteen) days. For lung cancer, Disp: , Rfl:    fluticasone (FLONASE) 50 MCG/ACT nasal spray, Place 2 sprays into both nostrils daily  as needed for rhinitis., Disp: , Rfl:    folic acid (FOLVITE) 1 MG tablet, Take 1 mg by mouth daily., Disp: , Rfl:    levocetirizine (XYZAL) 5 MG tablet, Take 5 mg by mouth every evening., Disp: , Rfl:    metoprolol succinate (TOPROL-XL) 50 MG 24 hr tablet, Take 50 mg by mouth every morning., Disp: , Rfl:    sulfamethoxazole-trimethoprim (BACTRIM) 400-80 MG tablet, Take by mouth., Disp: , Rfl:    zolpidem (AMBIEN) 10 MG tablet, Take 10 mg by mouth at bedtime as needed for sleep., Disp: , Rfl:    budesonide (PULMICORT) 0.25 MG/2ML nebulizer solution, Inhale into the lungs. (Patient not taking: Reported on 01/01/2022), Disp: , Rfl:    docusate sodium (COLACE) 100 MG capsule, Take 100 mg by mouth daily. (Patient not taking: Reported on 07/31/2021), Disp: , Rfl:    esomeprazole (NEXIUM) 20 MG capsule, Take 20 mg by mouth as needed. (Patient not taking: Reported on 02/26/2022), Disp: , Rfl:    montelukast (SINGULAIR) 10 MG tablet, Take 1 tablet  by mouth at bedtime. (Patient not taking: Reported on 02/26/2022), Disp: , Rfl:  No current facility-administered medications for this visit.  Facility-Administered Medications Ordered in Other Visits:    heparin lock flush 100 UNIT/ML injection, , , ,    heparin lock flush 100 UNIT/ML injection, , , ,    heparin lock flush 100 UNIT/ML injection, , , ,    heparin lock flush 100 UNIT/ML injection, , , ,    sodium chloride flush (NS) 0.9 % injection 10 mL, 10 mL, Intravenous, Once, Sindy Guadeloupe, MD   sodium chloride flush (NS) 0.9 % injection 10 mL, 10 mL, Intravenous, PRN, Sindy Guadeloupe, MD, 10 mL at 01/26/21 0807  Physical exam:  Vitals:   02/26/22 1330  BP: (!) 147/78  Pulse: 79  Resp: 16  Temp: 97.7 F (36.5 C)  SpO2: 100%  Weight: 143 lb (64.9 kg)   Physical Exam Cardiovascular:     Rate and Rhythm: Normal rate and regular rhythm.     Heart sounds: Normal heart sounds.  Pulmonary:     Effort: Pulmonary effort is normal.     Breath sounds: Normal breath sounds.  Abdominal:     General: Bowel sounds are normal.     Palpations: Abdomen is soft.  Skin:    General: Skin is warm and dry.  Neurological:     Mental Status: She is alert and oriented to person, place, and time.         Latest Ref Rng & Units 02/26/2022    1:17 PM  CMP  Glucose 70 - 99 mg/dL 146   BUN 8 - 23 mg/dL 8   Creatinine 0.44 - 1.00 mg/dL 0.65   Sodium 135 - 145 mmol/L 130   Potassium 3.5 - 5.1 mmol/L 4.0   Chloride 98 - 111 mmol/L 99   CO2 22 - 32 mmol/L 24   Calcium 8.9 - 10.3 mg/dL 9.2   Total Protein 6.5 - 8.1 g/dL 8.0   Total Bilirubin 0.3 - 1.2 mg/dL 0.4   Alkaline Phos 38 - 126 U/L 136   AST 15 - 41 U/L 28   ALT 0 - 44 U/L 18       Latest Ref Rng & Units 02/26/2022    1:17 PM  CBC  WBC 4.0 - 10.5 K/uL 3.8   Hemoglobin 12.0 - 15.0 g/dL 12.6   Hematocrit 36.0 - 46.0 % 36.7  Platelets 150 - 400 K/uL 281     Assessment and plan- Patient is a 66 y.o. female with adenocarcinoma of  the right lung clinical stage IIIA cT3 N1 M0.  He is here for on treatment assessment prior to cycle 20 of maintenance durvalumab  Counts okay to proceed with cycle 20 of maintenance durvalumab which is her last cycle.  I plan to repeat PET CT scan sometime in early to mid September and see her thereafter.  She has significant changes of postradiation as well as chronic inflammation in her right lung and it has been difficult to distinguish if there is any underlying malignancy versus chronic nonmalignant process.  We recently did right-sided thoracentesis as well which was negative for malignancy.  Therefore no convincing evidence of progressive malignancy at this time.  If scans overall shows stable disease my plan is to watch her off durvalumab.  She will have her port in place for now and we will decide if he should get it removed down the line   Visit Diagnosis 1. Primary lung adenocarcinoma, right ([place])   2. Encounter for antineoplastic immunotherapy      Dr. Randa Evens, MD, MPH [facility] [phone] 02/28/2022 11:15 AM

## 2022-03-02 ENCOUNTER — Other Ambulatory Visit: Payer: Self-pay

## 2022-03-24 ENCOUNTER — Ambulatory Visit: Payer: BC Managed Care – PPO | Admitting: Radiation Oncology

## 2022-03-26 ENCOUNTER — Ambulatory Visit: Payer: BC Managed Care – PPO | Admitting: Radiation Oncology

## 2022-04-09 ENCOUNTER — Encounter: Payer: Self-pay | Admitting: Oncology

## 2022-04-13 ENCOUNTER — Encounter
Admission: RE | Admit: 2022-04-13 | Discharge: 2022-04-13 | Disposition: A | Discharge status: Home / Self Care | Payer: BC Managed Care – PPO | Source: Ambulatory Visit | Attending: Oncology | Admitting: Oncology

## 2022-04-13 DIAGNOSIS — C3431 Malignant neoplasm of lower lobe, right bronchus or lung: Secondary | ICD-10-CM | POA: Insufficient documentation

## 2022-04-13 DIAGNOSIS — C3491 Malignant neoplasm of unspecified part of right bronchus or lung: Secondary | ICD-10-CM | POA: Insufficient documentation

## 2022-04-13 LAB — GLUCOSE, CAPILLARY: Glucose-Capillary: 105 mg/dL — ABNORMAL HIGH (ref 70–99)

## 2022-04-13 MED ORDER — FLUDEOXYGLUCOSE F - 18 (FDG) INJECTION
7.4000 | Freq: Once | INTRAVENOUS | Status: AC | PRN
  Administered 2022-04-13: 7.74 via INTRAVENOUS

## 2022-04-14 ENCOUNTER — Telehealth: Payer: Self-pay | Admitting: *Deleted

## 2022-04-14 DIAGNOSIS — C3491 Malignant neoplasm of unspecified part of right bronchus or lung: Secondary | ICD-10-CM

## 2022-04-14 NOTE — Telephone Encounter (Signed)
-----   Message from Sindy Guadeloupe, MD sent at 04/14/2022  1:56 PM EDT ----- Yes with cytology check and please reach out to dr Mickey Farber office as well ----- Message ----- From: Telford Nab, RN Sent: 04/14/2022   1:43 PM EDT To: Sindy Guadeloupe, MD  Pt states that she had a feeling she had fluid on her lungs again and only reports productive cough for the past 2 weeks. Do you want me to go ahead and schedule her for thoracentesis?

## 2022-04-14 NOTE — Telephone Encounter (Signed)
Pt scheduled for thoracentesis on 9/14 at 1pm. Pt instructed to arrive at 1230pm at the [facility] to register. Pt verbalized understanding. Informed to keep appt with Dr. Janese Banks as scheduled on 9/15.

## 2022-04-15 ENCOUNTER — Ambulatory Visit
Admission: RE | Admit: 2022-04-15 | Discharge: 2022-04-15 | Disposition: A | Discharge status: Home / Self Care | Payer: BC Managed Care – PPO | Source: Ambulatory Visit | Attending: Oncology | Admitting: Oncology

## 2022-04-15 ENCOUNTER — Other Ambulatory Visit: Payer: Self-pay | Admitting: Radiology

## 2022-04-15 ENCOUNTER — Ambulatory Visit
Admission: RE | Admit: 2022-04-15 | Discharge: 2022-04-15 | Disposition: A | Discharge status: Home / Self Care | Payer: BC Managed Care – PPO | Source: Ambulatory Visit | Attending: Radiology | Admitting: Radiology

## 2022-04-15 DIAGNOSIS — Z85118 Personal history of other malignant neoplasm of bronchus and lung: Secondary | ICD-10-CM | POA: Insufficient documentation

## 2022-04-15 DIAGNOSIS — J90 Pleural effusion, not elsewhere classified: Secondary | ICD-10-CM

## 2022-04-15 DIAGNOSIS — Z9221 Personal history of antineoplastic chemotherapy: Secondary | ICD-10-CM | POA: Insufficient documentation

## 2022-04-15 DIAGNOSIS — Z9889 Other specified postprocedural states: Secondary | ICD-10-CM

## 2022-04-15 DIAGNOSIS — C3491 Malignant neoplasm of unspecified part of right bronchus or lung: Secondary | ICD-10-CM

## 2022-04-15 NOTE — Procedures (Signed)
PROCEDURE SUMMARY:  Successful US guided right thoracentesis. Yielded 1 Liter of clear yellow fluid. Pt tolerated procedure well. No immediate complications.  Specimen was sent for labs. CXR ordered.  EBL < 1 mL  Tsosie Billing D PA-C 04/15/2022 1:48 PM

## 2022-04-16 ENCOUNTER — Encounter: Payer: Self-pay | Admitting: Oncology

## 2022-04-16 ENCOUNTER — Inpatient Hospital Stay: Payer: BC Managed Care – PPO | Attending: Oncology

## 2022-04-16 ENCOUNTER — Inpatient Hospital Stay (HOSPITAL_BASED_OUTPATIENT_CLINIC_OR_DEPARTMENT_OTHER): Payer: BC Managed Care – PPO | Admitting: Oncology

## 2022-04-16 VITALS — BP 140/85 | HR 92 | Temp 97.7°F | Resp 16 | Ht 65.0 in | Wt 140.0 lb

## 2022-04-16 DIAGNOSIS — Z87891 Personal history of nicotine dependence: Secondary | ICD-10-CM | POA: Diagnosis not present

## 2022-04-16 DIAGNOSIS — Z85118 Personal history of other malignant neoplasm of bronchus and lung: Secondary | ICD-10-CM | POA: Diagnosis not present

## 2022-04-16 DIAGNOSIS — Z95828 Presence of other vascular implants and grafts: Secondary | ICD-10-CM

## 2022-04-16 DIAGNOSIS — C3491 Malignant neoplasm of unspecified part of right bronchus or lung: Secondary | ICD-10-CM

## 2022-04-16 DIAGNOSIS — Z08 Encounter for follow-up examination after completed treatment for malignant neoplasm: Secondary | ICD-10-CM

## 2022-04-16 DIAGNOSIS — C3431 Malignant neoplasm of lower lobe, right bronchus or lung: Secondary | ICD-10-CM | POA: Insufficient documentation

## 2022-04-16 LAB — CBC WITH DIFFERENTIAL/PLATELET
Abs Immature Granulocytes: 0.02 10*3/uL (ref 0.00–0.07)
Basophils Absolute: 0 10*3/uL (ref 0.0–0.1)
Basophils Relative: 0 %
Eosinophils Absolute: 0.1 10*3/uL (ref 0.0–0.5)
Eosinophils Relative: 3 %
HCT: 36.2 % (ref 36.0–46.0)
Hemoglobin: 12.5 g/dL (ref 12.0–15.0)
Immature Granulocytes: 0 %
Lymphocytes Relative: 21 %
Lymphs Abs: 1.1 10*3/uL (ref 0.7–4.0)
MCH: 34.3 pg — ABNORMAL HIGH (ref 26.0–34.0)
MCHC: 34.5 g/dL (ref 30.0–36.0)
MCV: 99.5 fL (ref 80.0–100.0)
Monocytes Absolute: 0.5 10*3/uL (ref 0.1–1.0)
Monocytes Relative: 10 %
Neutro Abs: 3.5 10*3/uL (ref 1.7–7.7)
Neutrophils Relative %: 66 %
Platelets: 283 10*3/uL (ref 150–400)
RBC: 3.64 MIL/uL — ABNORMAL LOW (ref 3.87–5.11)
RDW: 13 % (ref 11.5–15.5)
WBC: 5.3 10*3/uL (ref 4.0–10.5)
nRBC: 0 % (ref 0.0–0.2)

## 2022-04-16 LAB — COMPREHENSIVE METABOLIC PANEL
ALT: 18 U/L (ref 0–44)
AST: 24 U/L (ref 15–41)
Albumin: 4 g/dL (ref 3.5–5.0)
Alkaline Phosphatase: 122 U/L (ref 38–126)
Anion gap: 10 (ref 5–15)
BUN: 8 mg/dL (ref 8–23)
CO2: 23 mmol/L (ref 22–32)
Calcium: 9.1 mg/dL (ref 8.9–10.3)
Chloride: 99 mmol/L (ref 98–111)
Creatinine, Ser: 0.69 mg/dL (ref 0.44–1.00)
GFR, Estimated: >60 mL/min (ref >60)
Glucose, Bld: 116 mg/dL — ABNORMAL HIGH (ref 70–99)
Potassium: 4.2 mmol/L (ref 3.5–5.1)
Sodium: 132 mmol/L — ABNORMAL LOW (ref 135–145)
Total Bilirubin: 0.8 mg/dL (ref 0.3–1.2)
Total Protein: 7.8 g/dL (ref 6.5–8.1)

## 2022-04-16 MED ORDER — SODIUM CHLORIDE 0.9% FLUSH
10.0000 mL | Freq: Once | INTRAVENOUS | Status: DC
  Filled 2022-04-16: qty 10

## 2022-04-16 MED ORDER — HEPARIN SOD (PORK) LOCK FLUSH 100 UNIT/ML IV SOLN
500.0000 [IU] | Freq: Once | INTRAVENOUS | Status: DC
  Filled 2022-04-16: qty 5

## 2022-04-16 MED ORDER — HEPARIN SOD (PORK) LOCK FLUSH 100 UNIT/ML IV SOLN
500.0000 [IU] | Freq: Once | INTRAVENOUS | Status: AC
  Administered 2022-04-16: 500 [IU] via INTRAVENOUS
  Filled 2022-04-16: qty 5

## 2022-04-16 MED ORDER — SODIUM CHLORIDE 0.9% FLUSH
10.0000 mL | Freq: Once | INTRAVENOUS | Status: AC
  Administered 2022-04-16: 10 mL via INTRAVENOUS
  Filled 2022-04-16: qty 10

## 2022-04-16 NOTE — Progress Notes (Signed)
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  She quit smoking about 26 years ago.  Other medical problems include hypertension and hyponatremia.She has been treated for iron of possible pneumonia for the last 1 year.  She has a history of intermittent asthma for which she uses Advair.  She was seen by Dr. And underwent CT chest which showed dense infiltrate/solid mass in the right lower lobe with contiguous airspace opacity in the right upper lobe.  Findings could be secondary to pneumonia but concerning for bronchogenic carcinoma.  Patient then had a PET CT scan which showed consolidation in the medial aspect of the right lower lobe measuring 3.4 x 4 cm with an SUV of 11.6.  Hypermetabolic masslike thickening in the right hilum measuring 2.2 cm with intense hypermetabolic activity.  Groundglass airspace consolidation in the posterior aspect of the right upper lobe with an SUV of 11.4.  The right upper lobe opacity was nonspecific and differentials include pulmonary infection versus lymphangitic spread of carcinoma.  CT chest showed showed masslike area of distortion involving right lower lobe middle lobe as well as right upper lobe.  Generalized right hilar masslike appearance concerning for nodal involvement.   Patient underwent bronchoscopy and biopsies.Right lower lobe was concerning for adenocarcinoma.  Lymph node station 11 R, 4R, 7 were  negative for malignancy.  No malignant cells identified in the right upper lobe.  Case was discussed at tumor board and given the hypermetabolism in the right upper lobe as well as hilum involvement cannot be ruled out despite negative biopsies.  Patient completed concurrent chemoradiation with weekly CarboTaxol in July 2022.  Scan showed partial response.  Maintenance durvalumab completed in July 2023  Interval history-patient currently reports some nasal congestion.  She has exertional shortness of breath and occasional wheezing  ECOG PS- 1 Pain scale- 0   Review of systems- Review of Systems  Constitutional:  Positive for malaise/fatigue. Negative for chills, fever and weight loss.  HENT:  Positive for congestion. Negative for ear discharge and nosebleeds.   Eyes:  Negative for blurred vision.  Respiratory:  Positive for shortness of breath. Negative for cough, hemoptysis, sputum production and wheezing.   Cardiovascular:  Negative for chest pain, palpitations, orthopnea and claudication.  Gastrointestinal:  Negative for abdominal pain, blood in stool, constipation, diarrhea, heartburn, melena, nausea and vomiting.  Genitourinary:  Negative for dysuria, flank pain, frequency, hematuria and urgency.  Musculoskeletal:  Negative for back pain, joint pain and myalgias.  Skin:  Negative for rash.  Neurological:  Negative for dizziness, tingling, focal weakness, seizures, weakness and headaches.  Endo/Heme/Allergies:  Does not bruise/bleed easily.  Psychiatric/Behavioral:  Negative for depression and suicidal ideas. The patient does not have insomnia.       Allergies  Allergen Reactions   Ace Inhibitors Cough     Past Medical History:  Diagnosis Date   Anxiety    Asthma    Cancer ([place])    Cough  Dyspnea    with exertion   GERD (gastroesophageal reflux disease)    Hypertension    Lung cancer [facility])      Past Surgical History:  Procedure Laterality Date   BUNIONECTOMY  Bilateral    COLONOSCOPY     COLONOSCOPY WITH PROPOFOL N/A 07/30/2021   Procedure: COLONOSCOPY WITH PROPOFOL;  Surgeon: Annamaria Helling, DO;  Location: [facility] ENDOSCOPY;  Service: Gastroenterology;  Laterality: N/A;   ESOPHAGOGASTRODUODENOSCOPY  04/01/2006   PORTA CATH INSERTION N/A 12/22/2020   Procedure: PORTA CATH INSERTION;  Surgeon: Algernon Huxley, MD;  Location: [place] CV LAB;  Service: Cardiovascular;  Laterality: N/A;   VIDEO BRONCHOSCOPY WITH ENDOBRONCHIAL NAVIGATION N/A 11/07/2020   Procedure: VIDEO BRONCHOSCOPY WITH ENDOBRONCHIAL NAVIGATION;  Surgeon: Ottie Glazier, MD;  Location: [facility] ORS;  Service: Thoracic;  Laterality: N/A;   VIDEO BRONCHOSCOPY WITH ENDOBRONCHIAL ULTRASOUND N/A 11/07/2020   Procedure: VIDEO BRONCHOSCOPY WITH ENDOBRONCHIAL ULTRASOUND;  Surgeon: Ottie Glazier, MD;  Location: [facility] ORS;  Service: Thoracic;  Laterality: N/A;    Social History   Socioeconomic History   Marital status: Married    Spouse name: Not on file   Number of children: Not on file   Years of education: Not on file   Highest education level: Not on file  Occupational History   Not on file  Tobacco Use   Smoking status: Former    Packs/day: 1.50    Years: 26.80    Total pack years: 40.20    Types: Cigarettes    Quit date: 11/07/1994    Years since quitting: 27.4   Smokeless tobacco: Never  Vaping Use   Vaping Use: Never used  Substance and Sexual Activity   Alcohol use: Yes    Comment:  wine daily   Drug use: Never   Sexual activity: Yes  Other Topics Concern   Not on file  Social History Narrative   Not on file   Social Determinants of Health   Financial Resource Strain: Not on file  Food Insecurity: Not on file  Transportation Needs: Not on file  Physical Activity: Not on file  Stress: Not on file  Social Connections: Not on file  Intimate Partner Violence: Not on file    Family History  Problem Relation Age of Onset   Breast cancer Neg Hx       Current Outpatient Medications:    acetaminophen (TYLENOL) 500 MG tablet, Take 500 mg by mouth every 6 (six) hours as needed., Disp: , Rfl:    ALPRAZolam (XANAX) 0.5 MG tablet, Take 0.5 mg by mouth at bedtime as needed for sleep., Disp: , Rfl:    azelastine (ASTELIN) 0.1 % nasal spray, Place 2 sprays into both nostrils 2 (two) times daily. Use in each nostril as directed, Disp: 30 mL, Rfl: 2   budesonide-formoterol (SYMBICORT) 80-4.5 MCG/ACT inhaler, Inhale 2 puffs into the lungs 2 (two) times daily as needed., Disp: , Rfl:    Cholecalciferol 50 MCG (2000 UT) CAPS, Take 2,000 Units by mouth daily., Disp: , Rfl:    fluticasone (FLONASE) 50 MCG/ACT nasal spray, Place 2 sprays into both nostrils daily as needed for rhinitis., Disp: , Rfl:    folic acid (FOLVITE) 1 MG tablet, Take 1 mg by mouth daily., Disp: , Rfl:    levocetirizine (XYZAL) 5 MG tablet, Take 5 mg by mouth every evening., Disp: , Rfl:    metoprolol succinate (TOPROL-XL) 50 MG 24 hr tablet, Take 50 mg by mouth every morning., Disp: , Rfl:  montelukast (SINGULAIR) 10 MG tablet, Take 1 tablet by mouth at bedtime., Disp: , Rfl:    zolpidem (AMBIEN) 10 MG tablet, Take 10 mg by mouth at bedtime as needed for sleep., Disp: , Rfl:    esomeprazole (NEXIUM) 20 MG capsule, Take 20 mg by mouth as needed. (Patient not taking: Reported on 02/26/2022), Disp: , Rfl:  No current facility-administered medications for this visit.  Facility-Administered Medications Ordered in Other Visits:    heparin lock flush 100 UNIT/ML injection, , , ,    heparin lock flush 100 UNIT/ML injection, , , ,    heparin lock flush 100 UNIT/ML injection, , , ,    heparin lock flush 100 UNIT/ML injection, , , ,    sodium chloride flush (NS) 0.9 % injection 10 mL, 10 mL, Intravenous, Once, Sindy Guadeloupe, MD   sodium chloride flush (NS) 0.9 % injection 10 mL, 10 mL, Intravenous, PRN, Sindy Guadeloupe, MD, 10 mL at 01/26/21 0807  Physical exam:  Vitals:   04/16/22  0930  BP: (!) 140/85  Pulse: 92  Resp: 16  Temp: 97.7 F (36.5 C)  TempSrc: Oral  SpO2: 100%  Weight: 140 lb (63.5 kg)  Height: 5\' 5"  (1.651 m)   Physical Exam Constitutional:      General: She is not in acute distress. Cardiovascular:     Rate and Rhythm: Normal rate and regular rhythm.     Heart sounds: Normal heart sounds.  Pulmonary:     Effort: Pulmonary effort is normal.     Comments: Breath sounds decreased over right lung base.  No wheezing Abdominal:     General: Bowel sounds are normal.     Palpations: Abdomen is soft.  Skin:    General: Skin is warm and dry.  Neurological:     Mental Status: She is alert and oriented to person, place, and time.         Latest Ref Rng & Units 04/16/2022    9:05 AM  CMP  Glucose 70 - 99 mg/dL 116   BUN 8 - 23 mg/dL 8   Creatinine 0.44 - 1.00 mg/dL 0.69   Sodium 135 - 145 mmol/L 132   Potassium 3.5 - 5.1 mmol/L 4.2   Chloride 98 - 111 mmol/L 99   CO2 22 - 32 mmol/L 23   Calcium 8.9 - 10.3 mg/dL 9.1   Total Protein 6.5 - 8.1 g/dL 7.8   Total Bilirubin 0.3 - 1.2 mg/dL 0.8   Alkaline Phos 38 - 126 U/L 122   AST 15 - 41 U/L 24   ALT 0 - 44 U/L 18       Latest Ref Rng & Units 04/16/2022    9:05 AM  CBC  WBC 4.0 - 10.5 K/uL 5.3   Hemoglobin 12.0 - 15.0 g/dL 12.5   Hematocrit 36.0 - 46.0 % 36.2   Platelets 150 - 400 K/uL 283     No images are attached to the encounter.  US THORACENTESIS ASP PLEURAL SPACE W/IMG GUIDE  Result Date: 04/15/2022 INDICATION: Patient with history of right lung cancer and right-sided pleural effusion request received for diagnostic and therapeutic thoracentesis. EXAM: ULTRASOUND GUIDED RIGHT THORACENTESIS MEDICATIONS: Local 1% lidocaine only. COMPLICATIONS: None immediate. PROCEDURE: An ultrasound guided thoracentesis was thoroughly discussed with the patient and questions answered. The benefits, risks, alternatives and complications were also discussed. The patient understands and wishes to  proceed with the procedure. Written consent was obtained. Ultrasound was performed to localize and  mark an adequate pocket of fluid in the right chest. The area was then prepped and draped in the normal sterile fashion. 1% Lidocaine was used for local anesthesia. Under ultrasound guidance a 19 gauge, 7-cm, Yueh catheter was introduced. Thoracentesis was performed. The catheter was removed and a dressing applied. FINDINGS: A total of approximately 1 L of clear yellow fluid was removed. Samples were sent to the laboratory as requested by the clinical team. IMPRESSION: Successful ultrasound guided right thoracentesis yielding 1 L of pleural fluid. This exam was performed by Tsosie Billing PA-C, and was supervised and interpreted by Dr. Anselm Pancoast. Electronically Signed   By: Markus Daft M.D.   On: 04/15/2022 14:09   NM PET Image Restag (PS) Skull Base To Thigh  Result Date: 04/14/2022 CLINICAL DATA:  Subsequent treatment strategy for right lung cancer. EXAM: NUCLEAR MEDICINE PET SKULL BASE TO THIGH TECHNIQUE: 7.7 mCi F-18 FDG was injected intravenously. Full-ring PET imaging was performed from the skull base to thigh after the radiotracer. CT data was obtained and used for attenuation correction and anatomic localization. Fasting blood glucose: 105 mg/dl COMPARISON:  CT chest abdomen and pelvis 01/08/2022 FINDINGS: Mediastinal blood pool activity: SUV max 1.76. Liver activity: SUV max NA NECK: No hypermetabolic lymph nodes in the neck. Incidental CT findings: Air-fluid levels noted within the maxillary sinuses. CHEST: Again seen is a large area of masslike architectural distortion and fibrosis which is centered around the perihilar right lung with extension into the right middle lobe, posterior basal right upper lobe and posterior right lower lobe. This area measures approximately 7.0 x 4.9 cm and is compatible with changes due to external beam radiation image 96/2. There is low level FDG uptake corresponding to the  masslike architectural distortion with SUV max between 2.15 and 2.58. Within the posteromedial aspect of this area there is a small focus increased uptake which abuts the left atrium with SUV max of 3.39, image 100/2. Moderate to large right pleural effusion is identified which when compared with 09/30/2021 is increased in volume. No tracer avid pleural nodules identified bilaterally. No suspicious parenchymal nodules identified bilaterally. No adenopathy identified within the mediastinum. The previously noted right paratracheal lymph nodes (which measure up to 8 mm short axis on today's study) are again noted and exhibit mild uptake with SUV max of 1.93, image 80/2. Incidental CT findings: Aortic atherosclerosis, cardiac enlargement and coronary artery calcifications. No pericardial effusion. ABDOMEN/PELVIS: No abnormal hypermetabolic activity within the liver, pancreas, adrenal glands, or spleen. No hypermetabolic lymph nodes in the abdomen or pelvis. Incidental CT findings: Aortic atherosclerosis. SKELETON: No focal hypermetabolic activity to suggest skeletal metastasis. Incidental CT findings: Mild changes of AVN noted within both femoral heads. IMPRESSION: 1. No highly specific findings identified to suggest tracer avid recurrent tumor or metastatic disease. 2. Within the posteromedial aspect of the area of radiation change within the perihilar right lung there is a small focus of mildly increased radiotracer uptake (SUV max 3.39), which is above the background activity within the area of radiation change (SUV max 2.58). This is an equivocal finding and may represent non uniform postinflammatory uptake secondary to radiation change. Recurrent tumor would be difficult to exclude with a high degree of certainty and close interval follow-up is recommended in light of the persistent and progressive right pleural effusion. 3. Interval reaccumulation of the right pleural effusion status post thoracentesis. Now  moderate to large in volume and greater than that seen on 09/30/2021. 4. No signs of tracer avid nodal  metastasis or distant metastatic disease. 5. Aortic Atherosclerosis (ICD10-I70.0). Coronary artery calcifications. Electronically Signed   By: Kerby Moors M.D.   On: 04/14/2022 10:43     Assessment and plan- Patient is a 66 y.o. 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  She is s/p concurrent CarboTaxol chemotherapy followed by 1 year of maintenance durvalumab and here to discuss PET CT scan results and further management  I have reviewed PET/CT images independently and discussed findings with the patient.  She continues to have low level of hypermetabolism in the entire right upper lobe middle lobe as well as posterior lower lobe which measures about 7 cm and an SUV uptake of 2.1.  Superimposed radiation changes.  No hilar or mediastinal adenopathy.  No convincing evidence of disease recurrence.  However she continues to have right pleural effusion.  This was drained once in the past and cytology was negative for malignancy.  Infectious work-up on the fluid was also negative.  She has now reaccumulated this fluid and we drained it yesterday.  Cytology is pending.  I will see if I can add on infectious work-up to her pleural fluid as well again.  I will reach out to Dr. Lanney Gins and see if he can follow-up with her to decide the etiology and further management of her recurrent right pleural effusion.  I will give her a call back if cytology results are concerning.  Otherwise I will see her back in 3 months with port labs CBC with differential CMP CT chest abdomen and pelvis with contrast   Visit Diagnosis 1. Encounter for follow-up surveillance of lung cancer      Dr. Randa Evens, MD, MPH [facility] [phone] 04/16/2022 10:15 AM

## 2022-04-16 NOTE — Addendum Note (Signed)
Addended by: Luella Cook on: 04/16/2022 10:27 AM   Modules accepted: Orders

## 2022-04-19 LAB — CYTOLOGY - NON PAP

## 2022-04-26 ENCOUNTER — Encounter: Payer: Self-pay | Admitting: Radiation Oncology

## 2022-04-26 ENCOUNTER — Ambulatory Visit
Admission: RE | Admit: 2022-04-26 | Discharge: 2022-04-26 | Disposition: A | Discharge status: Home / Self Care | Payer: BC Managed Care – PPO | Source: Ambulatory Visit | Attending: Radiation Oncology | Admitting: Radiation Oncology

## 2022-04-26 VITALS — BP 157/80 | HR 90 | Temp 97.6°F | Resp 20 | Wt 142.7 lb

## 2022-04-26 DIAGNOSIS — Z9221 Personal history of antineoplastic chemotherapy: Secondary | ICD-10-CM | POA: Diagnosis not present

## 2022-04-26 DIAGNOSIS — Z87891 Personal history of nicotine dependence: Secondary | ICD-10-CM | POA: Insufficient documentation

## 2022-04-26 DIAGNOSIS — J90 Pleural effusion, not elsewhere classified: Secondary | ICD-10-CM | POA: Diagnosis not present

## 2022-04-26 DIAGNOSIS — Z923 Personal history of irradiation: Secondary | ICD-10-CM | POA: Insufficient documentation

## 2022-04-26 DIAGNOSIS — Z85118 Personal history of other malignant neoplasm of bronchus and lung: Secondary | ICD-10-CM | POA: Insufficient documentation

## 2022-04-26 DIAGNOSIS — C3490 Malignant neoplasm of unspecified part of unspecified bronchus or lung: Secondary | ICD-10-CM

## 2022-04-26 NOTE — Progress Notes (Signed)
Radiation Oncology Follow up Note  Name: Katherine Perry   Date:   04/26/2022 MRN:  [id number] DOB: [birth year]    This 66 y.o. female presents to the clinic today for 28-month follow-up status post concurrent chemoradiation for stage IIIa (cT3 N1 M0) adenocarcinoma of the right lung.  REFERRING PROVIDER: Rusty Aus, MD  HPI: Patient is a 66 year old female now seen at 14 months having completed concurrent chemoradiation therapy for stage IIIa adenocarcinoma the right lung she also completed 1 year of maintenance durvalumab.  She is seen today in routine follow-up she is doing well she has some slight allergies no specific cough hemoptysis or chest tightness.  PET CT scan.  This may shows no evidence of suggest any recurrent tumor or progression of disease.  She does have within the posterior medial aspect of radiation change in the perihilar right lung small focus of mildly increased radio tracer uptake.  She is also has recurrent pleural effusions was which have been tapped and always showed negative cytology.  COMPLICATIONS OF TREATMENT: none  FOLLOW UP COMPLIANCE: keeps appointments   PHYSICAL EXAM:  BP (!) 157/80 (BP Location: Right Arm, Patient Position: Sitting, Cuff Size: Normal)   Pulse 90   Temp 97.6 F (36.4 C) (Tympanic)   Resp 20   Wt 142 lb 11.2 oz (64.7 kg)   BMI 23.75 kg/m  Well-developed well-nourished patient in NAD. HEENT reveals PERLA, EOMI, discs not visualized.  Oral cavity is clear. No oral mucosal lesions are identified. Neck is clear without evidence of cervical or supraclavicular adenopathy. Lungs are clear to A&P. Cardiac examination is essentially unremarkable with regular rate and rhythm without murmur rub or thrill. Abdomen is benign with no organomegaly or masses noted. Motor sensory and DTR levels are equal and symmetric in the upper and lower extremities. Cranial nerves II through XII are grossly intact. Proprioception is intact. No peripheral adenopathy or  edema is identified. No motor or sensory levels are noted. Crude visual fields are within normal range.  RADIOLOGY RESULTS: PET CT scan reviewed compatible with above-stated findings  PLAN: Present time patient is doing well with no evidence of disease based on a recent PET CT scan.  Does have this recurrent pleural effusion which has been tapped twice and is negative for malignant cytology.  She has been closely monitored by medical oncology.  I have asked to see her back in 1 year for follow-up.  Patient knows to call with any concerns.  I would like to take this opportunity to thank you for allowing me to participate in the care of your patient.Noreene Filbert, MD

## 2022-05-11 ENCOUNTER — Encounter: Payer: Self-pay | Admitting: Oncology

## 2022-05-11 ENCOUNTER — Other Ambulatory Visit (HOSPITAL_BASED_OUTPATIENT_CLINIC_OR_DEPARTMENT_OTHER): Payer: Self-pay

## 2022-05-11 MED ORDER — FLUARIX QUADRIVALENT 0.5 ML IM SUSY
PREFILLED_SYRINGE | INTRAMUSCULAR | 0 refills | Status: DC
  Filled 2022-05-11: qty 0.5, 1d supply, fill #0

## 2022-05-14 ENCOUNTER — Encounter: Payer: Self-pay | Admitting: Oncology

## 2022-05-14 ENCOUNTER — Other Ambulatory Visit: Payer: Self-pay | Admitting: *Deleted

## 2022-05-14 DIAGNOSIS — C3491 Malignant neoplasm of unspecified part of right bronchus or lung: Secondary | ICD-10-CM

## 2022-05-14 DIAGNOSIS — Z95828 Presence of other vascular implants and grafts: Secondary | ICD-10-CM

## 2022-05-18 ENCOUNTER — Telehealth (INDEPENDENT_AMBULATORY_CARE_PROVIDER_SITE_OTHER): Payer: Self-pay

## 2022-05-18 ENCOUNTER — Other Ambulatory Visit: Payer: Self-pay | Admitting: Oncology

## 2022-05-18 ENCOUNTER — Encounter: Payer: Self-pay | Admitting: Oncology

## 2022-05-18 DIAGNOSIS — E538 Deficiency of other specified B group vitamins: Secondary | ICD-10-CM

## 2022-05-18 NOTE — Telephone Encounter (Signed)
I contacted the patient to schedule a port removal with Dr. Lucky Cowboy. A message was left for a a return call.

## 2022-05-21 ENCOUNTER — Other Ambulatory Visit: Payer: Self-pay | Admitting: Pulmonary Disease

## 2022-05-21 ENCOUNTER — Ambulatory Visit
Admission: RE | Admit: 2022-05-21 | Discharge: 2022-05-21 | Disposition: A | Discharge status: Home / Self Care | Payer: BC Managed Care – PPO | Source: Ambulatory Visit | Attending: Pulmonary Disease | Admitting: Pulmonary Disease

## 2022-05-21 ENCOUNTER — Ambulatory Visit
Admission: RE | Admit: 2022-05-21 | Discharge: 2022-05-21 | Disposition: A | Discharge status: Home / Self Care | Payer: BC Managed Care – PPO | Source: Ambulatory Visit | Attending: Student | Admitting: Student

## 2022-05-21 ENCOUNTER — Other Ambulatory Visit: Payer: Self-pay | Admitting: Student

## 2022-05-21 DIAGNOSIS — J90 Pleural effusion, not elsewhere classified: Secondary | ICD-10-CM

## 2022-05-21 DIAGNOSIS — Y844 Aspiration of fluid as the cause of abnormal reaction of the patient, or of later complication, without mention of misadventure at the time of the procedure: Secondary | ICD-10-CM | POA: Diagnosis not present

## 2022-05-21 DIAGNOSIS — Z9889 Other specified postprocedural states: Secondary | ICD-10-CM

## 2022-05-21 DIAGNOSIS — J95811 Postprocedural pneumothorax: Secondary | ICD-10-CM | POA: Diagnosis not present

## 2022-05-21 DIAGNOSIS — Z9221 Personal history of antineoplastic chemotherapy: Secondary | ICD-10-CM | POA: Insufficient documentation

## 2022-05-21 MED ORDER — LIDOCAINE HCL (PF) 1 % IJ SOLN
8.0000 mL | Freq: Once | INTRAMUSCULAR | Status: AC
  Administered 2022-05-21: 8 mL via INTRADERMAL

## 2022-05-21 NOTE — Progress Notes (Addendum)
Patient underwent right-sided thoracentesis today which removed 1,250 mL of fluid. Follow-up chest x-ray revealed the presence of a small, right apical pneumothorax following the procedure. This finding was discussed with Dr Laurence Ferrari. It was felt that this finding likely represented a pneumothorax ex vacuo.   The patient was examined following results of CXR. Her O2 saturation was 100%, and the patient stated she was feeling well and that she did not feel short of breath. The patient did not have tachycardia nor tachypnea at the time of exam. Given the patient's stable condition and her expressed desire to go home, she was discharged from her appointment.   Return precautions were outlined for the patient. She was instructed to report to the ED or to call 911 if she felt short of breath, had difficulty breathing or chest tightness, chest pain, light-headedness, or dizziness. The patient voiced her understanding of these return precautions.  Lura Em, PA-C 3:35 PM, 05/21/2022

## 2022-05-21 NOTE — H&P (View-Only) (Signed)
Patient underwent right-sided thoracentesis today which removed 1,250 mL of fluid. Follow-up chest x-ray revealed the presence of a small, right apical pneumothorax following the procedure. This finding was discussed with Dr Laurence Ferrari. It was felt that this finding likely represented a pneumothorax ex vacuo.   The patient was examined following results of CXR. Her O2 saturation was 100%, and the patient stated she was feeling well and that she did not feel short of breath. The patient did not have tachycardia nor tachypnea at the time of exam. Given the patient's stable condition and her expressed desire to go home, she was discharged from her appointment.   Return precautions were outlined for the patient. She was instructed to report to the ED or to call 911 if she felt short of breath, had difficulty breathing or chest tightness, chest pain, light-headedness, or dizziness. The patient voiced her understanding of these return precautions.  Lura Em, PA-C 3:35 PM, 05/21/2022

## 2022-05-21 NOTE — Procedures (Signed)
PROCEDURE SUMMARY:  Successful image-guided right thoracentesis. Yielded 1250 mL of yellow fluid. Pt tolerated procedure well. No immediate complications. EBL = trace   Specimen was sent for labs. CXR ordered.  Please see imaging section of Epic for full dictation.  Lura Em PA-C 05/21/2022 2:54 PM

## 2022-05-25 ENCOUNTER — Telehealth (INDEPENDENT_AMBULATORY_CARE_PROVIDER_SITE_OTHER): Payer: Self-pay

## 2022-05-25 NOTE — Telephone Encounter (Addendum)
I attempted to contact the patient to schedule a port removal with Dr. Lucky Cowboy. A message was left for a return call. Patient called back and is now scheduled on 05/31/22 with a 11:00 am arrival time to the MM. Pre-procedure instructions were discussed and will be mailed.

## 2022-05-26 LAB — CYTOLOGY - NON PAP

## 2022-05-27 ENCOUNTER — Telehealth (INDEPENDENT_AMBULATORY_CARE_PROVIDER_SITE_OTHER): Payer: Self-pay

## 2022-05-27 NOTE — Telephone Encounter (Signed)
I returned a call to the patient and a message was left for a return call.

## 2022-05-31 ENCOUNTER — Encounter (INDEPENDENT_AMBULATORY_CARE_PROVIDER_SITE_OTHER): Payer: Self-pay

## 2022-05-31 ENCOUNTER — Encounter
Admission: RE | Disposition: A | Discharge status: Home / Self Care | Payer: Self-pay | Source: Ambulatory Visit | Attending: Vascular Surgery

## 2022-05-31 ENCOUNTER — Encounter: Payer: Self-pay | Admitting: Vascular Surgery

## 2022-05-31 ENCOUNTER — Other Ambulatory Visit: Payer: Self-pay

## 2022-05-31 ENCOUNTER — Ambulatory Visit
Admission: RE | Admit: 2022-05-31 | Discharge: 2022-05-31 | Disposition: A | Discharge status: Home / Self Care | Payer: BC Managed Care – PPO | Source: Ambulatory Visit | Attending: Vascular Surgery | Admitting: Vascular Surgery

## 2022-05-31 DIAGNOSIS — C3490 Malignant neoplasm of unspecified part of unspecified bronchus or lung: Secondary | ICD-10-CM | POA: Insufficient documentation

## 2022-05-31 DIAGNOSIS — Z452 Encounter for adjustment and management of vascular access device: Secondary | ICD-10-CM | POA: Diagnosis present

## 2022-05-31 HISTORY — PX: PORTA CATH REMOVAL: CATH118286

## 2022-05-31 SURGERY — PORTA CATH REMOVAL
Anesthesia: Moderate Sedation

## 2022-05-31 MED ORDER — FAMOTIDINE 20 MG PO TABS: 40.0000 mg | ORAL_TABLET | Freq: Once | ORAL | Status: DC | PRN

## 2022-05-31 MED ORDER — SODIUM CHLORIDE 0.9 % IV SOLN: INTRAVENOUS | Status: DC

## 2022-05-31 MED ORDER — MIDAZOLAM HCL 2 MG/2ML IJ SOLN
INTRAMUSCULAR | Status: AC
  Filled 2022-05-31: qty 2

## 2022-05-31 MED ORDER — CEFAZOLIN SODIUM-DEXTROSE 2-4 GM/100ML-% IV SOLN
INTRAVENOUS | Status: AC
  Administered 2022-05-31: 2 g via INTRAVENOUS
  Filled 2022-05-31: qty 100

## 2022-05-31 MED ORDER — CEFAZOLIN SODIUM-DEXTROSE 2-4 GM/100ML-% IV SOLN: 2.0000 g | INTRAVENOUS | Status: AC

## 2022-05-31 MED ORDER — ONDANSETRON HCL 4 MG/2ML IJ SOLN: 4.0000 mg | Freq: Four times a day (QID) | INTRAMUSCULAR | Status: DC | PRN

## 2022-05-31 MED ORDER — HYDROMORPHONE HCL 1 MG/ML IJ SOLN: 1.0000 mg | Freq: Once | INTRAMUSCULAR | Status: DC | PRN

## 2022-05-31 MED ORDER — MIDAZOLAM HCL 2 MG/ML PO SYRP: 8.0000 mg | ORAL_SOLUTION | Freq: Once | ORAL | Status: DC | PRN

## 2022-05-31 MED ORDER — MIDAZOLAM HCL 2 MG/2ML IJ SOLN
INTRAMUSCULAR | Status: DC | PRN
  Administered 2022-05-31: 2 mg via INTRAVENOUS

## 2022-05-31 MED ORDER — METHYLPREDNISOLONE SODIUM SUCC 125 MG IJ SOLR: 125.0000 mg | Freq: Once | INTRAMUSCULAR | Status: DC | PRN

## 2022-05-31 MED ORDER — DIPHENHYDRAMINE HCL 50 MG/ML IJ SOLN: 50.0000 mg | Freq: Once | INTRAMUSCULAR | Status: DC | PRN

## 2022-05-31 MED ORDER — FENTANYL CITRATE (PF) 100 MCG/2ML IJ SOLN
INTRAMUSCULAR | Status: DC | PRN
  Administered 2022-05-31: 50 ug via INTRAVENOUS

## 2022-05-31 MED ORDER — FENTANYL CITRATE PF 50 MCG/ML IJ SOSY
PREFILLED_SYRINGE | INTRAMUSCULAR | Status: AC
  Filled 2022-05-31: qty 1

## 2022-05-31 SURGICAL SUPPLY — 6 items
DERMABOND ADVANCED .7 DNX12 (GAUZE/BANDAGES/DRESSINGS) IMPLANT
PACK ANGIOGRAPHY (CUSTOM PROCEDURE TRAY) IMPLANT
PENCIL ELECTRO HAND CTR (MISCELLANEOUS) IMPLANT
SUT MNCRL AB 4-0 PS2 18 (SUTURE) IMPLANT
SUT VIC AB 3-0 SH 27 (SUTURE) ×1
SUT VIC AB 3-0 SH 27X BRD (SUTURE) IMPLANT

## 2022-05-31 NOTE — Op Note (Signed)
[facility]       Operative Note  Date: 05/31/2022  Preoperative diagnosis:  1. Lung cancer no longer using port  Postoperative diagnosis:  Same as above  Procedures: #1. Removal of right jugular port a cath   Surgeon: Leotis Pain, MD  Anesthesia: Local with moderate conscious sedation for 16 minutes using 2 mg of Versed and 50 mcg of Fentanyl  Fluoroscopy time: none  Contrast used: 0  Estimated blood loss: Minimal  Indication for the procedure:  The patient is a 66 y.o. female who has completed her therapy for lung cancer and no longer needs their Port-A-Cath. The patient desires to have this removed. Risks and benefits including need for potential replacement with recurrent disease were discussed and patient is agreeable to proceed.  Description of procedure: The patient was brought to the vascular and interventional radiology suite. Moderate conscious sedation was administered during a face to face encounter with the patient throughout the procedure with my supervision of the RN administering medicines and monitoring the patient's vital signs, pulse oximetry, telemetry and mental status throughout from the start of the procedure until the patient was taken to the recovery room.  The right neck chest and shoulder were sterilely prepped and draped, and a sterile surgical field was created. The area was then anesthetized with 1% lidocaine copiously. The previous incision was reopened and electrocautery used to dissected down to the port and the catheter. These were dissected free and the catheter was gently removed from the vein in its entirety. The port was dissected out from the fibrous connective tissue and the Prolene sutures were removed. The port was then removed in its entirety including the catheter. The wound was then closed with a 3-0 Vicryl and a 4-0 Monocryl and Dermabond was placed as a dressing. The patient was then  taken to the recovery room in stable condition having tolerated the procedure well.  Complications: none  Condition: stable   Leotis Pain, MD 05/31/2022 4:45 PM   This note was created with Dragon Medical transcription system. Any errors in dictation are purely unintentional.

## 2022-05-31 NOTE — Interval H&P Note (Signed)
History and Physical Interval Note:  05/31/2022 10:53 AM  Katherine Perry  has presented today for surgery, with the diagnosis of Porta Cath Removal   Lung Ca.  The various methods of treatment have been discussed with the patient and family. After consideration of risks, benefits and other options for treatment, the patient has consented to  Procedure(s): PORTA CATH REMOVAL (N/A) as a surgical intervention.  The patient's history has been reviewed, patient examined, no change in status, stable for surgery.  I have reviewed the patient's chart and labs.  Questions were answered to the patient's satisfaction.     Leotis Pain

## 2022-06-01 ENCOUNTER — Encounter: Payer: Self-pay | Admitting: Vascular Surgery

## 2022-06-04 NOTE — H&P (Signed)
[facility] Admission History & Physical  MRN : [id number]  Katherine Perry is a 66 y.o. (20-Mar-1956) female who presents with chief complaint of No chief complaint on file. Marland Kitchen  History of Present Illness: patient presents for removal of her port.  She has completed her therapy for her lung cancer and desires to have the port removed  No current facility-administered medications for this encounter.   Current Outpatient Medications  Medication Sig Dispense Refill   acetaminophen (TYLENOL) 500 MG tablet Take 500 mg by mouth every 6 (six) hours as needed.     ALPRAZolam (XANAX) 0.5 MG tablet Take 0.5 mg by mouth at bedtime as needed for sleep.     budesonide-formoterol (SYMBICORT) 80-4.5 MCG/ACT inhaler Inhale 2 puffs into the lungs 2 (two) times daily as needed.     Cholecalciferol 50 MCG (2000 UT) CAPS Take 2,000 Units by mouth daily.     esomeprazole (NEXIUM) 20 MG capsule Take 20 mg by mouth as needed.     folic acid (FOLVITE) 1 MG tablet TAKE 1 TABLET BY MOUTH EVERY DAY 90 tablet 3   influenza vac split quadrivalent PF (FLUARIX QUADRIVALENT) 0.5 ML injection Inject into the muscle. 0.5 mL 0   metoprolol succinate (TOPROL-XL) 50 MG 24 hr tablet Take 50 mg by mouth every morning.     montelukast (SINGULAIR) 10 MG tablet Take 1 tablet by mouth at bedtime.     zolpidem (AMBIEN) 10 MG tablet Take 10 mg by mouth at bedtime as needed for sleep.     azelastine (ASTELIN) 0.1 % nasal spray Place 2 sprays into both nostrils 2 (two) times daily. Use in each nostril as directed 30 mL 2   fluticasone (FLONASE) 50 MCG/ACT nasal spray Place 2 sprays into both nostrils daily as needed for rhinitis.     levocetirizine (XYZAL) 5 MG tablet Take 5 mg by mouth every evening. (Patient not taking: Reported on 05/31/2022)     Facility-Administered Medications Ordered in Other Encounters  Medication Dose Route Frequency Provider Last Rate Last Admin   heparin lock flush 100 UNIT/ML  injection            heparin lock flush 100 UNIT/ML injection            heparin lock flush 100 UNIT/ML injection            heparin lock flush 100 UNIT/ML injection            sodium chloride flush (NS) 0.9 % injection 10 mL  10 mL Intravenous Once Sindy Guadeloupe, MD       sodium chloride flush (NS) 0.9 % injection 10 mL  10 mL Intravenous PRN Sindy Guadeloupe, MD   10 mL at 01/26/21 2595    Past Medical History:  Diagnosis Date   Anxiety    Asthma    Cancer ([place])    Cough    Dyspnea    with exertion   GERD (gastroesophageal reflux disease)    Hypertension    Lung cancer [facility])     Past Surgical History:  Procedure Laterality Date   BUNIONECTOMY Bilateral    COLONOSCOPY     COLONOSCOPY WITH PROPOFOL N/A 07/30/2021   Procedure: COLONOSCOPY WITH PROPOFOL;  Surgeon: Annamaria Helling, DO;  Location: [facility] ENDOSCOPY;  Service: Gastroenterology;  Laterality: N/A;   ESOPHAGOGASTRODUODENOSCOPY  04/01/2006   PORTA CATH INSERTION N/A 12/22/2020   Procedure: PORTA CATH INSERTION;  Surgeon: Algernon Huxley, MD;  Location: [place] [facility];  Service: Cardiovascular;  Laterality: N/A;   PORTA CATH REMOVAL N/A 05/31/2022   Procedure: PORTA CATH REMOVAL;  Surgeon: Algernon Huxley, MD;  Location: [place] [facility];  Service: Cardiovascular;  Laterality: N/A;   VIDEO BRONCHOSCOPY WITH ENDOBRONCHIAL NAVIGATION N/A 11/07/2020   Procedure: VIDEO BRONCHOSCOPY WITH ENDOBRONCHIAL NAVIGATION;  Surgeon: Ottie Glazier, MD;  Location: [facility] ORS;  Service: Thoracic;  Laterality: N/A;   VIDEO BRONCHOSCOPY WITH ENDOBRONCHIAL ULTRASOUND N/A 11/07/2020   Procedure: VIDEO BRONCHOSCOPY WITH ENDOBRONCHIAL ULTRASOUND;  Surgeon: Ottie Glazier, MD;  Location: [facility] ORS;  Service: Thoracic;  Laterality: N/A;     Social History   Tobacco Use   Smoking status: Former    Packs/day: 1.50    Years: 26.80    Total pack years: 40.20    Types: Cigarettes    Quit date: 11/07/1994    Years since quitting: 27.5    Smokeless tobacco: Never  Vaping Use   Vaping Use: Never used  Substance Use Topics   Alcohol use: Yes    Comment:  wine daily   Drug use: Never     Family History  Problem Relation Age of Onset   Breast cancer Neg Hx     Allergies  Allergen Reactions   Ace Inhibitors Cough     REVIEW OF SYSTEMS (Negative unless checked)  Constitutional: [] Weight loss  [] Fever  [] Chills Cardiac: [] Chest pain   [] Chest pressure   [x] Palpitations   [] Shortness of breath when laying flat   [] Shortness of breath at rest   [x] Shortness of breath with exertion. Vascular:  [] Pain in legs with walking   [] Pain in legs at rest   [] Pain in legs when laying flat   [] Claudication   [] Pain in feet when walking  [] Pain in feet at rest  [] Pain in feet when laying flat   [] History of DVT   [] Phlebitis   [] Swelling in legs   [] Varicose veins   [] Non-healing ulcers Pulmonary:   [] Uses home oxygen   [] Productive cough   [] Hemoptysis   [] Wheeze  [] COPD   [] Asthma Neurologic:  [] Dizziness  [] Blackouts   [] Seizures   [] History of stroke   [] History of TIA  [] Aphasia   [] Temporary blindness   [] Dysphagia   [] Weakness or numbness in arms   [] Weakness or numbness in legs Musculoskeletal:  [x] Arthritis   [] Joint swelling   [] Joint pain   [] Low back pain Hematologic:  [] Easy bruising  [] Easy bleeding   [] Hypercoagulable state   [] Anemic  [] Hepatitis Gastrointestinal:  [] Blood in stool   [] Vomiting blood  [] Gastroesophageal reflux/heartburn   [] Difficulty swallowing. Genitourinary:  [] Chronic kidney disease   [] Difficult urination  [] Frequent urination  [] Burning with urination   [] Blood in urine Skin:  [] Rashes   [] Ulcers   [] Wounds Psychological:  [] History of anxiety   []  History of major depression.  Physical Examination  Vitals:   05/31/22 1654 05/31/22 1700 05/31/22 1715 05/31/22 1730  BP: 139/68 (!) 151/77 (!) 146/72 139/68  Pulse: (!) 113 (!) 115 (!) 107 (!) 107  Resp: 14 20 (!) 24 (!) 21  Temp:      TempSrc:       SpO2: 98% 98% 98% 98%  Weight:      Height:       Body mass index is 23.63 kg/m. Gen: WD/WN, NAD Head: [place]/AT, No temporalis wasting. Prominent temp pulse not noted. Ear/Nose/Throat: Hearing grossly intact, nares w/o erythema or drainage, oropharynx w/o Erythema/Exudate,  Eyes: Conjunctiva clear,  sclera non-icteric Neck: Trachea midline.  No JVD.  Pulmonary:  Good air movement, respirations not labored, no use of accessory muscles.  Cardiac: tachycardic Vascular:  Vessel Right Left  Radial Palpable Palpable               Musculoskeletal: M/S 5/5 throughout.  Extremities without ischemic changes.  No deformity or atrophy.  Neurologic: Sensation grossly intact in extremities.  Symmetrical.  Speech is fluent. Motor exam as listed above. Psychiatric: Judgment intact, Mood & affect appropriate for pt's clinical situation. Dermatologic: No rashes or ulcers noted.  No cellulitis or open wounds.      CBC Lab Results  Component Value Date   WBC 5.3 04/16/2022   HGB 12.5 04/16/2022   HCT 36.2 04/16/2022   MCV 99.5 04/16/2022   PLT 283 04/16/2022    BMET    Component Value Date/Time   NA 132 (L) 04/16/2022 0905   K 4.2 04/16/2022 0905   CL 99 04/16/2022 0905   CO2 23 04/16/2022 0905   GLUCOSE 116 (H) 04/16/2022 0905   BUN 8 04/16/2022 0905   CREATININE 0.69 04/16/2022 0905   CALCIUM 9.1 04/16/2022 0905   GFRNONAA >60 04/16/2022 0905   CrCl cannot be calculated (Patient's most recent lab result is older than the maximum 21 days allowed.).  COAG Lab Results  Component Value Date   INR 1.0 11/07/2020    Radiology PERIPHERAL VASCULAR CATHETERIZATION  Result Date: 05/31/2022 See surgical note for result.  US THORACENTESIS ASP PLEURAL SPACE W/IMG GUIDE  Result Date: 05/21/2022 INDICATION: Patient with history of right lung cancer and right-sided pleural effusion. Request received for diagnostic and therapeutic thoracentesis. EXAM: ULTRASOUND GUIDED RIGHT  THORACENTESIS MEDICATIONS: 8 cc 1% lidocaine COMPLICATIONS: SIR Level A - No therapy, no consequence. PROCEDURE: An ultrasound guided thoracentesis was thoroughly discussed with the patient and questions answered. The benefits, risks, alternatives and complications were also discussed. The patient understands and wishes to proceed with the procedure. Written consent was obtained. Ultrasound was performed to localize and mark an adequate pocket of fluid in the right chest. The area was then prepped and draped in the normal sterile fashion. 1% Lidocaine was used for local anesthesia. Under ultrasound guidance a 6 Fr Safe-T-Centesis catheter was introduced. Thoracentesis was performed. The catheter was removed and a dressing applied. FINDINGS: A total of approximately 1,250 mL of yellow fluid was removed. Samples were sent to the laboratory as requested by the clinical team. IMPRESSION: Successful ultrasound guided right thoracentesis yielding 1250 mL of pleural fluid. Follow-up chest x-ray revealed presence of small right apical pneumothorax. Read by: Reatha Armour, PA-C Electronically Signed   By: Jacqulynn Cadet M.D.   On: 05/21/2022 16:51   DG Chest Port 1 View  Result Date: 05/21/2022 CLINICAL DATA:  Right thoracentesis EXAM: PORTABLE CHEST 1 VIEW COMPARISON:  04/15/2022 FINDINGS: Right-sided chest port remains in place. Stable cardiomediastinal contours. Moderate-sized right pleural effusion. Small right apical pneumothorax (estimated 10-15% volume). Left lung is clear. IMPRESSION: 1. Small right apical pneumothorax following thoracentesis. 2. Moderate right pleural effusion. These results will be called to the ordering clinician or representative by the Radiologist Assistant, and communication documented in the PACS or [employer]. Electronically Signed   By: Davina Poke D.O.   On: 05/21/2022 15:08     Assessment/Plan 1.  Lung cancer, completed therapy and desires removal of her  port.    Leotis Pain, MD  06/04/2022 8:51 AM

## 2022-07-14 ENCOUNTER — Ambulatory Visit
Admission: RE | Admit: 2022-07-14 | Discharge: 2022-07-14 | Disposition: A | Discharge status: Home / Self Care | Payer: BC Managed Care – PPO | Source: Ambulatory Visit | Attending: Oncology | Admitting: Oncology

## 2022-07-14 DIAGNOSIS — C3491 Malignant neoplasm of unspecified part of right bronchus or lung: Secondary | ICD-10-CM | POA: Diagnosis not present

## 2022-07-14 MED ORDER — IOHEXOL 300 MG/ML  SOLN
100.0000 mL | Freq: Once | INTRAMUSCULAR | Status: AC | PRN
  Administered 2022-07-14: 100 mL via INTRAVENOUS

## 2022-07-19 ENCOUNTER — Observation Stay
Admission: RE | Admit: 2022-07-19 | Discharge: 2022-07-19 | Disposition: A | Discharge status: Home / Self Care | Payer: BC Managed Care – PPO | Source: Ambulatory Visit | Attending: Oncology | Admitting: Oncology

## 2022-07-19 ENCOUNTER — Inpatient Hospital Stay (HOSPITAL_BASED_OUTPATIENT_CLINIC_OR_DEPARTMENT_OTHER): Payer: BC Managed Care – PPO | Admitting: Oncology

## 2022-07-19 ENCOUNTER — Ambulatory Visit
Admission: RE | Admit: 2022-07-19 | Discharge: 2022-07-19 | Disposition: A | Discharge status: Home / Self Care | Payer: BC Managed Care – PPO | Source: Ambulatory Visit | Attending: Student | Admitting: Student

## 2022-07-19 ENCOUNTER — Encounter: Payer: Self-pay | Admitting: Oncology

## 2022-07-19 ENCOUNTER — Observation Stay
Admission: EM | Admit: 2022-07-19 | Discharge: 2022-07-21 | Disposition: A | Discharge status: Home / Self Care | Payer: BC Managed Care – PPO | Attending: Hospitalist | Admitting: Hospitalist

## 2022-07-19 ENCOUNTER — Inpatient Hospital Stay: Payer: BC Managed Care – PPO | Attending: Oncology

## 2022-07-19 ENCOUNTER — Emergency Department: Payer: BC Managed Care – PPO

## 2022-07-19 ENCOUNTER — Telehealth: Payer: Self-pay | Admitting: *Deleted

## 2022-07-19 ENCOUNTER — Other Ambulatory Visit: Payer: Self-pay | Admitting: Student

## 2022-07-19 VITALS — BP 153/83 | HR 98 | Temp 97.5°F | Ht 65.0 in | Wt 142.0 lb

## 2022-07-19 VITALS — BP 134/69 | HR 103

## 2022-07-19 DIAGNOSIS — J90 Pleural effusion, not elsewhere classified: Secondary | ICD-10-CM | POA: Insufficient documentation

## 2022-07-19 DIAGNOSIS — Z923 Personal history of irradiation: Secondary | ICD-10-CM | POA: Insufficient documentation

## 2022-07-19 DIAGNOSIS — J95811 Postprocedural pneumothorax: Principal | ICD-10-CM | POA: Insufficient documentation

## 2022-07-19 DIAGNOSIS — Z85118 Personal history of other malignant neoplasm of bronchus and lung: Secondary | ICD-10-CM

## 2022-07-19 DIAGNOSIS — F419 Anxiety disorder, unspecified: Secondary | ICD-10-CM | POA: Insufficient documentation

## 2022-07-19 DIAGNOSIS — Z08 Encounter for follow-up examination after completed treatment for malignant neoplasm: Secondary | ICD-10-CM

## 2022-07-19 DIAGNOSIS — J939 Pneumothorax, unspecified: Secondary | ICD-10-CM | POA: Diagnosis present

## 2022-07-19 DIAGNOSIS — Z79899 Other long term (current) drug therapy: Secondary | ICD-10-CM | POA: Diagnosis not present

## 2022-07-19 DIAGNOSIS — J45909 Unspecified asthma, uncomplicated: Secondary | ICD-10-CM | POA: Insufficient documentation

## 2022-07-19 DIAGNOSIS — C3491 Malignant neoplasm of unspecified part of right bronchus or lung: Secondary | ICD-10-CM

## 2022-07-19 DIAGNOSIS — J948 Other specified pleural conditions: Secondary | ICD-10-CM

## 2022-07-19 DIAGNOSIS — R000 Tachycardia, unspecified: Secondary | ICD-10-CM

## 2022-07-19 DIAGNOSIS — Z95828 Presence of other vascular implants and grafts: Secondary | ICD-10-CM

## 2022-07-19 DIAGNOSIS — Z87891 Personal history of nicotine dependence: Secondary | ICD-10-CM | POA: Insufficient documentation

## 2022-07-19 DIAGNOSIS — J449 Chronic obstructive pulmonary disease, unspecified: Secondary | ICD-10-CM | POA: Diagnosis not present

## 2022-07-19 DIAGNOSIS — Z7951 Long term (current) use of inhaled steroids: Secondary | ICD-10-CM | POA: Insufficient documentation

## 2022-07-19 DIAGNOSIS — K219 Gastro-esophageal reflux disease without esophagitis: Secondary | ICD-10-CM | POA: Insufficient documentation

## 2022-07-19 DIAGNOSIS — Z9221 Personal history of antineoplastic chemotherapy: Secondary | ICD-10-CM | POA: Insufficient documentation

## 2022-07-19 DIAGNOSIS — I10 Essential (primary) hypertension: Secondary | ICD-10-CM | POA: Insufficient documentation

## 2022-07-19 DIAGNOSIS — C3431 Malignant neoplasm of lower lobe, right bronchus or lung: Secondary | ICD-10-CM | POA: Insufficient documentation

## 2022-07-19 LAB — CBC WITH DIFFERENTIAL/PLATELET
Abs Immature Granulocytes: 0.01 10*3/uL (ref 0.00–0.07)
Abs Immature Granulocytes: 0.03 10*3/uL (ref 0.00–0.07)
Basophils Absolute: 0.1 10*3/uL (ref 0.0–0.1)
Basophils Absolute: 0.1 10*3/uL (ref 0.0–0.1)
Basophils Relative: 1 %
Basophils Relative: 1 %
Eosinophils Absolute: 0.1 10*3/uL (ref 0.0–0.5)
Eosinophils Absolute: 0.2 10*3/uL (ref 0.0–0.5)
Eosinophils Relative: 3 %
Eosinophils Relative: 3 %
HCT: 39.9 % (ref 36.0–46.0)
HCT: 41.9 % (ref 36.0–46.0)
Hemoglobin: 13.5 g/dL (ref 12.0–15.0)
Hemoglobin: 13.7 g/dL (ref 12.0–15.0)
Immature Granulocytes: 0 %
Immature Granulocytes: 1 %
Lymphocytes Relative: 21 %
Lymphocytes Relative: 17 %
Lymphs Abs: 0.9 10*3/uL (ref 0.7–4.0)
Lymphs Abs: 1 10*3/uL (ref 0.7–4.0)
MCH: 33.7 pg (ref 26.0–34.0)
MCH: 33.3 pg (ref 26.0–34.0)
MCHC: 33.8 g/dL (ref 30.0–36.0)
MCHC: 32.7 g/dL (ref 30.0–36.0)
MCV: 99.5 fL (ref 80.0–100.0)
MCV: 101.7 fL — ABNORMAL HIGH (ref 80.0–100.0)
Monocytes Absolute: 0.4 10*3/uL (ref 0.1–1.0)
Monocytes Absolute: 0.5 10*3/uL (ref 0.1–1.0)
Monocytes Relative: 9 %
Monocytes Relative: 8 %
Neutro Abs: 3 10*3/uL (ref 1.7–7.7)
Neutro Abs: 4.4 10*3/uL (ref 1.7–7.7)
Neutrophils Relative %: 66 %
Neutrophils Relative %: 70 %
Platelets: 292 10*3/uL (ref 150–400)
Platelets: 313 10*3/uL (ref 150–400)
RBC: 4.01 MIL/uL (ref 3.87–5.11)
RBC: 4.12 MIL/uL (ref 3.87–5.11)
RDW: 12.8 % (ref 11.5–15.5)
RDW: 12.9 % (ref 11.5–15.5)
WBC: 4.5 10*3/uL (ref 4.0–10.5)
WBC: 6.2 10*3/uL (ref 4.0–10.5)
nRBC: 0 % (ref 0.0–0.2)
nRBC: 0 % (ref 0.0–0.2)

## 2022-07-19 LAB — COMPREHENSIVE METABOLIC PANEL
ALT: 20 U/L (ref 0–44)
ALT: 21 U/L (ref 0–44)
AST: 30 U/L (ref 15–41)
AST: 30 U/L (ref 15–41)
Albumin: 4.4 g/dL (ref 3.5–5.0)
Albumin: 4.4 g/dL (ref 3.5–5.0)
Alkaline Phosphatase: 121 U/L (ref 38–126)
Alkaline Phosphatase: 118 U/L (ref 38–126)
Anion gap: 13 (ref 5–15)
Anion gap: 11 (ref 5–15)
BUN: 12 mg/dL (ref 8–23)
BUN: 19 mg/dL (ref 8–23)
CO2: 24 mmol/L (ref 22–32)
CO2: 25 mmol/L (ref 22–32)
Calcium: 9.5 mg/dL (ref 8.9–10.3)
Calcium: 10.1 mg/dL (ref 8.9–10.3)
Chloride: 98 mmol/L (ref 98–111)
Chloride: 101 mmol/L (ref 98–111)
Creatinine, Ser: 0.84 mg/dL (ref 0.44–1.00)
Creatinine, Ser: 0.84 mg/dL (ref 0.44–1.00)
GFR, Estimated: >60 mL/min (ref >60)
GFR, Estimated: >60 mL/min (ref >60)
Glucose, Bld: 88 mg/dL (ref 70–99)
Glucose, Bld: 98 mg/dL (ref 70–99)
Potassium: 4.2 mmol/L (ref 3.5–5.1)
Potassium: 4 mmol/L (ref 3.5–5.1)
Sodium: 135 mmol/L (ref 135–145)
Sodium: 137 mmol/L (ref 135–145)
Total Bilirubin: 0.5 mg/dL (ref 0.3–1.2)
Total Bilirubin: 0.7 mg/dL (ref 0.3–1.2)
Total Protein: 8.4 g/dL — ABNORMAL HIGH (ref 6.5–8.1)
Total Protein: 8.4 g/dL — ABNORMAL HIGH (ref 6.5–8.1)

## 2022-07-19 LAB — PROTEIN, PLEURAL OR PERITONEAL FLUID: Total protein, fluid: 5.5 g/dL

## 2022-07-19 LAB — GLUCOSE, PLEURAL OR PERITONEAL FLUID: Glucose, Fluid: 108 mg/dL

## 2022-07-19 LAB — LACTATE DEHYDROGENASE, PLEURAL OR PERITONEAL FLUID: LD, Fluid: 87 U/L — ABNORMAL HIGH (ref 3–23)

## 2022-07-19 MED ORDER — POLYETHYLENE GLYCOL 3350 17 G PO PACK: 17.0000 g | PACK | Freq: Every day | ORAL | Status: DC | PRN

## 2022-07-19 MED ORDER — SODIUM CHLORIDE 0.9% FLUSH
10.0000 mL | Freq: Once | INTRAVENOUS | Status: AC
  Filled 2022-07-19: qty 10

## 2022-07-19 MED ORDER — SODIUM CHLORIDE 0.9% FLUSH
3.0000 mL | Freq: Two times a day (BID) | INTRAVENOUS | Status: DC
  Administered 2022-07-20 (×3): 3 mL via INTRAVENOUS

## 2022-07-19 MED ORDER — ACETAMINOPHEN 650 MG RE SUPP: 650.0000 mg | Freq: Four times a day (QID) | RECTAL | Status: DC | PRN

## 2022-07-19 MED ORDER — ONDANSETRON HCL 4 MG PO TABS: 4.0000 mg | ORAL_TABLET | Freq: Four times a day (QID) | ORAL | Status: DC | PRN

## 2022-07-19 MED ORDER — ONDANSETRON HCL 4 MG/2ML IJ SOLN: 4.0000 mg | Freq: Four times a day (QID) | INTRAMUSCULAR | Status: DC | PRN

## 2022-07-19 MED ORDER — LIDOCAINE HCL (PF) 1 % IJ SOLN
10.0000 mL | Freq: Once | INTRAMUSCULAR | Status: AC
  Administered 2022-07-19: 10 mL via INTRADERMAL

## 2022-07-19 MED ORDER — ACETAMINOPHEN 325 MG PO TABS: 650.0000 mg | ORAL_TABLET | Freq: Four times a day (QID) | ORAL | Status: DC | PRN

## 2022-07-19 MED ORDER — HEPARIN SOD (PORK) LOCK FLUSH 100 UNIT/ML IV SOLN
500.0000 [IU] | Freq: Once | INTRAVENOUS | Status: AC
  Filled 2022-07-19: qty 5

## 2022-07-19 MED ORDER — IPRATROPIUM-ALBUTEROL 0.5-2.5 (3) MG/3ML IN SOLN
3.0000 mL | Freq: Once | RESPIRATORY_TRACT | Status: AC
  Administered 2022-07-20: 3 mL via RESPIRATORY_TRACT
  Filled 2022-07-19: qty 3

## 2022-07-19 MED ORDER — CEFAZOLIN SODIUM-DEXTROSE 2-4 GM/100ML-% IV SOLN
2.0000 g | INTRAVENOUS | Status: AC
  Filled 2022-07-19: qty 100

## 2022-07-19 NOTE — ED Provider Notes (Signed)
[facility] Provider Note   Event Date/Time   First MD Initiated Contact with Patient 07/19/22 1952     (approximate) History  Chest Injury  HPI Katherine Perry is a 66 y.o. 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  Patient states that she has already spoken with her physicians who have told her that interventional radiology will be placing a chest tube tomorrow morning. ROS: Patient currently denies any vision changes, tinnitus, difficulty speaking, facial droop, sore throat, chest pain, abdominal pain, nausea/vomiting/diarrhea, dysuria, or weakness/numbness/paresthesias in any extremity   Physical Exam  Triage Vital Signs: ED Triage Vitals  Enc Vitals Group     BP 07/19/22 1902 (!) 190/91     Pulse Rate 07/19/22 1902 (!) 114     Resp 07/19/22 1902 18     Temp 07/19/22 1902 98 F (36.7 C)     Temp Source 07/19/22 1902 Oral     SpO2 07/19/22 1902 100 %     Weight --      Height --      Head Circumference --      Peak Flow --      Pain Score 07/19/22 1904 0     Pain Loc --      Pain Edu? --      Excl. in [place]? --    Most recent vital signs: Vitals:   07/19/22 1902  BP: (!) 190/91  Pulse: (!) 114  Resp: 18  Temp: 98 F (36.7 C)  SpO2: 100%   General: Awake, oriented x4. CV:  Good peripheral perfusion.  Resp:  Normal effort.  Decreased breath sounds in the right lower lung fields Abd:  No distention.  Other:  Elderly Caucasian female laying in bed in no acute distress ED Results / Procedures / Treatments  Labs (all labs ordered are listed, but only abnormal results are displayed) Labs  Reviewed  COMPREHENSIVE METABOLIC PANEL - Abnormal; Notable for the following components:      Result Value   Total Protein 8.4 (*)    All other components within normal limits  CBC WITH DIFFERENTIAL/PLATELET - Abnormal; Notable for the following components:   MCV 101.7 (*)    All other components within normal limits  PROTIME-INR  RADIOLOGY ED MD interpretation: 2 view x-ray of the chest interpreted by me shows right-sided hydropneumothorax that is approximately 33% -Agree with radiology assessment Official radiology report(s): DG Chest 2 View  Result Date: 07/19/2022 CLINICAL DATA:  Pneumothorax R side. Timed to ensure no progression EXAM: CHEST - 2 VIEW COMPARISON:  July 19, 2022 FINDINGS: The cardiomediastinal silhouette is unchanged in contour.Revisualization of a RIGHT-sided hydropneumothorax. Air component appears mildly increased in comparison to priors. Revisualization of RIGHT-sided atelectasis. Visualized abdomen is unremarkable. Mild degenerative changes of the thoracic spine. IMPRESSION: Revisualization of a RIGHT-sided hydropneumothorax. Air component appears mildly increased in comparison to priors. Electronically Signed   By: Valentino Saxon M.D.   On: 07/19/2022 19:45   DG Chest Port 1 View  Result Date: 07/19/2022 CLINICAL DATA:  Follow-up pneumothorax EXAM: PORTABLE CHEST 1 VIEW COMPARISON:  07/19/2022, 05/21/2022 FINDINGS: Left lung grossly clear. Similar  residual pleural effusion and right lower lobe collapse. Increased moderate right pneumothorax with greater apical medial, apicolateral and right basilar component compared to the prior radiograph. IMPRESSION: Known right pneumothorax appears slightly increased in size as compared with radiograph at 4:23 p.m. no midline shift. Similar residual right pleural effusion and right lower lobe collapse. Electronically Signed   By: Donavan Foil M.D.   On: 07/19/2022 17:54   DG Chest Port 1 View  Result Date:  07/19/2022 CLINICAL DATA:  Follow-up pneumothorax status post right thoracentesis EXAM: PORTABLE CHEST 1 VIEW COMPARISON:  Multiple priors FINDINGS: Cardiomediastinal silhouette is within normal limits. Moderate right pleural effusion. Similar appearance of apical right pneumothorax. There is now a new subpleural component with a more pronounced air-fluid level and likely unchanged right lower lobe collapse. The left lung remains clear. Osseous structures are unremarkable. IMPRESSION: There is more pronounced appearance of subpleural component of right-sided pneumothorax with likely unchanged right lower lobe collapse. Findings likely represent pneumothorax ex vacuo given history of similar findings on prior chest radiographs following thoracentesis. Patient states that she feels similar to slightly improved following thoracentesis. Another chest radiograph will be obtained in 1 hour to ensure no further changes. Electronically Signed   By: Albin Felling M.D.   On: 07/19/2022 16:47   DG Chest Port 1 View  Result Date: 07/19/2022 CLINICAL DATA:  Status post thoracentesis EXAM: PORTABLE CHEST 1 VIEW COMPARISON:  05/21/2022 FINDINGS: Mild cardiomegaly. Moderate right pleural effusion and associated atelectasis or consolidation. Small right apical pneumothorax, approximately 15% in volume. Left lung is normally aerated. Osseous structures unremarkable. IMPRESSION: 1. Small right apical pneumothorax, approximately 15% in volume, similar in volume to pneumothorax seen following prior thoracentesis dated 05/21/2022. 2. Moderate right pleural effusion and associated atelectasis or consolidation. 3. Mild cardiomegaly. 4. No new airspace opacity. These results will be called to the ordering clinician or representative by the Radiologist Assistant, and communication documented in the PACS or [employer]. Electronically Signed   By: Delanna Ahmadi M.D.   On: 07/19/2022 15:35   PROCEDURES: Critical Care performed:  Yes, see critical care procedure note(s) .1-3 Lead EKG Interpretation  Performed by: Naaman Plummer, MD Authorized by: Naaman Plummer, MD     Interpretation: abnormal     ECG rate:  115   ECG rate assessment: tachycardic     Rhythm: sinus tachycardia     Ectopy: none     Conduction: normal   CRITICAL CARE Performed by: Naaman Plummer  Total critical care time: 13 minutes  Critical care time was exclusive of separately billable procedures and treating other patients.  Critical care was necessary to treat or prevent imminent or life-threatening deterioration.  Critical care was time spent personally by me on the following activities: development of treatment plan with patient and/or surrogate as well as nursing, discussions with consultants, evaluation of patient's response to treatment, examination of patient, obtaining history from patient or surrogate, ordering and performing treatments and interventions, ordering and review of laboratory studies, ordering and review of radiographic studies, pulse oximetry and re-evaluation of patient's condition.  MEDICATIONS ORDERED IN ED: Medications  ceFAZolin (ANCEF) IVPB 2g/100 mL premix (has no administration in time range)   IMPRESSION / MDM / ASSESSMENT AND PLAN / ED COURSE  I reviewed the triage vital signs and the nursing notes.  The patient is on the cardiac monitor to evaluate for evidence of arrhythmia and/or significant heart rate changes. Patient's presentation is most consistent with acute presentation with potential threat to life or bodily function. Patient is a 66 year old female that presents from the CT department after an iatrogenic pneumothorax during a thoracentesis just prior to arrival.  Patient already has plan for interventional radiology to place a chest tube tomorrow for evacuation.  Patient shows no signs of respiratory distress at this time.  Patient denies any shortness of breath or chest  pain.  Patient shows no red flag symptomatology for emergent chest tube placement.  Dispo: Admit to medicine   FINAL CLINICAL IMPRESSION(S) / ED DIAGNOSES   Final diagnoses:  Hydropneumothorax   Rx / DC Orders   ED Discharge Orders     None      Note:  This document was prepared using Dragon voice recognition software and may include unintentional dictation errors.   Naaman Plummer, MD 07/19/22 2018

## 2022-07-19 NOTE — ED Provider Triage Note (Signed)
Emergency Medicine Provider Triage Evaluation Note  ARAYAH KROUSE , a 66 y.o. female  was evaluated in triage.  Pt complains of shortness of breath. Brought from specials after thoracentesis that resulted in pneumothorax. Serial xrays reveal pneumo of approximately 33% on R side. IR requests admission with IR placing chest tube in AM.  Review of Systems  Positive: Pneumothorax secondary to procedure Negative: CP  Physical Exam  There were no vitals taken for this visit. Gen:   Awake, no distress   Resp:  Normal effort  MSK:   Moves extremities without difficulty  Other:    Medical Decision Making  Medically screening exam initiated at 6:14 PM.  Appropriate orders placed.  LACHELLE RISSLER was informed that the remainder of the evaluation will be completed by another provider, this initial triage assessment does not replace that evaluation, and the importance of remaining in the ED until their evaluation is complete.  Pneumo. IR aware and wish patient admitted with IR placement of chest tube in the AM. Labs at this time   Darletta Moll, PA-C 07/19/22 1818

## 2022-07-19 NOTE — H&P (Cosign Needed Addendum)
Chief Complaint: Patient was seen in consultation today for right pneumothorax   Referring Physician(s): Dr Michaelle Birks  Supervising Physician: Corrie Mckusick  Patient Status: [facility] - ED  History of Present Illness: Katherine Perry is a 66 y.o. female with PMH significant for anxiety, asthma, lung cancer, GERD, and hypertension. The patient presented to [facility] on 07/19/22 for an image-guided thoracentesis. Following the thoracentesis, chest x-ray revealed the presence of a pneumothorax. Serial chest x-rays revealed the pneumothorax was slightly increasing in size. Per discussion with Dr Maryelizabeth Kaufmann who was IR on-call physician, the patient required admission for treatment of the pneumothorax with an image-guided right thoracostomy and chest tube placement required for the morning of 07/20/22.   Past Medical History:  Diagnosis Date   Anxiety    Asthma    Cancer ([place])    Cough    Dyspnea    with exertion   GERD (gastroesophageal reflux disease)    Hypertension    Lung cancer [facility])     Past Surgical History:  Procedure Laterality Date   BUNIONECTOMY Bilateral    COLONOSCOPY     COLONOSCOPY WITH PROPOFOL N/A 07/30/2021   Procedure: COLONOSCOPY WITH PROPOFOL;  Surgeon: Annamaria Helling, DO;  Location: [facility] ENDOSCOPY;  Service: Gastroenterology;  Laterality: N/A;   ESOPHAGOGASTRODUODENOSCOPY  04/01/2006   PORTA CATH INSERTION N/A 12/22/2020   Procedure: PORTA CATH INSERTION;  Surgeon: Algernon Huxley, MD;  Location: [place] CV LAB;  Service: Cardiovascular;  Laterality: N/A;   PORTA CATH REMOVAL N/A 05/31/2022   Procedure: PORTA CATH REMOVAL;  Surgeon: Algernon Huxley, MD;  Location: [place] [facility];  Service: Cardiovascular;  Laterality: N/A;   VIDEO BRONCHOSCOPY WITH ENDOBRONCHIAL NAVIGATION N/A 11/07/2020   Procedure: VIDEO BRONCHOSCOPY WITH ENDOBRONCHIAL NAVIGATION;  Surgeon: Ottie Glazier, MD;  Location: [facility] ORS;  Service: Thoracic;  Laterality: N/A;   VIDEO  BRONCHOSCOPY WITH ENDOBRONCHIAL ULTRASOUND N/A 11/07/2020   Procedure: VIDEO BRONCHOSCOPY WITH ENDOBRONCHIAL ULTRASOUND;  Surgeon: Ottie Glazier, MD;  Location: [facility] ORS;  Service: Thoracic;  Laterality: N/A;    Allergies: Ace inhibitors  Medications: Prior to Admission medications   Medication Sig Start Date End Date Taking? Authorizing Provider  acetaminophen (TYLENOL) 500 MG tablet Take 500 mg by mouth every 6 (six) hours as needed.    [provider]  ALPRAZolam Duanne Moron) 0.5 MG tablet Take 0.5 mg by mouth at bedtime as needed for sleep. 10/18/20   [provider]  azelastine (ASTELIN) 0.1 % nasal spray Place 2 sprays into both nostrils 2 (two) times daily. Use in each nostril as directed 05/08/21   Verlon Au, NP  budesonide-formoterol (SYMBICORT) 80-4.5 MCG/ACT inhaler Inhale 2 puffs into the lungs 2 (two) times daily as needed.    [provider]  Cholecalciferol 50 MCG (2000 UT) CAPS Take 2,000 Units by mouth daily.    [provider]  esomeprazole (NEXIUM) 20 MG capsule Take 20 mg by mouth as needed.    [provider]  fluticasone (FLONASE) 50 MCG/ACT nasal spray Place 2 sprays into both nostrils daily as needed for rhinitis. 09/22/20   [provider]  folic acid (FOLVITE) 1 MG tablet TAKE 1 TABLET BY MOUTH EVERY DAY 05/18/22   Sindy Guadeloupe, MD  influenza vac split quadrivalent PF (FLUARIX QUADRIVALENT) 0.5 ML injection Inject into the muscle. 05/11/22   Carlyle Basques, MD  levocetirizine (XYZAL) 5 MG tablet Take 5 mg by mouth every evening. Patient not taking: Reported on 05/31/2022  [provider]  metoprolol succinate (TOPROL-XL) 50 MG 24 hr tablet Take 50 mg by mouth every morning. 10/16/20   [provider]  montelukast (SINGULAIR) 10 MG tablet Take 1 tablet by mouth at bedtime. 11/03/21 11/03/22  [provider]  zolpidem (AMBIEN) 10 MG tablet Take 10 mg by mouth at bedtime as needed for sleep.  10/16/20   [provider]     Family History  Problem Relation Age of Onset   Breast cancer Neg Hx     Social History   Socioeconomic History   Marital status: Married    Spouse name: Not on file   Number of children: Not on file   Years of education: Not on file   Highest education level: Not on file  Occupational History   Not on file  Tobacco Use   Smoking status: Former    Packs/day: 1.50    Years: 26.80    Total pack years: 40.20    Types: Cigarettes    Quit date: 11/07/1994    Years since quitting: 27.7   Smokeless tobacco: Never  Vaping Use   Vaping Use: Never used  Substance and Sexual Activity   Alcohol use: Yes    Comment:  wine daily   Drug use: Never   Sexual activity: Yes  Other Topics Concern   Not on file  Social History Narrative   Lives with husband, Thayer Jew, no pets.   Social Determinants of Health   Financial Resource Strain: Not on file  Food Insecurity: Not on file  Transportation Needs: Not on file  Physical Activity: Not on file  Stress: Not on file  Social Connections: Not on file    Review of Systems: A 12 point ROS discussed and pertinent positives are indicated in the HPI above.  All other systems are negative.  Review of Systems  Constitutional:  Negative for chills and fever.  Respiratory:  Positive for cough. Negative for chest tightness and shortness of breath.   Cardiovascular:  Negative for chest pain and leg swelling.  Gastrointestinal:  Negative for abdominal pain, constipation, diarrhea, nausea and vomiting.  Neurological:  Negative for dizziness and headaches.  Psychiatric/Behavioral:  Negative for confusion.     Vital Signs: BP 134/69 (BP Location: Left Arm)   Pulse (!) 103   SpO2 98%     Physical Exam Vitals reviewed.  Constitutional:      General: She is not in acute distress.    Appearance: She is not ill-appearing.  HENT:     Mouth/Throat:     Mouth: Mucous membranes are moist.  Cardiovascular:      Rate and Rhythm: Regular rhythm. Tachycardia present.     Pulses: Normal pulses.     Heart sounds: Normal heart sounds.     Comments: Patient had mild tachycardia from immediately prior to thoracentesis to current time Pulmonary:     Effort: Pulmonary effort is normal. No respiratory distress.     Comments: Diminished breath sounds in upper right lung fields Abdominal:     General: Bowel sounds are normal.     Palpations: Abdomen is soft.     Tenderness: There is no abdominal tenderness.  Musculoskeletal:     Right lower leg: No edema.     Left lower leg: No edema.  Skin:    General: Skin is warm and dry.  Neurological:     Mental Status: She is alert and oriented to person, place, and time.  Psychiatric:  Mood and Affect: Mood normal.        Behavior: Behavior normal.        Thought Content: Thought content normal.        Judgment: Judgment normal.     Imaging: DG Chest Port 1 View  Result Date: 07/19/2022 CLINICAL DATA:  Follow-up pneumothorax EXAM: PORTABLE CHEST 1 VIEW COMPARISON:  07/19/2022, 05/21/2022 FINDINGS: Left lung grossly clear. Similar residual pleural effusion and right lower lobe collapse. Increased moderate right pneumothorax with greater apical medial, apicolateral and right basilar component compared to the prior radiograph. IMPRESSION: Known right pneumothorax appears slightly increased in size as compared with radiograph at 4:23 p.m. no midline shift. Similar residual right pleural effusion and right lower lobe collapse. Electronically Signed   By: Donavan Foil M.D.   On: 07/19/2022 17:54   DG Chest Port 1 View  Result Date: 07/19/2022 CLINICAL DATA:  Follow-up pneumothorax status post right thoracentesis EXAM: PORTABLE CHEST 1 VIEW COMPARISON:  Multiple priors FINDINGS: Cardiomediastinal silhouette is within normal limits. Moderate right pleural effusion. Similar appearance of apical right pneumothorax. There is now a new subpleural component with  a more pronounced air-fluid level and likely unchanged right lower lobe collapse. The left lung remains clear. Osseous structures are unremarkable. IMPRESSION: There is more pronounced appearance of subpleural component of right-sided pneumothorax with likely unchanged right lower lobe collapse. Findings likely represent pneumothorax ex vacuo given history of similar findings on prior chest radiographs following thoracentesis. Patient states that she feels similar to slightly improved following thoracentesis. Another chest radiograph will be obtained in 1 hour to ensure no further changes. Electronically Signed   By: Albin Felling M.D.   On: 07/19/2022 16:47   DG Chest Port 1 View  Result Date: 07/19/2022 CLINICAL DATA:  Status post thoracentesis EXAM: PORTABLE CHEST 1 VIEW COMPARISON:  05/21/2022 FINDINGS: Mild cardiomegaly. Moderate right pleural effusion and associated atelectasis or consolidation. Small right apical pneumothorax, approximately 15% in volume. Left lung is normally aerated. Osseous structures unremarkable. IMPRESSION: 1. Small right apical pneumothorax, approximately 15% in volume, similar in volume to pneumothorax seen following prior thoracentesis dated 05/21/2022. 2. Moderate right pleural effusion and associated atelectasis or consolidation. 3. Mild cardiomegaly. 4. No new airspace opacity. These results will be called to the ordering clinician or representative by the Radiologist Assistant, and communication documented in the PACS or [employer]. Electronically Signed   By: Delanna Ahmadi M.D.   On: 07/19/2022 15:35   CT CHEST ABDOMEN PELVIS W CONTRAST  Result Date: 07/14/2022 CLINICAL DATA:  Lung cancer with fluid accumulation. Worsening fluid accumulation. * Tracking Code: BO * EXAM: CT CHEST, ABDOMEN, AND PELVIS WITH CONTRAST TECHNIQUE: Multidetector CT imaging of the chest, abdomen and pelvis was performed following the standard protocol during bolus administration of  intravenous contrast. RADIATION DOSE REDUCTION: This exam was performed according to the departmental dose-optimization program which includes automated exposure control, adjustment of the mA and/or kV according to patient size and/or use of iterative reconstruction technique. CONTRAST:  160mL OMNIPAQUE IOHEXOL 300 MG/ML  SOLN COMPARISON:  PET-CT 04/13/2022. FINDINGS: CT CHEST FINDINGS Cardiovascular: The RIGHT lobe pulmonary arteries truncated as ti enters the consolidated RIGHT lobe lower lobe. No acute findings of the vascular structures. Mediastinum/Nodes: Small mediastinal lymph nodes similar prior. No supraclavicular adenopathy. No axillary adenopathy Lungs/Pleura: Similar large volume layering RIGHT pleural effusion. There is collapse of the RIGHT lower lobe and RIGHT middle lobe. Poor enhancement of the collapsed rounded RIGHT lower lobe (image 36/2. Musculoskeletal: No  aggressive osseous lesion. CT ABDOMEN AND PELVIS FINDINGS Hepatobiliary: No focal hepatic lesion. No biliary ductal dilatation. Gallbladder is normal. Common bile duct is normal. Pancreas: Pancreas is normal. No ductal dilatation. No pancreatic inflammation. Spleen: Normal spleen Adrenals/urinary tract: Adrenal glands and kidneys are normal. The ureters and bladder normal. Stomach/Bowel: Stomach, small bowel, appendix, and cecum are normal. The colon and rectosigmoid colon are normal. Vascular/Lymphatic: Abdominal aorta is normal caliber. There is no retroperitoneal or periportal lymphadenopathy. No pelvic lymphadenopathy. Reproductive: Uterus and adnexa unremarkable. Other: No free fluid. Musculoskeletal: No aggressive osseous lesion. IMPRESSION: Chest Impression: 1. No significant interval change from PET-CT 04/13/2022. 2. Large RIGHT pleural effusion with dense atelectasis the RIGHT lower lobe. Poor enhancement of the RIGHT lower lobe could represent superinfection of the RIGHT lower lobe. Cannot exclude neoplasm. Abdomen / Pelvis  Impression: No evidence of metastatic disease in the abdomen pelvis. Electronically Signed   By: Suzy Bouchard M.D.   On: 07/14/2022 14:30    Labs:  CBC: Recent Labs    01/29/22 1248 02/26/22 1317 04/16/22 0905 07/19/22 1112  WBC 4.6 3.8* 5.3 4.5  HGB 12.0 12.6 12.5 13.5  HCT 34.7* 36.7 36.2 39.9  PLT 257 281 283 292    COAGS: No results for input(s): "INR", "APTT" in the last 8760 hours.  BMP: Recent Labs    01/29/22 1248 02/26/22 1317 04/16/22 0905 07/19/22 1112  NA 129* 130* 132* 135  K 3.9 4.0 4.2 4.2  CL 96* 99 99 98  CO2 24 24 23 24   GLUCOSE 101* 146* 116* 88  BUN 7* 8 8 12   CALCIUM 9.3 9.2 9.1 9.5  CREATININE 0.44 0.65 0.69 0.84  GFRNONAA >60 >60 >60 >60    LIVER FUNCTION TESTS: Recent Labs    01/29/22 1248 02/26/22 1317 04/16/22 0905 07/19/22 1112  BILITOT 0.8 0.4 0.8 0.5  AST 25 28 24 30   ALT 21 18 18 20   ALKPHOS 125 136* 122 121  PROT 7.7 8.0 7.8 8.4*  ALBUMIN 4.2 4.3 4.0 4.4    TUMOR MARKERS: No results for input(s): "AFPTM", "CEA", "CA199", "CHROMGRNA" in the last 8760 hours.  Assessment and Plan:  Jeanmarie Mccowen is a 66 yo female with pneumothorax s/p thoracentesis performed 07/19/22. Serial chest x-rays revealed an enlarging right pneumothorax following thoracentesis. The patient is asymptomatic and claims that she is feeling better than she was before the thoracentesis. However, it was determined that the patient required admission due to the enlarging nature of the pneumothorax. Per discussion with Dr Maryelizabeth Kaufmann, an image-guided right thoracostomy with chest tube placement is planned for the morning of 07/20/22.  Risks and benefits of chest tube placement were discussed with the patient including bleeding, infection, damage to adjacent structures, malfunction of the tube requiring additional procedures and sepsis.  All of the patient's questions were answered, patient is agreeable to proceed. Consent signed and in IR suite.   Thank you  for this interesting consult.  I greatly enjoyed meeting LANAIYA LANTRY and look forward to participating in their care.  A copy of this report was sent to the requesting provider on this date.  Electronically Signed: Lura Em, PA-C 07/19/2022, 6:12 PM   I spent a total of 40 Minutes    in face to face in clinical consultation, greater than 50% of which was counseling/coordinating care for right pneumothorax.

## 2022-07-19 NOTE — H&P (Signed)
History and Physical    Patient: Katherine Perry:[id number] DOB: 1956/04/15 DOA: 07/19/2022 DOS: the patient was seen and examined on 07/20/2022 PCP: Rusty Aus, MD  Patient coming from: Home  Chief Complaint:  Chief Complaint  Patient presents with   Chest Injury   HPI: Katherine Perry is a 66 y.o. 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  Katherine Perry states she was having a normal follow-up visit with her oncologist today.  In the last few weeks, she has been experiencing shortness of breath, dyspnea on exertion, nonproductive cough and voice changes.  These same symptoms generally occurred whenever her pleural effusion reaccumulates.  Dr. Janese Banks ordered a thoracentesis that was performed today.  Postoperatively, chest x-ray demonstrated a small right apical pneumothorax approximately 15% in volume.  Repeat x-ray with small increase in pneumothorax.  Due to this, patient was sent to the ED.  At this time, Katherine Perry states her shortness of breath has improved since the thoracentesis.  She denies any recent fever, chills, unintentional weight loss, night sweats, nausea, vomiting, diarrhea.  ED course: On arrival to the ED, patient was hypertensive at 190/91 with heart rate of 114.  She was saturating at 100% on room air.  Initial workup demonstrated normal CBC and CMP. Additional chest ray obtained that demonstrated right-sided hydropneumothorax with mildly increased air component.  TRH contacted for admission for acute pneumothorax.  Review of Systems: As mentioned in the history of present illness. All other systems reviewed and are negative.  Past Medical History:  Diagnosis Date   Anxiety    Asthma    Cancer ([place])    Cough    Dyspnea    with  exertion   GERD (gastroesophageal reflux disease)    Hypertension    Lung cancer [facility])    Past Surgical History:  Procedure Laterality Date   BUNIONECTOMY Bilateral    COLONOSCOPY     COLONOSCOPY WITH PROPOFOL N/A 07/30/2021   Procedure: COLONOSCOPY WITH PROPOFOL;  Surgeon: Annamaria Helling, DO;  Location: [facility] ENDOSCOPY;  Service: Gastroenterology;  Laterality: N/A;   ESOPHAGOGASTRODUODENOSCOPY  04/01/2006   PORTA CATH INSERTION N/A 12/22/2020   Procedure: PORTA CATH INSERTION;  Surgeon: Algernon Huxley, MD;  Location: [place] CV LAB;  Service: Cardiovascular;  Laterality: N/A;   PORTA CATH REMOVAL N/A 05/31/2022   Procedure: PORTA CATH REMOVAL;  Surgeon: Algernon Huxley, MD;  Location: [place] CV LAB;  Service: Cardiovascular;  Laterality: N/A;   VIDEO BRONCHOSCOPY WITH ENDOBRONCHIAL NAVIGATION N/A 11/07/2020   Procedure: VIDEO BRONCHOSCOPY WITH ENDOBRONCHIAL NAVIGATION;  Surgeon: Ottie Glazier, MD;  Location: [facility] ORS;  Service: Thoracic;  Laterality: N/A;   VIDEO BRONCHOSCOPY WITH ENDOBRONCHIAL ULTRASOUND N/A 11/07/2020   Procedure: VIDEO BRONCHOSCOPY WITH ENDOBRONCHIAL ULTRASOUND;  Surgeon: Ottie Glazier, MD;  Location: [facility] ORS;  Service: Thoracic;  Laterality: N/A;   Social History:  reports that she quit smoking about 27 years ago. Her smoking use included cigarettes. She has a 40.20 pack-year smoking history. She has never used smokeless tobacco. She reports current alcohol use. She reports that she does not use drugs.  Allergies  Allergen Reactions   Ace Inhibitors Cough    Family History  Problem Relation Age of Onset   Breast cancer Neg Hx     Prior to Admission medications  Medication Sig Start Date End Date Taking? Authorizing Provider  acetaminophen (TYLENOL) 500 MG tablet Take 500 mg by mouth every 6 (six) hours as needed.    [provider]  ALPRAZolam Duanne Moron) 0.5 MG tablet Take 0.5 mg by mouth at bedtime as needed for sleep. 10/18/20    [provider]  azelastine (ASTELIN) 0.1 % nasal spray Place 2 sprays into both nostrils 2 (two) times daily. Use in each nostril as directed 05/08/21   Verlon Au, NP  budesonide-formoterol (SYMBICORT) 80-4.5 MCG/ACT inhaler Inhale 2 puffs into the lungs 2 (two) times daily as needed.    [provider]  Cholecalciferol 50 MCG (2000 UT) CAPS Take 2,000 Units by mouth daily.    [provider]  esomeprazole (NEXIUM) 20 MG capsule Take 20 mg by mouth as needed.    [provider]  fluticasone (FLONASE) 50 MCG/ACT nasal spray Place 2 sprays into both nostrils daily as needed for rhinitis. 09/22/20   [provider]  folic acid (FOLVITE) 1 MG tablet TAKE 1 TABLET BY MOUTH EVERY DAY 05/18/22   Sindy Guadeloupe, MD  influenza vac split quadrivalent PF (FLUARIX QUADRIVALENT) 0.5 ML injection Inject into the muscle. 05/11/22   Carlyle Basques, MD  levocetirizine (XYZAL) 5 MG tablet Take 5 mg by mouth every evening. Patient not taking: Reported on 05/31/2022    [provider]  metoprolol succinate (TOPROL-XL) 50 MG 24 hr tablet Take 50 mg by mouth every morning. 10/16/20   [provider]  montelukast (SINGULAIR) 10 MG tablet Take 1 tablet by mouth at bedtime. 11/03/21 11/03/22  [provider]  zolpidem (AMBIEN) 10 MG tablet Take 10 mg by mouth at bedtime as needed for sleep. 10/16/20   [provider]    Physical Exam: Vitals:   07/19/22 1902 07/19/22 2300 07/19/22 2330 07/20/22 0000  BP: (!) 190/91 (!) 152/76 (!) 171/81 (!) 169/85  Pulse: (!) 114 99 95 98  Resp: 18 18 16 19   Temp: 98 F (36.7 C)     TempSrc: Oral     SpO2: 100% 99% 99% 97%   Physical Exam Vitals and nursing note reviewed.  Constitutional:      General: She is not in acute distress.    Appearance: She is normal weight. She is not toxic-appearing.  HENT:     Head: Normocephalic and atraumatic.     Mouth/Throat:     Mouth: Mucous membranes are  moist.     Pharynx: Oropharynx is clear.  Eyes:     Conjunctiva/sclera: Conjunctivae normal.     Pupils: Pupils are equal, round, and reactive to light.  Cardiovascular:     Rate and Rhythm: Regular rhythm. Tachycardia present.     Heart sounds: No murmur heard.    No gallop.  Pulmonary:     Effort: Pulmonary effort is normal. No respiratory distress.     Breath sounds: Wheezing (Diffuse inspiratory and expiratory wheezing throughout) present. No decreased breath sounds, rhonchi or rales.  Abdominal:     General: Bowel sounds are normal. There is no distension.     Palpations: Abdomen is soft.     Tenderness: There is no abdominal tenderness. There is no guarding.  Musculoskeletal:     Right lower leg: No edema.     Left lower leg: No edema.  Skin:    General: Skin is warm and dry.  Neurological:     General: No focal deficit present.     Mental Status:  She is alert and oriented to person, place, and time. Mental status is at baseline.  Psychiatric:        Mood and Affect: Mood normal.        Behavior: Behavior normal.        Thought Content: Thought content normal.        Judgment: Judgment normal.    Data Reviewed: CBC with WBC of 6.2, hemoglobin of 13.7 and platelets of 313 CMP with sodium of 137, potassium of 4.0, bicarb 25, creatinine of 0.84, and anion gap of 11 Pleural fluid culture with no WBCs noted on Gram stain Pleural glucose 108 Pleural protein 5.5. Pleural LDH 87  EKG personally reviewed.  Sinus rhythm with rate of 105.  Right axis deviation.  DG Chest 2 View  Result Date: 07/19/2022 CLINICAL DATA:  Pneumothorax R side. Timed to ensure no progression EXAM: CHEST - 2 VIEW COMPARISON:  July 19, 2022 FINDINGS: The cardiomediastinal silhouette is unchanged in contour.Revisualization of a RIGHT-sided hydropneumothorax. Air component appears mildly increased in comparison to priors. Revisualization of RIGHT-sided atelectasis. Visualized abdomen is unremarkable.  Mild degenerative changes of the thoracic spine. IMPRESSION: Revisualization of a RIGHT-sided hydropneumothorax. Air component appears mildly increased in comparison to priors. Electronically Signed   By: Valentino Saxon M.D.   On: 07/19/2022 19:45   DG Chest Port 1 View  Result Date: 07/19/2022 CLINICAL DATA:  Follow-up pneumothorax EXAM: PORTABLE CHEST 1 VIEW COMPARISON:  07/19/2022, 05/21/2022 FINDINGS: Left lung grossly clear. Similar residual pleural effusion and right lower lobe collapse. Increased moderate right pneumothorax with greater apical medial, apicolateral and right basilar component compared to the prior radiograph. IMPRESSION: Known right pneumothorax appears slightly increased in size as compared with radiograph at 4:23 p.m. no midline shift. Similar residual right pleural effusion and right lower lobe collapse. Electronically Signed   By: Donavan Foil M.D.   On: 07/19/2022 17:54   DG Chest Port 1 View  Result Date: 07/19/2022 CLINICAL DATA:  Follow-up pneumothorax status post right thoracentesis EXAM: PORTABLE CHEST 1 VIEW COMPARISON:  Multiple priors FINDINGS: Cardiomediastinal silhouette is within normal limits. Moderate right pleural effusion. Similar appearance of apical right pneumothorax. There is now a new subpleural component with a more pronounced air-fluid level and likely unchanged right lower lobe collapse. The left lung remains clear. Osseous structures are unremarkable. IMPRESSION: There is more pronounced appearance of subpleural component of right-sided pneumothorax with likely unchanged right lower lobe collapse. Findings likely represent pneumothorax ex vacuo given history of similar findings on prior chest radiographs following thoracentesis. Patient states that she feels similar to slightly improved following thoracentesis. Another chest radiograph will be obtained in 1 hour to ensure no further changes. Electronically Signed   By: Albin Felling M.D.   On:  07/19/2022 16:47   DG Chest Port 1 View  Result Date: 07/19/2022 CLINICAL DATA:  Status post thoracentesis EXAM: PORTABLE CHEST 1 VIEW COMPARISON:  05/21/2022 FINDINGS: Mild cardiomegaly. Moderate right pleural effusion and associated atelectasis or consolidation. Small right apical pneumothorax, approximately 15% in volume. Left lung is normally aerated. Osseous structures unremarkable. IMPRESSION: 1. Small right apical pneumothorax, approximately 15% in volume, similar in volume to pneumothorax seen following prior thoracentesis dated 05/21/2022. 2. Moderate right pleural effusion and associated atelectasis or consolidation. 3. Mild cardiomegaly. 4. No new airspace opacity. These results will be called to the ordering clinician or representative by the Radiologist Assistant, and communication documented in the PACS or [employer]. Electronically Signed   By: Lanae Crumbly  Laqueta Carina M.D.   On: 07/19/2022 15:35     Results are pending, will review when available.  Assessment and Plan: * Pneumothorax In the setting of thoracentesis.  Initial chest x-ray with pneumothorax estimated to be 15% with gradual small increases with each subsequent chest x-ray.  At this time, patient is asymptomatic with no shortness of breath or hypoxia.  She is hemodynamically stable.  - Per IR, plans to place chest tube in the a.m. of 12/19 - Pulmonology consulted; message sent to Dr. Patsey Berthold as noted below - Chest x-ray ordered for 4 AM on 12/19 - Stat chest x-ray with any changes in hemodynamics or respirations  Recurrent pleural effusion on right Per chart review, patient has a history of recurrent pleural effusions dating back to June 2023 s/p thoracentesis x 4 with most recent thoracentesis today.  Workup has largely been negative on etiology.  Cytology has always been negative for malignant cells. Prior cultures have been negative as well.   Pleural studies today meet Light's criteria for exudative nature.   Patient  is requesting a second opinion; she was previously following with [facility] pulmonology but has an upcoming appointment with [facility] pulmonology.   - Inpatient consult to: Pulmonology; message sent to Dr. Patsey Berthold - Add body cell count to pleural studies obtained today  COPD (chronic obstructive pulmonary disease) ([place]) Patient has a history of COPD with moderate obstruction noted on PFTs obtained on 05/2022. She is only using Symbicort PRN and not daily. Significant wheezing on examination today.   - Start maintenance bronchodilators scheduled - DuoNebs as needed  Sinus tachycardia EKG today with sinus tachycardia.  Patient denies any prior history of known tachycardia.  Per chart review, heart rates at outpatient visits typically in the 90s, however she did have sinus tachycardia when she had her Port-A-Cath removed.  In addition, patient notes that she decreased her home metoprolol to half dose as she was concerned it was hearing issues.    - Given pneumothorax, will monitor telemetry overnight to ensure improvement in heart rate.   - If any changes in heart rate noted, will need a stat chest x-ray - Restart home metoprolol at prior dose of 50 mg daily  Malignant neoplasm of lower lobe of right lung [facility]) Patient has a history of stage III adenocarcinoma of the right lower lobe s/p chemotherapy, radiation and maintenance immunotherapy.  She follows with Dr. Janese Banks.  Advance Care Planning:   Code Status: Full Code   Consults: Pulmonology, IR  Family Communication: Patient's husband updated at bedside  Severity of Illness: The appropriate patient status for this patient is OBSERVATION. Observation status is judged to be reasonable and necessary in order to provide the required intensity of service to ensure the patient's safety. The patient's presenting symptoms, physical exam findings, and initial radiographic and laboratory data in the context of their medical condition is felt to place them  at decreased risk for further clinical deterioration. Furthermore, it is anticipated that the patient will be medically stable for discharge from the hospital within 2 midnights of admission.   Author: Jose Persia, MD 07/20/2022 1:14 AM  For on call review [email or web address].

## 2022-07-19 NOTE — Progress Notes (Signed)
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  She quit smoking about 26 years ago.  Other medical problems include hypertension and hyponatremia.She has been treated for iron of possible pneumonia for the last 1 year.  She has a history of intermittent asthma for which she uses Advair.  She was seen by Dr. And underwent CT chest which showed dense infiltrate/solid mass in the right lower lobe with contiguous airspace opacity in the right upper lobe.  Findings could be secondary to pneumonia but concerning for bronchogenic carcinoma.  Patient then had a PET CT scan which showed consolidation in the medial aspect of the right lower lobe measuring 3.4 x 4 cm with an SUV of 11.6.  Hypermetabolic masslike thickening in the right hilum measuring 2.2 cm with intense hypermetabolic activity.  Groundglass airspace consolidation in the posterior aspect of the right upper lobe with an SUV of 11.4.  The right upper lobe opacity was nonspecific and differentials include pulmonary infection versus lymphangitic spread of carcinoma.  CT chest showed showed masslike area of distortion involving right lower lobe middle lobe as well as right upper lobe.  Generalized right hilar masslike appearance concerning for nodal involvement.   Patient underwent bronchoscopy and biopsies.Right lower lobe was concerning for adenocarcinoma.  Lymph node station 11 R, 4R, 7 were  negative for malignancy.  No malignant cells identified in the right upper lobe.  Case was discussed at tumor board and given the hypermetabolism in the right upper lobe as well as hilum involvement cannot be ruled out despite negative biopsies.  Patient completed concurrent chemoradiation with weekly CarboTaxol in July 2022.  Scan showed partial response.  Maintenance durvalumab completed in July 2023  Interval history-patient currently does report exertional shortness of breath as well as more cough when she speaks for longer duration.  She states that she has had similar symptoms when she feels that her fluid is reaccumulating.  Denies any fever or expectoration  ECOG PS- 1 Pain scale- 0   Review of systems- Review of Systems  Constitutional:  Positive for malaise/fatigue. Negative for chills, fever and weight loss.  HENT:  Negative for congestion, ear discharge and nosebleeds.   Eyes:  Negative for blurred vision.  Respiratory:  Positive for cough and shortness of breath. Negative for hemoptysis, sputum production and wheezing.   Cardiovascular:  Negative for chest pain, palpitations, orthopnea and claudication.  Gastrointestinal:  Negative for abdominal pain, blood in stool, constipation, diarrhea, heartburn, melena, nausea and vomiting.  Genitourinary:  Negative for dysuria, flank pain, frequency, hematuria and urgency.  Musculoskeletal:  Negative for back pain, joint pain and myalgias.  Skin:  Negative for rash.  Neurological:  Negative for dizziness, tingling, focal weakness, seizures, weakness and headaches.  Endo/Heme/Allergies:  Does not bruise/bleed easily.  Psychiatric/Behavioral:  Negative for depression and suicidal ideas. The patient does not have insomnia.       Allergies  Allergen Reactions   Ace Inhibitors Cough  Past Medical History:  Diagnosis Date   Anxiety    Asthma    Cancer ([place])    Cough    Dyspnea    with exertion   GERD (gastroesophageal reflux  disease)    Hypertension    Lung cancer [facility])      Past Surgical History:  Procedure Laterality Date   BUNIONECTOMY Bilateral    COLONOSCOPY     COLONOSCOPY WITH PROPOFOL N/A 07/30/2021   Procedure: COLONOSCOPY WITH PROPOFOL;  Surgeon: Annamaria Helling, DO;  Location[facility];  Service: Gastroenterology;  Laterality: N/A;   ESOPHAGOGASTRODUODENOSCOPY  04/01/2006   PORTA CATH INSERTION N/A 12/22/2020   Procedure: PORTA CATH INSERTION;  Surgeon: Algernon Huxley, MD;  Location: [place] [facility];  Service: Cardiovascular;  Laterality: N/A;   PORTA CATH REMOVAL N/A 05/31/2022   Procedure: PORTA CATH REMOVAL;  Surgeon: Algernon Huxley, MD;  Location: [place] [facility];  Service: Cardiovascular;  Laterality: N/A;   VIDEO BRONCHOSCOPY WITH ENDOBRONCHIAL NAVIGATION N/A 11/07/2020   Procedure: VIDEO BRONCHOSCOPY WITH ENDOBRONCHIAL NAVIGATION;  Surgeon: Ottie Glazier, MD;  Location: [facility] ORS;  Service: Thoracic;  Laterality: N/A;   VIDEO BRONCHOSCOPY WITH ENDOBRONCHIAL ULTRASOUND N/A 11/07/2020   Procedure: VIDEO BRONCHOSCOPY WITH ENDOBRONCHIAL ULTRASOUND;  Surgeon: Ottie Glazier, MD;  Location: [facility] ORS;  Service: Thoracic;  Laterality: N/A;    Social History   Socioeconomic History   Marital status: Married    Spouse name: Not on file   Number of children: Not on file   Years of education: Not on file   Highest education level: Not on file  Occupational History   Not on file  Tobacco Use   Smoking status: Former    Packs/day: 1.50    Years: 26.80    Total pack years: 40.20    Types: Cigarettes    Quit date: 11/07/1994    Years since quitting: 27.7   Smokeless tobacco: Never  Vaping Use   Vaping Use: Never used  Substance and Sexual Activity   Alcohol use: Yes    Comment:  wine daily   Drug use: Never   Sexual activity: Yes  Other Topics Concern   Not on file  Social History Narrative   Lives with husband, Thayer Jew, no pets.   Social Determinants of Health    Financial Resource Strain: Not on file  Food Insecurity: Not on file  Transportation Needs: Not on file  Physical Activity: Not on file  Stress: Not on file  Social Connections: Not on file  Intimate Partner Violence: Not on file    Family History  Problem Relation Age of Onset   Breast cancer Neg Hx      Current Outpatient Medications:    acetaminophen (TYLENOL) 500 MG tablet, Take 500 mg by mouth every 6 (six) hours as needed., Disp: , Rfl:    ALPRAZolam (XANAX) 0.5 MG tablet, Take 0.5 mg by mouth at bedtime as needed for sleep., Disp: , Rfl:    azelastine (ASTELIN) 0.1 % nasal spray, Place 2 sprays into both nostrils 2 (two) times daily. Use in each nostril as directed, Disp: 30 mL, Rfl: 2   budesonide-formoterol (SYMBICORT) 80-4.5 MCG/ACT inhaler, Inhale 2 puffs into the lungs 2 (two) times daily as needed., Disp: , Rfl:    Cholecalciferol 50 MCG (2000 UT) CAPS, Take 2,000 Units by mouth daily., Disp: , Rfl:    esomeprazole (NEXIUM) 20 MG capsule, Take 20 mg by mouth as needed., Disp: , Rfl:  fluticasone (FLONASE) 50 MCG/ACT nasal spray, Place 2 sprays into both nostrils daily as needed for rhinitis., Disp: , Rfl:    folic acid (FOLVITE) 1 MG tablet, TAKE 1 TABLET BY MOUTH EVERY DAY, Disp: 90 tablet, Rfl: 3   influenza vac split quadrivalent PF (FLUARIX QUADRIVALENT) 0.5 ML injection, Inject into the muscle., Disp: 0.5 mL, Rfl: 0   metoprolol succinate (TOPROL-XL) 50 MG 24 hr tablet, Take 50 mg by mouth every morning., Disp: , Rfl:    montelukast (SINGULAIR) 10 MG tablet, Take 1 tablet by mouth at bedtime., Disp: , Rfl:    zolpidem (AMBIEN) 10 MG tablet, Take 10 mg by mouth at bedtime as needed for sleep., Disp: , Rfl:    levocetirizine (XYZAL) 5 MG tablet, Take 5 mg by mouth every evening. (Patient not taking: Reported on 05/31/2022), Disp: , Rfl:  No current facility-administered medications for this visit.  Facility-Administered Medications Ordered in Other Visits:     heparin lock flush 100 UNIT/ML injection, , , ,    heparin lock flush 100 UNIT/ML injection, , , ,    heparin lock flush 100 UNIT/ML injection, , , ,    heparin lock flush 100 UNIT/ML injection, , , ,    heparin lock flush 100 unit/mL, 500 Units, Intravenous, Once, Sindy Guadeloupe, MD   sodium chloride flush (NS) 0.9 % injection 10 mL, 10 mL, Intravenous, Once, Sindy Guadeloupe, MD   sodium chloride flush (NS) 0.9 % injection 10 mL, 10 mL, Intravenous, PRN, Sindy Guadeloupe, MD, 10 mL at 01/26/21 0807   sodium chloride flush (NS) 0.9 % injection 10 mL, 10 mL, Intravenous, Once, Sindy Guadeloupe, MD  Physical exam:  Vitals:   07/19/22 1136  BP: (!) 153/83  Pulse: 98  Temp: (!) 97.5 F (36.4 C)  TempSrc: Tympanic  SpO2: (!) 10%  Weight: 142 lb (64.4 kg)  Height: 5\' 5"  (1.651 m)   Physical Exam Constitutional:      General: She is not in acute distress. Cardiovascular:     Rate and Rhythm: Normal rate and regular rhythm.     Heart sounds: Normal heart sounds.  Pulmonary:     Effort: Pulmonary effort is normal.     Comments: Breath sounds decreased over right lung base Skin:    General: Skin is warm and dry.  Neurological:     Mental Status: She is alert and oriented to person, place, and time.         Latest Ref Rng & Units 07/19/2022   11:12 AM  CMP  Glucose 70 - 99 mg/dL 88   BUN 8 - 23 mg/dL 12   Creatinine 0.44 - 1.00 mg/dL 0.84   Sodium 135 - 145 mmol/L 135   Potassium 3.5 - 5.1 mmol/L 4.2   Chloride 98 - 111 mmol/L 98   CO2 22 - 32 mmol/L 24   Calcium 8.9 - 10.3 mg/dL 9.5   Total Protein 6.5 - 8.1 g/dL 8.4   Total Bilirubin 0.3 - 1.2 mg/dL 0.5   Alkaline Phos 38 - 126 U/L 121   AST 15 - 41 U/L 30   ALT 0 - 44 U/L 20       Latest Ref Rng & Units 07/19/2022   11:12 AM  CBC  WBC 4.0 - 10.5 K/uL 4.5   Hemoglobin 12.0 - 15.0 g/dL 13.5   Hematocrit 36.0 - 46.0 % 39.9   Platelets 150 - 400 K/uL 292     No  images are attached to the encounter.  CT CHEST ABDOMEN  PELVIS W CONTRAST  Result Date: 07/14/2022 CLINICAL DATA:  Lung cancer with fluid accumulation. Worsening fluid accumulation. * Tracking Code: BO * EXAM: CT CHEST, ABDOMEN, AND PELVIS WITH CONTRAST TECHNIQUE: Multidetector CT imaging of the chest, abdomen and pelvis was performed following the standard protocol during bolus administration of intravenous contrast. RADIATION DOSE REDUCTION: This exam was performed according to the departmental dose-optimization program which includes automated exposure control, adjustment of the mA and/or kV according to patient size and/or use of iterative reconstruction technique. CONTRAST:  134mL OMNIPAQUE IOHEXOL 300 MG/ML  SOLN COMPARISON:  PET-CT 04/13/2022. FINDINGS: CT CHEST FINDINGS Cardiovascular: The RIGHT lobe pulmonary arteries truncated as ti enters the consolidated RIGHT lobe lower lobe. No acute findings of the vascular structures. Mediastinum/Nodes: Small mediastinal lymph nodes similar prior. No supraclavicular adenopathy. No axillary adenopathy Lungs/Pleura: Similar large volume layering RIGHT pleural effusion. There is collapse of the RIGHT lower lobe and RIGHT middle lobe. Poor enhancement of the collapsed rounded RIGHT lower lobe (image 36/2. Musculoskeletal: No aggressive osseous lesion. CT ABDOMEN AND PELVIS FINDINGS Hepatobiliary: No focal hepatic lesion. No biliary ductal dilatation. Gallbladder is normal. Common bile duct is normal. Pancreas: Pancreas is normal. No ductal dilatation. No pancreatic inflammation. Spleen: Normal spleen Adrenals/urinary tract: Adrenal glands and kidneys are normal. The ureters and bladder normal. Stomach/Bowel: Stomach, small bowel, appendix, and cecum are normal. The colon and rectosigmoid colon are normal. Vascular/Lymphatic: Abdominal aorta is normal caliber. There is no retroperitoneal or periportal lymphadenopathy. No pelvic lymphadenopathy. Reproductive: Uterus and adnexa unremarkable. Other: No free fluid.  Musculoskeletal: No aggressive osseous lesion. IMPRESSION: Chest Impression: 1. No significant interval change from PET-CT 04/13/2022. 2. Large RIGHT pleural effusion with dense atelectasis the RIGHT lower lobe. Poor enhancement of the RIGHT lower lobe could represent superinfection of the RIGHT lower lobe. Cannot exclude neoplasm. Abdomen / Pelvis Impression: No evidence of metastatic disease in the abdomen pelvis. Electronically Signed   By: Suzy Bouchard M.D.   On: 07/14/2022 14:30     Assessment and plan- Patient is a 66 y.o. 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  She is s/p concurrent CarboTaxol chemotherapy followed by 1 year of maintenance durvalumab ending in July 2023.  She is here to discuss CT scan results and further management  I have reviewed CT chest images independently and discussed findings with the patient CT continues to show areas of atelectasis and poor aeration in the right lower lobe along with collapse of the right lower lobe and middle lobe.  Large right pleural effusion.  Underlying neoplasm was not ruled out.  Patient has had cytology x 3 from her pleural fluid over the last 6 months which has been negative for malignancy.  It is unclear if recurrent right pleural effusion is due to underlying lung collapse from prior inflammation and radiation or if there is a component of ongoing infection or neoplasm.  I am planning another thoracentesis at this time and we will again send the fluid out for cytology along with Gram stain protein and LDH and glucose.  Patient is requesting a second opinion from pulmonary at this time and I will refer her to [facility] pulmonary team at this time.  I will also discuss her case at tumor board this week.  Question is if she needs a bronchoscopy to assess what is going on in her right middle lobe and lower lobe.  There is  no overt concern for disease progression at this time and there is no pathological mediastinal  or hilar adenopathy noted that would suggest that this is all secondary to malignancy.  I will see her back in 4 months time with repeat scans   Visit Diagnosis 1. Encounter for follow-up surveillance of lung cancer   2. Pleural effusion on right      Dr. Randa Evens, MD, MPH [facility] [phone] 07/19/2022 2:41 PM

## 2022-07-19 NOTE — Procedures (Signed)
PROCEDURE SUMMARY:  Successful image-guided right thoracentesis. Yielded 1.45 liters of clear yellow fluid. Pt tolerated procedure well. No immediate complications. EBL = trace   Specimen was sent for labs. CXR ordered.  Please see imaging section of Epic for full dictation.  Lura Em PA-C 07/19/2022 3:19 PM

## 2022-07-19 NOTE — Telephone Encounter (Signed)
Pt made aware of appt for thoracentesis today at 2pm and that referral for pulmonary second opinion has been placed. Pt informed to expect a call from [facility] with an appt. Pt verbalized understanding.

## 2022-07-19 NOTE — ED Triage Notes (Signed)
Pt presents to the ED due to pneumothorax. Pt was receiving a thoracentesis and was brought over by the CT department with a +pneumo in the R  lung. Pt has states this is her fourth thoracentesis. Pt is scheduled to have a chest tube placed by IR tomorrow. Pt A&Ox4

## 2022-07-20 ENCOUNTER — Observation Stay: Payer: BC Managed Care – PPO

## 2022-07-20 ENCOUNTER — Other Ambulatory Visit: Payer: Self-pay

## 2022-07-20 ENCOUNTER — Other Ambulatory Visit: Payer: Self-pay | Admitting: *Deleted

## 2022-07-20 ENCOUNTER — Observation Stay: Payer: BC Managed Care – PPO | Admitting: Radiology

## 2022-07-20 DIAGNOSIS — J90 Pleural effusion, not elsewhere classified: Secondary | ICD-10-CM | POA: Diagnosis not present

## 2022-07-20 DIAGNOSIS — R000 Tachycardia, unspecified: Secondary | ICD-10-CM

## 2022-07-20 DIAGNOSIS — J9383 Other pneumothorax: Secondary | ICD-10-CM | POA: Diagnosis not present

## 2022-07-20 DIAGNOSIS — C3431 Malignant neoplasm of lower lobe, right bronchus or lung: Secondary | ICD-10-CM | POA: Diagnosis not present

## 2022-07-20 DIAGNOSIS — J45909 Unspecified asthma, uncomplicated: Secondary | ICD-10-CM

## 2022-07-20 DIAGNOSIS — J95811 Postprocedural pneumothorax: Secondary | ICD-10-CM | POA: Diagnosis not present

## 2022-07-20 DIAGNOSIS — J449 Chronic obstructive pulmonary disease, unspecified: Secondary | ICD-10-CM

## 2022-07-20 LAB — BASIC METABOLIC PANEL
Anion gap: 8 (ref 5–15)
BUN: 14 mg/dL (ref 8–23)
CO2: 25 mmol/L (ref 22–32)
Calcium: 9.6 mg/dL (ref 8.9–10.3)
Chloride: 106 mmol/L (ref 98–111)
Creatinine, Ser: 0.59 mg/dL (ref 0.44–1.00)
GFR, Estimated: >60 mL/min (ref >60)
Glucose, Bld: 98 mg/dL (ref 70–99)
Potassium: 3.5 mmol/L (ref 3.5–5.1)
Sodium: 139 mmol/L (ref 135–145)

## 2022-07-20 LAB — AMYLASE, PLEURAL OR PERITONEAL FLUID: Amylase, Fluid: 96 U/L

## 2022-07-20 LAB — BODY FLUID CELL COUNT WITH DIFFERENTIAL
Eos, Fluid: 1 %
Lymphs, Fluid: 64 %
Monocyte-Macrophage-Serous Fluid: 32 %
Neutrophil Count, Fluid: 3 %
Total Nucleated Cell Count, Fluid: 163 cu mm

## 2022-07-20 LAB — CBC
HCT: 35.6 % — ABNORMAL LOW (ref 36.0–46.0)
Hemoglobin: 11.9 g/dL — ABNORMAL LOW (ref 12.0–15.0)
MCH: 33.4 pg (ref 26.0–34.0)
MCHC: 33.4 g/dL (ref 30.0–36.0)
MCV: 100 fL (ref 80.0–100.0)
Platelets: 262 10*3/uL (ref 150–400)
RBC: 3.56 MIL/uL — ABNORMAL LOW (ref 3.87–5.11)
RDW: 12.7 % (ref 11.5–15.5)
WBC: 4.5 10*3/uL (ref 4.0–10.5)
nRBC: 0 % (ref 0.0–0.2)

## 2022-07-20 LAB — PROTIME-INR
INR: 1 (ref 0.8–1.2)
Prothrombin Time: 13.3 seconds (ref 11.4–15.2)

## 2022-07-20 LAB — LACTATE DEHYDROGENASE: LDH: 122 U/L (ref 98–192)

## 2022-07-20 LAB — HIV ANTIBODY (ROUTINE TESTING W REFLEX): HIV Screen 4th Generation wRfx: NONREACTIVE

## 2022-07-20 MED ORDER — ALPRAZOLAM 0.5 MG PO TABS: 0.5000 mg | ORAL_TABLET | Freq: Every evening | ORAL | Status: DC | PRN

## 2022-07-20 MED ORDER — MONTELUKAST SODIUM 10 MG PO TABS
10.0000 mg | ORAL_TABLET | Freq: Every day | ORAL | Status: DC
  Administered 2022-07-20 (×2): 10 mg via ORAL
  Filled 2022-07-20 (×2): qty 1

## 2022-07-20 MED ORDER — TORSEMIDE 20 MG PO TABS
20.0000 mg | ORAL_TABLET | Freq: Every day | ORAL | Status: DC
  Administered 2022-07-20: 20 mg via ORAL
  Filled 2022-07-20: qty 1

## 2022-07-20 MED ORDER — ZOLPIDEM TARTRATE 5 MG PO TABS
5.0000 mg | ORAL_TABLET | Freq: Every evening | ORAL | Status: DC | PRN
  Administered 2022-07-20: 5 mg via ORAL
  Filled 2022-07-20 (×2): qty 1

## 2022-07-20 MED ORDER — AZELASTINE HCL 0.1 % NA SOLN
2.0000 | Freq: Two times a day (BID) | NASAL | Status: DC
  Filled 2022-07-20: qty 30

## 2022-07-20 MED ORDER — CEFAZOLIN SODIUM-DEXTROSE 2-4 GM/100ML-% IV SOLN
INTRAVENOUS | Status: AC
  Filled 2022-07-20: qty 100

## 2022-07-20 MED ORDER — IPRATROPIUM-ALBUTEROL 0.5-2.5 (3) MG/3ML IN SOLN: 3.0000 mL | Freq: Four times a day (QID) | RESPIRATORY_TRACT | Status: DC | PRN

## 2022-07-20 MED ORDER — MOMETASONE FURO-FORMOTEROL FUM 100-5 MCG/ACT IN AERO
2.0000 | INHALATION_SPRAY | Freq: Two times a day (BID) | RESPIRATORY_TRACT | Status: DC
  Administered 2022-07-20 – 2022-07-21 (×2): 2 via RESPIRATORY_TRACT
  Filled 2022-07-20 (×2): qty 8.8

## 2022-07-20 MED ORDER — METOPROLOL SUCCINATE ER 50 MG PO TB24
50.0000 mg | ORAL_TABLET | ORAL | Status: DC
  Administered 2022-07-20 – 2022-07-21 (×2): 50 mg via ORAL
  Filled 2022-07-20 (×2): qty 1

## 2022-07-20 NOTE — Assessment & Plan Note (Signed)
Patient has a history of stage III adenocarcinoma of the right lower lobe s/p chemotherapy, radiation and maintenance immunotherapy.  She follows with Dr. Janese Banks.

## 2022-07-20 NOTE — ED Notes (Signed)
Pt denies any chest pain, shortness of breath, or trouble breathing, a&0x4, ambulatory, denies any pain at this time

## 2022-07-20 NOTE — Consult Note (Addendum)
NAME:  Katherine Perry, MRN:  [id number], DOB:  01-Feb-1956, LOS: 0 ADMISSION DATE:  07/19/2022, CONSULTATION DATE:  07/20/2022 REFERRING MD:  Enzo Bi, MD, CHIEF COMPLAINT:  Pleural Effusion   History of Present Illness:  Katherine Perry is a pleasant 66 year old female with a history of known non-small cell lung cancer s/p chemo-radiation with a recurrent pleural effusion.  She had developed a recurrent right-sided pleural effusion status post multiple thoracentesis with interventional radiology.  The pleural fluid had been exudative but cytology remained negative.  She was diagnosed with lung cancer in April 2022 (biopsy at that time of the right lower lobe showed non-small cell lung cancer consistent with adenocarcinoma) and received chemoradiation in coordination with oncology and radiation oncology.  She underwent thoracentesis in June 2023 (650 mL), September 2023 (1 L), October 2023 (1.2 L), and December 2023 (1.5 L).  The pleural fluid was exudative by lights criteria (LDH and protein sent) while cytology has been negative.  She reports that her shortness of breath slowly gets worse between thoracentesis and she could feel that she is going to need it.  She mostly complains of shortness of breath at rest as well as with exertion in addition to a cough.  The improvement in the shortness of breath is very quick following the thoracentesis.  Following yesterday's and October's thoracentesis, she did feel chest pain around the end of the procedure.  She was noted to have a pneumothorax after her thoracenteses yesterday (as well as in October) prompting presentation to the ED for observation.  She has been followed by Dr. Lanney Gins at [facility] clinic pulmonology and the family had asked for a second opinion in regards to the etiology of the pleural effusion.  Pertinent  Medical History  Non-small cell lung cancer  Significant Hospital Events: Including procedures, antibiotic start and stop dates in  addition to other pertinent events   07/19/2022: Thoracentesis, chest x-ray with pneumothorax, serial chest x-rays show stability   Objective   Blood pressure (!) 143/79, pulse 95, temperature 98.3 F (36.8 C), temperature source Oral, resp. rate 19, SpO2 99 %.       No intake or output data in the 24 hours ending 07/20/22 1441 There were no vitals filed for this visit.  Examination: Physical Exam Vitals and nursing note reviewed.  Constitutional:      General: She is not in acute distress.    Appearance: Normal appearance. She is not ill-appearing.  HENT:     Head: Normocephalic.     Nose: Nose normal.     Mouth/Throat:     Mouth: Mucous membranes are moist.  Eyes:     Extraocular Movements: Extraocular movements intact.     Pupils: Pupils are equal, round, and reactive to light.  Cardiovascular:     Rate and Rhythm: Normal rate and regular rhythm.     Pulses: Normal pulses.     Heart sounds: Normal heart sounds.  Pulmonary:     Effort: Pulmonary effort is normal.     Breath sounds: Normal breath sounds.  Abdominal:     Palpations: Abdomen is soft.  Musculoskeletal:     Cervical back: Normal range of motion and neck supple.  Skin:    General: Skin is warm.  Neurological:     General: No focal deficit present.     Mental Status: She is alert.     Assessment & Plan:   #Recurrent Pleural Effusion #Stage III NSCLCa #Pneumothorax  The patient is  up pleasant 66 year old female with a recurrent right-sided pleural effusion in the setting of known non-small lung cancer treated with chemoradiation.  Following her most recent thoracentesis, she developed a large right-sided pneumothorax.  The onset of the pneumothorax followed a sensation of pain in her chest suggesting a trapped/entrapped lung physiology and failure of lung expansion consistent with a pneumothorax ex-vacuo.  I have reviewed her previous imaging studies and could not identify a pleural rind but this does not  preclude the lung from being nonexpandable.  Given the large size of the pneumothorax, I would recommend further observation for another 24 hours with serial chest x-rays every 12 hours prior to patient discharge.  In regards to the nature of the pleural effusion, the differential includes the trapped/entrapped lung physiology noted above (leaning towards entrapped given its exudative nature), recurrent pleural effusion in the setting of malignancy, and a chylothorax given she is underwent radiation to the area near the thoracic duct.  I will be adding cholesterol, triglycerides, and amylase to her pleural fluid studies for further identification of the nature of the effusion.  As to management, I did discuss with the patient that her options include recurrent thoracentesis versus consideration for an indwelling pleural catheter.  This would depend on the nature of the pleural effusion.  She would like to discuss this further with her oncologist and I will be seeing her in clinic in January (prescheduled visit initially for the second opinion).  Labs   CBC: Recent Labs  Lab 07/19/22 1112 07/19/22 1818 07/20/22 0440  WBC 4.5 6.2 4.5  NEUTROABS 3.0 4.4  --   HGB 13.5 13.7 11.9*  HCT 39.9 41.9 35.6*  MCV 99.5 101.7* 100.0  PLT 292 313 673    Basic Metabolic Panel: Recent Labs  Lab 07/19/22 1112 07/19/22 1818 07/20/22 0440  NA 135 137 139  K 4.2 4.0 3.5  CL 98 101 106  CO2 24 25 25   GLUCOSE 88 98 98  BUN 12 19 14   CREATININE 0.84 0.84 0.59  CALCIUM 9.5 10.1 9.6   GFR: Estimated Creatinine Clearance: 63.1 mL/min (by C-G formula based on SCr of 0.59 mg/dL). Recent Labs  Lab 07/19/22 1112 07/19/22 1818 07/20/22 0440  WBC 4.5 6.2 4.5    Liver Function Tests: Recent Labs  Lab 07/19/22 1112 07/19/22 1818  AST 30 30  ALT 20 21  ALKPHOS 121 118  BILITOT 0.5 0.7  PROT 8.4* 8.4*  ALBUMIN 4.4 4.4   No results for input(s): "LIPASE", "AMYLASE" in the last 168 hours. No  results for input(s): "AMMONIA" in the last 168 hours.  ABG No results found for: "PHART", "PCO2ART", "PO2ART", "HCO3", "TCO2", "ACIDBASEDEF", "O2SAT"   Coagulation Profile: Recent Labs  Lab 07/20/22 0440  INR 1.0    Cardiac Enzymes: No results for input(s): "CKTOTAL", "CKMB", "CKMBINDEX", "TROPONINI" in the last 168 hours.  HbA1C: No results found for: "HGBA1C"  CBG: No results for input(s): "GLUCAP" in the last 168 hours.  Review of Systems:   Review of Systems  Constitutional:  Negative for chills, fever, malaise/fatigue and weight loss.  Respiratory:  Positive for shortness of breath.   Cardiovascular:  Positive for chest pain (following thoracentesis, improved). Negative for palpitations and leg swelling.     Past Medical History:  She,  has a past medical history of Anxiety, Asthma, Cancer ([place]), Cough, Dyspnea, GERD (gastroesophageal reflux disease), Hypertension, and Lung cancer ([place]).   Surgical History:   Past Surgical History:  Procedure Laterality Date  BUNIONECTOMY Bilateral    COLONOSCOPY     COLONOSCOPY WITH PROPOFOL N/A 07/30/2021   Procedure: COLONOSCOPY WITH PROPOFOL;  Surgeon: Annamaria Helling, DO;  Location[facility] ENDOSCOPY;  Service: Gastroenterology;  Laterality: N/A;   ESOPHAGOGASTRODUODENOSCOPY  04/01/2006   PORTA CATH INSERTION N/A 12/22/2020   Procedure: PORTA CATH INSERTION;  Surgeon: Algernon Huxley, MD;  Location: [place] CV LAB;  Service: Cardiovascular;  Laterality: N/A;   PORTA CATH REMOVAL N/A 05/31/2022   Procedure: PORTA CATH REMOVAL;  Surgeon: Algernon Huxley, MD;  Location: [place] [facility];  Service: Cardiovascular;  Laterality: N/A;   VIDEO BRONCHOSCOPY WITH ENDOBRONCHIAL NAVIGATION N/A 11/07/2020   Procedure: VIDEO BRONCHOSCOPY WITH ENDOBRONCHIAL NAVIGATION;  Surgeon: Ottie Glazier, MD;  Location: [facility] ORS;  Service: Thoracic;  Laterality: N/A;   VIDEO BRONCHOSCOPY WITH ENDOBRONCHIAL ULTRASOUND N/A 11/07/2020    Procedure: VIDEO BRONCHOSCOPY WITH ENDOBRONCHIAL ULTRASOUND;  Surgeon: Ottie Glazier, MD;  Location: [facility] ORS;  Service: Thoracic;  Laterality: N/A;     Social History:   reports that she quit smoking about 27 years ago. Her smoking use included cigarettes. She has a 40.20 pack-year smoking history. She has never used smokeless tobacco. She reports current alcohol use. She reports that she does not use drugs.   Family History:  Her family history is negative for Breast cancer.   Allergies Allergies  Allergen Reactions   Ace Inhibitors Cough     Home Medications  Prior to Admission medications   Medication Sig Start Date End Date Taking? Authorizing Provider  azelastine (ASTELIN) 0.1 % nasal spray Place 2 sprays into both nostrils 2 (two) times daily. Use in each nostril as directed 05/08/21  Yes Verlon Au, NP  Cholecalciferol 50 MCG (2000 UT) CAPS Take 2,000 Units by mouth daily.   Yes [provider]  esomeprazole (NEXIUM) 20 MG capsule Take 20 mg by mouth as needed.   Yes [provider]  fluticasone (FLONASE) 50 MCG/ACT nasal spray Place 2 sprays into both nostrils daily as needed for rhinitis. 09/22/20  Yes [provider]  folic acid (FOLVITE) 1 MG tablet TAKE 1 TABLET BY MOUTH EVERY DAY 05/18/22  Yes Sindy Guadeloupe, MD  metoprolol succinate (TOPROL-XL) 50 MG 24 hr tablet Take 50 mg by mouth every morning. 10/16/20  Yes [provider]  montelukast (SINGULAIR) 10 MG tablet Take 1 tablet by mouth at bedtime. 11/03/21 11/03/22 Yes [provider]  torsemide (DEMADEX) 20 MG tablet Take 20 mg by mouth daily. 05/19/22 05/19/23 Yes [provider]  zolpidem (AMBIEN) 10 MG tablet Take 5 mg by mouth at bedtime as needed for sleep. 10/16/20  Yes [provider]  acetaminophen (TYLENOL) 500 MG tablet Take 500 mg by mouth every 6 (six) hours as needed.    [provider]  albuterol (ACCUNEB) 1.25 MG/3ML nebulizer solution  Take 1 ampule by nebulization 2 (two) times daily as needed. Patient not taking: Reported on 07/19/2022 05/15/22   [provider]  ALPRAZolam Duanne Moron) 0.5 MG tablet Take 0.5 mg by mouth at bedtime as needed for sleep. 10/18/20   [provider]  budesonide (PULMICORT) 0.25 MG/2ML nebulizer solution Take 0.25 mg by nebulization daily. Patient not taking: Reported on 07/19/2022 05/15/22   [provider]  budesonide-formoterol (SYMBICORT) 80-4.5 MCG/ACT inhaler Inhale 2 puffs into the lungs 2 (two) times daily as needed.    [provider]  furosemide (LASIX) 20 MG tablet Take 20 mg by mouth daily. Patient not taking:  Reported on 07/19/2022 04/21/22   [provider]  influenza vac split quadrivalent PF (FLUARIX QUADRIVALENT) 0.5 ML injection Inject into the muscle. Patient not taking: Reported on 07/19/2022 05/11/22   Carlyle Basques, MD  levocetirizine (XYZAL) 5 MG tablet Take 5 mg by mouth every evening. Patient not taking: Reported on 05/31/2022    [provider]  spironolactone (ALDACTONE) 25 MG tablet Take 1 tablet by mouth daily. Patient not taking: Reported on 07/19/2022 05/19/22 05/19/23  [provider]  TRELEGY ELLIPTA 100-62.5-25 MCG/ACT AEPB Inhale 1 puff into the lungs daily. Patient not taking: Reported on 07/19/2022    [provider]     Total care time: 34 minutes    Armando Reichert, MD [facility] 07/20/2022 3:52 PM

## 2022-07-20 NOTE — Assessment & Plan Note (Addendum)
In the setting of thoracentesis.  Initial chest x-ray with pneumothorax estimated to be 15% with gradual small increases with each subsequent chest x-ray.  At this time, patient is asymptomatic with no shortness of breath or hypoxia.  She is hemodynamically stable.  - Per IR, plans to place chest tube in the a.m. of 12/19 - Pulmonology consulted; message sent to Dr. Patsey Berthold as noted below - Chest x-ray ordered for 4 AM on 12/19 - Stat chest x-ray with any changes in hemodynamics or respirations

## 2022-07-20 NOTE — Assessment & Plan Note (Addendum)
EKG today with sinus tachycardia.  Patient denies any prior history of known tachycardia.  Per chart review, heart rates at outpatient visits typically in the 90s, however she did have sinus tachycardia when she had her Port-A-Cath removed.  In addition, patient notes that she decreased her home metoprolol to half dose as she was concerned it was hearing issues.    - Given pneumothorax, will monitor telemetry overnight to ensure improvement in heart rate.   - If any changes in heart rate noted, will need a stat chest x-ray - Restart home metoprolol at prior dose of 50 mg daily

## 2022-07-20 NOTE — Assessment & Plan Note (Signed)
Patient has a history of COPD with moderate obstruction noted on PFTs obtained on 05/2022. She is only using Symbicort PRN and not daily. Significant wheezing on examination today.   - Start maintenance bronchodilators scheduled - DuoNebs as needed

## 2022-07-20 NOTE — Assessment & Plan Note (Signed)
Per chart review, patient has a history of recurrent pleural effusions dating back to June 2023 s/p thoracentesis x 4 with most recent thoracentesis today.  Workup has largely been negative on etiology.  Cytology has always been negative for malignant cells. Prior cultures have been negative as well.   Pleural studies today meet Light's criteria for exudative nature.   Patient is requesting a second opinion; she was previously following with [facility] pulmonology but has an upcoming appointment with [facility] pulmonology.   - Inpatient consult to: Pulmonology; message sent to Dr. Patsey Berthold - Add body cell count to pleural studies obtained today

## 2022-07-20 NOTE — ED Notes (Signed)
Report given to IR nurse. Pt changed into gown. MD in room discussing plan with pt.

## 2022-07-20 NOTE — Progress Notes (Signed)
Interventional Radiology Progress Note  66 yo female SP right thoracentesis, with PTX.  Outpt.    Patient was observed overnight with serial CXR last night showing stable to slightly enlarged PTX   Patient remains with normal VS's: HR: 80-90's O2: 98% room air SBP: 156   CXR performed this am shows persisting hydropneumo, ex vacuo, improved gas component and not worsening. Her lower right lung is scarred with post therapy changes, and likely her lung will not expand completely.   She has upcoming appointment with Oncology in January.   We will schedule another thora in about 3 weeks from now as outpatient.   Discussed with her the need to return should she have any symptoms of SOB, chest pain, faintness, etc.  She understands.    Plan: - OK for DC from ED as above.   - we will schedule for thora in ~3 weeks    Signed,  Dulcy Fanny. Earleen Newport, DO

## 2022-07-20 NOTE — Progress Notes (Signed)
Interventional Radiology Progress Note  66 yo female SP right thoracentesis, with PTX.  Outpt.   Patient was observed overnight with serial CXR last night showing stable to slightly enlarged PTX  Patient with normal VS's: HR: 80-90's O2: 98% room air SBP: 156  She reports feeling better than before the thora.  She also reports an ex vacuo last time she had thora.  Plan: - proceed with CXR this am and reassess.  Certainly she looks good for DC now with knowledge of ex vacuo and no air leak  Signed,  Dulcy Fanny. Earleen Newport, DO

## 2022-07-20 NOTE — Progress Notes (Signed)
PROGRESS NOTE    Katherine BRIGANDI  Perry:[id number] DOB: 04-Feb-1956 DOA: 07/19/2022 PCP: Rusty Aus, MD  [id number]  LOS: 0 days   Brief hospital course:   Assessment & Plan: Katherine Perry is a 66 y.o. 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    * Pneumothorax, right-sided, large In the setting of thoracentesis.  Initial chest x-ray with pneumothorax estimated to be 15% with gradual small increases with each subsequent chest x-ray, currently considered large pneumothorax.  At this time, patient is asymptomatic with no shortness of breath or hypoxia.   --CXR performed this am showed persisting hydropneumo, ex vacuo, improved gas component and not worsening.  Per IR, chest tube not needed at this time. --pulm consulted Plan: --CXR q12h for monitoring  Recurrent pleural effusion on right she was previously following with [facility] pulmonology.  Per chart review, patient has a history of recurrent pleural effusions dating back to June 2023 s/p thoracentesis x 4 with most recent thoracentesis on 12/18 with 1.45L removed.  the differential includes the trapped/entrapped lung physiology noted above (leaning towards entrapped given its exudative nature), recurrent malignant pleural effusion, and a chylothorax given she is underwent radiation to the area near the thoracic duct.  --pulm consult with Dr. Genia Harold --options include recurrent thoracentesis versus consideration for an indwelling pleural catheter.  --cont pulm f/u in Jan 2024 (prescheduled visit initially for the second opinion).   COPD (chronic obstructive pulmonary disease) ([place]) Patient has a history of COPD with moderate obstruction noted on PFTs obtained on 05/2022. She is only using  Symbicort PRN and not daily.  --not acutely exacerbated. --cont Dulera (new) - DuoNebs as needed  Sinus tachycardia EKG today with sinus tachycardia.  Patient denies any prior history of known tachycardia.  Per chart review, heart rates at outpatient visits typically in the 90s, however she did have sinus tachycardia when she had her Port-A-Cath removed.  In addition, patient notes that she decreased her home metoprolol to half dose as she was concerned it was hearing issues.   - cont home metoprolol at prior dose of 50 mg daily  Malignant neoplasm of lower lobe of right lung [facility]) Patient has a history of stage III adenocarcinoma of the right lower lobe s/p chemotherapy, radiation and maintenance  --cont outpatient f/u with Dr. Janese Banks   DVT prophylaxis: SCD/Compression stockings Code Status: Full code  Family Communication: husband updated at bedside today Level of care: Telemetry Medical Dispo:   The patient is from: home Anticipated d/c is to: home Anticipated d/c date is: likely tomorrow   Subjective and Interval History:  Pt reported breathing much better after thoracentesis.  Per IR, no need for chest tube.  Pulm to see.   Objective: Vitals:   07/20/22 0715 07/20/22 0730 07/20/22 1000 07/20/22 1440  BP: (!) 142/69 138/67 (!) 143/79 (!) 146/83  Pulse: 91 84 95 (!) 102  Resp:   19 16  Temp:      TempSrc:      SpO2: 99% 98% 99% 99%   No intake or output data in the 24 hours ending 07/20/22 1645 There were no vitals filed for this visit.  Examination:   Constitutional: NAD, AAOx3 HEENT: conjunctivae and lids normal, EOMI CV: No cyanosis.   RESP: normal respiratory effort, on RA Neuro: II - XII  grossly intact.   Psych: Normal mood and affect.  Appropriate judgement and reason   Data Reviewed: I have personally reviewed labs and imaging studies  Time spent: 50 minutes  Enzo Bi, MD [facility] If 7PM-7AM, please contact night-coverage 07/20/2022, 4:45  PM

## 2022-07-21 ENCOUNTER — Observation Stay: Payer: BC Managed Care – PPO

## 2022-07-21 DIAGNOSIS — J95811 Postprocedural pneumothorax: Secondary | ICD-10-CM | POA: Diagnosis not present

## 2022-07-21 LAB — BASIC METABOLIC PANEL
Anion gap: 10 (ref 5–15)
BUN: 12 mg/dL (ref 8–23)
CO2: 24 mmol/L (ref 22–32)
Calcium: 9.6 mg/dL (ref 8.9–10.3)
Chloride: 102 mmol/L (ref 98–111)
Creatinine, Ser: 0.59 mg/dL (ref 0.44–1.00)
GFR, Estimated: >60 mL/min (ref >60)
Glucose, Bld: 127 mg/dL — ABNORMAL HIGH (ref 70–99)
Potassium: 3.9 mmol/L (ref 3.5–5.1)
Sodium: 136 mmol/L (ref 135–145)

## 2022-07-21 LAB — CBC
HCT: 39.4 % (ref 36.0–46.0)
Hemoglobin: 13.2 g/dL (ref 12.0–15.0)
MCH: 33.8 pg (ref 26.0–34.0)
MCHC: 33.5 g/dL (ref 30.0–36.0)
MCV: 101 fL — ABNORMAL HIGH (ref 80.0–100.0)
Platelets: 276 10*3/uL (ref 150–400)
RBC: 3.9 MIL/uL (ref 3.87–5.11)
RDW: 12.9 % (ref 11.5–15.5)
WBC: 5.2 10*3/uL (ref 4.0–10.5)
nRBC: 0 % (ref 0.0–0.2)

## 2022-07-21 LAB — TRIGLYCERIDES, BODY FLUIDS: Triglycerides, Fluid: 30 mg/dL

## 2022-07-21 LAB — CYTOLOGY - NON PAP

## 2022-07-21 LAB — MAGNESIUM: Magnesium: 1.9 mg/dL (ref 1.7–2.4)

## 2022-07-21 NOTE — Discharge Summary (Signed)
Physician Discharge Summary   Katherine Perry  female DOB: [birth year]  ATF:[id number]  PCP: Rusty Aus, MD  Admit date: 07/19/2022 Discharge date: 07/21/2022  Admitted From: home Disposition:  home CODE STATUS: Full code   Hospital Course:  For full details, please see H&P, progress notes, consult notes and ancillary notes.  Briefly,  Katherine Perry is a 66 y.o. 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   * Pneumothorax, right-sided, large In the setting of thoracentesis.  Initial chest x-ray with pneumothorax estimated to be 15% with gradual small increases with each subsequent chest x-ray, currently considered large pneumothorax.  At this time, patient is asymptomatic with no shortness of breath or hypoxia.   --CXR showed persisting hydropneumo, ex vacuo, improved gas component and not worsening.  Per IR, chest tube not needed at this time. --pulm consulted, recommended monitoring with serial CXR for another night, and cleared pt for discharge.   Recurrent pleural effusion on right she was previously following with [facility] pulmonology.  Per chart review, patient has a history of recurrent pleural effusions dating back to June 2023 s/p thoracentesis x 4 with most recent thoracentesis on 12/18 with 1.45L removed.   --pulm consulted with Dr. Genia Harold --cont pulm f/u in Jan 2024 (prescheduled visit initially for the second opinion).  --IR to scheduled outpatient thora in about 3 weeks.   COPD (chronic obstructive pulmonary disease) ([place]) Patient has a history of COPD with moderate obstruction noted on PFTs obtained on 05/2022. She is only using Symbicort PRN and not daily.  --not acutely exacerbated.   Sinus tachycardia EKG with sinus  tachycardia.  Patient denies any prior history of known tachycardia.  Per chart review, heart rates at outpatient visits typically in the 90s, however she did have sinus tachycardia when she had her Port-A-Cath removed.  In addition, patient notes that she decreased her home metoprolol to half dose.   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 Janese Banks   Unless noted above, medications under "STOP" list are ones pt was not taking PTA.  Discharge Diagnoses:  Principal Problem:   Pneumothorax Active Problems:   Recurrent pleural effusion on right   COPD (chronic obstructive pulmonary disease) (HCC)   Sinus tachycardia   Malignant neoplasm of lower lobe of right lung [facility])     Discharge Instructions:  Allergies as of 07/21/2022       Reactions   Ace Inhibitors Cough        Medication List     STOP taking these medications    albuterol 1.25 MG/3ML nebulizer solution Commonly known as: ACCUNEB   budesonide 0.25 MG/2ML nebulizer solution Commonly known as: PULMICORT   Fluarix Quadrivalent 0.5 ML injection Generic drug: influenza vac split quadrivalent PF   furosemide 20 MG tablet Commonly known as: LASIX   levocetirizine 5 MG tablet Commonly known as: XYZAL   spironolactone 25 MG tablet Commonly known as: ALDACTONE   Trelegy Ellipta 100-62.5-25 MCG/ACT Aepb Generic drug: Fluticasone-Umeclidin-Vilant       TAKE these medications    acetaminophen 500 MG tablet Commonly known  as: TYLENOL Take 500 mg by mouth every 6 (six) hours as needed.   ALPRAZolam 0.5 MG tablet Commonly known as: XANAX Take 0.5 mg by mouth at bedtime as needed for sleep.   azelastine 0.1 % nasal spray Commonly known as: ASTELIN Place 2 sprays into both nostrils 2 (two) times  daily. Use in each nostril as directed   Cholecalciferol 50 MCG (2000 UT) Caps Take 2,000 Units by mouth daily.   esomeprazole 20 MG capsule Commonly known as: NEXIUM Take 20 mg by mouth as needed.   fluticasone 50 MCG/ACT nasal spray Commonly known as: FLONASE Place 2 sprays into both nostrils daily as needed for rhinitis.   folic acid 1 MG tablet Commonly known as: FOLVITE TAKE 1 TABLET BY MOUTH EVERY DAY   metoprolol succinate 50 MG 24 hr tablet Commonly known as: TOPROL-XL Take 50 mg by mouth every morning.   montelukast 10 MG tablet Commonly known as: SINGULAIR Take 1 tablet by mouth at bedtime.   Symbicort 80-4.5 MCG/ACT inhaler Generic drug: budesonide-formoterol Inhale 2 puffs into the lungs 2 (two) times daily as needed.   torsemide 20 MG tablet Commonly known as: DEMADEX Take 20 mg by mouth daily.   zolpidem 10 MG tablet Commonly known as: AMBIEN Take 5 mg by mouth at bedtime as needed for sleep.          Allergies  Allergen Reactions   Ace Inhibitors Cough     The results of significant diagnostics from this hospitalization (including imaging, microbiology, ancillary and laboratory) are listed below for reference.   Consultations:   Procedures/Studies: DG Chest Port 1 View  Result Date: 07/21/2022 CLINICAL DATA:  Pneumothorax EXAM: PORTABLE CHEST 1 VIEW COMPARISON:  Yesterday FINDINGS: Right apical pneumothorax is similar to before at approximately 5 cm in the midline right chest. Pleural fluid at the right base with volume loss is unchanged. Clear left lung. Normal heart size. IMPRESSION: Unchanged moderate right hydropneumothorax. Electronically Signed   By: Jorje Guild M.D.   On: 07/21/2022 06:21   DG Chest Port 1 View  Result Date: 07/20/2022 CLINICAL DATA:  Pneumothorax EXAM: PORTABLE CHEST 1 VIEW COMPARISON:  Same day chest radiograph FINDINGS: The cardiomediastinal silhouette is stable. A right hydropneumothorax is again seen. The  right apical pneumothorax component is not significantly changed in size. The layering fluid component also grossly unchanged. There is no leftward mediastinal shift. The left lung is clear. There is no left pleural effusion or pneumothorax. There is no acute osseous abnormality. IMPRESSION: Unchanged right hydropneumothorax. Electronically Signed   By: Valetta Mole M.D.   On: 07/20/2022 20:12   DG Chest 2 View  Result Date: 07/20/2022 CLINICAL DATA:  66 year old female with ex vacuo pneumothorax after right-sided thoracentesis EXAM: CHEST - 2 VIEW COMPARISON:  Multiple prior, 07/19/2022 FINDINGS: Cardiomediastinal silhouette unchanged in size and contour. Persisting right-sided hydropneumothorax. Today's image demonstrates slight reduction in the gas component at the apex. Opacity in the hilar region compatible with scarred lung/treatment changes, characterized on recent PET-CT and CT Left lung relatively well aerated. IMPRESSION: Persisting right-sided hydropneumothorax, with decreasing gas component at the apex. Electronically Signed   By: Corrie Mckusick D.O.   On: 07/20/2022 09:24   US THORACENTESIS ASP PLEURAL SPACE W/IMG GUIDE  Result Date: 07/20/2022 INDICATION: Recurrent right pleural effusion EXAM: ULTRASOUND GUIDED RIGHT THORACENTESIS MEDICATIONS: 8 cc 1% lidocaine COMPLICATIONS: SIR LEVEL C - Requires therapy, minor hospitalization (<48 hrs). PROCEDURE: An ultrasound guided thoracentesis was thoroughly discussed with the patient  and questions answered. The benefits, risks, alternatives and complications were also discussed. The patient understands and wishes to proceed with the procedure. Written consent was obtained. Ultrasound was performed to localize and mark an adequate pocket of fluid in the right chest. The area was then prepped and draped in the normal sterile fashion. 1% Lidocaine was used for local anesthesia. Under ultrasound guidance a 19 gauge, 7-cm, Yueh catheter was introduced.  Thoracentesis was performed. The catheter was removed and a dressing applied. FINDINGS: A total of approximately 1.45 L of clear yellow fluid was removed. Samples were sent to the laboratory as requested by the clinical team. IMPRESSION: Successful ultrasound guided right thoracentesis yielding 1.45 L of pleural fluid. Serial follow-up chest x-rays revealed an enlarging pneumothorax status post thoracentesis. The patient is being admitted, and scheduled for tentative right thoracostomy and chest tube placement on 07/20/2022. Read by: Reatha Armour, PA-C Electronically Signed   By: Albin Felling M.D.   On: 07/20/2022 08:05   DG Chest 1 View  Result Date: 07/20/2022 CLINICAL DATA:  66 year old female status post ultrasound-guided right side thoracentesis yesterday afternoon. Residual pneumothorax. EXAM: CHEST  1 VIEW COMPARISON:  Chest radiographs 1913 hours yesterday and earlier. FINDINGS: Portable AP upright view at 0405 hours. Continued moderate sized right pneumothorax, apical pleural edge projects at the posterior 6th rib level (unchanged from last night.). Residual right pleural effusion (hydropneumothorax). Stable mediastinal contours. Ventilation not significantly changed. Left lung remains negative. Stable visualized osseous structures. Negative visible bowel gas. IMPRESSION: 1. Unchanged moderate volume right pneumothorax, hydropneumothorax since last night. 2. No new cardiopulmonary abnormality. Electronically Signed   By: Genevie Ann M.D.   On: 07/20/2022 04:18   DG Chest 2 View  Result Date: 07/19/2022 CLINICAL DATA:  Pneumothorax R side. Timed to ensure no progression EXAM: CHEST - 2 VIEW COMPARISON:  July 19, 2022 FINDINGS: The cardiomediastinal silhouette is unchanged in contour.Revisualization of a RIGHT-sided hydropneumothorax. Air component appears mildly increased in comparison to priors. Revisualization of RIGHT-sided atelectasis. Visualized abdomen is unremarkable. Mild degenerative  changes of the thoracic spine. IMPRESSION: Revisualization of a RIGHT-sided hydropneumothorax. Air component appears mildly increased in comparison to priors. Electronically Signed   By: Valentino Saxon M.D.   On: 07/19/2022 19:45   DG Chest Port 1 View  Result Date: 07/19/2022 CLINICAL DATA:  Follow-up pneumothorax EXAM: PORTABLE CHEST 1 VIEW COMPARISON:  07/19/2022, 05/21/2022 FINDINGS: Left lung grossly clear. Similar residual pleural effusion and right lower lobe collapse. Increased moderate right pneumothorax with greater apical medial, apicolateral and right basilar component compared to the prior radiograph. IMPRESSION: Known right pneumothorax appears slightly increased in size as compared with radiograph at 4:23 p.m. no midline shift. Similar residual right pleural effusion and right lower lobe collapse. Electronically Signed   By: Donavan Foil M.D.   On: 07/19/2022 17:54   DG Chest Port 1 View  Result Date: 07/19/2022 CLINICAL DATA:  Follow-up pneumothorax status post right thoracentesis EXAM: PORTABLE CHEST 1 VIEW COMPARISON:  Multiple priors FINDINGS: Cardiomediastinal silhouette is within normal limits. Moderate right pleural effusion. Similar appearance of apical right pneumothorax. There is now a new subpleural component with a more pronounced air-fluid level and likely unchanged right lower lobe collapse. The left lung remains clear. Osseous structures are unremarkable. IMPRESSION: There is more pronounced appearance of subpleural component of right-sided pneumothorax with likely unchanged right lower lobe collapse. Findings likely represent pneumothorax ex vacuo given history of similar findings on prior chest radiographs following thoracentesis. Patient states that she  feels similar to slightly improved following thoracentesis. Another chest radiograph will be obtained in 1 hour to ensure no further changes. Electronically Signed   By: Albin Felling M.D.   On: 07/19/2022 16:47   DG  Chest Port 1 View  Result Date: 07/19/2022 CLINICAL DATA:  Status post thoracentesis EXAM: PORTABLE CHEST 1 VIEW COMPARISON:  05/21/2022 FINDINGS: Mild cardiomegaly. Moderate right pleural effusion and associated atelectasis or consolidation. Small right apical pneumothorax, approximately 15% in volume. Left lung is normally aerated. Osseous structures unremarkable. IMPRESSION: 1. Small right apical pneumothorax, approximately 15% in volume, similar in volume to pneumothorax seen following prior thoracentesis dated 05/21/2022. 2. Moderate right pleural effusion and associated atelectasis or consolidation. 3. Mild cardiomegaly. 4. No new airspace opacity. These results will be called to the ordering clinician or representative by the Radiologist Assistant, and communication documented in the PACS or [employer]. Electronically Signed   By: Delanna Ahmadi M.D.   On: 07/19/2022 15:35   CT CHEST ABDOMEN PELVIS W CONTRAST  Result Date: 07/14/2022 CLINICAL DATA:  Lung cancer with fluid accumulation. Worsening fluid accumulation. * Tracking Code: BO * EXAM: CT CHEST, ABDOMEN, AND PELVIS WITH CONTRAST TECHNIQUE: Multidetector CT imaging of the chest, abdomen and pelvis was performed following the standard protocol during bolus administration of intravenous contrast. RADIATION DOSE REDUCTION: This exam was performed according to the departmental dose-optimization program which includes automated exposure control, adjustment of the mA and/or kV according to patient size and/or use of iterative reconstruction technique. CONTRAST:  165mL OMNIPAQUE IOHEXOL 300 MG/ML  SOLN COMPARISON:  PET-CT 04/13/2022. FINDINGS: CT CHEST FINDINGS Cardiovascular: The RIGHT lobe pulmonary arteries truncated as ti enters the consolidated RIGHT lobe lower lobe. No acute findings of the vascular structures. Mediastinum/Nodes: Small mediastinal lymph nodes similar prior. No supraclavicular adenopathy. No axillary adenopathy Lungs/Pleura:  Similar large volume layering RIGHT pleural effusion. There is collapse of the RIGHT lower lobe and RIGHT middle lobe. Poor enhancement of the collapsed rounded RIGHT lower lobe (image 36/2. Musculoskeletal: No aggressive osseous lesion. CT ABDOMEN AND PELVIS FINDINGS Hepatobiliary: No focal hepatic lesion. No biliary ductal dilatation. Gallbladder is normal. Common bile duct is normal. Pancreas: Pancreas is normal. No ductal dilatation. No pancreatic inflammation. Spleen: Normal spleen Adrenals/urinary tract: Adrenal glands and kidneys are normal. The ureters and bladder normal. Stomach/Bowel: Stomach, small bowel, appendix, and cecum are normal. The colon and rectosigmoid colon are normal. Vascular/Lymphatic: Abdominal aorta is normal caliber. There is no retroperitoneal or periportal lymphadenopathy. No pelvic lymphadenopathy. Reproductive: Uterus and adnexa unremarkable. Other: No free fluid. Musculoskeletal: No aggressive osseous lesion. IMPRESSION: Chest Impression: 1. No significant interval change from PET-CT 04/13/2022. 2. Large RIGHT pleural effusion with dense atelectasis the RIGHT lower lobe. Poor enhancement of the RIGHT lower lobe could represent superinfection of the RIGHT lower lobe. Cannot exclude neoplasm. Abdomen / Pelvis Impression: No evidence of metastatic disease in the abdomen pelvis. Electronically Signed   By: Suzy Bouchard M.D.   On: 07/14/2022 14:30      Labs: BNP (last 3 results) No results for input(s): "BNP" in the last 8760 hours. Basic Metabolic Panel: Recent Labs  Lab 07/19/22 1112 07/19/22 1818 07/20/22 0440 07/21/22 0525  NA 135 137 139 136  K 4.2 4.0 3.5 3.9  CL 98 101 106 102  CO2 24 25 25 24   GLUCOSE 88 98 98 127*  BUN 12 19 14 12   CREATININE 0.84 0.84 0.59 0.59  CALCIUM 9.5 10.1 9.6 9.6  MG  --   --   --  1.9   Liver Function Tests: Recent Labs  Lab 07/19/22 1112 07/19/22 1818  AST 30 30  ALT 20 21  ALKPHOS 121 118  BILITOT 0.5 0.7  PROT  8.4* 8.4*  ALBUMIN 4.4 4.4   No results for input(s): "LIPASE", "AMYLASE" in the last 168 hours. No results for input(s): "AMMONIA" in the last 168 hours. CBC: Recent Labs  Lab 07/19/22 1112 07/19/22 1818 07/20/22 0440 07/21/22 0525  WBC 4.5 6.2 4.5 5.2  NEUTROABS 3.0 4.4  --   --   HGB 13.5 13.7 11.9* 13.2  HCT 39.9 41.9 35.6* 39.4  MCV 99.5 101.7* 100.0 101.0*  PLT 292 313 262 276   Cardiac Enzymes: No results for input(s): "CKTOTAL", "CKMB", "CKMBINDEX", "TROPONINI" in the last 168 hours. BNP: Invalid input(s): "POCBNP" CBG: No results for input(s): "GLUCAP" in the last 168 hours. D-Dimer No results for input(s): "DDIMER" in the last 72 hours. Hgb A1c No results for input(s): "HGBA1C" in the last 72 hours. Lipid Profile No results for input(s): "CHOL", "HDL", "LDLCALC", "TRIG", "CHOLHDL", "LDLDIRECT" in the last 72 hours. Thyroid function studies No results for input(s): "TSH", "T4TOTAL", "T3FREE", "THYROIDAB" in the last 72 hours.  Invalid input(s): "FREET3" Anemia work up No results for input(s): "VITAMINB12", "FOLATE", "FERRITIN", "TIBC", "IRON", "RETICCTPCT" in the last 72 hours. Urinalysis No results found for: "COLORURINE", "APPEARANCEUR", "LABSPEC", "PHURINE", "GLUCOSEU", "HGBUR", "BILIRUBINUR", "KETONESUR", "PROTEINUR", "UROBILINOGEN", "NITRITE", "LEUKOCYTESUR" Sepsis Labs Recent Labs  Lab 07/19/22 1112 07/19/22 1818 07/20/22 0440 07/21/22 0525  WBC 4.5 6.2 4.5 5.2   Microbiology Recent Results (from the past 240 hour(s))  Body fluid culture w Gram Stain     Status: None   Collection Time: 07/19/22  3:32 PM   Specimen: PATH Cytology Pleural fluid  Result Value Ref Range Status   Specimen Description   Final    PLEURAL Performed at [facility], [address], [place], [place] [address]    Special Requests   Final    NONE Performed at [facility], [address] [place], [place] [address]    Gram Stain NO WBC SEEN NO  ORGANISMS SEEN   Final   Culture   Final    NO GROWTH 3 DAYS Performed at [facility], [place] [address] [place], [place] [address]    Report Status 07/23/2022 FINAL  Final     Total time spend on discharging this patient, including the last patient exam, discussing the hospital stay, instructions for ongoing care as it relates to all pertinent caregivers, as well as preparing the medical discharge records, prescriptions, and/or referrals as applicable, is 35 minutes.    Enzo Bi, MD  [facility] 07/23/2022, 6:12 PM

## 2022-07-22 ENCOUNTER — Inpatient Hospital Stay: Payer: BC Managed Care – PPO

## 2022-07-23 LAB — BODY FLUID CULTURE W GRAM STAIN
Culture: NO GROWTH
Gram Stain: NONE SEEN

## 2022-07-24 LAB — CHOLESTEROL, BODY FLUID: Cholesterol, Fluid: 107 mg/dL

## 2022-07-29 ENCOUNTER — Inpatient Hospital Stay (HOSPITAL_BASED_OUTPATIENT_CLINIC_OR_DEPARTMENT_OTHER): Payer: BC Managed Care – PPO | Admitting: Oncology

## 2022-07-29 ENCOUNTER — Encounter: Payer: Self-pay | Admitting: Oncology

## 2022-07-29 DIAGNOSIS — Z87891 Personal history of nicotine dependence: Secondary | ICD-10-CM

## 2022-07-29 DIAGNOSIS — J90 Pleural effusion, not elsewhere classified: Secondary | ICD-10-CM

## 2022-08-02 ENCOUNTER — Encounter: Payer: Self-pay | Admitting: Oncology

## 2022-08-02 IMAGING — PT NM PET TUM IMG INITIAL (PI) SKULL BASE T - THIGH
9 series · 21 of 25 positions shown · non-contrast
Comparison: CT 10/02/2020

CLINICAL DATA: Initial treatment strategy for RIGHT lower lobe lung
mass.

EXAM:
NUCLEAR MEDICINE PET SKULL BASE TO THIGH
TECHNIQUE: 8.7 mCi F-18 FDG was injected intravenously. Full-ring PET imaging
was performed from the skull base to thigh after the radiotracer. CT
data was obtained and used for attenuation correction and anatomic
localization.
Fasting blood glucose: 86 mg/dl

[Series 3: ct wb 5.0 b30f · axial · 5.0mm · 0.98mm/px · z∈[-172,+404]mm · 3 of 290 slices shown]
[im 1/290]
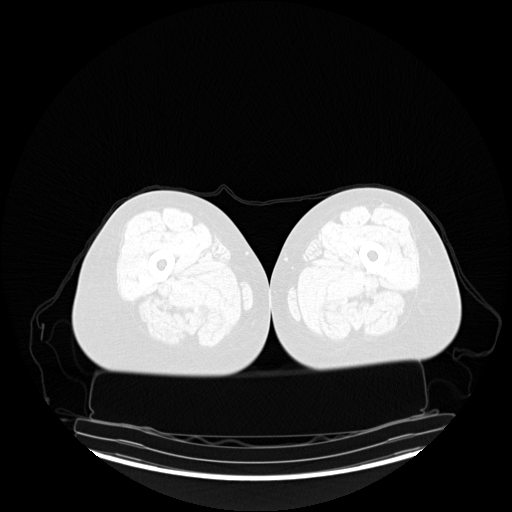
[im 97/290]
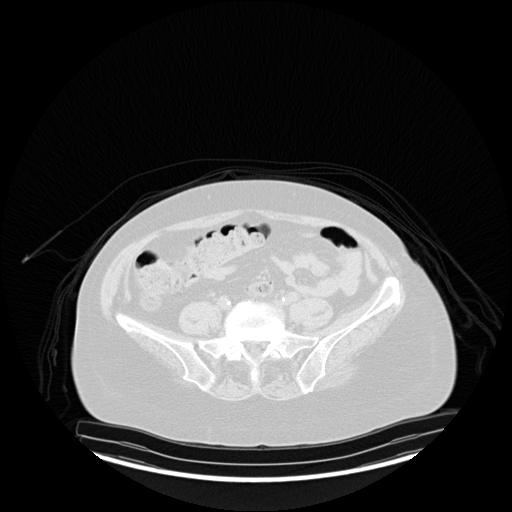
[im 193/290]
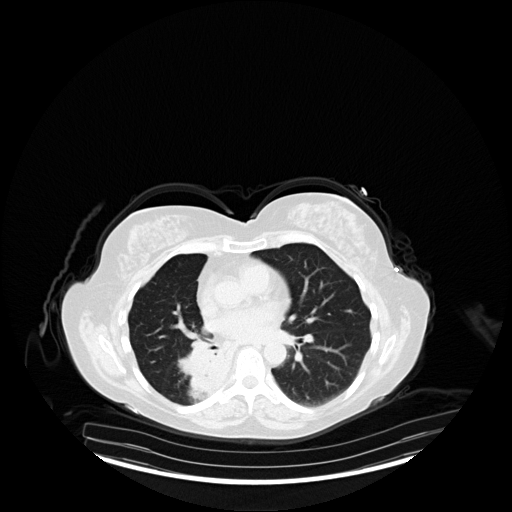

[Series 5: pet wb uncorrected (nac) · axial · 5.0mm · 4.07mm/px · z∈[-172,+694]mm · 4 of 290 slices shown]
[im 1/290]
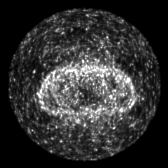
[im 97/290]
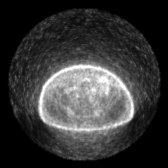
[im 193/290]
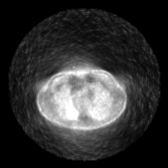
[im 290/290]
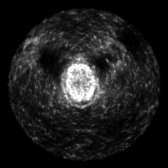

[Series 603: pet_ct axial fused · 3 of 288 slices shown]
[im 1/288]
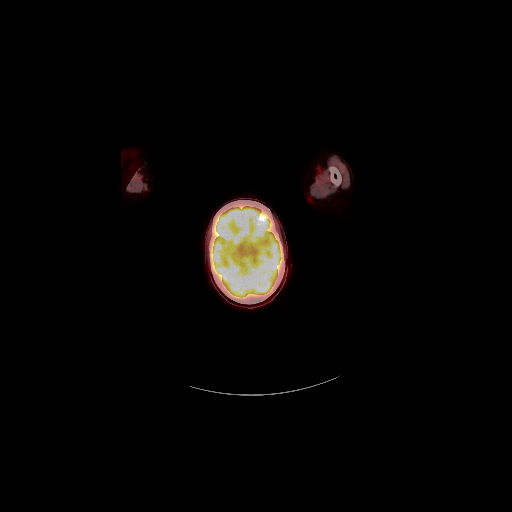
[im 192/288]
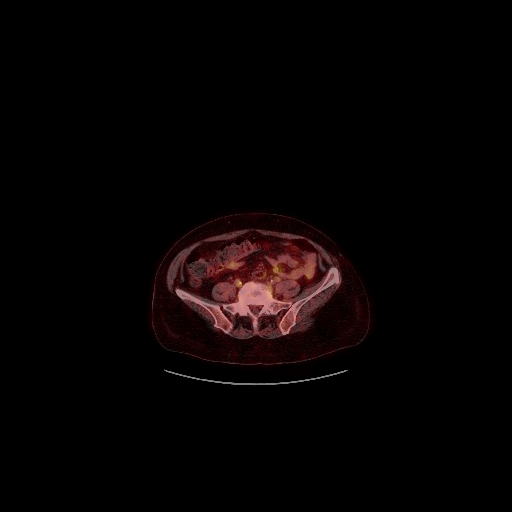
[im 288/288]
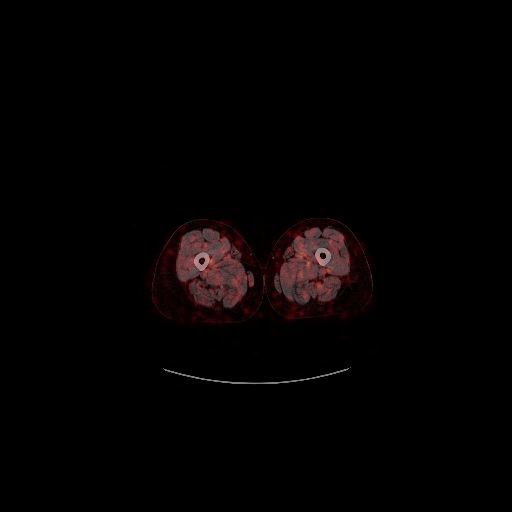

[Series 604: pet_ct coronal fused · 2 of 107 slices shown]
[im 1/107]
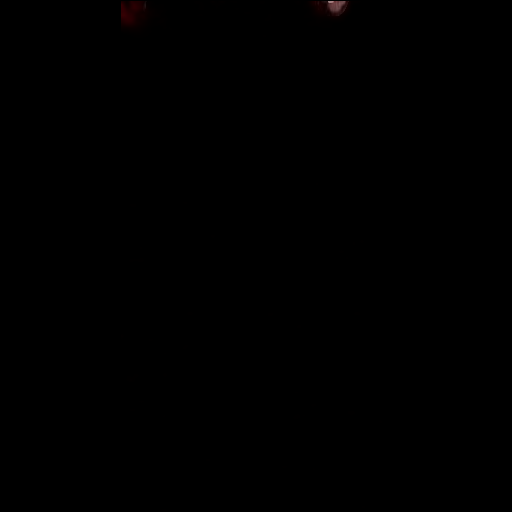
[im 107/107]
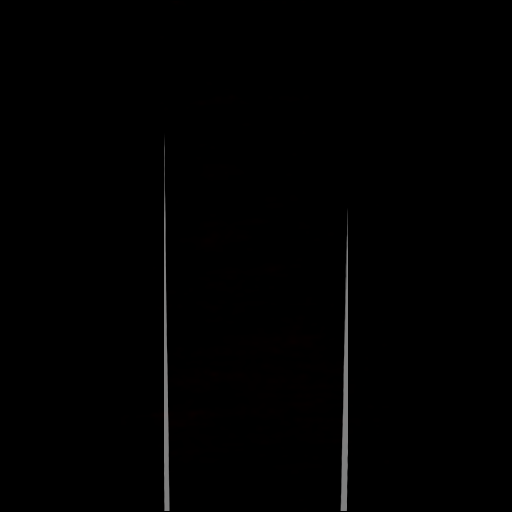

[Series 605: pet_ct sagittal fused · 1 of 148 slices shown]
[im 1/148]
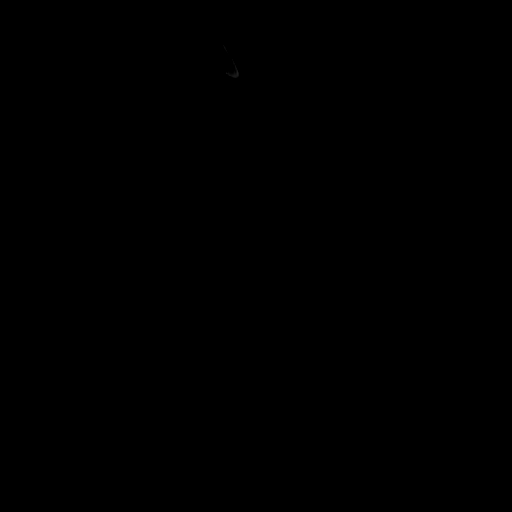

[Series 606: pet axial · 4 of 286 slices shown]
[im 1/286]
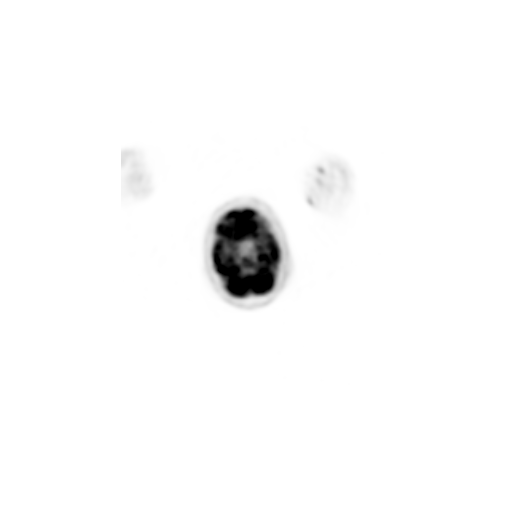
[im 96/286]
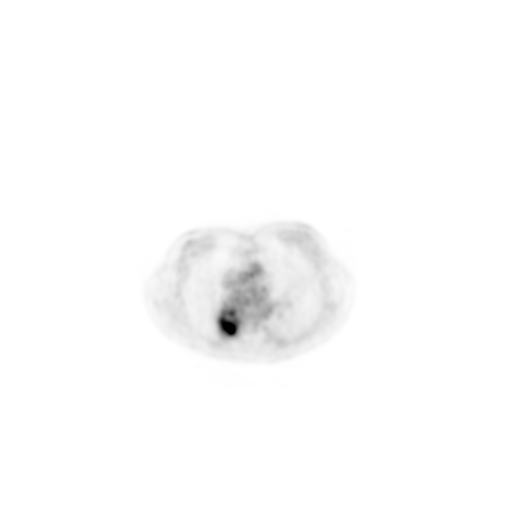
[im 191/286]
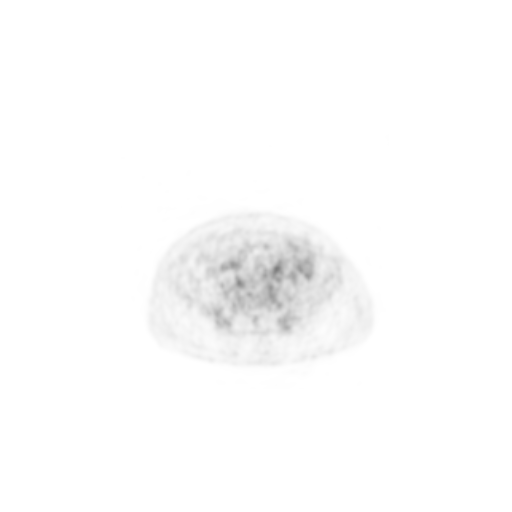
[im 286/286]
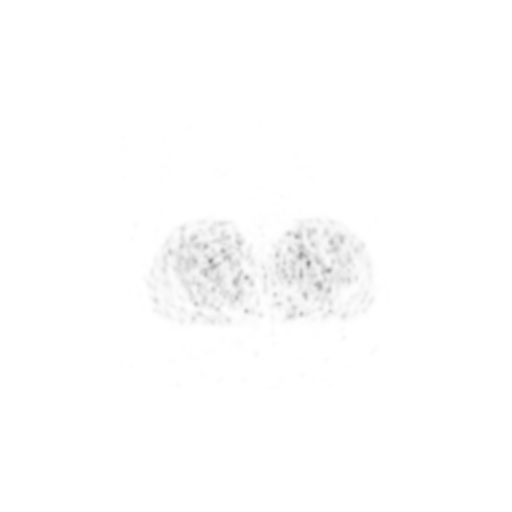

[Series 607: pet coronal · 1 of 122 slices shown]
[im 1/122]
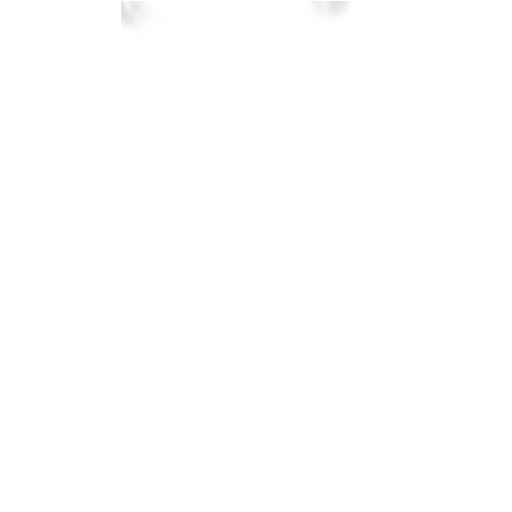

[Series 608: pet sagittal · 2 of 151 slices shown]
[im 1/151]
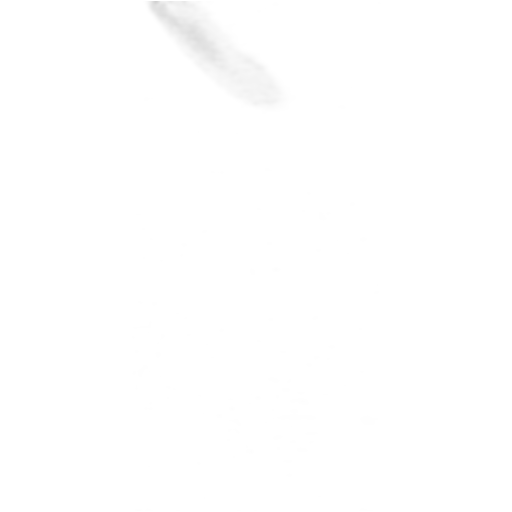
[im 151/151]
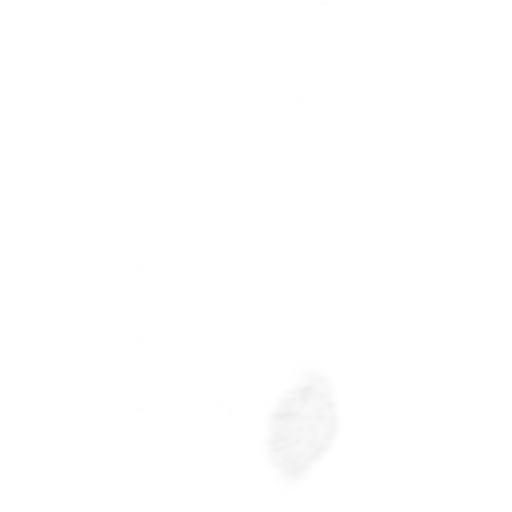

[Series 1114: results mm oncology reading · 1.0mm · 1.00mm/px · 1 of 3 slices shown]
[im 1/3]
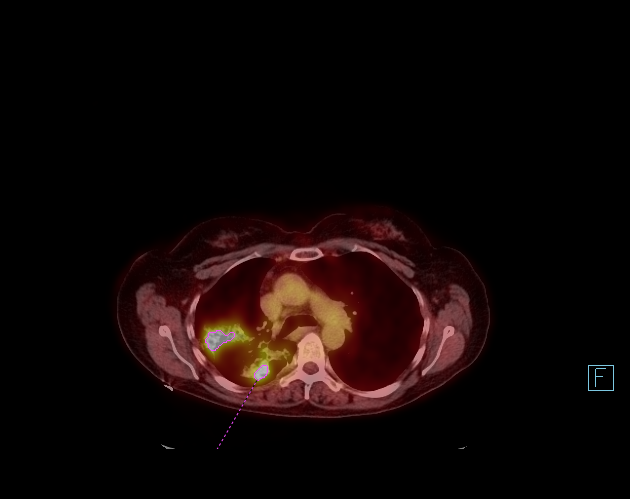

[21 of 25 positions shown; findings below may reference images not displayed]

FINDINGS: Mediastinal blood pool activity: SUV max

Liver activity: SUV max NA

NECK: Consolidation within the medial aspect of the RIGHT lower lobe
extending from the hilum the lung base is similar morphology in
short interval follow-up measuring 3.4 by 4.0 cm in axial dimension
compared to 3.3 by 4.1 cm on prior. This consolidation is intensely
hypermetabolic with SUV max equal 11.6.

There is hypermetabolic masslike thickening at the RIGHT hilum
measuring 2.2 cm (image 94) also with intense metabolic activity.

The ground-glass airspace disease/consolidation in the posterior
aspect of the RIGHT upper lobe is unchanged morphology and also has
intense metabolic activity SUV max equal 11.4.

No hypermetabolic mediastinal lymph nodes. No hypermetabolic
supraclavicular nodes.

Incidental CT findings: none

CHEST:

Incidental CT findings: none

ABDOMEN/PELVIS: No abnormal hypermetabolic activity within the
liver, pancreas, adrenal glands, or spleen. No hypermetabolic lymph
nodes in the abdomen or pelvis.

Incidental CT findings: None

SKELETON: No focal hypermetabolic activity to suggest skeletal
metastasis.

Incidental CT findings: none
IMPRESSION: 1. Hypermetabolic consolidative mass in the medial aspect of the
RIGHT lower lobe extending from the hilum to the lung base.
Hypermetabolic LEFT hilar mass. Findings remain highly concerning
for bronchogenic carcinoma although pulmonary infection could have
similar pattern. No significant change from CT 10/02/2020.
[DATE]. Band intensely hypermetabolic ground-glass density in the RIGHT
upper lobe with differential including pulmonary infection versus
lymphangitic spread of carcinoma.
3. Consider bronchoscopy for evaluation of the RIGHT lower lobe
pulmonary mass.
4. No hypermetabolic mediastinal lymph nodes or distant malignancy.

## 2022-08-02 NOTE — Progress Notes (Signed)
I connected with Katherine Perry on 08/02/22 at 10:00 AM EST by video enabled telemedicine visit and verified that I am speaking with the correct person using two identifiers.   I discussed the limitations, risks, security and privacy concerns of performing an evaluation and management service by telemedicine and the availability of in-person appointments. I also discussed with the patient that there may be a patient responsible charge related to this service. The patient expressed understanding and agreed to proceed.  Other persons participating in the visit and their role in the encounter:  none  Patient's location:  home Provider's location:  home  Chief Complaint:  post hospital discharge f/u  History of present illness: patient is a 67 year old female with a past medical history significant for 1-1/2 pack/day smoking for about 20 years.  She quit smoking about 26 years ago.  Other medical problems include hypertension and hyponatremia.She has been treated for iron of possible pneumonia for the last 1 year.  She has a history of intermittent asthma for which she uses Advair.  She was seen by Dr. And underwent CT chest which showed dense infiltrate/solid mass in the right lower lobe with contiguous airspace opacity in the right upper lobe.  Findings could be secondary to pneumonia but concerning for bronchogenic carcinoma.  Patient then had a PET CT scan which showed consolidation in the medial aspect of the right lower lobe measuring 3.4 x 4 cm with an SUV of 11.6.  Hypermetabolic masslike thickening in the right hilum measuring 2.2 cm with intense hypermetabolic activity.  Groundglass airspace consolidation in the posterior aspect of the right upper lobe with an SUV of 11.4.  The right upper lobe opacity was nonspecific and differentials include pulmonary infection versus lymphangitic spread of carcinoma.  CT chest showed showed masslike area of distortion involving right lower lobe middle lobe as well  as right upper lobe.  Generalized right hilar masslike appearance concerning for nodal involvement.   Patient underwent bronchoscopy and biopsies.Right lower lobe was concerning for adenocarcinoma.  Lymph node station 11 R, 4R, 7 were negative for malignancy.  No malignant cells identified in the right upper lobe.  Case was discussed at tumor board and given the hypermetabolism in the right upper lobe as well as hilum involvement cannot be ruled out despite negative biopsies.  Patient completed concurrent chemoradiation with weekly CarboTaxol in July 2022.  Scan showed partial response.  Maintenance durvalumab completed in July 2023  Interval history patient feels breathing has improved after recent thoracentesis   Review of Systems  Constitutional:  Negative for chills, fever, malaise/fatigue and weight loss.  HENT:  Negative for congestion, ear discharge and nosebleeds.   Eyes:  Negative for blurred vision.  Respiratory:  Negative for cough, hemoptysis, sputum production, shortness of breath and wheezing.   Cardiovascular:  Negative for chest pain, palpitations, orthopnea and claudication.  Gastrointestinal:  Negative for abdominal pain, blood in stool, constipation, diarrhea, heartburn, melena, nausea and vomiting.  Genitourinary:  Negative for dysuria, flank pain, frequency, hematuria and urgency.  Musculoskeletal:  Negative for back pain, joint pain and myalgias.  Skin:  Negative for rash.  Neurological:  Negative for dizziness, tingling, focal weakness, seizures, weakness and headaches.  Endo/Heme/Allergies:  Does not bruise/bleed easily.  Psychiatric/Behavioral:  Negative for depression and suicidal ideas. The patient does not have insomnia.     Allergies  Allergen Reactions   Ace Inhibitors Cough    Past Medical History:  Diagnosis Date   Anxiety    Asthma  Cancer (HCC)    Cough    Dyspnea    with exertion   GERD (gastroesophageal reflux disease)    Hypertension    Lung  cancer [facility])     Past Surgical History:  Procedure Laterality Date   BUNIONECTOMY Bilateral    COLONOSCOPY     COLONOSCOPY WITH PROPOFOL N/A 07/30/2021   Procedure: COLONOSCOPY WITH PROPOFOL;  Surgeon: Annamaria Helling, DO;  Location: [facility] ENDOSCOPY;  Service: Gastroenterology;  Laterality: N/A;   ESOPHAGOGASTRODUODENOSCOPY  04/01/2006   PORTA CATH INSERTION N/A 12/22/2020   Procedure: PORTA CATH INSERTION;  Surgeon: Algernon Huxley, MD;  Location: [place] [facility];  Service: Cardiovascular;  Laterality: N/A;   PORTA CATH REMOVAL N/A 05/31/2022   Procedure: PORTA CATH REMOVAL;  Surgeon: Algernon Huxley, MD;  Location: [place] [facility];  Service: Cardiovascular;  Laterality: N/A;   VIDEO BRONCHOSCOPY WITH ENDOBRONCHIAL NAVIGATION N/A 11/07/2020   Procedure: VIDEO BRONCHOSCOPY WITH ENDOBRONCHIAL NAVIGATION;  Surgeon: Ottie Glazier, MD;  Location: [facility] ORS;  Service: Thoracic;  Laterality: N/A;   VIDEO BRONCHOSCOPY WITH ENDOBRONCHIAL ULTRASOUND N/A 11/07/2020   Procedure: VIDEO BRONCHOSCOPY WITH ENDOBRONCHIAL ULTRASOUND;  Surgeon: Ottie Glazier, MD;  Location: [facility] ORS;  Service: Thoracic;  Laterality: N/A;    Social History   Socioeconomic History   Marital status: Married    Spouse name: Not on file   Number of children: Not on file   Years of education: Not on file   Highest education level: Not on file  Occupational History   Not on file  Tobacco Use   Smoking status: Former    Packs/day: 1.50    Years: 26.80    Total pack years: 40.20    Types: Cigarettes    Quit date: 11/07/1994    Years since quitting: 27.7   Smokeless tobacco: Never  Vaping Use   Vaping Use: Never used  Substance and Sexual Activity   Alcohol use: Yes    Comment:  wine daily   Drug use: Never   Sexual activity: Yes  Other Topics Concern   Not on file  Social History Narrative   Lives with husband, Thayer Jew, no pets.   Social Determinants of Health   Financial Resource Strain: Not on  file  Food Insecurity: Not on file  Transportation Needs: Not on file  Physical Activity: Not on file  Stress: Not on file  Social Connections: Not on file  Intimate Partner Violence: Not on file    Family History  Problem Relation Age of Onset   Breast cancer Neg Hx      Current Outpatient Medications:    acetaminophen (TYLENOL) 500 MG tablet, Take 500 mg by mouth every 6 (six) hours as needed., Disp: , Rfl:    ALPRAZolam (XANAX) 0.5 MG tablet, Take 0.5 mg by mouth at bedtime as needed for sleep., Disp: , Rfl:    azelastine (ASTELIN) 0.1 % nasal spray, Place 2 sprays into both nostrils 2 (two) times daily. Use in each nostril as directed, Disp: 30 mL, Rfl: 2   budesonide-formoterol (SYMBICORT) 80-4.5 MCG/ACT inhaler, Inhale 2 puffs into the lungs 2 (two) times daily as needed., Disp: , Rfl:    Cholecalciferol 50 MCG (2000 UT) CAPS, Take 2,000 Units by mouth daily., Disp: , Rfl:    esomeprazole (NEXIUM) 20 MG capsule, Take 20 mg by mouth as needed., Disp: , Rfl:    fluticasone (FLONASE) 50 MCG/ACT nasal spray, Place 2 sprays into both nostrils daily as needed for rhinitis.,  Disp: , Rfl:    folic acid (FOLVITE) 1 MG tablet, TAKE 1 TABLET BY MOUTH EVERY DAY, Disp: 90 tablet, Rfl: 3   metoprolol succinate (TOPROL-XL) 50 MG 24 hr tablet, Take 50 mg by mouth every morning., Disp: , Rfl:    montelukast (SINGULAIR) 10 MG tablet, Take 1 tablet by mouth at bedtime., Disp: , Rfl:    torsemide (DEMADEX) 20 MG tablet, Take 20 mg by mouth daily., Disp: , Rfl:    zolpidem (AMBIEN) 10 MG tablet, Take 5 mg by mouth at bedtime as needed for sleep., Disp: , Rfl:  No current facility-administered medications for this visit.  Facility-Administered Medications Ordered in Other Visits:    heparin lock flush 100 UNIT/ML injection, , , ,    heparin lock flush 100 UNIT/ML injection, , , ,    heparin lock flush 100 UNIT/ML injection, , , ,    heparin lock flush 100 UNIT/ML injection, , , ,    heparin lock  flush 100 unit/mL, 500 Units, Intravenous, Once, Sindy Guadeloupe, MD   sodium chloride flush (NS) 0.9 % injection 10 mL, 10 mL, Intravenous, Once, Sindy Guadeloupe, MD   sodium chloride flush (NS) 0.9 % injection 10 mL, 10 mL, Intravenous, PRN, Sindy Guadeloupe, MD, 10 mL at 01/26/21 0807   sodium chloride flush (NS) 0.9 % injection 10 mL, 10 mL, Intravenous, Once, Sindy Guadeloupe, MD  DG Chest Port 1 View  Result Date: 07/21/2022 CLINICAL DATA:  Pneumothorax EXAM: PORTABLE CHEST 1 VIEW COMPARISON:  Yesterday FINDINGS: Right apical pneumothorax is similar to before at approximately 5 cm in the midline right chest. Pleural fluid at the right base with volume loss is unchanged. Clear left lung. Normal heart size. IMPRESSION: Unchanged moderate right hydropneumothorax. Electronically Signed   By: Jorje Guild M.D.   On: 07/21/2022 06:21   DG Chest Port 1 View  Result Date: 07/20/2022 CLINICAL DATA:  Pneumothorax EXAM: PORTABLE CHEST 1 VIEW COMPARISON:  Same day chest radiograph FINDINGS: The cardiomediastinal silhouette is stable. A right hydropneumothorax is again seen. The right apical pneumothorax component is not significantly changed in size. The layering fluid component also grossly unchanged. There is no leftward mediastinal shift. The left lung is clear. There is no left pleural effusion or pneumothorax. There is no acute osseous abnormality. IMPRESSION: Unchanged right hydropneumothorax. Electronically Signed   By: Valetta Mole M.D.   On: 07/20/2022 20:12   DG Chest 2 View  Result Date: 07/20/2022 CLINICAL DATA:  67 year old female with ex vacuo pneumothorax after right-sided thoracentesis EXAM: CHEST - 2 VIEW COMPARISON:  Multiple prior, 07/19/2022 FINDINGS: Cardiomediastinal silhouette unchanged in size and contour. Persisting right-sided hydropneumothorax. Today's image demonstrates slight reduction in the gas component at the apex. Opacity in the hilar region compatible with scarred  lung/treatment changes, characterized on recent PET-CT and CT Left lung relatively well aerated. IMPRESSION: Persisting right-sided hydropneumothorax, with decreasing gas component at the apex. Electronically Signed   By: Corrie Mckusick D.O.   On: 07/20/2022 09:24   US THORACENTESIS ASP PLEURAL SPACE W/IMG GUIDE  Result Date: 07/20/2022 INDICATION: Recurrent right pleural effusion EXAM: ULTRASOUND GUIDED RIGHT THORACENTESIS MEDICATIONS: 8 cc 1% lidocaine COMPLICATIONS: SIR LEVEL C - Requires therapy, minor hospitalization (<48 hrs). PROCEDURE: An ultrasound guided thoracentesis was thoroughly discussed with the patient and questions answered. The benefits, risks, alternatives and complications were also discussed. The patient understands and wishes to proceed with the procedure. Written consent was obtained. Ultrasound was performed to localize  and mark an adequate pocket of fluid in the right chest. The area was then prepped and draped in the normal sterile fashion. 1% Lidocaine was used for local anesthesia. Under ultrasound guidance a 19 gauge, 7-cm, Yueh catheter was introduced. Thoracentesis was performed. The catheter was removed and a dressing applied. FINDINGS: A total of approximately 1.45 L of clear yellow fluid was removed. Samples were sent to the laboratory as requested by the clinical team. IMPRESSION: Successful ultrasound guided right thoracentesis yielding 1.45 L of pleural fluid. Serial follow-up chest x-rays revealed an enlarging pneumothorax status post thoracentesis. The patient is being admitted, and scheduled for tentative right thoracostomy and chest tube placement on 07/20/2022. Read by: Reatha Armour, PA-C Electronically Signed   By: Albin Felling M.D.   On: 07/20/2022 08:05   DG Chest 1 View  Result Date: 07/20/2022 CLINICAL DATA:  67 year old female status post ultrasound-guided right side thoracentesis yesterday afternoon. Residual pneumothorax. EXAM: CHEST  1 VIEW COMPARISON:   Chest radiographs 1913 hours yesterday and earlier. FINDINGS: Portable AP upright view at 0405 hours. Continued moderate sized right pneumothorax, apical pleural edge projects at the posterior 6th rib level (unchanged from last night.). Residual right pleural effusion (hydropneumothorax). Stable mediastinal contours. Ventilation not significantly changed. Left lung remains negative. Stable visualized osseous structures. Negative visible bowel gas. IMPRESSION: 1. Unchanged moderate volume right pneumothorax, hydropneumothorax since last night. 2. No new cardiopulmonary abnormality. Electronically Signed   By: Genevie Ann M.D.   On: 07/20/2022 04:18   DG Chest 2 View  Result Date: 07/19/2022 CLINICAL DATA:  Pneumothorax R side. Timed to ensure no progression EXAM: CHEST - 2 VIEW COMPARISON:  July 19, 2022 FINDINGS: The cardiomediastinal silhouette is unchanged in contour.Revisualization of a RIGHT-sided hydropneumothorax. Air component appears mildly increased in comparison to priors. Revisualization of RIGHT-sided atelectasis. Visualized abdomen is unremarkable. Mild degenerative changes of the thoracic spine. IMPRESSION: Revisualization of a RIGHT-sided hydropneumothorax. Air component appears mildly increased in comparison to priors. Electronically Signed   By: Valentino Saxon M.D.   On: 07/19/2022 19:45   DG Chest Port 1 View  Result Date: 07/19/2022 CLINICAL DATA:  Follow-up pneumothorax EXAM: PORTABLE CHEST 1 VIEW COMPARISON:  07/19/2022, 05/21/2022 FINDINGS: Left lung grossly clear. Similar residual pleural effusion and right lower lobe collapse. Increased moderate right pneumothorax with greater apical medial, apicolateral and right basilar component compared to the prior radiograph. IMPRESSION: Known right pneumothorax appears slightly increased in size as compared with radiograph at 4:23 p.m. no midline shift. Similar residual right pleural effusion and right lower lobe collapse. Electronically  Signed   By: Donavan Foil M.D.   On: 07/19/2022 17:54   DG Chest Port 1 View  Result Date: 07/19/2022 CLINICAL DATA:  Follow-up pneumothorax status post right thoracentesis EXAM: PORTABLE CHEST 1 VIEW COMPARISON:  Multiple priors FINDINGS: Cardiomediastinal silhouette is within normal limits. Moderate right pleural effusion. Similar appearance of apical right pneumothorax. There is now a new subpleural component with a more pronounced air-fluid level and likely unchanged right lower lobe collapse. The left lung remains clear. Osseous structures are unremarkable. IMPRESSION: There is more pronounced appearance of subpleural component of right-sided pneumothorax with likely unchanged right lower lobe collapse. Findings likely represent pneumothorax ex vacuo given history of similar findings on prior chest radiographs following thoracentesis. Patient states that she feels similar to slightly improved following thoracentesis. Another chest radiograph will be obtained in 1 hour to ensure no further changes. Electronically Signed   By: Scherrie Bateman.D.  On: 07/19/2022 16:47   DG Chest Port 1 View  Result Date: 07/19/2022 CLINICAL DATA:  Status post thoracentesis EXAM: PORTABLE CHEST 1 VIEW COMPARISON:  05/21/2022 FINDINGS: Mild cardiomegaly. Moderate right pleural effusion and associated atelectasis or consolidation. Small right apical pneumothorax, approximately 15% in volume. Left lung is normally aerated. Osseous structures unremarkable. IMPRESSION: 1. Small right apical pneumothorax, approximately 15% in volume, similar in volume to pneumothorax seen following prior thoracentesis dated 05/21/2022. 2. Moderate right pleural effusion and associated atelectasis or consolidation. 3. Mild cardiomegaly. 4. No new airspace opacity. These results will be called to the ordering clinician or representative by the Radiologist Assistant, and communication documented in the PACS or [employer]. Electronically  Signed   By: Delanna Ahmadi M.D.   On: 07/19/2022 15:35   CT CHEST ABDOMEN PELVIS W CONTRAST  Result Date: 07/14/2022 CLINICAL DATA:  Lung cancer with fluid accumulation. Worsening fluid accumulation. * Tracking Code: BO * EXAM: CT CHEST, ABDOMEN, AND PELVIS WITH CONTRAST TECHNIQUE: Multidetector CT imaging of the chest, abdomen and pelvis was performed following the standard protocol during bolus administration of intravenous contrast. RADIATION DOSE REDUCTION: This exam was performed according to the departmental dose-optimization program which includes automated exposure control, adjustment of the mA and/or kV according to patient size and/or use of iterative reconstruction technique. CONTRAST:  146mL OMNIPAQUE IOHEXOL 300 MG/ML  SOLN COMPARISON:  PET-CT 04/13/2022. FINDINGS: CT CHEST FINDINGS Cardiovascular: The RIGHT lobe pulmonary arteries truncated as ti enters the consolidated RIGHT lobe lower lobe. No acute findings of the vascular structures. Mediastinum/Nodes: Small mediastinal lymph nodes similar prior. No supraclavicular adenopathy. No axillary adenopathy Lungs/Pleura: Similar large volume layering RIGHT pleural effusion. There is collapse of the RIGHT lower lobe and RIGHT middle lobe. Poor enhancement of the collapsed rounded RIGHT lower lobe (image 36/2. Musculoskeletal: No aggressive osseous lesion. CT ABDOMEN AND PELVIS FINDINGS Hepatobiliary: No focal hepatic lesion. No biliary ductal dilatation. Gallbladder is normal. Common bile duct is normal. Pancreas: Pancreas is normal. No ductal dilatation. No pancreatic inflammation. Spleen: Normal spleen Adrenals/urinary tract: Adrenal glands and kidneys are normal. The ureters and bladder normal. Stomach/Bowel: Stomach, small bowel, appendix, and cecum are normal. The colon and rectosigmoid colon are normal. Vascular/Lymphatic: Abdominal aorta is normal caliber. There is no retroperitoneal or periportal lymphadenopathy. No pelvic lymphadenopathy.  Reproductive: Uterus and adnexa unremarkable. Other: No free fluid. Musculoskeletal: No aggressive osseous lesion. IMPRESSION: Chest Impression: 1. No significant interval change from PET-CT 04/13/2022. 2. Large RIGHT pleural effusion with dense atelectasis the RIGHT lower lobe. Poor enhancement of the RIGHT lower lobe could represent superinfection of the RIGHT lower lobe. Cannot exclude neoplasm. Abdomen / Pelvis Impression: No evidence of metastatic disease in the abdomen pelvis. Electronically Signed   By: Suzy Bouchard M.D.   On: 07/14/2022 14:30    No images are attached to the encounter.      Latest Ref Rng & Units 07/21/2022    5:25 AM  CMP  Glucose 70 - 99 mg/dL 127   BUN 8 - 23 mg/dL 12   Creatinine 0.44 - 1.00 mg/dL 0.59   Sodium 135 - 145 mmol/L 136   Potassium 3.5 - 5.1 mmol/L 3.9   Chloride 98 - 111 mmol/L 102   CO2 22 - 32 mmol/L 24   Calcium 8.9 - 10.3 mg/dL 9.6       Latest Ref Rng & Units 07/21/2022    5:25 AM  CBC  WBC 4.0 - 10.5 K/uL 5.2   Hemoglobin 12.0 -  15.0 g/dL 13.2   Hematocrit 36.0 - 46.0 % 39.4   Platelets 150 - 400 K/uL 276      Observation/objective:appears in no acute distress over video visit today. Breathing is non labored  Assessment and plan:Patient is a 67 y.o. 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  She is s/p concurrent CarboTaxol chemotherapy followed by 1 year of maintenance durvalumab ending in July 2023.  This is a posthospital discharge follow-up visit  Patient was admitted to the hospital for pneumothorax after she had a thoracentesis.  Pleural fluid cytology was again negative for malignancy.  Examination was also negative for chylothorax.  She was seen by Dr. Genia Harold while she was in the hospital as well.  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  Cytology has been negative for 4-5 times now and I do not think that we are dealing with a malignant pleural effusion at this time.  We also reviewed her images at tumor board and there is no overt concern for tumor progression when we look at her recent scans.  Pleural biopsy may be too invasive and may not give [place] additional answers.  She is agreeable to pursuing thoracentesis as needed when she feels short of breath.  Given that the pleural fluid does not accumulate that frequently she does not require a Pleurx catheter at this time.  I will defer any decisions for bronchoscopy to pulmonary.  Patient would like to switch her care from [facility] to Dr.Dgayli.  I have informed him of her wishes.  She will keep her appointment with me in 4 months time with repeat scans.  Follow-up instructions: As above  I discussed the assessment and treatment plan with the patient. The patient was provided an opportunity to ask questions and all were answered. The patient agreed with the plan and demonstrated an understanding of the instructions.   The patient was advised to call back or seek an in-person evaluation if the symptoms worsen or if the condition fails to improve as anticipated.  I provided 14 minutes of face-to-face video visit time during this encounter, and > 50% was spent counseling as documented under my assessment & plan.  Visit Diagnosis: 1. Pleural effusion on right     Dr. Randa Evens, MD, MPH [facility] Tel- [phone] 08/02/2022 6:23 PM

## 2022-08-09 ENCOUNTER — Ambulatory Visit
Admission: RE | Admit: 2022-08-09 | Discharge: 2022-08-09 | Disposition: A | Discharge status: Home / Self Care | Payer: BC Managed Care – PPO | Source: Ambulatory Visit | Attending: Student | Admitting: Student

## 2022-08-09 ENCOUNTER — Encounter: Payer: Self-pay | Admitting: Oncology

## 2022-08-09 ENCOUNTER — Ambulatory Visit
Admission: RE | Admit: 2022-08-09 | Discharge: 2022-08-09 | Disposition: A | Discharge status: Home / Self Care | Payer: BC Managed Care – PPO | Source: Ambulatory Visit | Attending: Oncology | Admitting: Oncology

## 2022-08-09 DIAGNOSIS — J939 Pneumothorax, unspecified: Secondary | ICD-10-CM | POA: Diagnosis present

## 2022-08-09 DIAGNOSIS — J90 Pleural effusion, not elsewhere classified: Secondary | ICD-10-CM | POA: Insufficient documentation

## 2022-08-09 NOTE — Progress Notes (Signed)
Patient presented today for scheduled right-sided thoracentesis. Patient has history of pneumothorax ex vacuo following prior thoracentesis procedures on 07/19/22 and 05/21/22. Given these prior instances of pneumothorax, a pre-procedural CXR was ordered. The pre-procedural CXR revealed a right upper lobe pneumothorax smaller than the pneumothorax visualized on 07/21/22 after thoracentesis.   In the interval between having the CXR done and beginning the thoracentesis, the patient stated that she is not feeling particularly short of breath or otherwise symptomatic at this time, and asked to defer the scheduled thoracentesis. No procedure was attempted today.   The patient denies shortness of breath, chest pain and/or discomfort, or any other symptoms at this time. The patient was discharged home with instructions to present to the ED if significant shortness of breath or chest pain occur. The patient voiced her understanding and that she would return if needed.   Lura Em, PA-C 08/09/2022 2:24 PM

## 2022-08-13 ENCOUNTER — Other Ambulatory Visit: Payer: Self-pay | Admitting: *Deleted

## 2022-08-13 DIAGNOSIS — J90 Pleural effusion, not elsewhere classified: Secondary | ICD-10-CM

## 2022-08-17 ENCOUNTER — Ambulatory Visit (INDEPENDENT_AMBULATORY_CARE_PROVIDER_SITE_OTHER): Payer: BC Managed Care – PPO | Admitting: Student in an Organized Health Care Education/Training Program

## 2022-08-17 ENCOUNTER — Encounter: Payer: Self-pay | Admitting: Oncology

## 2022-08-17 ENCOUNTER — Ambulatory Visit
Admission: RE | Admit: 2022-08-17 | Discharge: 2022-08-17 | Disposition: A | Discharge status: Home / Self Care | Payer: BC Managed Care – PPO | Source: Ambulatory Visit | Attending: Internal Medicine | Admitting: Internal Medicine

## 2022-08-17 ENCOUNTER — Other Ambulatory Visit: Payer: Self-pay | Admitting: Internal Medicine

## 2022-08-17 ENCOUNTER — Ambulatory Visit
Admission: RE | Admit: 2022-08-17 | Discharge: 2022-08-17 | Disposition: A | Discharge status: Home / Self Care | Payer: BC Managed Care – PPO | Source: Ambulatory Visit | Attending: Oncology | Admitting: Oncology

## 2022-08-17 VITALS — BP 138/78 | HR 105 | Temp 97.6°F | Ht 65.0 in | Wt 138.0 lb

## 2022-08-17 VITALS — BP 147/89 | HR 100 | Resp 18

## 2022-08-17 DIAGNOSIS — C3431 Malignant neoplasm of lower lobe, right bronchus or lung: Secondary | ICD-10-CM

## 2022-08-17 DIAGNOSIS — Z9889 Other specified postprocedural states: Secondary | ICD-10-CM

## 2022-08-17 DIAGNOSIS — J9383 Other pneumothorax: Secondary | ICD-10-CM | POA: Diagnosis not present

## 2022-08-17 DIAGNOSIS — J90 Pleural effusion, not elsewhere classified: Secondary | ICD-10-CM

## 2022-08-17 DIAGNOSIS — J9381 Chronic pneumothorax: Secondary | ICD-10-CM

## 2022-08-17 DIAGNOSIS — Z9221 Personal history of antineoplastic chemotherapy: Secondary | ICD-10-CM | POA: Diagnosis not present

## 2022-08-17 DIAGNOSIS — J948 Other specified pleural conditions: Secondary | ICD-10-CM | POA: Diagnosis not present

## 2022-08-17 MED ORDER — LIDOCAINE HCL (PF) 1 % IJ SOLN
10.0000 mL | Freq: Once | INTRAMUSCULAR | Status: AC
  Administered 2022-08-17: 10 mL via INTRADERMAL

## 2022-08-17 NOTE — Procedures (Signed)
Interventional Radiology Procedure Note  Procedure: [place] RT THORA    Complications: None  Estimated Blood Loss:  0  Findings: 1200CC REMOVED CYTO SENT    Sharen Counter, MD

## 2022-08-17 NOTE — Progress Notes (Signed)
Synopsis: Referred in for pleural effusion by Sindy Guadeloupe, MD  Assessment & Plan:   #Recurrent Pleural Effusion #Stage III NSCLCa #Pneumothorax  The patient is a pleasant 67 year old female with a recurrent right-sided pleural effusion in the setting of known non-small lung cancer treated with chemoradiation.  She developed a pneumothorax following her December thoracentesis taht was associated with chest discomfort consistent with a trapped/entrapped etiology resulting in a pneumothorax ex-vacuo. The pleural fluid cytology has remained negative and chemistries are consistent with an exudate (highly elevated pleural fluid cholesterol). Pleural fluid triglycerides are normal and are inconsistent with a chylothorax.  I have reviewed her previous imaging studies and could not identify a pleural rind but this does not preclude the lung from being fully non-expandable. I suspect that she has both increased production of pleural fluid (given exudative nature, history of malignancy) as well as decreased clearance of pleural fluid (secondary to chemo-radiation and likely destruction of lymphatic drainage).   As to management, I did discuss with the patient that her options include recurrent thoracentesis versus consideration for an indwelling pleural catheter. At this moment, given she is only requiring a thoracentesis every 4 to 6 weeks, the risks of an indwelling catheter greatly outweigh its benefits. I would recommend continuing with intermittent thoracentesis for the time being.   Today we also discussed possible repeat bronchoscopy for a fiber optic evaluation of the airway. There is an area of very faint PET avidity in the right lower lobe with a potential plane for EBUS TBNA for sampling. I have offered bronchoscopy to them and they would like to discuss it amongst themselves and revisit this in a couple of weeks. I did also discuss with the patient and her husband that there might be a role  for medical pleuroscopy to evaluate the pleural cavity should further investigation into the cause not be revelatory. She would require a referral to an interventional pulmonologist (likely [facility] or [facility]) for that.  Recommendations: -continue with intermittent thoracentesis -flexible bronchoscopy, EBUS with TBNA  Return in about 4 weeks (around 09/14/2022).  I spent 30 minutes caring for this patient today, including preparing to see the patient, obtaining a medical history , reviewing a separately obtained history, performing a medically appropriate examination and/or evaluation, counseling and educating the patient/family/caregiver, ordering medications, tests, or procedures, and documenting clinical information in the electronic health record  Armando Reichert, MD [facility] 08/17/2022 6:24 PM    End of visit medications:  No orders of the defined types were placed in this encounter.    Current Outpatient Medications:    acetaminophen (TYLENOL) 500 MG tablet, Take 500 mg by mouth every 6 (six) hours as needed., Disp: , Rfl:    ALPRAZolam (XANAX) 0.5 MG tablet, Take 0.5 mg by mouth at bedtime as needed for sleep., Disp: , Rfl:    Cholecalciferol 50 MCG (2000 UT) CAPS, Take 2,000 Units by mouth daily., Disp: , Rfl:    esomeprazole (NEXIUM) 20 MG capsule, Take 20 mg by mouth as needed., Disp: , Rfl:    fluticasone (FLONASE) 50 MCG/ACT nasal spray, Place 2 sprays into both nostrils daily as needed for rhinitis., Disp: , Rfl:    fluticasone-salmeterol (ADVAIR) 250-50 MCG/ACT AEPB, Inhale 1 puff into the lungs as needed., Disp: , Rfl:    Fluticasone-Umeclidin-Vilant (TRELEGY ELLIPTA) 100-62.5-25 MCG/ACT AEPB, Inhale 1 puff into the lungs as needed., Disp: , Rfl:    folic acid (FOLVITE) 1 MG tablet, TAKE 1 TABLET BY MOUTH  EVERY DAY, Disp: 90 tablet, Rfl: 3   furosemide (LASIX) 20 MG tablet, Take 1 tablet by mouth daily., Disp: , Rfl:    metoprolol succinate (TOPROL-XL) 50 MG  24 hr tablet, Take 50 mg by mouth every morning., Disp: , Rfl:    montelukast (SINGULAIR) 10 MG tablet, Take 1 tablet by mouth at bedtime., Disp: , Rfl:    spironolactone (ALDACTONE) 25 MG tablet, Take 25 mg by mouth daily., Disp: , Rfl:    torsemide (DEMADEX) 20 MG tablet, Take 20 mg by mouth daily., Disp: , Rfl:    zolpidem (AMBIEN) 5 MG tablet, Take 0.5 tablets by mouth at bedtime as needed., Disp: , Rfl:    azelastine (ASTELIN) 0.1 % nasal spray, Place 2 sprays into both nostrils 2 (two) times daily. Use in each nostril as directed (Patient not taking: Reported on 08/17/2022), Disp: 30 mL, Rfl: 2   budesonide-formoterol (SYMBICORT) 80-4.5 MCG/ACT inhaler, Inhale 2 puffs into the lungs 2 (two) times daily as needed. (Patient not taking: Reported on 08/17/2022), Disp: , Rfl:  No current facility-administered medications for this visit.  Facility-Administered Medications Ordered in Other Visits:    heparin lock flush 100 UNIT/ML injection, , , ,    heparin lock flush 100 UNIT/ML injection, , , ,    heparin lock flush 100 UNIT/ML injection, , , ,    heparin lock flush 100 UNIT/ML injection, , , ,    heparin lock flush 100 unit/mL, 500 Units, Intravenous, Once, Sindy Guadeloupe, MD   sodium chloride flush (NS) 0.9 % injection 10 mL, 10 mL, Intravenous, Once, Sindy Guadeloupe, MD   sodium chloride flush (NS) 0.9 % injection 10 mL, 10 mL, Intravenous, PRN, Sindy Guadeloupe, MD, 10 mL at 01/26/21 0807   sodium chloride flush (NS) 0.9 % injection 10 mL, 10 mL, Intravenous, Once, Sindy Guadeloupe, MD   Subjective:   PATIENT ID: Katherine Perry GENDER: female DOB: 03-29-1956, MRN: [id number]  Chief Complaint  Patient presents with   Consult    ED 07/19/2022 for Pleural effusion. No SOB or wheezing. Cough with clear sputum for 2 weeks.    HPI  Katherine Perry is a pleasant 67 year old female with a history of known non-small cell lung cancer s/p chemo-radiation with a recurrent pleural effusion who is presenting  to clinic for a second opinion. I had seen Katherine Perry in consultation in the hospital in December of 2023.   She had developed a recurrent right-sided pleural effusion status post multiple thoracentesis with interventional radiology.  The pleural fluid had been exudative but cytology remained negative.  She was diagnosed with lung cancer in April 2022 (biopsy at that time of the right lower lobe showed non-small cell lung cancer consistent with adenocarcinoma) and received chemoradiation in coordination with oncology and radiation oncology.  She underwent thoracentesis in June 2023 (650 mL), September 2023 (1 L), October 2023 (1.2 L), and December 2023 (1.5 L), January 2024 (1.2 L).  The pleural fluid was exudative by lights criteria (LDH and protein sent) while cytology has been negative. She had her thoracentesis right before presenting to this clinic appointment.   She reports that her shortness of breath slowly gets worse between thoracentesis and she could feel that she is going to need it.  She mostly complains of shortness of breath at rest as well as with exertion in addition to a cough.  The improvement in the shortness of breath is quick following the thoracentesis. She  did not experience chest pain or tightness following today's thoracentesis. She was found to have a post procedural pneumothorax following December's thoracentesis for which she required a visit to the ED and a quick admission for observation.   She has been followed by Dr. Lanney Gins at [facility] clinic pulmonology and the patient had asked for a second opinion in regards to the etiology of the pleural effusion.  Ancillary information including prior medications, full medical/surgical/family/social histories, and PFTs (when available) are listed below and have been reviewed.   Review of Systems  Constitutional:  Negative for chills and fever.  Respiratory:  Positive for cough and shortness of breath. Negative for sputum production  and wheezing.   Cardiovascular:  Negative for chest pain.  Skin:  Negative for rash.     Objective:   Vitals:   08/17/22 1555  BP: 138/78  Pulse: (!) 105  Temp: 97.6 F (36.4 C)  SpO2: 98%  Weight: 138 lb (62.6 kg)  Height: 5' 5"  (1.651 m)   98% on RA BMI Readings from Last 3 Encounters:  08/17/22 22.96 kg/m  07/19/22 23.63 kg/m  05/31/22 23.63 kg/m   Wt Readings from Last 3 Encounters:  08/17/22 138 lb (62.6 kg)  07/19/22 142 lb (64.4 kg)  05/31/22 142 lb (64.4 kg)    Physical Exam Constitutional:      Appearance: Normal appearance. She is not ill-appearing.  HENT:     Head: Normocephalic.     Mouth/Throat:     Mouth: Mucous membranes are moist.  Cardiovascular:     Rate and Rhythm: Normal rate and regular rhythm.     Heart sounds: Normal heart sounds.  Pulmonary:     Comments: Decreased breath sounds over the right lower lung field. Good air entry over the left. No wheezing noted. Right lower lung field dull to percussion. Neurological:     General: No focal deficit present.     Mental Status: She is alert and oriented to person, place, and time. Mental status is at baseline.     Ancillary Information    Past Medical History:  Diagnosis Date   Anxiety    Asthma    Cancer ([place])    Cough    Dyspnea    with exertion   GERD (gastroesophageal reflux disease)    Hypertension    Lung cancer ([place])      Family History  Problem Relation Age of Onset   Breast cancer Neg Hx      Past Surgical History:  Procedure Laterality Date   BUNIONECTOMY Bilateral    COLONOSCOPY     COLONOSCOPY WITH PROPOFOL N/A 07/30/2021   Procedure: COLONOSCOPY WITH PROPOFOL;  Surgeon: Annamaria Helling, DO;  Location: [facility] ENDOSCOPY;  Service: Gastroenterology;  Laterality: N/A;   ESOPHAGOGASTRODUODENOSCOPY  04/01/2006   PORTA CATH INSERTION N/A 12/22/2020   Procedure: PORTA CATH INSERTION;  Surgeon: Algernon Huxley, MD;  Location: [facility];  Service:  Cardiovascular;  Laterality: N/A;   PORTA CATH REMOVAL N/A 05/31/2022   Procedure: PORTA CATH REMOVAL;  Surgeon: Algernon Huxley, MD;  Location: [facility];  Service: Cardiovascular;  Laterality: N/A;   VIDEO BRONCHOSCOPY WITH ENDOBRONCHIAL NAVIGATION N/A 11/07/2020   Procedure: VIDEO BRONCHOSCOPY WITH ENDOBRONCHIAL NAVIGATION;  Surgeon: Ottie Glazier, MD;  Location: [facility] ORS;  Service: Thoracic;  Laterality: N/A;   VIDEO BRONCHOSCOPY WITH ENDOBRONCHIAL ULTRASOUND N/A 11/07/2020   Procedure: VIDEO BRONCHOSCOPY WITH ENDOBRONCHIAL ULTRASOUND;  Surgeon: Ottie Glazier, MD;  Location: [facility] ORS;  Service: Thoracic;  Laterality: N/A;    Social History   Socioeconomic History   Marital status: Married    Spouse name: Not on file   Number of children: Not on file   Years of education: Not on file   Highest education level: Not on file  Occupational History   Not on file  Tobacco Use   Smoking status: Former    Packs/day: 1.50    Years: 26.80    Total pack years: 40.20    Types: Cigarettes    Quit date: 11/07/1994    Years since quitting: 27.7   Smokeless tobacco: Never  Vaping Use   Vaping Use: Never used  Substance and Sexual Activity   Alcohol use: Yes    Comment:  wine daily   Drug use: Never   Sexual activity: Yes  Other Topics Concern   Not on file  Social History Narrative   Lives with husband, Thayer Jew, no pets.   Social Determinants of Health   Financial Resource Strain: Not on file  Food Insecurity: Not on file  Transportation Needs: Not on file  Physical Activity: Not on file  Stress: Not on file  Social Connections: Not on file  Intimate Partner Violence: Not on file     Allergies  Allergen Reactions   Ace Inhibitors Cough     CBC    Component Value Date/Time   WBC 5.2 07/21/2022 0525   RBC 3.90 07/21/2022 0525   HGB 13.2 07/21/2022 0525   HCT 39.4 07/21/2022 0525   PLT 276 07/21/2022 0525   MCV 101.0 (H) 07/21/2022 0525   MCH 33.8 07/21/2022  0525   MCHC 33.5 07/21/2022 0525   RDW 12.9 07/21/2022 0525   LYMPHSABS 1.0 07/19/2022 1818   MONOABS 0.5 07/19/2022 1818   EOSABS 0.2 07/19/2022 1818   BASOSABS 0.1 07/19/2022 1818    Pulmonary Functions Testing Results:     No data to display          Outpatient Medications Prior to Visit  Medication Sig Dispense Refill   acetaminophen (TYLENOL) 500 MG tablet Take 500 mg by mouth every 6 (six) hours as needed.     ALPRAZolam (XANAX) 0.5 MG tablet Take 0.5 mg by mouth at bedtime as needed for sleep.     Cholecalciferol 50 MCG (2000 UT) CAPS Take 2,000 Units by mouth daily.     esomeprazole (NEXIUM) 20 MG capsule Take 20 mg by mouth as needed.     fluticasone (FLONASE) 50 MCG/ACT nasal spray Place 2 sprays into both nostrils daily as needed for rhinitis.     fluticasone-salmeterol (ADVAIR) 250-50 MCG/ACT AEPB Inhale 1 puff into the lungs as needed.     Fluticasone-Umeclidin-Vilant (TRELEGY ELLIPTA) 100-62.5-25 MCG/ACT AEPB Inhale 1 puff into the lungs as needed.     folic acid (FOLVITE) 1 MG tablet TAKE 1 TABLET BY MOUTH EVERY DAY 90 tablet 3   furosemide (LASIX) 20 MG tablet Take 1 tablet by mouth daily.     metoprolol succinate (TOPROL-XL) 50 MG 24 hr tablet Take 50 mg by mouth every morning.     montelukast (SINGULAIR) 10 MG tablet Take 1 tablet by mouth at bedtime.     spironolactone (ALDACTONE) 25 MG tablet Take 25 mg by mouth daily.     torsemide (DEMADEX) 20 MG tablet Take 20 mg by mouth daily.     zolpidem (AMBIEN) 5 MG tablet Take 0.5 tablets by mouth at bedtime as needed.     azelastine (ASTELIN)  0.1 % nasal spray Place 2 sprays into both nostrils 2 (two) times daily. Use in each nostril as directed (Patient not taking: Reported on 08/17/2022) 30 mL 2   budesonide-formoterol (SYMBICORT) 80-4.5 MCG/ACT inhaler Inhale 2 puffs into the lungs 2 (two) times daily as needed. (Patient not taking: Reported on 08/17/2022)     zolpidem (AMBIEN) 10 MG tablet Take 5 mg by mouth at  bedtime as needed for sleep.     Facility-Administered Medications Prior to Visit  Medication Dose Route Frequency Provider Last Rate Last Admin   heparin lock flush 100 UNIT/ML injection            heparin lock flush 100 UNIT/ML injection            heparin lock flush 100 UNIT/ML injection            heparin lock flush 100 UNIT/ML injection            heparin lock flush 100 unit/mL  500 Units Intravenous Once Sindy Guadeloupe, MD       sodium chloride flush (NS) 0.9 % injection 10 mL  10 mL Intravenous Once Sindy Guadeloupe, MD       sodium chloride flush (NS) 0.9 % injection 10 mL  10 mL Intravenous PRN Sindy Guadeloupe, MD   10 mL at 01/26/21 0807   sodium chloride flush (NS) 0.9 % injection 10 mL  10 mL Intravenous Once Sindy Guadeloupe, MD

## 2022-08-19 LAB — CYTOLOGY - NON PAP

## 2022-08-24 ENCOUNTER — Telehealth: Payer: Self-pay

## 2022-08-24 NOTE — Telephone Encounter (Signed)
EBUS and flexible scheduled for 09/10/2022 at 12:00. CPT:31628,31652,31653 NK:NNUD nodule  [facility] needed

## 2022-08-25 NOTE — Telephone Encounter (Signed)
Phone pre admit visit 08/31/22 between 8-1 and covid test 09/08/2022 9:00   Spoke to patient. She stated that she was under the impression that Dr. Genia Harold wanted to hold off on bronch for now. She would like clarification.  Dr. Genia Harold, please advise. Thanks

## 2022-08-25 NOTE — Telephone Encounter (Signed)
For the codes 31628, [phone], [id number] Prior Auth Not Required Refer # [name] [id number] 08/25/2022

## 2022-08-26 NOTE — Telephone Encounter (Signed)
Dr. Genia Harold, please advise. Thanks

## 2022-08-27 NOTE — Telephone Encounter (Signed)
Case has been canceled.  Nothing further needed.

## 2022-08-31 ENCOUNTER — Other Ambulatory Visit: Payer: Medicare Other

## 2022-09-08 ENCOUNTER — Other Ambulatory Visit: Payer: Medicare Other

## 2022-09-08 ENCOUNTER — Encounter: Payer: Self-pay | Admitting: Oncology

## 2022-09-08 ENCOUNTER — Other Ambulatory Visit: Payer: Self-pay | Admitting: *Deleted

## 2022-09-08 DIAGNOSIS — J90 Pleural effusion, not elsewhere classified: Secondary | ICD-10-CM

## 2022-09-09 ENCOUNTER — Other Ambulatory Visit: Payer: Self-pay | Admitting: Radiology

## 2022-09-09 ENCOUNTER — Ambulatory Visit
Admission: RE | Admit: 2022-09-09 | Discharge: 2022-09-09 | Disposition: A | Discharge status: Home / Self Care | Payer: Medicare Other | Source: Ambulatory Visit | Attending: Radiology | Admitting: Radiology

## 2022-09-09 ENCOUNTER — Ambulatory Visit
Admission: RE | Admit: 2022-09-09 | Discharge: 2022-09-09 | Disposition: A | Discharge status: Home / Self Care | Payer: Medicare Other | Source: Ambulatory Visit | Attending: Oncology | Admitting: Oncology

## 2022-09-09 DIAGNOSIS — J90 Pleural effusion, not elsewhere classified: Secondary | ICD-10-CM | POA: Diagnosis present

## 2022-09-09 DIAGNOSIS — J948 Other specified pleural conditions: Secondary | ICD-10-CM | POA: Insufficient documentation

## 2022-09-09 DIAGNOSIS — R059 Cough, unspecified: Secondary | ICD-10-CM | POA: Diagnosis not present

## 2022-09-09 DIAGNOSIS — Z9889 Other specified postprocedural states: Secondary | ICD-10-CM

## 2022-09-09 MED ORDER — LIDOCAINE HCL (PF) 1 % IJ SOLN
10.0000 mL | Freq: Once | INTRAMUSCULAR | Status: AC
  Administered 2022-09-09: 10 mL via SUBCUTANEOUS
  Filled 2022-09-09: qty 10

## 2022-09-09 NOTE — Procedures (Signed)
PROCEDURE SUMMARY:  Patient with history right-sided lung cancer now in remission with persistent recurrent symptomatic right pleural effusion.  Patient has known right hydropneumothorax that is chronic, patient states she has noticed an improvement in her cough with previous thoracentesis and wishes to proceed today.  A pre-procedure chest x-ray was performed and reviewed with my attending prior to the procedure.  Successful US guided right thoracentesis. Yielded 1 liter of clear yellow serous appearing was not fluid. Pt tolerated procedure well. No immediate complications.  Specimen was not sent for labs. CXR ordered.  The patient denies any current post procedure chest pain and denies any worsening shortness of breath after the procedure today.  She states she already feels improvement in her cough.  EBL < 1 mL  Rockney Ghee 09/09/2022 10:13 AM

## 2022-09-09 NOTE — Discharge Instructions (Signed)
Instructions discussed with patient. PCS

## 2022-09-10 ENCOUNTER — Ambulatory Visit: Admit: 2022-09-10 | Payer: Medicare Other | Admitting: Student in an Organized Health Care Education/Training Program

## 2022-09-10 SURGERY — BRONCHOSCOPY, WITH EBUS
Anesthesia: General | Laterality: Right

## 2022-09-27 ENCOUNTER — Telehealth: Payer: Self-pay

## 2022-09-27 ENCOUNTER — Ambulatory Visit: Payer: Medicare Other | Admitting: Student in an Organized Health Care Education/Training Program

## 2022-09-27 ENCOUNTER — Encounter: Payer: Self-pay | Admitting: Student in an Organized Health Care Education/Training Program

## 2022-09-27 VITALS — BP 138/82 | HR 78 | Temp 97.1°F | Ht 65.0 in | Wt 143.8 lb

## 2022-09-27 DIAGNOSIS — C3431 Malignant neoplasm of lower lobe, right bronchus or lung: Secondary | ICD-10-CM

## 2022-09-27 DIAGNOSIS — J90 Pleural effusion, not elsewhere classified: Secondary | ICD-10-CM | POA: Diagnosis not present

## 2022-09-27 NOTE — Progress Notes (Signed)
Assessment & Plan:   #Recurrent Pleural Effusion #Stage III NSCLCa #Pneumothorax   The patient is a pleasant 67 year old female with a recurrent right-sided pleural effusion in the setting of known non-small lung cancer treated with chemoradiation.   She developed a pneumothorax following her December thoracentesis that was associated with chest discomfort consistent with a trapped/entrapped etiology resulting in a pneumothorax ex-vacuo. The pleural fluid cytology has remained negative and chemistries are consistent with an exudate (highly elevated pleural fluid cholesterol). Pleural fluid triglycerides are normal and are inconsistent with a chylothorax.   I have reviewed her previous imaging studies and could not identify a pleural rind but this does not preclude the lung from being fully non-expandable. I suspect that she has both increased production of pleural fluid (given exudative nature, history of malignancy) as well as decreased clearance of pleural fluid (secondary to chemo-radiation and likely destruction of lymphatic drainage). PET/CT does show an area of faint avidity in the right lower lobe.   As to management, I did discuss with the patient that her options include recurrent thoracentesis versus consideration for an indwelling pleural catheter. At this moment, given she is only requiring a thoracentesis every 4 to 6 weeks, the risks of an indwelling catheter greatly outweigh its benefits. I would recommend continuing with intermittent thoracentesis for the time being. We also discussed that there could be a role for pleuroscopy vs. VATS for evaluation of the pleural space and lung tissue should there be no clear answer as to why her effusion continues to recur.   Today we also discussed possible repeat bronchoscopy for a fiber optic evaluation of the airway. There is an area of very faint PET avidity in the right lower lobe with a potential plane for EBUS TBNA for sampling. I have  offered bronchoscopy and Ms. Feeman would like to precede with said procedure.   Recommendations: -continue with intermittent thoracentesis -flexible bronchoscopy, EBUS with TBNA   Return in about 3 months (around 12/26/2022).  I spent 30 minutes caring for this patient today, including preparing to see the patient, obtaining a medical history , reviewing a separately obtained history, performing a medically appropriate examination and/or evaluation, counseling and educating the patient/family/caregiver, ordering medications, tests, or procedures, and documenting clinical information in the electronic health record  Armando Reichert, MD [facility] 09/27/2022 5:28 PM    End of visit medications:  No orders of the defined types were placed in this encounter.    Current Outpatient Medications:    acetaminophen (TYLENOL) 500 MG tablet, Take 500 mg by mouth every 6 (six) hours as needed., Disp: , Rfl:    ALPRAZolam (XANAX) 0.5 MG tablet, Take 0.5 mg by mouth at bedtime as needed for sleep., Disp: , Rfl:    Cholecalciferol 50 MCG (2000 UT) CAPS, Take 2,000 Units by mouth daily., Disp: , Rfl:    esomeprazole (NEXIUM) 20 MG capsule, Take 20 mg by mouth as needed., Disp: , Rfl:    fluticasone (FLONASE) 50 MCG/ACT nasal spray, Place 2 sprays into both nostrils daily as needed for rhinitis., Disp: , Rfl:    Fluticasone-Umeclidin-Vilant (TRELEGY ELLIPTA) 100-62.5-25 MCG/ACT AEPB, Inhale 1 puff into the lungs as needed., Disp: , Rfl:    folic acid (FOLVITE) 1 MG tablet, TAKE 1 TABLET BY MOUTH EVERY DAY, Disp: 90 tablet, Rfl: 3   furosemide (LASIX) 20 MG tablet, Take 1 tablet by mouth daily., Disp: , Rfl:    metoprolol succinate (TOPROL-XL) 50 MG 24 hr  tablet, Take 50 mg by mouth every morning., Disp: , Rfl:    montelukast (SINGULAIR) 10 MG tablet, Take 1 tablet by mouth at bedtime., Disp: , Rfl:    spironolactone (ALDACTONE) 25 MG tablet, Take 25 mg by mouth daily., Disp: , Rfl:     torsemide (DEMADEX) 20 MG tablet, Take 20 mg by mouth daily., Disp: , Rfl:    zolpidem (AMBIEN) 5 MG tablet, Take 0.5 tablets by mouth at bedtime as needed., Disp: , Rfl:    azelastine (ASTELIN) 0.1 % nasal spray, Place 2 sprays into both nostrils 2 (two) times daily. Use in each nostril as directed (Patient not taking: Reported on 08/17/2022), Disp: 30 mL, Rfl: 2   budesonide-formoterol (SYMBICORT) 80-4.5 MCG/ACT inhaler, Inhale 2 puffs into the lungs 2 (two) times daily as needed. (Patient not taking: Reported on 08/17/2022), Disp: , Rfl:    fluticasone-salmeterol (ADVAIR) 250-50 MCG/ACT AEPB, Inhale 1 puff into the lungs as needed. (Patient not taking: Reported on 09/27/2022), Disp: , Rfl:  No current facility-administered medications for this visit.  Facility-Administered Medications Ordered in Other Visits:    heparin lock flush 100 UNIT/ML injection, , , ,    heparin lock flush 100 UNIT/ML injection, , , ,    heparin lock flush 100 UNIT/ML injection, , , ,    heparin lock flush 100 UNIT/ML injection, , , ,    heparin lock flush 100 unit/mL, 500 Units, Intravenous, Once, Sindy Guadeloupe, MD   sodium chloride flush (NS) 0.9 % injection 10 mL, 10 mL, Intravenous, Once, Sindy Guadeloupe, MD   sodium chloride flush (NS) 0.9 % injection 10 mL, 10 mL, Intravenous, PRN, Sindy Guadeloupe, MD, 10 mL at 01/26/21 0807   sodium chloride flush (NS) 0.9 % injection 10 mL, 10 mL, Intravenous, Once, Sindy Guadeloupe, MD   Subjective:   PATIENT ID: Leane Call GENDER: female DOB: [birth year], MRN: [id number]  Chief Complaint  Patient presents with   Follow-up    Breathing is ok. No SOB. Little wheezing. Dry cough.    HPI  Ms. Halilovic is a pleasant 67 year old female with a history of non-small cell lung cancer s/p chemo-radiation with a recurrent pleural effusion who is presenting to clinic for follow up. I had seen Ms. Lanoue in consultation in the hospital in December of 2023.  She had developed a recurrent  right-sided pleural effusion status post multiple thoracentesis with interventional radiology. The pleural fluid had been exudative but cytology remained negative.  She was diagnosed with lung cancer in April 2022 (biopsy at that time of the right lower lobe showed non-small cell lung cancer consistent with adenocarcinoma) and received chemoradiation in coordination with oncology and radiation oncology. She underwent thoracentesis in June 2023 (650 mL), September 2023 (1 L), October 2023 (1.2 L), and December 2023 (1.5 L), January 2024 (1.2 L), and February 2024 (1 L).  The pleural fluid was exudative by lights criteria (LDH and protein sent) while cytology has been negative.   She reports that her shortness of breath slowly gets worse between thoracentesis and she could feel that she is going to need it.  She mostly complains of shortness of breath at rest as well as with exertion in addition to a cough.  The improvement in the shortness of breath is quick following the thoracentesis. She was found to have a post procedural pneumothorax following December's thoracentesis for which she required a visit to the ED and a quick admission for observation.  She has been followed by Dr. Lanney Gins at [facility] pulmonology and the patient had asked for a second opinion in regards to the etiology of the pleural effusion.  Following our last visit in January, there was confusion regarding the next steps. We had scheduled a bronchoscopy but this was cancelled given the uncertainty. Ms. Bommer is here to discuss this further.  Ancillary information including prior medications, full medical/surgical/family/social histories, and PFTs (when available) are listed below and have been reviewed.   Review of Systems  Constitutional:  Negative for chills and fever.  Respiratory:  Positive for cough and shortness of breath. Negative for sputum production and wheezing.   Cardiovascular:  Negative for chest pain.  Skin:   Negative for rash.     Objective:   Vitals:   09/27/22 1551  BP: 138/82  Pulse: 78  Temp: (!) 97.1 F (36.2 C)  SpO2: 99%  Weight: 143 lb 12.8 oz (65.2 kg)  Height: '5\' 5"'$  (1.651 m)   99% on RA  BMI Readings from Last 3 Encounters:  09/27/22 23.93 kg/m  08/17/22 22.96 kg/m  07/19/22 23.63 kg/m   Wt Readings from Last 3 Encounters:  09/27/22 143 lb 12.8 oz (65.2 kg)  08/17/22 138 lb (62.6 kg)  07/19/22 142 lb (64.4 kg)    Physical Exam Constitutional:      Appearance: Normal appearance. She is not ill-appearing.  HENT:     Head: Normocephalic.     Mouth/Throat:     Mouth: Mucous membranes are moist.  Cardiovascular:     Rate and Rhythm: Normal rate and regular rhythm.     Heart sounds: Normal heart sounds.  Pulmonary:     Comments: Decreased breath sounds over the right lower lung field. Good air entry over the left. No wheezing noted. Right lower lung field dull to percussion. Neurological:     General: No focal deficit present.     Mental Status: She is alert and oriented to person, place, and time. Mental status is at baseline.       Ancillary Information    Past Medical History:  Diagnosis Date   Anxiety    Asthma    Cancer ([place])    Cough    Dyspnea    with exertion   GERD (gastroesophageal reflux disease)    Hypertension    Lung cancer ([place])      Family History  Problem Relation Age of Onset   Breast cancer Neg Hx      Past Surgical History:  Procedure Laterality Date   BUNIONECTOMY Bilateral    COLONOSCOPY     COLONOSCOPY WITH PROPOFOL N/A 07/30/2021   Procedure: COLONOSCOPY WITH PROPOFOL;  Surgeon: Annamaria Helling, DO;  Location: [facility] ENDOSCOPY;  Service: Gastroenterology;  Laterality: N/A;   ESOPHAGOGASTRODUODENOSCOPY  04/01/2006   PORTA CATH INSERTION N/A 12/22/2020   Procedure: PORTA CATH INSERTION;  Surgeon: Algernon Huxley, MD;  Location: [facility];  Service: Cardiovascular;  Laterality: N/A;   PORTA CATH  REMOVAL N/A 05/31/2022   Procedure: PORTA CATH REMOVAL;  Surgeon: Algernon Huxley, MD;  Location: [place] [facility];  Service: Cardiovascular;  Laterality: N/A;   VIDEO BRONCHOSCOPY WITH ENDOBRONCHIAL NAVIGATION N/A 11/07/2020   Procedure: VIDEO BRONCHOSCOPY WITH ENDOBRONCHIAL NAVIGATION;  Surgeon: Ottie Glazier, MD;  Location: [facility] ORS;  Service: Thoracic;  Laterality: N/A;   VIDEO BRONCHOSCOPY WITH ENDOBRONCHIAL ULTRASOUND N/A 11/07/2020   Procedure: VIDEO BRONCHOSCOPY WITH ENDOBRONCHIAL ULTRASOUND;  Surgeon: Ottie Glazier, MD;  Location: [facility]  ORS;  Service: Thoracic;  Laterality: N/A;    Social History   Socioeconomic History   Marital status: Married    Spouse name: Not on file   Number of children: Not on file   Years of education: Not on file   Highest education level: Not on file  Occupational History   Not on file  Tobacco Use   Smoking status: Former    Packs/day: 1.50    Years: 26.80    Total pack years: 40.20    Types: Cigarettes    Quit date: 11/07/1994    Years since quitting: 27.9   Smokeless tobacco: Never  Vaping Use   Vaping Use: Never used  Substance and Sexual Activity   Alcohol use: Yes    Comment:  wine daily   Drug use: Never   Sexual activity: Yes  Other Topics Concern   Not on file  Social History Narrative   Lives with husband, Thayer Jew, no pets.   Social Determinants of Health   Financial Resource Strain: Not on file  Food Insecurity: Not on file  Transportation Needs: Not on file  Physical Activity: Not on file  Stress: Not on file  Social Connections: Not on file  Intimate Partner Violence: Not on file     Allergies  Allergen Reactions   Ace Inhibitors Cough     CBC    Component Value Date/Time   WBC 5.2 07/21/2022 0525   RBC 3.90 07/21/2022 0525   HGB 13.2 07/21/2022 0525   HCT 39.4 07/21/2022 0525   PLT 276 07/21/2022 0525   MCV 101.0 (H) 07/21/2022 0525   MCH 33.8 07/21/2022 0525   MCHC 33.5 07/21/2022 0525   RDW 12.9  07/21/2022 0525   LYMPHSABS 1.0 07/19/2022 1818   MONOABS 0.5 07/19/2022 1818   EOSABS 0.2 07/19/2022 1818   BASOSABS 0.1 07/19/2022 1818    Pulmonary Functions Testing Results:     No data to display          Outpatient Medications Prior to Visit  Medication Sig Dispense Refill   acetaminophen (TYLENOL) 500 MG tablet Take 500 mg by mouth every 6 (six) hours as needed.     ALPRAZolam (XANAX) 0.5 MG tablet Take 0.5 mg by mouth at bedtime as needed for sleep.     Cholecalciferol 50 MCG (2000 UT) CAPS Take 2,000 Units by mouth daily.     esomeprazole (NEXIUM) 20 MG capsule Take 20 mg by mouth as needed.     fluticasone (FLONASE) 50 MCG/ACT nasal spray Place 2 sprays into both nostrils daily as needed for rhinitis.     Fluticasone-Umeclidin-Vilant (TRELEGY ELLIPTA) 100-62.5-25 MCG/ACT AEPB Inhale 1 puff into the lungs as needed.     folic acid (FOLVITE) 1 MG tablet TAKE 1 TABLET BY MOUTH EVERY DAY 90 tablet 3   furosemide (LASIX) 20 MG tablet Take 1 tablet by mouth daily.     metoprolol succinate (TOPROL-XL) 50 MG 24 hr tablet Take 50 mg by mouth every morning.     montelukast (SINGULAIR) 10 MG tablet Take 1 tablet by mouth at bedtime.     spironolactone (ALDACTONE) 25 MG tablet Take 25 mg by mouth daily.     torsemide (DEMADEX) 20 MG tablet Take 20 mg by mouth daily.     zolpidem (AMBIEN) 5 MG tablet Take 0.5 tablets by mouth at bedtime as needed.     azelastine (ASTELIN) 0.1 % nasal spray Place 2 sprays into both nostrils 2 (two) times daily. Use  in each nostril as directed (Patient not taking: Reported on 08/17/2022) 30 mL 2   budesonide-formoterol (SYMBICORT) 80-4.5 MCG/ACT inhaler Inhale 2 puffs into the lungs 2 (two) times daily as needed. (Patient not taking: Reported on 08/17/2022)     fluticasone-salmeterol (ADVAIR) 250-50 MCG/ACT AEPB Inhale 1 puff into the lungs as needed. (Patient not taking: Reported on 09/27/2022)     Facility-Administered Medications Prior to Visit   Medication Dose Route Frequency Provider Last Rate Last Admin   heparin lock flush 100 UNIT/ML injection            heparin lock flush 100 UNIT/ML injection            heparin lock flush 100 UNIT/ML injection            heparin lock flush 100 UNIT/ML injection            heparin lock flush 100 unit/mL  500 Units Intravenous Once Sindy Guadeloupe, MD       sodium chloride flush (NS) 0.9 % injection 10 mL  10 mL Intravenous Once Sindy Guadeloupe, MD       sodium chloride flush (NS) 0.9 % injection 10 mL  10 mL Intravenous PRN Sindy Guadeloupe, MD   10 mL at 01/26/21 0807   sodium chloride flush (NS) 0.9 % injection 10 mL  10 mL Intravenous Once Sindy Guadeloupe, MD

## 2022-09-27 NOTE — Telephone Encounter (Signed)
Patient will need flexible bronchoscopy. Will need to discuss dates with  Dr. Genia Harold.   Will hold message to ensure follow up.

## 2022-09-27 NOTE — H&P (View-Only) (Signed)
  Assessment & Plan:   #Recurrent Pleural Effusion #Stage III NSCLCa #Pneumothorax   The patient is a pleasant 66-year-old female with a recurrent right-sided pleural effusion in the setting of known non-small lung cancer treated with chemoradiation.   She developed a pneumothorax following her December thoracentesis that was associated with chest discomfort consistent with a trapped/entrapped etiology resulting in a pneumothorax ex-vacuo. The pleural fluid cytology has remained negative and chemistries are consistent with an exudate (highly elevated pleural fluid cholesterol). Pleural fluid triglycerides are normal and are inconsistent with a chylothorax.   I have reviewed her previous imaging studies and could not identify a pleural rind but this does not preclude the lung from being fully non-expandable. I suspect that she has both increased production of pleural fluid (given exudative nature, history of malignancy) as well as decreased clearance of pleural fluid (secondary to chemo-radiation and likely destruction of lymphatic drainage). PET/CT does show an area of faint avidity in the right lower lobe.   As to management, I did discuss with the patient that her options include recurrent thoracentesis versus consideration for an indwelling pleural catheter. At this moment, given she is only requiring a thoracentesis every 4 to 6 weeks, the risks of an indwelling catheter greatly outweigh its benefits. I would recommend continuing with intermittent thoracentesis for the time being. We also discussed that there could be a role for pleuroscopy vs. VATS for evaluation of the pleural space and lung tissue should there be no clear answer as to why her effusion continues to recur.   Today we also discussed possible repeat bronchoscopy for a fiber optic evaluation of the airway. There is an area of very faint PET avidity in the right lower lobe with a potential plane for EBUS TBNA for sampling. I have  offered bronchoscopy and Katherine Perry would like to precede with said procedure.   Recommendations: -continue with intermittent thoracentesis -flexible bronchoscopy, EBUS with TBNA   Return in about 3 months (around 12/26/2022).  I spent 30 minutes caring for this patient today, including preparing to see the patient, obtaining a medical history , reviewing a separately obtained history, performing a medically appropriate examination and/or evaluation, counseling and educating the patient/family/caregiver, ordering medications, tests, or procedures, and documenting clinical information in the electronic health record  Katherine Shiroma, MD [facility] 09/27/2022 5:28 PM    End of visit medications:  No orders of the defined types were placed in this encounter.    Current Outpatient Medications:    acetaminophen (TYLENOL) 500 MG tablet, Take 500 mg by mouth every 6 (six) hours as needed., Disp: , Rfl:    ALPRAZolam (XANAX) 0.5 MG tablet, Take 0.5 mg by mouth at bedtime as needed for sleep., Disp: , Rfl:    Cholecalciferol 50 MCG (2000 UT) CAPS, Take 2,000 Units by mouth daily., Disp: , Rfl:    esomeprazole (NEXIUM) 20 MG capsule, Take 20 mg by mouth as needed., Disp: , Rfl:    fluticasone (FLONASE) 50 MCG/ACT nasal spray, Place 2 sprays into both nostrils daily as needed for rhinitis., Disp: , Rfl:    Fluticasone-Umeclidin-Vilant (TRELEGY ELLIPTA) 100-62.5-25 MCG/ACT AEPB, Inhale 1 puff into the lungs as needed., Disp: , Rfl:    folic acid (FOLVITE) 1 MG tablet, TAKE 1 TABLET BY MOUTH EVERY DAY, Disp: 90 tablet, Rfl: 3   furosemide (LASIX) 20 MG tablet, Take 1 tablet by mouth daily., Disp: , Rfl:    metoprolol succinate (TOPROL-XL) 50 MG 24 hr   tablet, Take 50 mg by mouth every morning., Disp: , Rfl:    montelukast (SINGULAIR) 10 MG tablet, Take 1 tablet by mouth at bedtime., Disp: , Rfl:    spironolactone (ALDACTONE) 25 MG tablet, Take 25 mg by mouth daily., Disp: , Rfl:     torsemide (DEMADEX) 20 MG tablet, Take 20 mg by mouth daily., Disp: , Rfl:    zolpidem (AMBIEN) 5 MG tablet, Take 0.5 tablets by mouth at bedtime as needed., Disp: , Rfl:    azelastine (ASTELIN) 0.1 % nasal spray, Place 2 sprays into both nostrils 2 (two) times daily. Use in each nostril as directed (Patient not taking: Reported on 08/17/2022), Disp: 30 mL, Rfl: 2   budesonide-formoterol (SYMBICORT) 80-4.5 MCG/ACT inhaler, Inhale 2 puffs into the lungs 2 (two) times daily as needed. (Patient not taking: Reported on 08/17/2022), Disp: , Rfl:    fluticasone-salmeterol (ADVAIR) 250-50 MCG/ACT AEPB, Inhale 1 puff into the lungs as needed. (Patient not taking: Reported on 09/27/2022), Disp: , Rfl:  No current facility-administered medications for this visit.  Facility-Administered Medications Ordered in Other Visits:    heparin lock flush 100 UNIT/ML injection, , , ,    heparin lock flush 100 UNIT/ML injection, , , ,    heparin lock flush 100 UNIT/ML injection, , , ,    heparin lock flush 100 UNIT/ML injection, , , ,    heparin lock flush 100 unit/mL, 500 Units, Intravenous, Once, Rao, Archana C, MD   sodium chloride flush (NS) 0.9 % injection 10 mL, 10 mL, Intravenous, Once, Rao, Archana C, MD   sodium chloride flush (NS) 0.9 % injection 10 mL, 10 mL, Intravenous, PRN, Rao, Archana C, MD, 10 mL at 01/26/21 0807   sodium chloride flush (NS) 0.9 % injection 10 mL, 10 mL, Intravenous, Once, Rao, Archana C, MD   Subjective:   PATIENT ID: Katherine Perry GENDER: female DOB: 11/17/1955, MRN: [id number]  Chief Complaint  Patient presents with   Follow-up    Breathing is ok. No SOB. Little wheezing. Dry cough.    HPI  Katherine Perry is a pleasant 66 year old female with a history of non-small cell lung cancer s/p chemo-radiation with a recurrent pleural effusion who is presenting to clinic for follow up. I had seen Katherine Perry in consultation in the hospital in December of 2023.  She had developed a recurrent  right-sided pleural effusion status post multiple thoracentesis with interventional radiology. The pleural fluid had been exudative but cytology remained negative.  She was diagnosed with lung cancer in April 2022 (biopsy at that time of the right lower lobe showed non-small cell lung cancer consistent with adenocarcinoma) and received chemoradiation in coordination with oncology and radiation oncology. She underwent thoracentesis in June 2023 (650 mL), September 2023 (1 L), October 2023 (1.2 L), and December 2023 (1.5 L), January 2024 (1.2 L), and February 2024 (1 L).  The pleural fluid was exudative by lights criteria (LDH and protein sent) while cytology has been negative.   She reports that her shortness of breath slowly gets worse between thoracentesis and she could feel that she is going to need it.  She mostly complains of shortness of breath at rest as well as with exertion in addition to a cough.  The improvement in the shortness of breath is quick following the thoracentesis. She was found to have a post procedural pneumothorax following December's thoracentesis for which she required a visit to the ED and a quick admission for observation.     She has been followed by Dr. Aleskerov at [facility] and the patient had asked for a second opinion in regards to the etiology of the pleural effusion.  Following our last visit in January, there was confusion regarding the next steps. We had scheduled a bronchoscopy but this was cancelled given the uncertainty. Katherine Perry is here to discuss this further.  Ancillary information including prior medications, full medical/surgical/family/social histories, and PFTs (when available) are listed below and have been reviewed.   Review of Systems  Constitutional:  Negative for chills and fever.  Respiratory:  Positive for cough and shortness of breath. Negative for sputum production and wheezing.   Cardiovascular:  Negative for chest pain.  Skin:   Negative for rash.     Objective:   Vitals:   09/27/22 1551  BP: 138/82  Pulse: 78  Temp: (!) 97.1 F (36.2 C)  SpO2: 99%  Weight: 143 lb 12.8 oz (65.2 kg)  Height: 5' 5" (1.651 m)   99% on RA  BMI Readings from Last 3 Encounters:  09/27/22 23.93 kg/m  08/17/22 22.96 kg/m  07/19/22 23.63 kg/m   Wt Readings from Last 3 Encounters:  09/27/22 143 lb 12.8 oz (65.2 kg)  08/17/22 138 lb (62.6 kg)  07/19/22 142 lb (64.4 kg)    Physical Exam Constitutional:      Appearance: Normal appearance. She is not ill-appearing.  HENT:     Head: Normocephalic.     Mouth/Throat:     Mouth: Mucous membranes are moist.  Cardiovascular:     Rate and Rhythm: Normal rate and regular rhythm.     Heart sounds: Normal heart sounds.  Pulmonary:     Comments: Decreased breath sounds over the right lower lung field. Good air entry over the left. No wheezing noted. Right lower lung field dull to percussion. Neurological:     General: No focal deficit present.     Mental Status: She is alert and oriented to person, place, and time. Mental status is at baseline.       Ancillary Information    Past Medical History:  Diagnosis Date   Anxiety    Asthma    Cancer (HCC)    Cough    Dyspnea    with exertion   GERD (gastroesophageal reflux disease)    Hypertension    Lung cancer (HCC)      Family History  Problem Relation Age of Onset   Breast cancer Neg Hx      Past Surgical History:  Procedure Laterality Date   BUNIONECTOMY Bilateral    COLONOSCOPY     COLONOSCOPY WITH PROPOFOL N/A 07/30/2021   Procedure: COLONOSCOPY WITH PROPOFOL;  Surgeon: Russo, Steven Michael, DO;  Location: [facility] ENDOSCOPY;  Service: Gastroenterology;  Laterality: N/A;   ESOPHAGOGASTRODUODENOSCOPY  04/01/2006   PORTA CATH INSERTION N/A 12/22/2020   Procedure: PORTA CATH INSERTION;  Surgeon: Dew, Jason S, MD;  Location: [facility] INVASIVE CV LAB;  Service: Cardiovascular;  Laterality: N/A;   PORTA CATH  REMOVAL N/A 05/31/2022   Procedure: PORTA CATH REMOVAL;  Surgeon: Dew, Jason S, MD;  Location: [facility] INVASIVE CV LAB;  Service: Cardiovascular;  Laterality: N/A;   VIDEO BRONCHOSCOPY WITH ENDOBRONCHIAL NAVIGATION N/A 11/07/2020   Procedure: VIDEO BRONCHOSCOPY WITH ENDOBRONCHIAL NAVIGATION;  Surgeon: Aleskerov, Fuad, MD;  Location: [facility] ORS;  Service: Thoracic;  Laterality: N/A;   VIDEO BRONCHOSCOPY WITH ENDOBRONCHIAL ULTRASOUND N/A 11/07/2020   Procedure: VIDEO BRONCHOSCOPY WITH ENDOBRONCHIAL ULTRASOUND;  Surgeon: Aleskerov, Fuad, MD;  Location: [facility]   ORS;  Service: Thoracic;  Laterality: N/A;    Social History   Socioeconomic History   Marital status: Married    Spouse name: Not on file   Number of children: Not on file   Years of education: Not on file   Highest education level: Not on file  Occupational History   Not on file  Tobacco Use   Smoking status: Former    Packs/day: 1.50    Years: 26.80    Total pack years: 40.20    Types: Cigarettes    Quit date: 11/07/1994    Years since quitting: 27.9   Smokeless tobacco: Never  Vaping Use   Vaping Use: Never used  Substance and Sexual Activity   Alcohol use: Yes    Comment:  wine daily   Drug use: Never   Sexual activity: Yes  Other Topics Concern   Not on file  Social History Narrative   Lives with husband, Walter, no pets.   Social Determinants of Health   Financial Resource Strain: Not on file  Food Insecurity: Not on file  Transportation Needs: Not on file  Physical Activity: Not on file  Stress: Not on file  Social Connections: Not on file  Intimate Partner Violence: Not on file     Allergies  Allergen Reactions   Ace Inhibitors Cough     CBC    Component Value Date/Time   WBC 5.2 07/21/2022 0525   RBC 3.90 07/21/2022 0525   HGB 13.2 07/21/2022 0525   HCT 39.4 07/21/2022 0525   PLT 276 07/21/2022 0525   MCV 101.0 (H) 07/21/2022 0525   MCH 33.8 07/21/2022 0525   MCHC 33.5 07/21/2022 0525   RDW 12.9  07/21/2022 0525   LYMPHSABS 1.0 07/19/2022 1818   MONOABS 0.5 07/19/2022 1818   EOSABS 0.2 07/19/2022 1818   BASOSABS 0.1 07/19/2022 1818    Pulmonary Functions Testing Results:     No data to display          Outpatient Medications Prior to Visit  Medication Sig Dispense Refill   acetaminophen (TYLENOL) 500 MG tablet Take 500 mg by mouth every 6 (six) hours as needed.     ALPRAZolam (XANAX) 0.5 MG tablet Take 0.5 mg by mouth at bedtime as needed for sleep.     Cholecalciferol 50 MCG (2000 UT) CAPS Take 2,000 Units by mouth daily.     esomeprazole (NEXIUM) 20 MG capsule Take 20 mg by mouth as needed.     fluticasone (FLONASE) 50 MCG/ACT nasal spray Place 2 sprays into both nostrils daily as needed for rhinitis.     Fluticasone-Umeclidin-Vilant (TRELEGY ELLIPTA) 100-62.5-25 MCG/ACT AEPB Inhale 1 puff into the lungs as needed.     folic acid (FOLVITE) 1 MG tablet TAKE 1 TABLET BY MOUTH EVERY DAY 90 tablet 3   furosemide (LASIX) 20 MG tablet Take 1 tablet by mouth daily.     metoprolol succinate (TOPROL-XL) 50 MG 24 hr tablet Take 50 mg by mouth every morning.     montelukast (SINGULAIR) 10 MG tablet Take 1 tablet by mouth at bedtime.     spironolactone (ALDACTONE) 25 MG tablet Take 25 mg by mouth daily.     torsemide (DEMADEX) 20 MG tablet Take 20 mg by mouth daily.     zolpidem (AMBIEN) 5 MG tablet Take 0.5 tablets by mouth at bedtime as needed.     azelastine (ASTELIN) 0.1 % nasal spray Place 2 sprays into both nostrils 2 (two) times daily. Use   in each nostril as directed (Patient not taking: Reported on 08/17/2022) 30 mL 2   budesonide-formoterol (SYMBICORT) 80-4.5 MCG/ACT inhaler Inhale 2 puffs into the lungs 2 (two) times daily as needed. (Patient not taking: Reported on 08/17/2022)     fluticasone-salmeterol (ADVAIR) 250-50 MCG/ACT AEPB Inhale 1 puff into the lungs as needed. (Patient not taking: Reported on 09/27/2022)     Facility-Administered Medications Prior to Visit   Medication Dose Route Frequency Provider Last Rate Last Admin   heparin lock flush 100 UNIT/ML injection            heparin lock flush 100 UNIT/ML injection            heparin lock flush 100 UNIT/ML injection            heparin lock flush 100 UNIT/ML injection            heparin lock flush 100 unit/mL  500 Units Intravenous Once Rao, Archana C, MD       sodium chloride flush (NS) 0.9 % injection 10 mL  10 mL Intravenous Once Rao, Archana C, MD       sodium chloride flush (NS) 0.9 % injection 10 mL  10 mL Intravenous PRN Rao, Archana C, MD   10 mL at 01/26/21 0807   sodium chloride flush (NS) 0.9 % injection 10 mL  10 mL Intravenous Once Rao, Archana C, MD       

## 2022-09-28 NOTE — Telephone Encounter (Signed)
Attempting to schedule bronch AM of 10/13/2022. Spoke to [place] in [place]. Time does not release until 5:00 tomorrow. She will call me Thursday morning with availability.

## 2022-09-29 NOTE — Telephone Encounter (Signed)
Received call from Annia Belt 10/13/2022 at 8:00 works for her schedule. Will await to hear back from OR.

## 2022-09-30 NOTE — Telephone Encounter (Signed)
Patient is aware of date/time of bronchoscopy and voiced her understanding. She is aware that I will contact her later today with date/time of covid test and pre admit.

## 2022-09-30 NOTE — Telephone Encounter (Signed)
For the codes 31628, [id number], [phone] Prior Auth Not Required Refer # [id number]

## 2022-09-30 NOTE — Telephone Encounter (Signed)
Phone pre admit 10/05/2022 between 1-5 and covid test 10/11/22 between 8-12.  Patient is aware of dates/times and voiced her understanding.  Nothing further needed.

## 2022-09-30 NOTE — Telephone Encounter (Signed)
Flexible bronchoscopy with EBUS scheduled 10/13/2022 at 8:00 Dx:hx of lung malignancy-rule out recurrence  Cpt:31628,[id number]  Rodena Piety, please see bronch info.

## 2022-09-30 NOTE — Telephone Encounter (Signed)
10/13/2022 at 8:00 works or OR.   Dr. Genia Harold, please advise if EBUS is needed as well?

## 2022-10-05 ENCOUNTER — Encounter
Admission: RE | Admit: 2022-10-05 | Discharge: 2022-10-05 | Disposition: A | Discharge status: Home / Self Care | Payer: Medicare Other | Source: Ambulatory Visit | Attending: Student in an Organized Health Care Education/Training Program | Admitting: Student in an Organized Health Care Education/Training Program

## 2022-10-05 ENCOUNTER — Other Ambulatory Visit: Payer: Self-pay

## 2022-10-05 NOTE — Patient Instructions (Signed)
Your procedure is scheduled on: Wednesday 10/13/22 To find out your arrival time, please call [phone] between [place] on:   Tuesday 10/12/22 Report to the Registration Desk on the 1st floor of the [facility]. Valet parking is available.  If your arrival time is 6:00 am, do not arrive before that time as the [facility] entrance doors do not open until 6:00 am.  REMEMBER: Instructions that are not followed completely may result in serious medical risk, up to and including death; or upon the discretion of your surgeon and anesthesiologist your surgery may need to be rescheduled.  Do not eat food or drink any liquids after midnight the night before surgery.  No gum chewing or hard candies.  One week prior to surgery: Stop Anti-inflammatories (NSAIDS) such as Advil, Aleve, Ibuprofen, Motrin, Naproxen, Naprosyn and Aspirin based products such as Excedrin, Goody's Powder, BC Powder. You may however, continue to take Tylenol if needed for pain up until the day of surgery.  Stop ANY OVER THE COUNTER supplements until after surgery.  Continue taking all prescribed medications.   TAKE ONLY THESE MEDICATIONS THE MORNING OF SURGERY WITH A SIP OF WATER:  metoprolol succinate (TOPROL-XL) 50 MG 24 hr tablet  esomeprazole (NEXIUM) 20 MG capsule   Antacid (take one the night before and one on the morning of surgery - helps to prevent nausea after surgery.)  Use inhalers on the day of surgery and bring to the hospital.  No Alcohol for 24 hours before or after surgery.  No Smoking including e-cigarettes for 24 hours before surgery.  No chewable tobacco products for at least 6 hours before surgery.  No nicotine patches on the day of surgery.  Do not use any "recreational" drugs for at least a week (preferably 2 weeks) before your surgery.  Please be advised that the combination of cocaine and anesthesia may have negative outcomes, up to and including death. If you test positive for cocaine,  your surgery will be cancelled.  On the morning of surgery brush your teeth with toothpaste and water, you may rinse your mouth with mouthwash if you wish. Do not swallow any toothpaste or mouthwash.  Use CHG Soap or wipes as directed on instruction sheet.  Shower with your regular soap the morning of your procedure.  Do not wear lotions, powders, or perfumes. You can use deodorant.  Do not shave body hair from the neck down 48 hours before surgery.  Wear comfortable clothing (specific to your surgery type) to the hospital.  Do not wear jewelry, make-up, hairpins, clips or nail polish.  Contact lenses, hearing aids and dentures may not be worn into surgery.  Do not bring valuables to the hospital. [facility] is not responsible for any missing/lost belongings or valuables.   Notify your doctor if there is any change in your medical condition (cold, fever, infection).  If you are being discharged the day of surgery, you will not be allowed to drive home. You will need a responsible individual to drive you home and stay with you for 24 hours after surgery.   If you are taking public transportation, you will need to have a responsible individual with you.  If you are being admitted to the hospital overnight, leave your suitcase in the car. After surgery it may be brought to your room.  In case of increased patient census, it may be necessary for you, the patient, to continue your postoperative care in the Same Day Surgery department.  After surgery, you can help prevent lung complications by doing breathing exercises.  Take deep breaths and cough every 1-2 hours. Your doctor may order a device called an Incentive Spirometer to help you take deep breaths. When coughing or sneezing, hold a pillow firmly against your incision with both hands. This is called "splinting." Doing this helps protect your incision. It also decreases belly discomfort.  Surgery Visitation Policy:  Patients  undergoing a surgery or procedure may have two family members or support persons with them as long as the person is not COVID-19 positive or experiencing its symptoms.   Inpatient Visitation:    Visiting hours are 7 a.m. to 8 p.m. Up to four visitors are allowed at one time in a patient room. The visitors may rotate out with other people during the day. One designated support person (adult) may remain overnight.  Due to an increase in RSV and influenza rates and associated hospitalizations, children ages 23 and under will not be able to visit patients in [facility]. Masks continue to be strongly recommended.  Please call the [place] Dept. at [phone] if you have any questions about these instructions.

## 2022-10-08 ENCOUNTER — Other Ambulatory Visit: Payer: Self-pay | Admitting: Internal Medicine

## 2022-10-08 ENCOUNTER — Ambulatory Visit
Admission: RE | Admit: 2022-10-08 | Discharge: 2022-10-08 | Disposition: A | Discharge status: Home / Self Care | Payer: Medicare Other | Source: Ambulatory Visit | Attending: Internal Medicine | Admitting: Internal Medicine

## 2022-10-08 DIAGNOSIS — C3491 Malignant neoplasm of unspecified part of right bronchus or lung: Secondary | ICD-10-CM | POA: Diagnosis not present

## 2022-10-08 DIAGNOSIS — R0781 Pleurodynia: Secondary | ICD-10-CM | POA: Insufficient documentation

## 2022-10-08 DIAGNOSIS — R0609 Other forms of dyspnea: Secondary | ICD-10-CM | POA: Diagnosis present

## 2022-10-08 MED ORDER — IOHEXOL 350 MG/ML SOLN
75.0000 mL | Freq: Once | INTRAVENOUS | Status: AC | PRN
  Administered 2022-10-08: 75 mL via INTRAVENOUS

## 2022-10-11 ENCOUNTER — Encounter
Admission: RE | Admit: 2022-10-11 | Discharge: 2022-10-11 | Disposition: A | Discharge status: Home / Self Care | Payer: Medicare Other | Source: Ambulatory Visit | Attending: Student in an Organized Health Care Education/Training Program | Admitting: Student in an Organized Health Care Education/Training Program

## 2022-10-11 DIAGNOSIS — Z01812 Encounter for preprocedural laboratory examination: Secondary | ICD-10-CM | POA: Diagnosis not present

## 2022-10-11 DIAGNOSIS — Z1152 Encounter for screening for COVID-19: Secondary | ICD-10-CM | POA: Insufficient documentation

## 2022-10-11 DIAGNOSIS — Z01818 Encounter for other preprocedural examination: Secondary | ICD-10-CM

## 2022-10-12 LAB — SARS CORONAVIRUS 2 (TAT 6-24 HRS): SARS Coronavirus 2: NEGATIVE

## 2022-10-12 MED ORDER — LACTATED RINGERS IV SOLN: INTRAVENOUS | Status: DC

## 2022-10-12 MED ORDER — CHLORHEXIDINE GLUCONATE 0.12 % MT SOLN: 15.0000 mL | Freq: Once | OROMUCOSAL | Status: AC

## 2022-10-12 MED ORDER — ORAL CARE MOUTH RINSE: 15.0000 mL | Freq: Once | OROMUCOSAL | Status: AC

## 2022-10-13 ENCOUNTER — Other Ambulatory Visit: Payer: Self-pay

## 2022-10-13 ENCOUNTER — Ambulatory Visit: Payer: Medicare Other

## 2022-10-13 ENCOUNTER — Ambulatory Visit: Payer: Medicare Other | Admitting: Registered Nurse

## 2022-10-13 ENCOUNTER — Ambulatory Visit: Payer: Medicare Other | Admitting: Urgent Care

## 2022-10-13 ENCOUNTER — Encounter: Payer: Self-pay | Admitting: Student in an Organized Health Care Education/Training Program

## 2022-10-13 ENCOUNTER — Encounter
Admission: RE | Disposition: A | Discharge status: Home / Self Care | Payer: Self-pay | Source: Home / Self Care | Attending: Student in an Organized Health Care Education/Training Program

## 2022-10-13 ENCOUNTER — Ambulatory Visit
Admission: RE | Admit: 2022-10-13 | Discharge: 2022-10-13 | Disposition: A | Discharge status: Home / Self Care | Payer: Medicare Other | Attending: Student in an Organized Health Care Education/Training Program | Admitting: Student in an Organized Health Care Education/Training Program

## 2022-10-13 DIAGNOSIS — J90 Pleural effusion, not elsewhere classified: Secondary | ICD-10-CM | POA: Insufficient documentation

## 2022-10-13 DIAGNOSIS — R918 Other nonspecific abnormal finding of lung field: Secondary | ICD-10-CM | POA: Insufficient documentation

## 2022-10-13 DIAGNOSIS — I10 Essential (primary) hypertension: Secondary | ICD-10-CM | POA: Diagnosis not present

## 2022-10-13 DIAGNOSIS — J45909 Unspecified asthma, uncomplicated: Secondary | ICD-10-CM | POA: Insufficient documentation

## 2022-10-13 DIAGNOSIS — K219 Gastro-esophageal reflux disease without esophagitis: Secondary | ICD-10-CM | POA: Diagnosis not present

## 2022-10-13 DIAGNOSIS — Z87891 Personal history of nicotine dependence: Secondary | ICD-10-CM | POA: Insufficient documentation

## 2022-10-13 DIAGNOSIS — C3431 Malignant neoplasm of lower lobe, right bronchus or lung: Secondary | ICD-10-CM

## 2022-10-13 HISTORY — PX: VIDEO BRONCHOSCOPY WITH ENDOBRONCHIAL ULTRASOUND: SHX6177

## 2022-10-13 HISTORY — PX: FLEXIBLE BRONCHOSCOPY: SHX5094

## 2022-10-13 SURGERY — BRONCHOSCOPY, FLEXIBLE
Anesthesia: General | Laterality: Right

## 2022-10-13 MED ORDER — ONDANSETRON HCL 4 MG/2ML IJ SOLN
INTRAMUSCULAR | Status: DC | PRN
  Administered 2022-10-13: 4 mg via INTRAVENOUS

## 2022-10-13 MED ORDER — ROCURONIUM BROMIDE 100 MG/10ML IV SOLN
INTRAVENOUS | Status: DC | PRN
  Administered 2022-10-13: 20 mg via INTRAVENOUS

## 2022-10-13 MED ORDER — DEXMEDETOMIDINE HCL IN NACL 200 MCG/50ML IV SOLN
INTRAVENOUS | Status: DC | PRN
  Administered 2022-10-13: 8 ug via INTRAVENOUS
  Administered 2022-10-13: 4 ug via INTRAVENOUS
  Administered 2022-10-13: 8 ug via INTRAVENOUS

## 2022-10-13 MED ORDER — FENTANYL CITRATE (PF) 100 MCG/2ML IJ SOLN: 25.0000 ug | INTRAMUSCULAR | Status: DC | PRN

## 2022-10-13 MED ORDER — ONDANSETRON HCL 4 MG/2ML IJ SOLN: 4.0000 mg | Freq: Once | INTRAMUSCULAR | Status: DC | PRN

## 2022-10-13 MED ORDER — GLYCOPYRROLATE 0.2 MG/ML IJ SOLN
INTRAMUSCULAR | Status: AC
  Filled 2022-10-13: qty 1

## 2022-10-13 MED ORDER — PROPOFOL 10 MG/ML IV BOLUS
INTRAVENOUS | Status: DC | PRN
  Administered 2022-10-13: 30 mg via INTRAVENOUS
  Administered 2022-10-13: 90 mg via INTRAVENOUS
  Administered 2022-10-13: 20 mg via INTRAVENOUS
  Administered 2022-10-13 (×2): 30 mg via INTRAVENOUS

## 2022-10-13 MED ORDER — CHLORHEXIDINE GLUCONATE 0.12 % MT SOLN
OROMUCOSAL | Status: AC
  Administered 2022-10-13: 15 mL via OROMUCOSAL
  Filled 2022-10-13: qty 15

## 2022-10-13 MED ORDER — FENTANYL CITRATE (PF) 100 MCG/2ML IJ SOLN
INTRAMUSCULAR | Status: DC | PRN
  Administered 2022-10-13: 50 ug via INTRAVENOUS

## 2022-10-13 MED ORDER — FENTANYL CITRATE (PF) 100 MCG/2ML IJ SOLN
INTRAMUSCULAR | Status: AC
  Filled 2022-10-13: qty 2

## 2022-10-13 MED ORDER — MIDAZOLAM HCL 2 MG/2ML IJ SOLN
INTRAMUSCULAR | Status: DC | PRN
  Administered 2022-10-13: 2 mg via INTRAVENOUS

## 2022-10-13 MED ORDER — DEXAMETHASONE SODIUM PHOSPHATE 10 MG/ML IJ SOLN
INTRAMUSCULAR | Status: DC | PRN
  Administered 2022-10-13: 10 mg via INTRAVENOUS

## 2022-10-13 MED ORDER — MIDAZOLAM HCL 2 MG/2ML IJ SOLN
INTRAMUSCULAR | Status: AC
  Filled 2022-10-13: qty 2

## 2022-10-13 MED ORDER — PROPOFOL 1000 MG/100ML IV EMUL
INTRAVENOUS | Status: AC
  Filled 2022-10-13: qty 100

## 2022-10-13 MED ORDER — SUGAMMADEX SODIUM 200 MG/2ML IV SOLN
INTRAVENOUS | Status: DC | PRN
  Administered 2022-10-13: 128.8 mg via INTRAVENOUS

## 2022-10-13 MED ORDER — PROPOFOL 10 MG/ML IV BOLUS
INTRAVENOUS | Status: AC
  Filled 2022-10-13: qty 20

## 2022-10-13 MED ORDER — PROPOFOL 500 MG/50ML IV EMUL
INTRAVENOUS | Status: DC | PRN
  Administered 2022-10-13: 150 ug/kg/min via INTRAVENOUS

## 2022-10-13 NOTE — Anesthesia Procedure Notes (Signed)
Procedure Name: Intubation Date/Time: 10/13/2022 8:43 AM  Performed by: Doreen Salvage, CRNAPre-anesthesia Checklist: Patient identified, Patient being monitored, Timeout performed, Emergency Drugs available and Suction available Patient Re-evaluated:Patient Re-evaluated prior to induction Oxygen Delivery Method: Circle system utilized Preoxygenation: Pre-oxygenation with 100% oxygen Induction Type: IV induction Ventilation: Mask ventilation without difficulty Laryngoscope Size: Mac and 3 Grade View: Grade I Tube type: Oral Tube size: 8.5 mm Number of attempts: 1 Airway Equipment and Method: Stylet Placement Confirmation: ETT inserted through vocal cords under direct vision, positive ETCO2 and breath sounds checked- equal and bilateral Secured at: 23 cm Tube secured with: Tape Dental Injury: Teeth and Oropharynx as per pre-operative assessment

## 2022-10-13 NOTE — Op Note (Signed)
Flexible and EBUS Bronchoscopy Procedure Note  Katherine Perry  JP:[id number]  1955/12/09  Date:10/13/22  Time:9:24 AM   Provider Performing:Brandy Zuba   Procedure: Flexible bronchoscopy and EBUS Bronchoscopy  Indication(s) RLL mass, recurrent pleural effusion  Consent Risks of the procedure as well as the alternatives and risks of each were explained to the patient and/or caregiver.  Consent for the procedure was obtained.  Anesthesia General Anesthesia   Time Out Verified patient identification, verified procedure, site/side was marked, verified correct patient position, special equipment/implants available, medications/allergies/relevant history reviewed, required imaging and test results available.   Sterile Technique Usual hand hygiene, masks, gowns, and gloves were used   Procedure Description Diagnostic bronchoscope advanced through endotracheal tube and into airway.  Airways were examined down to subsegmental level. There were no abnormalities noted in the left tracheobronchial tree. The right tracheobronchial tree was examined, and extrinsic compression of the RLL bronchus was noted. There were no endobronchial lesions.  The diagnostic bronchoscope was then removed and the EBUS bronchoscope was advanced into airway with station 11R superior biopsies. The RLL bronchus was examined with the EBUS and a mass was identified and biopsied. Samples were sent for slide and cell block.  The EBUS bronchoscope was removed after assuring no active bleeding from biopsy site.  Findings: Enlarged 11R suprior, RLL mass, both biopsies  Extrinsic Compression of the RLL   EBUS to 11R superior     Complications/Tolerance None; patient tolerated the procedure well. Chest X-ray is needed post procedure.   EBL Minimal   Specimen(s) EBUS to 11R superior (4 passes) EBUS to RLL mass (4 passes)  Armando Reichert, MD [facility] 10/13/2022 9:50 AM

## 2022-10-13 NOTE — Anesthesia Preprocedure Evaluation (Signed)
Anesthesia Evaluation  Patient identified by MRN, date of birth, ID band Patient awake    Reviewed: Allergy & Precautions, NPO status , Patient's Chart, lab work & pertinent test results  History of Anesthesia Complications Negative for: history of anesthetic complications  Airway Mallampati: III   Neck ROM: Full    Dental  (+) Dental Advidsory Given, Teeth Intact   Pulmonary shortness of breath and with exertion, asthma , neg sleep apnea, COPD,  COPD inhaler, neg recent URI, former smoker Lung CA, not a surgical candidate   Pulmonary exam normal breath sounds clear to auscultation       Cardiovascular Exercise Tolerance: Good hypertension, (-) angina (-) Past MI and (-) Cardiac Stents Normal cardiovascular exam(-) dysrhythmias (-) Valvular Problems/Murmurs Rhythm:Regular Rate:Normal  ECG 11/07/20:  Normal sinus rhythm Nonspecific ST abnormality   Neuro/Psych  PSYCHIATRIC DISORDERS Anxiety     negative neurological ROS     GI/Hepatic Neg liver ROS,GERD  ,,  Endo/Other  negative endocrine ROS    Renal/GU negative Renal ROS     Musculoskeletal   Abdominal   Peds  Hematology negative hematology ROS (+)   Anesthesia Other Findings Past Medical History: No date: Anxiety No date: Asthma No date: Cancer ([place]) No date: Cough No date: Dyspnea     Comment:  with exertion No date: GERD (gastroesophageal reflux disease) No date: Hypertension No date: Lung cancer ([place])   Reproductive/Obstetrics                             Anesthesia Physical Anesthesia Plan  ASA: 3  Anesthesia Plan: General   Post-op Pain Management:    Induction: Intravenous  PONV Risk Score and Plan: 3 and Treatment may vary due to age or medical condition, Ondansetron, Dexamethasone and Midazolam  Airway Management Planned: Oral ETT  Additional Equipment:   Intra-op Plan:   Post-operative Plan: Extubation in  OR  Informed Consent: I have reviewed the patients History and Physical, chart, labs and discussed the procedure including the risks, benefits and alternatives for the proposed anesthesia with the patient or authorized representative who has indicated his/her understanding and acceptance.       Plan Discussed with: CRNA  Anesthesia Plan Comments:         Anesthesia Quick Evaluation

## 2022-10-13 NOTE — Procedures (Signed)
Thoracentesis  Procedure Note  [name]  [id number]  [birth year]  Date:10/13/22  Time:11:10 AM   Provider Performing:Gurpreet Mikhail   Procedure: Thoracentesis with imaging guidance [id number])  Indication(s) Pleural Effusion  Consent Risks of the procedure as well as the alternatives and risks of each were explained to the patient and/or caregiver.  Consent for the procedure was obtained and is signed in the bedside chart  Anesthesia Topical only with 1% lidocaine    Time Out Verified patient identification, verified procedure, site/side was marked, verified correct patient position, special equipment/implants available, medications/allergies/relevant history reviewed, required imaging and test results available.   Sterile Technique Maximal sterile technique including full sterile barrier drape, hand hygiene, sterile gown, sterile gloves, mask, hair covering, sterile ultrasound probe cover (if used).  Procedure Description Ultrasound was used to identify appropriate pleural anatomy for placement and overlying skin marked.  Area of drainage cleaned and draped in sterile fashion. Lidocaine was used to anesthetize the skin and subcutaneous tissue.  1000 cc's of yellow appearing fluid was drained from the right pleural space. Catheter then removed and bandaid applied to site.    Complications/Tolerance None; patient tolerated the procedure well. Chest X-ray is ordered to confirm no post-procedural complication.   EBL Minimal   Specimen(s) Pleural fluid for cytology  Armando Reichert, MD [facility] 10/13/2022 11:10 AM

## 2022-10-13 NOTE — Anesthesia Postprocedure Evaluation (Signed)
Anesthesia Post Note  Patient: Katherine Perry  Procedure(s) Performed: FLEXIBLE BRONCHOSCOPY (Right) VIDEO BRONCHOSCOPY WITH ENDOBRONCHIAL ULTRASOUND (Right)  Patient location during evaluation: PACU Anesthesia Type: General Level of consciousness: awake and alert Pain management: pain level controlled Vital Signs Assessment: post-procedure vital signs reviewed and stable Respiratory status: spontaneous breathing, nonlabored ventilation, respiratory function stable and patient connected to nasal cannula oxygen Cardiovascular status: blood pressure returned to baseline and stable Postop Assessment: no apparent nausea or vomiting Anesthetic complications: no   No notable events documented.   Last Vitals:  Vitals:   10/13/22 1130 10/13/22 1150  BP: (!) 140/76 134/71  Pulse: (!) 103 (!) 109  Resp: 18 15  Temp:  36.6 C  SpO2: 99% 97%    Last Pain:  Vitals:   10/13/22 1150  TempSrc: Temporal  PainSc: 0-No pain                 Martha Clan

## 2022-10-13 NOTE — Progress Notes (Signed)
Per Dr. Genia Harold patient is good to go home, CXR post thoracentesis looks great. VSS.

## 2022-10-13 NOTE — Transfer of Care (Signed)
Immediate Anesthesia Transfer of Care Note  Patient: Katherine Perry  Procedure(s) Performed: Procedure(s): FLEXIBLE BRONCHOSCOPY (Right) VIDEO BRONCHOSCOPY WITH ENDOBRONCHIAL ULTRASOUND (Right)  Patient Location: PACU  Anesthesia Type:General  Level of Consciousness: sedated  Airway & Oxygen Therapy: Patient Spontanous Breathing and Patient connected to face mask oxygen  Post-op Assessment: Report given to RN and Post -op Vital signs reviewed and stable  Post vital signs: Reviewed and stable  Last Vitals:  Vitals:   10/13/22 0738 10/13/22 0929  BP: 138/73 (!) 111/52  Pulse: (!) 124 (!) 113  Resp: 18 16  Temp: 37 C   SpO2: [id number]    Complications: No apparent anesthesia complications

## 2022-10-13 NOTE — Discharge Instructions (Signed)
AMBULATORY SURGERY  DISCHARGE INSTRUCTIONS   The drugs that you were given will stay in your system until tomorrow so for the next 24 hours you should not:  Drive an automobile Make any legal decisions Drink any alcoholic beverage   You may resume regular meals tomorrow.  Today it is better to start with liquids and gradually work up to solid foods.  You may eat anything you prefer, but it is better to start with liquids, then soup and crackers, and gradually work up to solid foods.   Please notify your doctor immediately if you have any unusual bleeding, trouble breathing, redness and pain at the surgery site, drainage, fever, or pain not relieved by medication.       Please contact your physician with any problems or Same Day Surgery at [phone], Monday through Friday 6 am to 4 pm, or [facility] number at [phone].  

## 2022-10-13 NOTE — Interval H&P Note (Signed)
Patient seen and examined. She has a recurrent right sided pleural effusion and a history of lung malignancy. She is here for the airway survey and evaluation. Patient is appropriate for the procedure.

## 2022-10-14 ENCOUNTER — Encounter: Payer: Self-pay | Admitting: Student in an Organized Health Care Education/Training Program

## 2022-10-14 LAB — CYTOLOGY - NON PAP

## 2022-10-28 ENCOUNTER — Ambulatory Visit
Admission: RE | Admit: 2022-10-28 | Discharge: 2022-10-28 | Disposition: A | Discharge status: Home / Self Care | Payer: Medicare Other | Source: Ambulatory Visit | Attending: Internal Medicine | Admitting: Internal Medicine

## 2022-10-28 ENCOUNTER — Telehealth: Payer: Self-pay | Admitting: Student in an Organized Health Care Education/Training Program

## 2022-10-28 ENCOUNTER — Other Ambulatory Visit: Payer: Self-pay | Admitting: Internal Medicine

## 2022-10-28 ENCOUNTER — Ambulatory Visit
Admission: RE | Admit: 2022-10-28 | Discharge: 2022-10-28 | Disposition: A | Discharge status: Home / Self Care | Payer: Medicare Other | Source: Ambulatory Visit | Attending: Pulmonary Disease | Admitting: Pulmonary Disease

## 2022-10-28 DIAGNOSIS — R059 Cough, unspecified: Secondary | ICD-10-CM | POA: Insufficient documentation

## 2022-10-28 DIAGNOSIS — J90 Pleural effusion, not elsewhere classified: Secondary | ICD-10-CM

## 2022-10-28 DIAGNOSIS — Z9889 Other specified postprocedural states: Secondary | ICD-10-CM

## 2022-10-28 MED ORDER — LIDOCAINE HCL (PF) 1 % IJ SOLN
8.0000 mL | Freq: Once | INTRAMUSCULAR | Status: AC
  Administered 2022-10-28: 8 mL via INTRADERMAL

## 2022-10-28 NOTE — Telephone Encounter (Addendum)
Called and spoke to patient.  She feels that she has fluid on lungs. C/o dry cough, wheezing, decreased energy, fatigued and  increased SOB.  Denied f/c/s or additional sx.  She used advair once daily and albuterol every other day.  Hx of pleural effusion.   Dr. Patsey Berthold, please advise. Thanks Dr. Genia Harold is unavailable.

## 2022-10-28 NOTE — Procedures (Signed)
PROCEDURE SUMMARY:  Successful US guided therapeutic right thoracentesis. Yielded 1 L of clear, yellow fluid. Pt tolerated procedure well. No immediate complications.  Specimen not sent for labs. CXR ordered.  EBL < 1 mL  Tyson Alias, AGNP 10/28/2022 12:13 PM

## 2022-10-28 NOTE — Telephone Encounter (Signed)
Called and spoke to relayed below message. She refused to go to ED. She would like IR to do to the thoracentesis. Per Dr. Patsey Berthold verbally, okay to order thoracentesis through IR.  Order has been placed. Patient is aware and voiced her understanding.  Nothing further needed.

## 2022-10-28 NOTE — Telephone Encounter (Signed)
Patient states has fluid on lungs. States having symptom of cough. Patient phone number is [phone].

## 2022-10-28 NOTE — Telephone Encounter (Signed)
Lets have her come through the ED so she can be imaged and tapped.  Unfortunately our clinic schedule today is full to capacity.

## 2022-11-01 ENCOUNTER — Encounter: Payer: Self-pay | Admitting: Student in an Organized Health Care Education/Training Program

## 2022-11-01 DIAGNOSIS — J90 Pleural effusion, not elsewhere classified: Secondary | ICD-10-CM

## 2022-11-02 ENCOUNTER — Other Ambulatory Visit
Admission: RE | Admit: 2022-11-02 | Discharge: 2022-11-02 | Disposition: A | Discharge status: Home / Self Care | Payer: Medicare Other | Source: Home / Self Care | Attending: Student in an Organized Health Care Education/Training Program | Admitting: Student in an Organized Health Care Education/Training Program

## 2022-11-02 ENCOUNTER — Ambulatory Visit
Admission: RE | Admit: 2022-11-02 | Discharge: 2022-11-02 | Disposition: A | Discharge status: Home / Self Care | Payer: Medicare Other | Source: Ambulatory Visit | Attending: Student in an Organized Health Care Education/Training Program | Admitting: Student in an Organized Health Care Education/Training Program

## 2022-11-02 ENCOUNTER — Ambulatory Visit
Admission: RE | Admit: 2022-11-02 | Discharge: 2022-11-02 | Disposition: A | Discharge status: Home / Self Care | Payer: Medicare Other | Source: Home / Self Care | Attending: Student in an Organized Health Care Education/Training Program | Admitting: Student in an Organized Health Care Education/Training Program

## 2022-11-02 DIAGNOSIS — J90 Pleural effusion, not elsewhere classified: Secondary | ICD-10-CM

## 2022-11-02 DIAGNOSIS — I3139 Other pericardial effusion (noninflammatory): Secondary | ICD-10-CM | POA: Diagnosis not present

## 2022-11-02 DIAGNOSIS — I480 Paroxysmal atrial fibrillation: Secondary | ICD-10-CM | POA: Diagnosis not present

## 2022-11-02 LAB — CBC WITH DIFFERENTIAL/PLATELET
Abs Immature Granulocytes: 0.02 10*3/uL (ref 0.00–0.07)
Basophils Absolute: 0 10*3/uL (ref 0.0–0.1)
Basophils Relative: 1 %
Eosinophils Absolute: 0 10*3/uL (ref 0.0–0.5)
Eosinophils Relative: 0 %
HCT: 34.7 % — ABNORMAL LOW (ref 36.0–46.0)
Hemoglobin: 11.7 g/dL — ABNORMAL LOW (ref 12.0–15.0)
Immature Granulocytes: 0 %
Lymphocytes Relative: 11 %
Lymphs Abs: 0.7 10*3/uL (ref 0.7–4.0)
MCH: 32.5 pg (ref 26.0–34.0)
MCHC: 33.7 g/dL (ref 30.0–36.0)
MCV: 96.4 fL (ref 80.0–100.0)
Monocytes Absolute: 0.6 10*3/uL (ref 0.1–1.0)
Monocytes Relative: 10 %
Neutro Abs: 4.7 10*3/uL (ref 1.7–7.7)
Neutrophils Relative %: 78 %
Platelets: 432 10*3/uL — ABNORMAL HIGH (ref 150–400)
RBC: 3.6 MIL/uL — ABNORMAL LOW (ref 3.87–5.11)
RDW: 12.7 % (ref 11.5–15.5)
WBC: 6 10*3/uL (ref 4.0–10.5)
nRBC: 0 % (ref 0.0–0.2)

## 2022-11-02 LAB — BASIC METABOLIC PANEL
Anion gap: 12 (ref 5–15)
BUN: 8 mg/dL (ref 8–23)
CO2: 27 mmol/L (ref 22–32)
Calcium: 8.9 mg/dL (ref 8.9–10.3)
Chloride: 92 mmol/L — ABNORMAL LOW (ref 98–111)
Creatinine, Ser: 0.73 mg/dL (ref 0.44–1.00)
GFR, Estimated: >60 mL/min (ref >60)
Glucose, Bld: 180 mg/dL — ABNORMAL HIGH (ref 70–99)
Potassium: 3.4 mmol/L — ABNORMAL LOW (ref 3.5–5.1)
Sodium: 131 mmol/L — ABNORMAL LOW (ref 135–145)

## 2022-11-02 LAB — TSH: TSH: 0.673 u[IU]/mL (ref 0.350–4.500)

## 2022-11-04 ENCOUNTER — Encounter: Payer: Self-pay | Admitting: Internal Medicine

## 2022-11-04 ENCOUNTER — Other Ambulatory Visit: Payer: Self-pay

## 2022-11-04 ENCOUNTER — Encounter: Payer: Self-pay | Admitting: Pulmonary Disease

## 2022-11-04 ENCOUNTER — Ambulatory Visit: Payer: Medicare Other | Admitting: Pulmonary Disease

## 2022-11-04 ENCOUNTER — Emergency Department: Payer: Medicare Other

## 2022-11-04 ENCOUNTER — Inpatient Hospital Stay: Payer: Medicare Other

## 2022-11-04 ENCOUNTER — Other Ambulatory Visit (HOSPITAL_COMMUNITY): Payer: Self-pay

## 2022-11-04 ENCOUNTER — Other Ambulatory Visit: Payer: Medicare Other

## 2022-11-04 ENCOUNTER — Inpatient Hospital Stay
Admission: EM | Admit: 2022-11-04 | Discharge: 2022-11-06 | DRG: 309 | Disposition: A | Discharge status: Home / Self Care | Payer: Medicare Other | Attending: Osteopathic Medicine | Admitting: Osteopathic Medicine

## 2022-11-04 ENCOUNTER — Encounter: Payer: Self-pay | Admitting: Oncology

## 2022-11-04 VITALS — BP 122/80 | HR 183 | Temp 97.1°F | Ht 65.0 in | Wt 146.4 lb

## 2022-11-04 DIAGNOSIS — Z923 Personal history of irradiation: Secondary | ICD-10-CM

## 2022-11-04 DIAGNOSIS — Z7901 Long term (current) use of anticoagulants: Secondary | ICD-10-CM

## 2022-11-04 DIAGNOSIS — I4891 Unspecified atrial fibrillation: Secondary | ICD-10-CM | POA: Diagnosis not present

## 2022-11-04 DIAGNOSIS — Z888 Allergy status to other drugs, medicaments and biological substances status: Secondary | ICD-10-CM

## 2022-11-04 DIAGNOSIS — I480 Paroxysmal atrial fibrillation: Secondary | ICD-10-CM | POA: Diagnosis present

## 2022-11-04 DIAGNOSIS — J45909 Unspecified asthma, uncomplicated: Secondary | ICD-10-CM | POA: Diagnosis present

## 2022-11-04 DIAGNOSIS — Z87891 Personal history of nicotine dependence: Secondary | ICD-10-CM

## 2022-11-04 DIAGNOSIS — C3431 Malignant neoplasm of lower lobe, right bronchus or lung: Secondary | ICD-10-CM | POA: Diagnosis not present

## 2022-11-04 DIAGNOSIS — J449 Chronic obstructive pulmonary disease, unspecified: Secondary | ICD-10-CM | POA: Diagnosis not present

## 2022-11-04 DIAGNOSIS — I4892 Unspecified atrial flutter: Secondary | ICD-10-CM | POA: Diagnosis present

## 2022-11-04 DIAGNOSIS — J90 Pleural effusion, not elsewhere classified: Secondary | ICD-10-CM | POA: Diagnosis present

## 2022-11-04 DIAGNOSIS — I3139 Other pericardial effusion (noninflammatory): Secondary | ICD-10-CM | POA: Diagnosis present

## 2022-11-04 DIAGNOSIS — F419 Anxiety disorder, unspecified: Secondary | ICD-10-CM | POA: Diagnosis present

## 2022-11-04 DIAGNOSIS — Z1152 Encounter for screening for COVID-19: Secondary | ICD-10-CM | POA: Diagnosis not present

## 2022-11-04 DIAGNOSIS — Z7951 Long term (current) use of inhaled steroids: Secondary | ICD-10-CM

## 2022-11-04 DIAGNOSIS — Z23 Encounter for immunization: Secondary | ICD-10-CM

## 2022-11-04 DIAGNOSIS — Z79899 Other long term (current) drug therapy: Secondary | ICD-10-CM

## 2022-11-04 DIAGNOSIS — C3491 Malignant neoplasm of unspecified part of right bronchus or lung: Secondary | ICD-10-CM | POA: Diagnosis present

## 2022-11-04 DIAGNOSIS — K219 Gastro-esophageal reflux disease without esophagitis: Secondary | ICD-10-CM | POA: Diagnosis present

## 2022-11-04 DIAGNOSIS — Z853 Personal history of malignant neoplasm of breast: Secondary | ICD-10-CM

## 2022-11-04 DIAGNOSIS — T465X6A Underdosing of other antihypertensive drugs, initial encounter: Secondary | ICD-10-CM | POA: Diagnosis present

## 2022-11-04 DIAGNOSIS — E875 Hyperkalemia: Secondary | ICD-10-CM | POA: Diagnosis present

## 2022-11-04 DIAGNOSIS — R0602 Shortness of breath: Secondary | ICD-10-CM | POA: Diagnosis not present

## 2022-11-04 DIAGNOSIS — I10 Essential (primary) hypertension: Secondary | ICD-10-CM | POA: Diagnosis present

## 2022-11-04 DIAGNOSIS — Z9221 Personal history of antineoplastic chemotherapy: Secondary | ICD-10-CM | POA: Diagnosis not present

## 2022-11-04 DIAGNOSIS — R000 Tachycardia, unspecified: Secondary | ICD-10-CM | POA: Diagnosis not present

## 2022-11-04 DIAGNOSIS — E876 Hypokalemia: Secondary | ICD-10-CM | POA: Diagnosis not present

## 2022-11-04 DIAGNOSIS — R0609 Other forms of dyspnea: Secondary | ICD-10-CM

## 2022-11-04 LAB — CBC
HCT: 38 % (ref 36.0–46.0)
Hemoglobin: 12.5 g/dL (ref 12.0–15.0)
MCH: 32.3 pg (ref 26.0–34.0)
MCHC: 32.9 g/dL (ref 30.0–36.0)
MCV: 98.2 fL (ref 80.0–100.0)
Platelets: 469 10*3/uL — ABNORMAL HIGH (ref 150–400)
RBC: 3.87 MIL/uL (ref 3.87–5.11)
RDW: 12.8 % (ref 11.5–15.5)
WBC: 6.8 10*3/uL (ref 4.0–10.5)
nRBC: 0 % (ref 0.0–0.2)

## 2022-11-04 LAB — PROTIME-INR
INR: 1.1 (ref 0.8–1.2)
Prothrombin Time: 13.6 seconds (ref 11.4–15.2)

## 2022-11-04 LAB — BASIC METABOLIC PANEL
Anion gap: 13 (ref 5–15)
BUN: 6 mg/dL — ABNORMAL LOW (ref 8–23)
CO2: 26 mmol/L (ref 22–32)
Calcium: 9.1 mg/dL (ref 8.9–10.3)
Chloride: 96 mmol/L — ABNORMAL LOW (ref 98–111)
Creatinine, Ser: 0.63 mg/dL (ref 0.44–1.00)
GFR, Estimated: >60 mL/min (ref >60)
Glucose, Bld: 118 mg/dL — ABNORMAL HIGH (ref 70–99)
Potassium: 3.6 mmol/L (ref 3.5–5.1)
Sodium: 135 mmol/L (ref 135–145)

## 2022-11-04 LAB — APTT: aPTT: 30 seconds (ref 24–36)

## 2022-11-04 LAB — MAGNESIUM: Magnesium: 1.6 mg/dL — ABNORMAL LOW (ref 1.7–2.4)

## 2022-11-04 LAB — SARS CORONAVIRUS 2 BY RT PCR: SARS Coronavirus 2 by RT PCR: NEGATIVE

## 2022-11-04 LAB — TROPONIN I (HIGH SENSITIVITY)
Troponin I (High Sensitivity): 5 ng/L (ref <18)
Troponin I (High Sensitivity): 5 ng/L (ref <18)

## 2022-11-04 LAB — HEPARIN LEVEL (UNFRACTIONATED): Heparin Unfractionated: 0.54 IU/mL (ref 0.30–0.70)

## 2022-11-04 MED ORDER — PANTOPRAZOLE SODIUM 40 MG PO TBEC: 40.0000 mg | DELAYED_RELEASE_TABLET | Freq: Every day | ORAL | Status: DC | PRN

## 2022-11-04 MED ORDER — METOPROLOL TARTRATE 5 MG/5ML IV SOLN
5.0000 mg | INTRAVENOUS | Status: AC | PRN
  Administered 2022-11-04 (×3): 5 mg via INTRAVENOUS
  Filled 2022-11-04: qty 5

## 2022-11-04 MED ORDER — HEPARIN BOLUS VIA INFUSION
4000.0000 [IU] | Freq: Once | INTRAVENOUS | Status: AC
  Administered 2022-11-04: 4000 [IU] via INTRAVENOUS
  Filled 2022-11-04: qty 4000

## 2022-11-04 MED ORDER — DM-GUAIFENESIN ER 30-600 MG PO TB12: 1.0000 | ORAL_TABLET | Freq: Two times a day (BID) | ORAL | Status: DC | PRN

## 2022-11-04 MED ORDER — IOHEXOL 350 MG/ML SOLN
100.0000 mL | Freq: Once | INTRAVENOUS | Status: AC | PRN
  Administered 2022-11-04: 100 mL via INTRAVENOUS

## 2022-11-04 MED ORDER — FUROSEMIDE 20 MG PO TABS: 20.0000 mg | ORAL_TABLET | Freq: Every day | ORAL | Status: DC

## 2022-11-04 MED ORDER — ALBUTEROL SULFATE (2.5 MG/3ML) 0.083% IN NEBU: 3.0000 mL | INHALATION_SOLUTION | RESPIRATORY_TRACT | Status: DC | PRN

## 2022-11-04 MED ORDER — MONTELUKAST SODIUM 10 MG PO TABS
10.0000 mg | ORAL_TABLET | Freq: Every day | ORAL | Status: DC
  Administered 2022-11-04 – 2022-11-05 (×2): 10 mg via ORAL
  Filled 2022-11-04 (×2): qty 1

## 2022-11-04 MED ORDER — AMIODARONE HCL IN DEXTROSE 360-4.14 MG/200ML-% IV SOLN
60.0000 mg/h | INTRAVENOUS | Status: DC
  Administered 2022-11-04: 60 mg/h via INTRAVENOUS
  Filled 2022-11-04 (×2): qty 200

## 2022-11-04 MED ORDER — ZOLPIDEM TARTRATE 5 MG PO TABS
2.5000 mg | ORAL_TABLET | Freq: Every evening | ORAL | Status: DC | PRN
  Administered 2022-11-04: 2.5 mg via ORAL
  Filled 2022-11-04: qty 1

## 2022-11-04 MED ORDER — DILTIAZEM HCL-DEXTROSE 125-5 MG/125ML-% IV SOLN (PREMIX)
5.0000 mg/h | INTRAVENOUS | Status: DC
  Administered 2022-11-04: 10 mg/h via INTRAVENOUS

## 2022-11-04 MED ORDER — FOLIC ACID 1 MG PO TABS
1.0000 mg | ORAL_TABLET | Freq: Every day | ORAL | Status: DC
  Administered 2022-11-05 – 2022-11-06 (×2): 1 mg via ORAL
  Filled 2022-11-04 (×2): qty 1

## 2022-11-04 MED ORDER — DILTIAZEM HCL-DEXTROSE 125-5 MG/125ML-% IV SOLN (PREMIX)
5.0000 mg/h | INTRAVENOUS | Status: DC
  Administered 2022-11-04: 5 mg/h via INTRAVENOUS
  Filled 2022-11-04: qty 125

## 2022-11-04 MED ORDER — UMECLIDINIUM BROMIDE 62.5 MCG/ACT IN AEPB
1.0000 | INHALATION_SPRAY | Freq: Every day | RESPIRATORY_TRACT | Status: DC
  Administered 2022-11-06: 1 via RESPIRATORY_TRACT
  Filled 2022-11-04: qty 7

## 2022-11-04 MED ORDER — ONDANSETRON HCL 4 MG/2ML IJ SOLN: 4.0000 mg | Freq: Three times a day (TID) | INTRAMUSCULAR | Status: DC | PRN

## 2022-11-04 MED ORDER — ACETAMINOPHEN 325 MG PO TABS: 650.0000 mg | ORAL_TABLET | Freq: Four times a day (QID) | ORAL | Status: DC | PRN

## 2022-11-04 MED ORDER — MAGNESIUM SULFATE 2 GM/50ML IV SOLN
2.0000 g | Freq: Once | INTRAVENOUS | Status: AC
  Administered 2022-11-04: 2 g via INTRAVENOUS
  Filled 2022-11-04: qty 50

## 2022-11-04 MED ORDER — HEPARIN (PORCINE) 25000 UT/250ML-% IV SOLN
1250.0000 [IU]/h | INTRAVENOUS | Status: DC
  Administered 2022-11-04 – 2022-11-05 (×2): 1000 [IU]/h via INTRAVENOUS
  Administered 2022-11-06: 1250 [IU]/h via INTRAVENOUS
  Filled 2022-11-04 (×3): qty 250

## 2022-11-04 MED ORDER — METOPROLOL TARTRATE 25 MG PO TABS
25.0000 mg | ORAL_TABLET | Freq: Four times a day (QID) | ORAL | Status: DC
  Administered 2022-11-04 – 2022-11-05 (×3): 25 mg via ORAL
  Filled 2022-11-04 (×3): qty 1

## 2022-11-04 MED ORDER — HYDRALAZINE HCL 20 MG/ML IJ SOLN: 5.0000 mg | INTRAMUSCULAR | Status: DC | PRN

## 2022-11-04 MED ORDER — VITAMIN D 25 MCG (1000 UNIT) PO TABS
2000.0000 [IU] | ORAL_TABLET | Freq: Every day | ORAL | Status: DC
  Administered 2022-11-05 – 2022-11-06 (×2): 2000 [IU] via ORAL
  Filled 2022-11-04 (×2): qty 2

## 2022-11-04 MED ORDER — LIDOCAINE HCL (PF) 1 % IJ SOLN
5.0000 mL | Freq: Once | INTRAMUSCULAR | Status: AC
  Administered 2022-11-04: 5 mL via INTRADERMAL

## 2022-11-04 MED ORDER — SPIRONOLACTONE 25 MG PO TABS: 25.0000 mg | ORAL_TABLET | Freq: Every day | ORAL | Status: DC

## 2022-11-04 MED ORDER — AMIODARONE LOAD VIA INFUSION
150.0000 mg | Freq: Once | INTRAVENOUS | Status: AC
  Administered 2022-11-04: 150 mg via INTRAVENOUS
  Filled 2022-11-04: qty 83.34

## 2022-11-04 MED ORDER — FLUTICASONE FUROATE-VILANTEROL 100-25 MCG/ACT IN AEPB
1.0000 | INHALATION_SPRAY | Freq: Every day | RESPIRATORY_TRACT | Status: DC
  Administered 2022-11-06: 1 via RESPIRATORY_TRACT
  Filled 2022-11-04: qty 28

## 2022-11-04 MED ORDER — AMIODARONE HCL IN DEXTROSE 360-4.14 MG/200ML-% IV SOLN
30.0000 mg/h | INTRAVENOUS | Status: DC
  Administered 2022-11-05: 30 mg/h via INTRAVENOUS
  Filled 2022-11-04: qty 200

## 2022-11-04 MED ORDER — METOPROLOL TARTRATE 25 MG PO TABS
25.0000 mg | ORAL_TABLET | Freq: Once | ORAL | Status: AC
  Administered 2022-11-04: 25 mg via ORAL
  Filled 2022-11-04: qty 1

## 2022-11-04 MED ORDER — ALPRAZOLAM 0.5 MG PO TABS: 0.5000 mg | ORAL_TABLET | Freq: Every evening | ORAL | Status: DC | PRN

## 2022-11-04 NOTE — Progress Notes (Signed)
Subjective:    Patient ID: Katherine Perry, female    DOB: [birth year], 67 y.o.   MRN: [id number] Patient Care Team: Rusty Aus, MD as PCP - General (Internal Medicine) Telford Nab, RN as Oncology Nurse Navigator Armando Reichert, MD as Consulting Physician (Pulmonary Disease)  Chief Complaint  Patient presents with   Follow-up    Tired and has not energy. Dry cough. Wheezing. SOB with exertion.    HPI Patient is a 67 year old remote former smoker with a history of adenocarcinoma of the right lung and recurrent pleural effusions who presents as a ACUTE visit.  She has most recently been followed by Dr. Armando Reichert.  Previously followed by Dr. Claudette Stapler.  Her oncologist is Dr. Randa Evens.  She has chemoradiation for her adenocarcinoma with resultant atelectasis of the right lower lobe and not expansion of the lung.  This in turn has resulted in recurrent pleural effusion.  She last had a thoracentesis on 28 October 2022.  Previously she was requiring thoracentesis every 4 to 6 weeks.  Now this is becoming more frequent.  She presents today with fatigue, lack of energy, dry cough, and significant dyspnea on exertion.  No sputum production or hemoptysis.  Has had no fevers, chills or sweats.  No chest pain but has had "chest heaviness".    Not using inhalers.   Review of Systems A 10 point review of systems was performed and it is as noted above otherwise negative.  Past Medical History:  Diagnosis Date   Anxiety    Asthma    Cancer    Cough    Dyspnea    with exertion   GERD (gastroesophageal reflux disease)    Hypertension    Lung cancer    Past Surgical History:  Procedure Laterality Date   BUNIONECTOMY Bilateral    COLONOSCOPY     COLONOSCOPY WITH PROPOFOL N/A 07/30/2021   Procedure: COLONOSCOPY WITH PROPOFOL;  Surgeon: Annamaria Helling, DO;  Location: [facility] ENDOSCOPY;  Service: Gastroenterology;  Laterality: N/A;   ESOPHAGOGASTRODUODENOSCOPY  04/01/2006    FLEXIBLE BRONCHOSCOPY Right 10/13/2022   Procedure: FLEXIBLE BRONCHOSCOPY;  Surgeon: Armando Reichert, MD;  Location: [facility] ORS;  Service: Pulmonary;  Laterality: Right;   PORTA CATH INSERTION N/A 12/22/2020   Procedure: PORTA CATH INSERTION;  Surgeon: Algernon Huxley, MD;  Location: [facility] LAB;  Service: Cardiovascular;  Laterality: N/A;   PORTA CATH REMOVAL N/A 05/31/2022   Procedure: PORTA CATH REMOVAL;  Surgeon: Algernon Huxley, MD;  Location: [facility];  Service: Cardiovascular;  Laterality: N/A;   THORACENTESIS     VIDEO BRONCHOSCOPY WITH ENDOBRONCHIAL NAVIGATION N/A 11/07/2020   Procedure: VIDEO BRONCHOSCOPY WITH ENDOBRONCHIAL NAVIGATION;  Surgeon: Ottie Glazier, MD;  Location: [facility] ORS;  Service: Thoracic;  Laterality: N/A;   VIDEO BRONCHOSCOPY WITH ENDOBRONCHIAL ULTRASOUND N/A 11/07/2020   Procedure: VIDEO BRONCHOSCOPY WITH ENDOBRONCHIAL ULTRASOUND;  Surgeon: Ottie Glazier, MD;  Location: [facility] ORS;  Service: Thoracic;  Laterality: N/A;   VIDEO BRONCHOSCOPY WITH ENDOBRONCHIAL ULTRASOUND Right 10/13/2022   Procedure: VIDEO BRONCHOSCOPY WITH ENDOBRONCHIAL ULTRASOUND;  Surgeon: Armando Reichert, MD;  Location: [facility] ORS;  Service: Pulmonary;  Laterality: Right;   Patient Active Problem List   Diagnosis Date Noted   Recurrent pleural effusion on right 07/20/2022   COPD (chronic obstructive pulmonary disease) 07/20/2022   Sinus tachycardia 07/20/2022   Pneumothorax 07/19/2022   Primary lung adenocarcinoma, right 09/17/2021   Chemotherapy induced neutropenia 01/26/2021   Malignant neoplasm of lower lobe  of right lung 11/23/2020   Chronic hyponatremia 01/16/2020   Laryngopharyngeal reflux (LPR) 10/18/2019   Family history of colon cancer 09/11/2018   Tubular adenoma 09/11/2018   B12 deficiency 09/07/2016   Benign essential hypertension 07/05/2016   Mild reactive airways disease 08/29/2014   Lumbar disc disease 12/13/2013   Family History  Problem Relation Age of Onset    Breast cancer Neg Hx    Social History   Tobacco Use   Smoking status: Former    Packs/day: 1.50    Years: 26.80    Additional pack years: 0.00    Total pack years: 40.20    Types: Cigarettes    Quit date: 11/07/1994    Years since quitting: 28.0   Smokeless tobacco: Never  Substance Use Topics   Alcohol use: Yes    Comment:  wine daily   Allergies  Allergen Reactions   Ace Inhibitors Cough   Current Meds  Medication Sig   acetaminophen (TYLENOL) 500 MG tablet Take 500 mg by mouth every 6 (six) hours as needed.   ALPRAZolam (XANAX) 0.5 MG tablet Take 0.5 mg by mouth at bedtime as needed for sleep.   azelastine (ASTELIN) 0.1 % nasal spray Place 2 sprays into both nostrils 2 (two) times daily. Use in each nostril as directed   Cholecalciferol 50 MCG (2000 UT) CAPS Take 2,000 Units by mouth daily.   Cyanocobalamin (B-12 COMPLIANCE INJECTION IJ) Inject 1,000 mcg as directed. Once a month   esomeprazole (NEXIUM) 20 MG capsule Take 20 mg by mouth as needed.   Fluticasone-Umeclidin-Vilant (TRELEGY ELLIPTA) 100-62.5-25 MCG/ACT AEPB Inhale 1 puff into the lungs daily as needed.   folic acid (FOLVITE) 1 MG tablet TAKE 1 TABLET BY MOUTH EVERY DAY   furosemide (LASIX) 20 MG tablet Take 1 tablet by mouth daily.   metoprolol succinate (TOPROL-XL) 50 MG 24 hr tablet Take 50 mg by mouth every morning.   montelukast (SINGULAIR) 10 MG tablet Take 1 tablet by mouth at bedtime.   spironolactone (ALDACTONE) 25 MG tablet Take 25 mg by mouth daily.   torsemide (DEMADEX) 20 MG tablet Take 20 mg by mouth daily.   zolpidem (AMBIEN) 5 MG tablet Take 0.5 tablets by mouth at bedtime as needed.   Immunization History  Administered Date(s) Administered   Influenza,inj,Quad PF,6+ Mos 05/08/2019, 05/13/2021, 05/11/2022   Influenza-Unspecified 05/02/2017   PFIZER Comirnaty(Gray Top)Covid-19 Tri-Sucrose Vaccine 10/19/2019, 11/16/2019, 05/16/2020   PFIZER(Purple Top)SARS-COV-2 Vaccination 10/19/2019,  11/16/2019, 05/16/2020   Pneumococcal Polysaccharide-23 05/19/2021       Objective:   Physical Exam BP 122/80 (BP Location: Left Arm, Cuff Size: Normal)   Pulse (!) 183   Temp (!) 97.1 F (36.2 C)   Ht 5\' 5"  (1.651 m)   Wt 146 lb 6.4 oz (66.4 kg)   SpO2 97%   BMI 24.36 kg/m   SpO2: 97 % O2 Device: None (Room air)  GENERAL: Well-developed, well-nourished woman, no acute distress.  Fully ambulatory.  No conversational dyspnea. HEAD: Normocephalic, atraumatic.  EYES: Pupils equal, round, reactive to light.  No scleral icterus.  MOUTH: Teeth intact, oral mucosa moist.  No thrush. NECK: Supple. No thyromegaly. Trachea midline. No JVD.  No adenopathy. PULMONARY: Good air entry on left, diminished breath sounds on right midlung field to base.  No adventitious sounds. CARDIOVASCULAR: S1 and S2.  Tachycardic, regular rate, no rubs murmurs or gallops heard.  Rate at time of examination 120 but then increases to 180s very quickly. ABDOMEN: Benign. MUSCULOSKELETAL: No joint  deformity, no clubbing, no edema.  NEUROLOGIC: No overt focal deficit, no gait disturbance, speech is fluent. SKIN: Intact,warm,dry. PSYCH: Mood and behavior normal.  POC ultrasound: Large right pleural effusion, cannot exclude pericardial effusion as well.  Ambulatory oximetry was performed: Erratic heart rate between 140-184 bpm, O2 nadir 94%.  EKG was performed: EKG showed heart rate of 186 bpm with PACs, there is decreased voltage in the precordial leads.  EKG will have to be scanned as cannot download.  Results for orders placed or performed during the hospital encounter of 11/02/22 (from the past 48 hour(s))  TSH     Status: None   Collection Time: 11/02/22  1:15 PM  Result Value Ref Range   TSH 0.673 0.350 - 4.500 uIU/mL    Comment: Performed by a 3rd Generation assay with a functional sensitivity of <=0.01 uIU/mL. Performed at [facility], [address] [place], [place] [id number]   Basic  metabolic panel     Status: Abnormal   Collection Time: 11/02/22  1:15 PM  Result Value Ref Range   Sodium 131 (L) 135 - 145 mmol/L   Potassium 3.4 (L) 3.5 - 5.1 mmol/L   Chloride 92 (L) 98 - 111 mmol/L   CO2 27 22 - 32 mmol/L   Glucose, Bld 180 (H) 70 - 99 mg/dL    Comment: Glucose reference range applies only to samples taken after fasting for at least 8 hours.   BUN 8 8 - 23 mg/dL   Creatinine, Ser 0.73 0.44 - 1.00 mg/dL   Calcium 8.9 8.9 - 10.3 mg/dL   GFR, Estimated >60 >60 mL/min    Comment: (NOTE) Calculated using the CKD-EPI Creatinine Equation (2021)    Anion gap 12 5 - 15    Comment: Performed at [facility], [address] [place], [place] [address]  CBC w/Diff     Status: Abnormal   Collection Time: 11/02/22  1:15 PM  Result Value Ref Range   WBC 6.0 4.0 - 10.5 K/uL   RBC 3.60 (L) 3.87 - 5.11 MIL/uL   Hemoglobin 11.7 (L) 12.0 - 15.0 g/dL   HCT 34.7 (L) 36.0 - 46.0 %   MCV 96.4 80.0 - 100.0 fL   MCH 32.5 26.0 - 34.0 pg   MCHC 33.7 30.0 - 36.0 g/dL   RDW 12.7 11.5 - 15.5 %   Platelets 432 (H) 150 - 400 K/uL   nRBC 0.0 0.0 - 0.2 %   Neutrophils Relative % 78 %   Neutro Abs 4.7 1.7 - 7.7 K/uL   Lymphocytes Relative 11 %   Lymphs Abs 0.7 0.7 - 4.0 K/uL   Monocytes Relative 10 %   Monocytes Absolute 0.6 0.1 - 1.0 K/uL   Eosinophils Relative 0 %   Eosinophils Absolute 0.0 0.0 - 0.5 K/uL   Basophils Relative 1 %   Basophils Absolute 0.0 0.0 - 0.1 K/uL   Immature Granulocytes 0 %   Abs Immature Granulocytes 0.02 0.00 - 0.07 K/uL    Comment: Performed at [facility], [address] [place], [place] [address]   Chest x-ray performed yesterday shows large right pleural effusion and increased size of the cardiac silhouette as well (new):     Assessment & Plan:     ICD-10-CM   1. Tachycardia  R00.0 Electrocardiogram report    CANCELED: CT Angio Chest Pulmonary Embolism (PE) W or WO Contrast   Concerning, she is fluctuating between 120 to 186  bpm Given history of  adenocarcinoma concern for PE  Recommend emergent evaluation for potential PE    2. Shortness of breath  R06.02 CANCELED: CT Angio Chest Pulmonary Embolism (PE) W or WO Contrast   Fatigue/shortness of breath Multifactorial Recurrent pleural effusion, cannot exclude pericardial effusion    3. Malignant neoplasm of lower lobe of right lung  C34.31 CANCELED: CT Angio Chest Pulmonary Embolism (PE) W or WO Contrast   Status post chemo XRT Residual collapse of the right lower lobe    4. Recurrent pleural effusion on right  J90    Likely due to persistent atelectasis Will need repeat thoracentesis Consider Pleurx catheter     Orders Placed This Encounter  Procedures   Electrocardiogram report   Patient's tachycardia is concerning.  Currently at 186 bpm.  Need to rule out potential PE.  Will need repeat thoracentesis.  Cannot exclude potential pericardial effusion.  Patient will need to be evaluated in the emergency room to expedite workup of potential life-threatening condition.  Options for the recurrent pleural effusion are limited.  Patient is interested in pursuing thoracic surgery evaluation however her lung appears to be nonexpanding and VATS with pleurodesis may be limited.  However a pleural biopsy may be done at that time.  She may need Pleurx catheter due to recurrence of the effusion.  Patient has been transported to the emergency room for further evaluation and management.  Renold Don, MD Advanced Bronchoscopy PCCM [facility]    *This note was dictated using voice recognition software/Dragon.  Despite best efforts to proofread, errors can occur which can change the meaning. Any transcriptional errors that result from this process are unintentional and may not be fully corrected at the time of dictation.

## 2022-11-04 NOTE — Patient Instructions (Signed)
Patient will require evaluation in the emergency room.

## 2022-11-04 NOTE — ED Notes (Signed)
Dr. Patsey Berthold phone number is [phone] if there are any questions.

## 2022-11-04 NOTE — Progress Notes (Signed)
[facility] for initiation and monitoring of heparin infusion Indication: atrial fibrillation  Allergies  Allergen Reactions   Ace Inhibitors Cough    Patient Measurements: Height: 5\' 5"  (165.1 cm) Weight: 66 kg (145 lb 8.1 oz) IBW/kg (Calculated) : 57 Heparin Dosing Weight: 66 kg  Vital Signs: Temp: 98.4 F (36.9 C) (04/04 0932) Temp Source: Oral (04/04 0932) BP: 131/84 (04/04 1230) Pulse Rate: 132 (04/04 1230)  Labs: Recent Labs    11/02/22 1315 11/04/22 0951 11/04/22 1322  HGB 11.7* 12.5  --   HCT 34.7* 38.0  --   PLT 432* 469*  --   APTT  --   --  30  LABPROT  --   --  13.6  INR  --   --  1.1  CREATININE 0.73 0.63  --   TROPONINIHS  --  5  --     Estimated Creatinine Clearance: 63.1 mL/min (by C-G formula based on SCr of 0.63 mg/dL).   Medical History: Past Medical History:  Diagnosis Date   Anxiety    Asthma    Cancer    Cough    Dyspnea    with exertion   GERD (gastroesophageal reflux disease)    Hypertension    Lung cancer     Assessment: 64 YOF w/ PMH of breast cancer, HTN, recurrent pleural effusions undergoing scheduled monthly thoracentesis with new onset  atrial fibrillation. A review of medical records reveals no chronic anticoagulation prior to arrival   Baseline labs: aPTT 30s, INR 1.1  Goal of Therapy:  Heparin level 0.3-0.7 units/ml Monitor platelets by anticoagulation protocol: Yes   Plan:  Give 4000 units bolus x 1 Start heparin infusion at 1000 units/hr Check anti-Xa level in 6 hours and daily while on heparin Continue to monitor H&H and platelets  Dallie Piles 11/04/2022,1:55 PM

## 2022-11-04 NOTE — ED Provider Notes (Signed)
[facility] Provider Note    Event Date/Time   First MD Initiated Contact with Patient 11/04/22 [phone]     (approximate)   History   No chief complaint on file.   HPI  Katherine Perry is a 67 y.o. female   Past medical history of history of breast cancer no longer on chemotherapy, recurrent pleural effusions undergoing scheduled monthly thoracentesis last 1 performed 2 weeks ago, COPD with chronic cough, hypertension, who presents to emergency department with tachycardia.  She was at her regularly scheduled office visit when she was found to be tachycardic in the 170s and sent to the emergency department.  She was normotensive.  She has no acute medical complaints but does note that her dyspnea on exertion has worsened more quickly than she would expect her usual course of symptoms that tend to get progressively worse leading up to her thoracentesis.  Of note, she has lost her metoprolol that she normally takes for hypertension and has been without this medication for approximately 1 week.  Her home dose is metoprolol succinate XL 50 mg daily.  She has no history of dysrhythmias like atrial fibrillation or supraventricular tachycardia.  She denies drug or alcohol use.  Independent Historian contributed to assessment above: Her husband provides collateral information  External Medical Documents Reviewed: Progress note from Dr. Patsey Berthold of pulmonary disease office visit dated earlier today noted to have lack of energy and dyspnea on exertion, Dr. Patsey Berthold sent to the emergency department to rule out PE, assess for pericardial effusion, also for tachycardia      Physical Exam   Triage Vital Signs: ED Triage Vitals  Enc Vitals Group     BP 11/04/22 0932 (!) 109/94     Pulse Rate 11/04/22 0932 (!) 179     Resp 11/04/22 0932 16     Temp 11/04/22 0932 98.4 F (36.9 C)     Temp Source 11/04/22 0932 Oral     SpO2 11/04/22 0932 96 %     Weight 11/04/22 0939  145 lb 8.1 oz (66 kg)     Height 11/04/22 0939 5\' 5"  (1.651 m)     Head Circumference --      Peak Flow --      Pain Score 11/04/22 0939 0     Pain Loc --      Pain Edu? --      Excl. in [place]? --     Most recent vital signs: Vitals:   11/04/22 0932 11/04/22 1100  BP: (!) 109/94 105/80  Pulse: (!) 179 (!) 105  Resp: 16 (!) 30  Temp: 98.4 F (36.9 C)   SpO2: 96% 95%    General: Awake, no distress.  CV:  Good peripheral perfusion.  Resp:  Normal effort.  Abd:  No distention.  Other:  Awake alert oriented comfortable appearing with tachycardia in the 170s and a normal blood pressure, heart rate will fluctuate from the 150s to 170s irregular but skin appears warm well-perfused mentation is normal lung sounds moving air bilaterally without focality or wheezing abdomen is soft and nontender.   ED Results / Procedures / Treatments   Labs (all labs ordered are listed, but only abnormal results are displayed) Labs Reviewed  BASIC METABOLIC PANEL - Abnormal; Notable for the following components:      Result Value   Chloride 96 (*)    Glucose, Bld 118 (*)    BUN 6 (*)    All other components within  normal limits  CBC - Abnormal; Notable for the following components:   Platelets 469 (*)    All other components within normal limits  SARS CORONAVIRUS 2 BY RT PCR  TROPONIN I (HIGH SENSITIVITY)  TROPONIN I (HIGH SENSITIVITY)     I ordered and reviewed the above labs they are notable for normal cell counts  EKG  ED ECG REPORT I, Lucillie Garfinkel, the attending physician, personally viewed and interpreted this ECG.   Date: 11/04/2022  EKG Time: 0937  Rate: 180  Rhythm: SVT  Axis: nl  Intervals:none  ST&T Change: No acute ischemic changes    RADIOLOGY I independently reviewed and interpreted chest x-ray and see a large right-sided pleural effusion   PROCEDURES:  Critical Care performed: Yes, see critical care procedure note(s)  .Critical Care  Performed by: Lucillie Garfinkel,  MD Authorized by: Lucillie Garfinkel, MD   Critical care provider statement:    Critical care time (minutes):  30   Critical care was time spent personally by me on the following activities:  Development of treatment plan with patient or surrogate, discussions with consultants, evaluation of patient's response to treatment, examination of patient, ordering and review of laboratory studies, ordering and review of radiographic studies, ordering and performing treatments and interventions, pulse oximetry, re-evaluation of patient's condition and review of old charts    [facility]: Medications  metoprolol tartrate (LOPRESSOR) injection 5 mg (5 mg Intravenous Given 11/04/22 1122)  iohexol (OMNIPAQUE) 350 MG/ML injection 100 mL (100 mLs Intravenous Contrast Given 11/04/22 1035)    External physician / consultants:  I spoke with IR regarding care plan for this patient.   IMPRESSION / MDM / ASSESSMENT AND PLAN / ED COURSE  I reviewed the triage vital signs and the nursing notes.                                Patient's presentation is most consistent with acute presentation with potential threat to life or bodily function.  Differential diagnosis includes, but is not limited to, supraventricular tachycardia, AF with RVR, PE, pleural effusion, pericardial effusion, COPD exacerbation, respiratory infection, ACS   The patient is on the cardiac monitor to evaluate for evidence of arrhythmia and/or significant heart rate changes.  MDM:   This is a patient with tachycardia that appears to be in atrial fibrillation repeat EKG showed the same, fluctuating from 160s to 180s with normal blood pressure and no history of atrial fibrillation in the past.  The inciting event may be multifactorial between her pleural effusion, losing her metoprolol for the last 1 week, and assessment for PE or pericardial effusion, will check basic labs, troponin, and a CT angiogram.    Rate control was achieved with  5 mg of IV metoprolol x 2 after which her rate came down to the 120s and was still irregular and then converted to normal sinus rhythm in the low 100s.  CHA2DS2-VASc score for atrial fibrillation stroke risk is 3 which puts her in the moderate high risk category and would be an anticoagulation candidate.  -- Unfortunately after the patient came back from her CT angiogram she went back into atrial fibrillation with RVR with a rate in the 150s continues to be normotensive.  I gave another 5 mg IV dose of metoprolol will chase it with a p.o. dose.  She has on imaging large right-sided pleural effusion as well as slightly increased pericardial effusion  from prior imaging, but fortunately no evidence of PE.  I consulted with IR for thoracentesis and have paged out to hospitalist for admission given atrial fibrillation with RVR as well as pleural effusion requiring thoracentesis and increased pericardial effusion (though BP and this is not a new finding, I will defer emergent pericardiocentesis at this time and I will allow for further evaluation and management as needed while inpatient)  ---        FINAL CLINICAL IMPRESSION(S) / ED DIAGNOSES   Final diagnoses:  Atrial fibrillation with rapid ventricular response  Pleural effusion  Dyspnea on exertion  Pericardial effusion     Rx / DC Orders   ED Discharge Orders     None        Note:  This document was prepared using Dragon voice recognition software and may include unintentional dictation errors.    Lucillie Garfinkel, MD 11/04/22 1124

## 2022-11-04 NOTE — Procedures (Signed)
PROCEDURE SUMMARY:  Successful US guided therapeutic right thoracentesis. Yielded 1.5 L of hazy, yellow fluid. Pt tolerated procedure well. No immediate complications.  Specimen not sent for labs. CXR ordered.  EBL < 1 mL  Tyson Alias, AGNP 11/04/2022 4:01 PM

## 2022-11-04 NOTE — Consult Note (Signed)
Cardiology Consultation   Patient ID: [name] MRN: [id number]; DOB: Dec 12, 1955  Admit date: 11/04/2022 Date of Consult: 11/04/2022  PCP:  Rusty Aus, [facility] Cardiologist:  New  Patient Profile:   Katherine Perry is a 67 y.o. female with a hx of breast cancer no longer on chemotherapy, recurrent pleural effusions undergoing monthly thoracentesis last 1 performed 2 weeks ago, HTN who is being seen 11/04/2022 for the evaluation of atrial fibrillation at the request of Dr. Blaine Hamper.  History of Present Illness:   Katherine Perry has not been seen by cardiology in the past. Echo from 05/2022 showed normal LVSF, RVSF, no pericardial effusion. Remote tobacco use. She drinks a glass of wine every night. No h/o of drug use.  The patient presented [facility] ED for shortness of breath and palpitations. She was at her pulmonology visit and noted to be tachycardic to the 170s and sent to the ER. Patient had not been taking metoprolol for the last week. She denies chest pain, SOB, palpitations. Reported lower leg edema for the last week. She reports she was taking Torsemide and spiro daily.  In the ER BP 109/94, pulse 179bpm, RR 16, 96% O2. Labs showed chloride 96, K 3.6, BG 118, plt 469. EKG showed SVT with rate of 180bpm. CXR showed large right pleural effusion. HR were fluctuating between 160-180s. She was given IV 5mg  metoprolol x 2 and HR came down to 120s in afib and subsequently converted to SR.   The patient came back from CT was and was back in Afib RVR with a rate in the 150s, she was normotensive. She was given another dose of IV metoprolol 5mg . CT chest showed right sided pleural effusion and increased pericardial effusion. The patient was started on IV dilt and admitted for further work-up.   Past Medical History:  Diagnosis Date   Anxiety    Asthma    Cancer    Cough    Dyspnea    with exertion   GERD (gastroesophageal reflux disease)    Hypertension    Lung  cancer     Past Surgical History:  Procedure Laterality Date   BUNIONECTOMY Bilateral    COLONOSCOPY     COLONOSCOPY WITH PROPOFOL N/A 07/30/2021   Procedure: COLONOSCOPY WITH PROPOFOL;  Surgeon: Annamaria Helling, DO;  Location: [facility] ENDOSCOPY;  Service: Gastroenterology;  Laterality: N/A;   ESOPHAGOGASTRODUODENOSCOPY  04/01/2006   FLEXIBLE BRONCHOSCOPY Right 10/13/2022   Procedure: FLEXIBLE BRONCHOSCOPY;  Surgeon: Armando Reichert, MD;  Location: [facility] ORS;  Service: Pulmonary;  Laterality: Right;   PORTA CATH INSERTION N/A 12/22/2020   Procedure: PORTA CATH INSERTION;  Surgeon: Algernon Huxley, MD;  Location: [place] CV LAB;  Service: Cardiovascular;  Laterality: N/A;   PORTA CATH REMOVAL N/A 05/31/2022   Procedure: PORTA CATH REMOVAL;  Surgeon: Algernon Huxley, MD;  Location: [place] CV LAB;  Service: Cardiovascular;  Laterality: N/A;   THORACENTESIS     VIDEO BRONCHOSCOPY WITH ENDOBRONCHIAL NAVIGATION N/A 11/07/2020   Procedure: VIDEO BRONCHOSCOPY WITH ENDOBRONCHIAL NAVIGATION;  Surgeon: Ottie Glazier, MD;  Location: [facility] ORS;  Service: Thoracic;  Laterality: N/A;   VIDEO BRONCHOSCOPY WITH ENDOBRONCHIAL ULTRASOUND N/A 11/07/2020   Procedure: VIDEO BRONCHOSCOPY WITH ENDOBRONCHIAL ULTRASOUND;  Surgeon: Ottie Glazier, MD;  Location: [facility] ORS;  Service: Thoracic;  Laterality: N/A;   VIDEO BRONCHOSCOPY WITH ENDOBRONCHIAL ULTRASOUND Right 10/13/2022   Procedure: VIDEO BRONCHOSCOPY WITH ENDOBRONCHIAL ULTRASOUND;  Surgeon: Armando Reichert, MD;  Location: [facility] ORS;  Service: Pulmonary;  Laterality: Right;     Home Medications:  Prior to Admission medications   Medication Sig Start Date End Date Taking? Authorizing Provider  acetaminophen (TYLENOL) 500 MG tablet Take 500 mg by mouth every 6 (six) hours as needed.    [provider]  ALPRAZolam Duanne Moron) 0.5 MG tablet Take 0.5 mg by mouth at bedtime as needed for sleep. 10/18/20   [provider]  azelastine (ASTELIN)  0.1 % nasal spray Place 2 sprays into both nostrils 2 (two) times daily. Use in each nostril as directed 05/08/21   Verlon Au, NP  Cholecalciferol 50 MCG (2000 UT) CAPS Take 2,000 Units by mouth daily.    [provider]  Cyanocobalamin (B-12 COMPLIANCE INJECTION IJ) Inject 1,000 mcg as directed. Once a month    [provider]  esomeprazole (NEXIUM) 20 MG capsule Take 20 mg by mouth as needed.    [provider]  fluticasone (FLONASE) 50 MCG/ACT nasal spray Place 2 sprays into both nostrils daily as needed for rhinitis. Patient not taking: Reported on 11/04/2022 09/22/20   [provider]  Fluticasone-Umeclidin-Vilant (TRELEGY ELLIPTA) 100-62.5-25 MCG/ACT AEPB Inhale 1 puff into the lungs daily as needed.    [provider]  folic acid (FOLVITE) 1 MG tablet TAKE 1 TABLET BY MOUTH EVERY DAY 05/18/22   Sindy Guadeloupe, MD  furosemide (LASIX) 20 MG tablet Take 1 tablet by mouth daily. 07/21/22   [provider]  metoprolol succinate (TOPROL-XL) 50 MG 24 hr tablet Take 50 mg by mouth every morning. 10/16/20   [provider]  montelukast (SINGULAIR) 10 MG tablet Take 1 tablet by mouth at bedtime. 11/03/21 11/04/22  [provider]  spironolactone (ALDACTONE) 25 MG tablet Take 25 mg by mouth daily. 08/14/22   [provider]  torsemide (DEMADEX) 20 MG tablet Take 20 mg by mouth daily. 05/19/22 05/19/23  [provider]  zolpidem (AMBIEN) 5 MG tablet Take 0.5 tablets by mouth at bedtime as needed. 05/31/22 11/27/22  [provider]    Inpatient Medications: Scheduled Meds:  Continuous Infusions:  PRN Meds: acetaminophen, albuterol, dextromethorphan-guaiFENesin, hydrALAZINE, ondansetron (ZOFRAN) IV  Allergies:    Allergies  Allergen Reactions   Ace Inhibitors Cough    Social History:   Social History   Socioeconomic History   Marital status: Married    Spouse name: Not on file   Number of  children: Not on file   Years of education: Not on file   Highest education level: Not on file  Occupational History   Not on file  Tobacco Use   Smoking status: Former    Packs/day: 1.50    Years: 26.80    Additional pack years: 0.00    Total pack years: 40.20    Types: Cigarettes    Quit date: 11/07/1994    Years since quitting: 28.0   Smokeless tobacco: Never  Vaping Use   Vaping Use: Never used  Substance and Sexual Activity   Alcohol use: Yes    Comment:  wine daily   Drug use: Never   Sexual activity: Yes  Other Topics Concern   Not on file  Social History Narrative   Lives with husband, Thayer Jew, no pets.   Social Determinants of Health   Financial Resource Strain: Not on file  Food Insecurity: Not on file  Transportation Needs: Not on file  Physical Activity: Not on file  Stress: Not on file  Social Connections:  Not on file  Intimate Partner Violence: Not on file    Family History:    Family History  Problem Relation Age of Onset   Breast cancer Neg Hx      ROS:  Please see the history of present illness.   All other ROS reviewed and negative.     Physical Exam/Data:   Vitals:   11/04/22 0932 11/04/22 0939 11/04/22 1100 11/04/22 1138  BP: (!) 109/94  105/80   Pulse: (!) 179  (!) 105 (!) 169  Resp: 16  (!) 30   Temp: 98.4 F (36.9 C)     TempSrc: Oral     SpO2: 96%  95%   Weight:  66 kg    Height:  5\' 5"  (1.651 m)     No intake or output data in the 24 hours ending 11/04/22 1212    11/04/2022    9:39 AM 11/04/2022    8:22 AM 10/13/2022    7:38 AM  Last 3 Weights  Weight (lbs) 145 lb 8.1 oz 146 lb 6.4 oz 142 lb  Weight (kg) 66 kg 66.407 kg 64.411 kg     Body mass index is 24.21 kg/m.  General:  Well nourished, well developed, in no acute distress HEENT: normal Neck: no JVD Vascular: No carotid bruits; Distal pulses 2+ bilaterally Cardiac:  normal S1, S2; Irreg Irreg; no murmur  Lungs:  no lung sounds on the right, crackles on the left   Abd: soft, nontender, no hepatomegaly  Ext: no edema Musculoskeletal:  No deformities, BUE and BLE strength normal and equal Skin: warm and dry  Neuro:  CNs 2-12 intact, no focal abnormalities noted Psych:  Normal affect   EKG:  The EKG was personally reviewed and demonstrates:  Afib 171bpm, RAD, likely rate related ST/T wave changes Telemetry:  Telemetry was personally reviewed and demonstrates:  Afib HR 150-180s Relevant CV Studies:  Echo ordered  Echo 05/2022 INTERPRETATION  NORMAL LEFT VENTRICULAR SYSTOLIC FUNCTION  NORMAL RIGHT VENTRICULAR SYSTOLIC FUNCTION  MILD VALVULAR REGURGITATION (See above)  NO VALVULAR STENOSIS  No pericardial effusion seen   Laboratory Data:  High Sensitivity Troponin:   Recent Labs  Lab 11/04/22 0951  TROPONINIHS 5     Chemistry Recent Labs  Lab 11/02/22 1315 11/04/22 0951  NA 131* 135  K 3.4* 3.6  CL 92* 96*  CO2 27 26  GLUCOSE 180* 118*  BUN 8 6*  CREATININE 0.73 0.63  CALCIUM 8.9 9.1  GFRNONAA >60 >60  ANIONGAP 12 13    No results for input(s): "PROT", "ALBUMIN", "AST", "ALT", "ALKPHOS", "BILITOT" in the last 168 hours. Lipids No results for input(s): "CHOL", "TRIG", "HDL", "LABVLDL", "LDLCALC", "CHOLHDL" in the last 168 hours.  Hematology Recent Labs  Lab 11/02/22 1315 11/04/22 0951  WBC 6.0 6.8  RBC 3.60* 3.87  HGB 11.7* 12.5  HCT 34.7* 38.0  MCV 96.4 98.2  MCH 32.5 32.3  MCHC 33.7 32.9  RDW 12.7 12.8  PLT 432* 469*   Thyroid  Recent Labs  Lab 11/02/22 1315  TSH 0.673    BNPNo results for input(s): "BNP", "PROBNP" in the last 168 hours.  DDimer No results for input(s): "DDIMER" in the last 168 hours.   Radiology/Studies:  CT Angio Chest PE W/Cm &/Or Wo Cm  Result Date: 11/04/2022 CLINICAL DATA:  Weakness, possible pericardial effusion versus pulmonary embolism. Tachycardia. History of lung cancer with recurrent pleural effusion. EXAM: CT ANGIOGRAPHY CHEST WITH CONTRAST TECHNIQUE: Multidetector CT imaging  of the chest  was performed using the standard protocol during bolus administration of intravenous contrast. Multiplanar CT image reconstructions and MIPs were obtained to evaluate the vascular anatomy. RADIATION DOSE REDUCTION: This exam was performed according to the departmental dose-optimization program which includes automated exposure control, adjustment of the mA and/or kV according to patient size and/or use of iterative reconstruction technique. CONTRAST:  175mL OMNIPAQUE IOHEXOL 350 MG/ML SOLN COMPARISON:  Same day chest radiograph, CTA chest 10/08/2022 FINDINGS: Cardiovascular: There is adequate opacification of the pulmonary arteries to the segmental level. There is no evidence of pulmonary embolism. The heart size is normal. There is a moderate pericardial effusion measuring up to 1.8 cm in thickness. The effusion measures simple fluid attenuation. The effusion is increased since 10/08/2022. The thoracic aorta is normal in caliber with scattered calcified plaque. Mediastinum/Nodes: The thyroid is unremarkable. The esophagus is grossly unremarkable. There is no mediastinal, hilar, or axillary lymphadenopathy. Lungs/Pleura: The trachea is patent. The right lower lobe bronchus is occluded and there is narrowing of the right middle lobe bronchus. Findings are similar to the prior CT. There is a large pleural effusion with subtotal collapse of the right lung. The effusion is similar in size to the study from 10/08/2022. There is a small left pleural effusion, minimally increased since the prior study. The left lung is otherwise clear. There is no pneumothorax. Upper Abdomen: The imaged portions of the upper abdominal viscera are unremarkable. Musculoskeletal: There is no acute osseous abnormality or suspicious osseous lesion. Review of the MIP images confirms the above findings. IMPRESSION: 1. No evidence of pulmonary embolism. 2. Moderate-sized pericardial effusion is increased since the CT from 10/08/2022.  3. Large right pleural effusion with subtotal collapse of the right lung is similar to the prior study. A smaller left effusion is increased in size. Electronically Signed   By: Valetta Mole M.D.   On: 11/04/2022 11:00   DG Chest Port 1 View  Result Date: 11/04/2022 CLINICAL DATA:  Tachycardia EXAM: PORTABLE CHEST 1 VIEW COMPARISON:  11/02/2022 FINDINGS: Large right-sided pleural effusion. Small left-sided pleural effusion. Normal pulmonary vasculature. Cardiac silhouette obscured. No pneumothorax identified. Calcified aorta. IMPRESSION: Pleural effusions, large on the right and small on the left. Electronically Signed   By: Sammie Bench M.D.   On: 11/04/2022 10:29   DG Chest 2 View  Result Date: 11/03/2022 CLINICAL DATA:  Shortness of breath, fatigue EXAM: CHEST - 2 VIEW COMPARISON:  Previous studies including the examination of 10/28/2022 FINDINGS: Transverse diameter of heart is increased. There is a significant interval increase in amount of right pleural effusion extending to the upper right hilum and in the apical region. There is interval appearance of a small left pleural effusion. There are no signs of alveolar pulmonary edema in the visualized lung fields. Evaluation of right mid and right lower lung fields for infiltrates is limited by the large effusion. There is no pneumothorax. IMPRESSION: Cardiomegaly.  There are no signs of pulmonary edema. Large right pleural effusion with interval increase in size. There is interval appearance of small left pleural effusion. Electronically Signed   By: Elmer Picker M.D.   On: 11/03/2022 20:53     Assessment and Plan:   New onset Atrial fib - patient presented from the pulmonology clinic with tachycardia with rates in the 180s. She lost her metoprolol 1 week ago.  - Chest CT showed no PE, moderate-sized pericardial effusion, large right pleural effusion - she has h/o lung cancer with recurrent pleural effusions needing recurrent  thoracentesis - Patient was given IV metoprolol with conversion to NSR, but went back into afib - started on IV dilt - PTA metoprolol 50mg  daily - she remains in Afib RVR with rates in the 150s - patient denies significant palpitations. She does note lower leg edema x 1 week - no significant caffeine use. No OSA history.  - Keep K>4 and Mag>2. Recent TSH wnl - CHADSVASC at least 50 (age, female, HTN). She is a candidate for long-term anticoagulation - check an echo - continue with rate control for now. Plan on thoracentesis this admission, which will likely help rates.   Lung cancer s/p chemo and XRT Recurrent pleural effusion - patient follows closely with pulmonology - she undergoes repeat thoracentesis, may need Pleurx catheter - she takes Torsemide 20mg  daily and spiro 25mg  daily - IR was consulted for thoracentesis  Pericardial effusion - check echo as above   For questions or updates, please contact [place] Please consult [email or web address] for contact info under    Signed, Siana Panameno Ninfa Meeker, PA-C  11/04/2022 12:12 PM

## 2022-11-04 NOTE — Progress Notes (Signed)
[place] for initiation and monitoring of heparin infusion Indication: atrial fibrillation  Allergies  Allergen Reactions   Ace Inhibitors Cough    Patient Measurements: Height: 5\' 5"  (165.1 cm) Weight: 66 kg (145 lb 8.1 oz) IBW/kg (Calculated) : 57 Heparin Dosing Weight: 66 kg  Vital Signs: Temp: 98.2 F (36.8 C) (04/04 1849) Temp Source: Oral (04/04 1849) BP: 99/72 (04/04 2347) Pulse Rate: 123 (04/04 2347)  Labs: Recent Labs    11/02/22 1315 11/04/22 0951 11/04/22 1322 11/04/22 2208  HGB 11.7* 12.5  --   --   HCT 34.7* 38.0  --   --   PLT 432* 469*  --   --   APTT  --   --  30  --   LABPROT  --   --  13.6  --   INR  --   --  1.1  --   HEPARINUNFRC  --   --   --  0.54  CREATININE 0.73 0.63  --   --   TROPONINIHS  --  5 5  --      Estimated Creatinine Clearance: 63.1 mL/min (by C-G formula based on SCr of 0.63 mg/dL).   Medical History: Past Medical History:  Diagnosis Date   Anxiety    Asthma    Cancer    Cough    Dyspnea    with exertion   GERD (gastroesophageal reflux disease)    Hypertension    Lung cancer     Assessment: 49 YOF w/ PMH of breast cancer, HTN, recurrent pleural effusions undergoing scheduled monthly thoracentesis with new onset  atrial fibrillation. A review of medical records reveals no chronic anticoagulation prior to arrival   Baseline labs: aPTT 30s, INR 1.1  Goal of Therapy:  Heparin level 0.3-0.7 units/ml Monitor platelets by anticoagulation protocol: Yes   4/05 2208 HL 0.54, therapeutic x 1  Plan:  Continue heparin infusion at 1000 units/hr Recheck HL w/ AM labs to confirm CBC daily while on heparin.  Renda Rolls, PharmD, [facility] 11/04/2022 11:55 PM

## 2022-11-04 NOTE — TOC Benefit Eligibility Note (Signed)
Patient Teacher, English as a foreign language completed.    The patient is currently admitted and upon discharge could be taking Eliquis 5 mg.  The current 30 day co-pay is $47.00.   The patient is insured through [employer] Part D   This test claim was processed through [place] amounts may vary at other pharmacies due to pharmacy/plan contracts, or as the patient moves through the different stages of their insurance plan.  Lyndel Safe, [place] Patient Advocate Specialist [facility] Patient Advocate Team Direct Number: [phone]  Fax: [phone]

## 2022-11-04 NOTE — H&P (Signed)
History and Physical    Katherine Perry [id number] DOB: 1956-02-13 DOA: 11/04/2022  Referring MD/NP/PA:   PCP: Rusty Aus, MD   Patient coming from:  The patient is coming from home.    Chief Complaint: SOB, tachycardia  HPI: Katherine Perry is a 67 y.o. female with medical history significant of lung cancer (s/p of chemo and RXT), recurrent pleural effusions undergoing scheduled monthly thoracentesis (last 1 performed 2 weeks ago), HTN, asthma (patient denies history of COPD), anxiety, who presents with shortness of breath, tachycardia  Pt states that she has been exertional shortness of breath and heart racing. Pt was seen by Dr. Patsey Berthold of pulmonology clinic, and was found to have tachycardia with heart rate up to 180s, sent to ED for further evaluation and treatment.  Patient does not have chest pain.  Patient has mild dry cough, no fever or chills.  Denies nausea, vomiting, diarrhea or abdominal pain.  No symptoms of UTI.  Patient states that she had thoracentesis approximately 2 weeks ago.  Patient states that she is taking metoprolol succinate XL 50 mg daily for HTN, but she has lost her metoprolol, without this medication for approximately 1 week.    Pt was found to have new onset atrial fibrillation with RVR, heart rate up to 180s, which is converted to sinus rhythm temporarily after giving metoprolol in ED.  Current heart rate 100s-130s  Data reviewed independently and ED Course: pt was found to have WBC 6.8, TSH 0.673, negative COVID PCR, GFR> 60, temperature 98.4, blood pressure 105/80, RR 30, oxygen saturation 95% on room air.  Chest x-ray showed bilateral pleural effusion (large right pleural effusion and small left pleural effusion).  Patient is admitted to PCU as inpatient.  Dr. Fletcher Anon of cardiology is consulted.  CTA: 1. No evidence of pulmonary embolism. 2. Moderate-sized pericardial effusion is increased since the CT from 10/08/2022. 3. Large right pleural effusion with  subtotal collapse of the right lung is similar to the prior study. A smaller left effusion is increased in size.    EKG: I have personally reviewed.  First EKG showed SVT with heart rate 180, RAD, low voltage.  The repeated EKG showed atrial fibrillation, heart rate of 170 with low voltage.   Review of Systems:   General: no fevers, chills, no body weight gain, has fatigue HEENT: no blurry vision, hearing changes or sore throat Respiratory: has dyspnea, coughing, no wheezing CV: no chest pain, no palpitations GI: no nausea, vomiting, abdominal pain, diarrhea, constipation GU: no dysuria, burning on urination, increased urinary frequency, hematuria  Ext: has trace leg edema Neuro: no unilateral weakness, numbness, or tingling, no vision change or hearing loss Skin: no rash, no skin tear. MSK: No muscle spasm, no deformity, no limitation of range of movement in spin Heme: No easy bruising.  Travel history: No recent long distant travel.   Allergy:  Allergies  Allergen Reactions   Ace Inhibitors Cough    Past Medical History:  Diagnosis Date   Anxiety    Asthma    Cancer    Cough    Dyspnea    with exertion   GERD (gastroesophageal reflux disease)    Hypertension    Lung cancer     Past Surgical History:  Procedure Laterality Date   BUNIONECTOMY Bilateral    COLONOSCOPY     COLONOSCOPY WITH PROPOFOL N/A 07/30/2021   Procedure: COLONOSCOPY WITH PROPOFOL;  Surgeon: Annamaria Helling, DO;  Location: [facility];  Service:  Gastroenterology;  Laterality: N/A;   ESOPHAGOGASTRODUODENOSCOPY  04/01/2006   FLEXIBLE BRONCHOSCOPY Right 10/13/2022   Procedure: FLEXIBLE BRONCHOSCOPY;  Surgeon: Armando Reichert, MD;  Location: [facility] OR[facility];  Service: Pulmonary;  Laterality: Right;   PORTA CATH INSERTION N/A 12/22/2020   Procedure: PORTA CATH INSERTION;  Surgeon: Algernon Huxley, MD;  Location: [place] [facility];  Service: Cardiovascular;  Laterality: N/A;   PORTA CATH REMOVAL N/A  05/31/2022   Procedure: PORTA CATH REMOVAL;  Surgeon: Algernon Huxley, MD;  Location: [place] [facility];  Service: Cardiovascular;  Laterality: N/A;   THORACENTESIS     VIDEO BRONCHOSCOPY WITH ENDOBRONCHIAL NAVIGATION N/A 11/07/2020   Procedure: VIDEO BRONCHOSCOPY WITH ENDOBRONCHIAL NAVIGATION;  Surgeon: Ottie Glazier, MD;  Location: [facility] OR[facility];  Service: Thoracic;  Laterality: N/A;   VIDEO BRONCHOSCOPY WITH ENDOBRONCHIAL ULTRASOUND N/A 11/07/2020   Procedure: VIDEO BRONCHOSCOPY WITH ENDOBRONCHIAL ULTRASOUND;  Surgeon: Ottie Glazier, MD;  Location: [facility];  Service: Thoracic;  Laterality: N/A;   VIDEO BRONCHOSCOPY WITH ENDOBRONCHIAL ULTRASOUND Right 10/13/2022   Procedure: VIDEO BRONCHOSCOPY WITH ENDOBRONCHIAL ULTRASOUND;  Surgeon: Armando Reichert, MD;  Location: [facility];  Service: Pulmonary;  Laterality: Right;    Social History:  reports that she quit smoking about 28 years ago. Her smoking use included cigarettes. She has a 40.20 pack-year smoking history. She has never used smokeless tobacco. She reports current alcohol use. She reports that she does not use drugs.  Family History:  Family History  Problem Relation Age of Onset   Breast cancer Neg Hx      Prior to Admission medications   Medication Sig Start Date End Date Taking? Authorizing Provider  acetaminophen (TYLENOL) 500 MG tablet Take 500 mg by mouth every 6 (six) hours as needed.    [provider]  ALPRAZolam Duanne Moron) 0.5 MG tablet Take 0.5 mg by mouth at bedtime as needed for sleep. 10/18/20   [provider]  azelastine (ASTELIN) 0.1 % nasal spray Place 2 sprays into both nostrils 2 (two) times daily. Use in each nostril as directed 05/08/21   Verlon Au, NP  Cholecalciferol 50 MCG (2000 UT) CAPS Take 2,000 Units by mouth daily.    [provider]  Cyanocobalamin (B-12 COMPLIANCE INJECTION IJ) Inject 1,000 mcg as directed. Once a month    [provider]  esomeprazole (NEXIUM) 20 MG  capsule Take 20 mg by mouth as needed.    [provider]  fluticasone (FLONASE) 50 MCG/ACT nasal spray Place 2 sprays into both nostrils daily as needed for rhinitis. Patient not taking: Reported on 11/04/2022 09/22/20   [provider]  Fluticasone-Umeclidin-Vilant (TRELEGY ELLIPTA) 100-62.5-25 MCG/ACT AEPB Inhale 1 puff into the lungs daily as needed.    [provider]  folic acid (FOLVITE) 1 MG tablet TAKE 1 TABLET BY MOUTH EVERY DAY 05/18/22   Sindy Guadeloupe, MD  furosemide (LASIX) 20 MG tablet Take 1 tablet by mouth daily. 07/21/22   [provider]  metoprolol succinate (TOPROL-XL) 50 MG 24 hr tablet Take 50 mg by mouth every morning. 10/16/20   [provider]  montelukast (SINGULAIR) 10 MG tablet Take 1 tablet by mouth at bedtime. 11/03/21 11/04/22  [provider]  spironolactone (ALDACTONE) 25 MG tablet Take 25 mg by mouth daily. 08/14/22   [provider]  torsemide (DEMADEX) 20 MG tablet Take 20 mg by mouth daily. 05/19/22 05/19/23  [provider]  zolpidem (AMBIEN) 5 MG tablet Take 0.5 tablets by mouth at bedtime as  needed. 05/31/22 11/27/22  [provider]    Physical Exam: Vitals:   11/04/22 1548 11/04/22 1555 11/04/22 1730 11/04/22 1800  BP:  112/81 (!) 87/46 118/67  Pulse: 93 (!) 116 (!) 101 74  Resp:  20 (!) 25 (!) 23  Temp:      TempSrc:      SpO2:  97% 99% 100%  Weight:      Height:       General: Not in acute distress HEENT:       Eyes: PERRL, EOMI, no scleral icterus.       ENT: No discharge from the ears and nose, no pharynx injection, no tonsillar enlargement.        Neck: No JVD, no bruit, no mass felt. Heme: No neck lymph node enlargement. Cardiac: S1/S2, irregularly irregular rhythm, No murmurs, No gallops or rubs. Respiratory: Decreased air movement on the right side GI: Soft, nondistended, nontender, no rebound pain, no organomegaly, BS present. GU: No hematuria Ext: has trace  leg edema bilaterally. 1+DP/PT pulse bilaterally. Musculoskeletal: No joint deformities, No joint redness or warmth, no limitation of ROM in spin. Skin: No rashes.  Neuro: Alert, oriented X3, cranial nerves II-XII grossly intact, moves all extremities normally.  Psych: Patient is not psychotic, no suicidal or hemocidal ideation.  Labs on Admission: I have personally reviewed following labs and imaging studies  CBC: Recent Labs  Lab 11/02/22 1315 11/04/22 0951  WBC 6.0 6.8  NEUTROABS 4.7  --   HGB 11.7* 12.5  HCT 34.7* 38.0  MCV 96.4 98.2  PLT 432* [id number]*   Basic Metabolic Panel: Recent Labs  Lab 11/02/22 1315 11/04/22 0951  NA 131* 135  K 3.4* 3.6  CL 92* 96*  CO2 27 26  GLUCOSE 180* 118*  BUN 8 6*  CREATININE 0.73 0.63  CALCIUM 8.9 9.1  MG  --  1.6*   GFR: Estimated Creatinine Clearance: 63.1 mL/min (by C-G formula based on SCr of 0.63 mg/dL). Liver Function Tests: No results for input(s): "AST", "ALT", "ALKPHOS", "BILITOT", "PROT", "ALBUMIN" in the last 168 hours. No results for input(s): "LIPASE", "AMYLASE" in the last 168 hours. No results for input(s): "AMMONIA" in the last 168 hours. Coagulation Profile: Recent Labs  Lab 11/04/22 1322  INR 1.1   Cardiac Enzymes: No results for input(s): "CKTOTAL", "CKMB", "CKMBINDEX", "TROPONINI" in the last 168 hours. BNP (last 3 results) No results for input(s): "PROBNP" in the last 8760 hours. HbA1C: No results for input(s): "HGBA1C" in the last 72 hours. CBG: No results for input(s): "GLUCAP" in the last 168 hours. Lipid Profile: No results for input(s): "CHOL", "HDL", "LDLCALC", "TRIG", "CHOLHDL", "LDLDIRECT" in the last 72 hours. Thyroid Function Tests: Recent Labs    11/02/22 1315  TSH 0.673   Anemia Panel: No results for input(s): "VITAMINB12", "FOLATE", "FERRITIN", "TIBC", "IRON", "RETICCTPCT" in the last 72 hours. Urine analysis: No results found for: "COLORURINE", "APPEARANCEUR", "LABSPEC", "PHURINE",  "GLUCOSEU", "HGBUR", "BILIRUBINUR", "KETONESUR", "PROTEINUR", "UROBILINOGEN", "NITRITE", "LEUKOCYTESUR" Sepsis Labs: @LABRCNTIP (procalcitonin:4,lacticidven:4) ) Recent Results (from the past 240 hour(s))  SARS Coronavirus 2 by RT PCR (hospital order, performed in [facility] hospital lab) *cepheid single result test* Anterior Nasal Swab     Status: None   Collection Time: 11/04/22 10:02 AM   Specimen: Anterior Nasal Swab  Result Value Ref Range Status   SARS Coronavirus 2 by RT PCR NEGATIVE NEGATIVE Final    Comment: (NOTE) SARS-CoV-2 target nucleic acids are NOT DETECTED.  The SARS-CoV-2 RNA is generally detectable in  upper and lower respiratory specimens during the acute phase of infection. The lowest concentration of SARS-CoV-2 viral copies this assay can detect is 250 copies / mL. A negative result does not preclude SARS-CoV-2 infection and should not be used as the sole basis for treatment or other patient management decisions.  A negative result may occur with improper specimen collection / handling, submission of specimen other than nasopharyngeal swab, presence of viral mutation(s) within the areas targeted by this assay, and inadequate number of viral copies (<250 copies / mL). A negative result must be combined with clinical observations, patient history, and epidemiological information.  Fact Sheet for Patients:   https://www.patel.info/  Fact Sheet for Healthcare Providers: https://hall.com/  This test is not yet approved or  cleared by the [place] FDA and has been authorized for detection and/or diagnosis of SARS-CoV-2 by FDA under an Emergency Use Authorization (EUA).  This EUA will remain in effect (meaning this test can be used) for the duration of the COVID-19 declaration under Section 564(b)(1) of the Act, 21 U.S.C. section 360bbb-3(b)(1), unless the authorization is terminated or revoked sooner.  Performed at  [facility], [address], [place], [place] [address]      Radiological Exams on Admission: DG Chest [facility] 1 View  Result Date: 11/04/2022 CLINICAL DATA:  Status post right thoracentesis. EXAM: PORTABLE CHEST 1 VIEW COMPARISON:  11/04/2022. FINDINGS: 1623 hours. Decreased, now moderate right pleural effusion status post thoracentesis. No pneumothorax. Left lung is clear. Stable cardiac and mediastinal contours. Trace left pleural effusion. IMPRESSION: Decreased, now moderate right pleural effusion status post thoracentesis. No pneumothorax. Electronically Signed   By: Emmit Alexanders M.D.   On: 11/04/2022 16:40   US THORACENTESIS ASP PLEURAL SPACE W/IMG GUIDE  Result Date: 11/04/2022 INDICATION: History of right-sided lung cancer now in remission with persistent recurrent symptomatic right pleural effusion. Request received for therapeutic right thoracentesis. EXAM: ULTRASOUND GUIDED THERAPEUTIC RIGHT THORACENTESIS MEDICATIONS: 10 mL 1 % lidocaine COMPLICATIONS: None immediate. PROCEDURE: An ultrasound guided thoracentesis was thoroughly discussed with the patient and questions answered. The benefits, risks, alternatives and complications were also discussed. The patient understands and wishes to proceed with the procedure. Written consent was obtained. Ultrasound was performed to localize and mark an adequate pocket of fluid in the right chest. The area was then prepped and draped in the normal sterile fashion. 1% Lidocaine was used for local anesthesia. Under ultrasound guidance a 6 Fr Safe-T-Centesis catheter was introduced. Thoracentesis was performed. The catheter was removed and a dressing applied. FINDINGS: A total of approximately 1.5 L of hazy, yellow fluid was removed. IMPRESSION: Successful ultrasound guided right thoracentesis yielding 1.5 L of pleural fluid. Read by: Narda Rutherford, AGNP-BC Electronically Signed   By: Albin Felling M.D.   On: 11/04/2022 16:06   CT Angio Chest PE  W/Cm &/Or Wo Cm  Result Date: 11/04/2022 CLINICAL DATA:  Weakness, possible pericardial effusion versus pulmonary embolism. Tachycardia. History of lung cancer with recurrent pleural effusion. EXAM: CT ANGIOGRAPHY CHEST WITH CONTRAST TECHNIQUE: Multidetector CT imaging of the chest was performed using the standard protocol during bolus administration of intravenous contrast. Multiplanar CT image reconstructions and MIPs were obtained to evaluate the vascular anatomy. RADIATION DOSE REDUCTION: This exam was performed according to the departmental dose-optimization program which includes automated exposure control, adjustment of the mA and/or kV according to patient size and/or use of iterative reconstruction technique. CONTRAST:  172mL OMNIPAQUE IOHEXOL 350 MG/ML SOLN COMPARISON:  Same day chest radiograph, CTA chest  10/08/2022 FINDINGS: Cardiovascular: There is adequate opacification of the pulmonary arteries to the segmental level. There is no evidence of pulmonary embolism. The heart size is normal. There is a moderate pericardial effusion measuring up to 1.8 cm in thickness. The effusion measures simple fluid attenuation. The effusion is increased since 10/08/2022. The thoracic aorta is normal in caliber with scattered calcified plaque. Mediastinum/Nodes: The thyroid is unremarkable. The esophagus is grossly unremarkable. There is no mediastinal, hilar, or axillary lymphadenopathy. Lungs/Pleura: The trachea is patent. The right lower lobe bronchus is occluded and there is narrowing of the right middle lobe bronchus. Findings are similar to the prior CT. There is a large pleural effusion with subtotal collapse of the right lung. The effusion is similar in size to the study from 10/08/2022. There is a small left pleural effusion, minimally increased since the prior study. The left lung is otherwise clear. There is no pneumothorax. Upper Abdomen: The imaged portions of the upper abdominal viscera are unremarkable.  Musculoskeletal: There is no acute osseous abnormality or suspicious osseous lesion. Review of the MIP images confirms the above findings. IMPRESSION: 1. No evidence of pulmonary embolism. 2. Moderate-sized pericardial effusion is increased since the CT from 10/08/2022. 3. Large right pleural effusion with subtotal collapse of the right lung is similar to the prior study. A smaller left effusion is increased in size. Electronically Signed   By: Valetta Mole M.D.   On: 11/04/2022 11:00   DG Chest Port 1 View  Result Date: 11/04/2022 CLINICAL DATA:  Tachycardia EXAM: PORTABLE CHEST 1 VIEW COMPARISON:  11/02/2022 FINDINGS: Large right-sided pleural effusion. Small left-sided pleural effusion. Normal pulmonary vasculature. Cardiac silhouette obscured. No pneumothorax identified. Calcified aorta. IMPRESSION: Pleural effusions, large on the right and small on the left. Electronically Signed   By: Sammie Bench M.D.   On: 11/04/2022 10:29      Assessment/Plan Principal Problem:   Atrial fibrillation with RVR Active Problems:   Recurrent pleural effusion on right   Asthma   Pericardial effusion   Primary lung adenocarcinoma, right   HTN (hypertension)   Anxiety   Assessment and Plan:  Atrial fibrillation with RVR: Heart rate up to 183 initially, which improved to 100- 130 after started Cardizem drip and giving metoprolol.  CHADS2 score is 3.  Patient will need anticoagulants.  TSH 0.673.  Consulted [place] of card.  -Admitted to PCU as inpatient -IV heparin started per cards -2D echo -Switched Cardizem drip to amiodarone per card -Metoprolol tartrate 25 mg every 6 hours  Recurrent pleural effusion on right: This is likely due to history of lung cancer.  No fever or leukocytosis, will not order fluid lab analysis. -Ordered thoracentesis   Asthma -Bronchodilators  Pericardial effusion: CTA showed moderate pericardial effusion, no signs of tamponade. -Follow-up 2D echo  Primary lung  adenocarcinoma, right: S/p of chemo and radiation therapy.  No surgery. -Follow-up with oncology  HTN: -pt is taking Lasix and spironolactone -Metoprolol -IV hydralazine as needed  Anxiety -Continue home as needed Xanax      DVT ppx: SCD  Code Status: Full code per pt  Family Communication:  Yes, patient's husband   at bed side.    Disposition Plan:  Anticipate discharge back to previous environment  Consults called:  Dr. Fletcher Anon of card  Admission status and Level of care: Progressive:    inpt      Dispo: The patient is from: Home  Anticipated d/c is to: Home              Anticipated d/c date is: 2 days              Patient currently is not medically stable to d/c.    Severity of Illness:  The appropriate patient status for this patient is INPATIENT. Inpatient status is judged to be reasonable and necessary in order to provide the required intensity of service to ensure the patient's safety. The patient's presenting symptoms, physical exam findings, and initial radiographic and laboratory data in the context of their chronic comorbidities is felt to place them at high risk for further clinical deterioration. Furthermore, it is not anticipated that the patient will be medically stable for discharge from the hospital within 2 midnights of admission.   * I certify that at the point of admission it is my clinical judgment that the patient will require inpatient hospital care spanning beyond 2 midnights from the point of admission due to high intensity of service, high risk for further deterioration and high frequency of surveillance required.*       Date of Service 11/04/2022    Ivor [facility]   If 7PM-7AM, please contact night-coverage [email or web address] 11/04/2022, 6:10 PM

## 2022-11-04 NOTE — ED Notes (Signed)
Hospitalist messaged due to pt HR being higher than order parameters. No new orders at this time.

## 2022-11-04 NOTE — ED Notes (Signed)
Pt transported to ultrasound.

## 2022-11-04 NOTE — ED Triage Notes (Signed)
Arrives from [place] clinic.  C/O weakness.  Dr. Patsey Berthold brings patient over and is concerned for possible pericardial effusion vs. PE.  Dr. Patsey Berthold reports that patient is tachycardic 110's and HR elevates with exertion.

## 2022-11-05 ENCOUNTER — Inpatient Hospital Stay (HOSPITAL_COMMUNITY)
Admit: 2022-11-05 | Discharge: 2022-11-05 | Disposition: A | Discharge status: Home / Self Care | Payer: Medicare Other | Attending: Medical | Admitting: Medical

## 2022-11-05 DIAGNOSIS — I4892 Unspecified atrial flutter: Secondary | ICD-10-CM | POA: Diagnosis not present

## 2022-11-05 DIAGNOSIS — I4891 Unspecified atrial fibrillation: Secondary | ICD-10-CM

## 2022-11-05 DIAGNOSIS — J90 Pleural effusion, not elsewhere classified: Secondary | ICD-10-CM

## 2022-11-05 LAB — BASIC METABOLIC PANEL
Anion gap: 9 (ref 5–15)
BUN: 7 mg/dL — ABNORMAL LOW (ref 8–23)
CO2: 24 mmol/L (ref 22–32)
Calcium: 8.3 mg/dL — ABNORMAL LOW (ref 8.9–10.3)
Chloride: 97 mmol/L — ABNORMAL LOW (ref 98–111)
Creatinine, Ser: 0.62 mg/dL (ref 0.44–1.00)
GFR, Estimated: >60 mL/min (ref >60)
Glucose, Bld: 117 mg/dL — ABNORMAL HIGH (ref 70–99)
Potassium: 5.9 mmol/L — ABNORMAL HIGH (ref 3.5–5.1)
Sodium: 130 mmol/L — ABNORMAL LOW (ref 135–145)

## 2022-11-05 LAB — HEPARIN LEVEL (UNFRACTIONATED)
Heparin Unfractionated: 0.34 IU/mL (ref 0.30–0.70)
Heparin Unfractionated: 0.18 IU/mL — ABNORMAL LOW (ref 0.30–0.70)

## 2022-11-05 LAB — POTASSIUM
Potassium: 3 mmol/L — ABNORMAL LOW (ref 3.5–5.1)
Potassium: 4.6 mmol/L (ref 3.5–5.1)

## 2022-11-05 LAB — MAGNESIUM: Magnesium: 1.5 mg/dL — ABNORMAL LOW (ref 1.7–2.4)

## 2022-11-05 LAB — CBC
HCT: 35 % — ABNORMAL LOW (ref 36.0–46.0)
Hemoglobin: 11.8 g/dL — ABNORMAL LOW (ref 12.0–15.0)
MCH: 33 pg (ref 26.0–34.0)
MCHC: 33.7 g/dL (ref 30.0–36.0)
MCV: 97.8 fL (ref 80.0–100.0)
Platelets: 468 10*3/uL — ABNORMAL HIGH (ref 150–400)
RBC: 3.58 MIL/uL — ABNORMAL LOW (ref 3.87–5.11)
RDW: 12.7 % (ref 11.5–15.5)
WBC: 5.3 10*3/uL (ref 4.0–10.5)
nRBC: 0 % (ref 0.0–0.2)

## 2022-11-05 LAB — ECHOCARDIOGRAM COMPLETE
AR max vel: 2.29 cm2
AV Area VTI: 2.59 cm2
AV Area mean vel: 2.37 cm2
AV Mean grad: 5 mmHg
AV Peak grad: 9.9 mmHg
Ao pk vel: 1.57 m/s
Area-P 1/2: 5.2 cm2
Height: 65 in
MV VTI: 2.76 cm2
S' Lateral: 2.9 cm
Weight: 2296 oz

## 2022-11-05 MED ORDER — AMIODARONE HCL IN DEXTROSE 360-4.14 MG/200ML-% IV SOLN
60.0000 mg/h | INTRAVENOUS | Status: AC
  Administered 2022-11-05: 60 mg/h via INTRAVENOUS
  Filled 2022-11-05: qty 200

## 2022-11-05 MED ORDER — HEPARIN BOLUS VIA INFUSION
2000.0000 [IU] | Freq: Once | INTRAVENOUS | Status: AC
  Administered 2022-11-05: 2000 [IU] via INTRAVENOUS
  Filled 2022-11-05: qty 2000

## 2022-11-05 MED ORDER — METOPROLOL TARTRATE 25 MG PO TABS
25.0000 mg | ORAL_TABLET | Freq: Two times a day (BID) | ORAL | Status: DC
  Administered 2022-11-05 – 2022-11-06 (×2): 25 mg via ORAL
  Filled 2022-11-05 (×2): qty 1

## 2022-11-05 MED ORDER — POTASSIUM CHLORIDE 10 MEQ/100ML IV SOLN
10.0000 meq | INTRAVENOUS | Status: AC
  Administered 2022-11-05 (×4): 10 meq via INTRAVENOUS
  Filled 2022-11-05 (×4): qty 100

## 2022-11-05 MED ORDER — MAGNESIUM SULFATE 2 GM/50ML IV SOLN
2.0000 g | Freq: Once | INTRAVENOUS | Status: AC
  Administered 2022-11-05: 2 g via INTRAVENOUS
  Filled 2022-11-05: qty 50

## 2022-11-05 MED ORDER — CALCIUM GLUCONATE-NACL 1-0.675 GM/50ML-% IV SOLN
1.0000 g | Freq: Once | INTRAVENOUS | Status: AC
  Administered 2022-11-05: 1000 mg via INTRAVENOUS
  Filled 2022-11-05: qty 50

## 2022-11-05 MED ORDER — AMIODARONE HCL IN DEXTROSE 360-4.14 MG/200ML-% IV SOLN
30.0000 mg/h | INTRAVENOUS | Status: DC
  Administered 2022-11-05: 30 mg/h via INTRAVENOUS
  Filled 2022-11-05 (×2): qty 200

## 2022-11-05 MED ORDER — FUROSEMIDE 20 MG PO TABS
20.0000 mg | ORAL_TABLET | Freq: Every day | ORAL | Status: DC
  Administered 2022-11-06: 20 mg via ORAL
  Filled 2022-11-05: qty 1

## 2022-11-05 MED ORDER — ORAL CARE MOUTH RINSE: 15.0000 mL | OROMUCOSAL | Status: DC | PRN

## 2022-11-05 MED ORDER — FUROSEMIDE 10 MG/ML IJ SOLN
20.0000 mg | Freq: Once | INTRAMUSCULAR | Status: AC
  Administered 2022-11-05: 20 mg via INTRAVENOUS
  Filled 2022-11-05: qty 2

## 2022-11-05 MED ORDER — PNEUMOCOCCAL 20-VAL CONJ VACC 0.5 ML IM SUSY
0.5000 mL | PREFILLED_SYRINGE | INTRAMUSCULAR | Status: DC
  Filled 2022-11-05: qty 0.5

## 2022-11-05 MED ORDER — POTASSIUM CHLORIDE CRYS ER 20 MEQ PO TBCR
40.0000 meq | EXTENDED_RELEASE_TABLET | Freq: Once | ORAL | Status: AC
  Administered 2022-11-05: 40 meq via ORAL
  Filled 2022-11-05: qty 2

## 2022-11-05 MED ORDER — SPIRONOLACTONE 25 MG PO TABS
25.0000 mg | ORAL_TABLET | Freq: Every day | ORAL | Status: DC
  Administered 2022-11-06: 25 mg via ORAL
  Filled 2022-11-05: qty 1

## 2022-11-05 NOTE — TOC Initial Note (Signed)
Transition of Care [facility]) - Initial/Assessment Note    Patient Details  Name: Katherine Perry MRN: [id number] Date of Birth: 09/16/1955  [facility] of [facility]) CM/SW Contact:    Truddie Hidden, RN Phone Number: 11/05/2022, 3:13 PM  Clinical Narrative:                  Transition of Care [facility]) Screening Note   Patient Details  Name: Katherine Perry Date of Birth: 1956/06/03   Transition of Care [facility]) CM/SW Contact:    Truddie Hidden, RN Phone Number: 11/05/2022, 3:13 PM    Transition of Care Department [facility]) has reviewed patient and no TOC needs have been identified at this time. We will continue to monitor patient advancement through interdisciplinary progression rounds. If new patient transition needs arise, please place a TOC consult.          Patient Goals and CMS Choice            Expected Discharge Plan and Services                                              Prior Living Arrangements/Services                       Activities of Daily Living Home Assistive Devices/Equipment: None ADL Screening (condition at time of admission) Patient's cognitive ability adequate to safely complete daily activities?: Yes Is the patient deaf or have difficulty hearing?: No Does the patient have difficulty seeing, even when wearing glasses/contacts?: No Does the patient have difficulty concentrating, remembering, or making decisions?: No Patient able to express need for assistance with ADLs?: Yes Does the patient have difficulty dressing or bathing?: No Independently performs ADLs?: Yes (appropriate for developmental age) Does the patient have difficulty walking or climbing stairs?: No Weakness of Legs: None Weakness of Arms/Hands: None  Permission Sought/Granted                  Emotional Assessment              Admission diagnosis:  Pericardial effusion [I31.39] Dyspnea on exertion [R06.09] Pleural effusion [J90] Atrial fibrillation  with rapid ventricular response [I48.91] Atrial fibrillation with RVR [I48.91] Patient Active Problem List   Diagnosis Date Noted   Pleural effusion 11/05/2022   Paroxysmal atrial flutter w/ RVR 11/04/2022   Pericardial effusion 11/04/2022   Anxiety 11/04/2022   HTN (hypertension) 11/04/2022   Recurrent pleural effusion on right 07/20/2022   Asthma 07/20/2022   Sinus tachycardia 07/20/2022   Pneumothorax 07/19/2022   Primary lung adenocarcinoma, right 09/17/2021   Chemotherapy induced neutropenia 01/26/2021   Malignant neoplasm of lower lobe of right lung 11/23/2020   Chronic hyponatremia 01/16/2020   Laryngopharyngeal reflux (LPR) 10/18/2019   Family history of colon cancer 09/11/2018   Tubular adenoma 09/11/2018   B12 deficiency 09/07/2016   Benign essential hypertension 07/05/2016   Mild reactive airways disease 08/29/2014   Lumbar disc disease 12/13/2013   PCP:  Danella Penton, MD Pharmacy:   [facility] [id number] [facility] - [address] [place] [address] Phone: [phone] Fax: [phone]     Social Determinants of Health (SDOH) Social History: SDOH Screenings   Food Insecurity: No Food Insecurity (11/05/2022)  Housing: Low Risk  (11/05/2022)  Transportation Needs: No Transportation Needs (11/05/2022)  Utilities: Not At Risk (11/05/2022)  Tobacco Use: Medium Risk (11/04/2022)   SDOH Interventions:     Readmission Risk Interventions     No data to display

## 2022-11-05 NOTE — Progress Notes (Addendum)
Physician Attestation Patient seen and examined, agree with detailed note by PA/NP unless otherwise stated in my note.   Patient presentation and plan discussed on rounds.    EKG lab work, chest x-ray,  and/or echocardiogram were reviewed independently by myself  Patient profile/history: 67 year old female with history of right lung cancer s/p chemo and radiation therapy, recurrent right pleural effusion requiring thoracenteses presenting with palpitations, found to have atrial flutter with rapid ventricular response.  Patient adequately managed with amiodarone drip, maintaining sinus rhythm.  States feeling much better compared to admission.  Telemetry shows sinus rhythm, heart rate 65  Physical exam GEN:  Well nourished, well developed in no acute distress HEENT: Normal NECK: No JVD; No carotid bruits CARDIAC: RRR, no murmurs, rubs, gallops RESPIRATORY: Diminished breath sounds at bases bilaterally ABDOMEN: Soft, non-tender, non-distended MUSCULOSKELETAL:  No edema; No deformity  SKIN: Warm and dry NEUROLOGIC:  Alert and oriented x 3 PSYCHIATRIC:  Normal affect   A/P: 1.  Atrial flutter with rapid ventricular response -Maintaining sinus rhythm on amiodarone drip -Continue amiodarone drip through today. -Start p.o. amiodarone tomorrow -Switch heparin to Eliquis upon discharge -Echocardiogram pending.  2.  Lung cancer, recurrent pleural effusion -Management as per medicine team  Greater than 50% was spent in counseling and coordination of care with patient Total encounter time 50 minutes or more   Signed: Debbe Odea, M.D. [facility]       Rounding Note    Patient Name: Katherine Perry Date of Encounter: 11/05/2022  [facility] Cardiologist: Lorine Bears, MD   Subjective   She has been going in and out of afib/aflutter. IV diltiazem was stopped and amiodarone drip started with metoprolol 25mg . HR has come down overnight and remaining in the  90s. She underwent an US guided therapeutic right thoracentesis on 11/04/2022 with IR that yielded 1.5L of fluid. Patient states this made her feel better. This morning, she denies palpitations, shortness of breath and chest pain.   Inpatient Medications    Scheduled Meds:  cholecalciferol  2,000 Units Oral Daily   fluticasone furoate-vilanterol  1 puff Inhalation Daily   And   umeclidinium bromide  1 puff Inhalation Daily   folic acid  1 mg Oral Daily   [START ON 11/06/2022] furosemide  20 mg Oral Daily   metoprolol tartrate  25 mg Oral BID   montelukast  10 mg Oral QHS   [START ON 11/06/2022] pneumococcal 20-valent conjugate vaccine  0.5 mL Intramuscular Tomorrow-1000   Continuous Infusions:  calcium gluconate     heparin 1,000 Units/hr (11/05/22 0656)   PRN Meds: acetaminophen, albuterol, ALPRAZolam, dextromethorphan-guaiFENesin, hydrALAZINE, ondansetron (ZOFRAN) IV, pantoprazole, zolpidem   Vital Signs    Vitals:   11/05/22 0500 11/05/22 0826 11/05/22 0833 11/05/22 1040  BP: (!) 120/57  128/72 115/62  Pulse:   93 94  Resp:   13 18  Temp:   98 F (36.7 C) 97.8 F (36.6 C)  TempSrc:   Oral Oral  SpO2:   98% 99%  Weight:  65.1 kg    Height:        Intake/Output Summary (Last 24 hours) at 11/05/2022 1114 Last data filed at 11/05/2022 0656 Gross per 24 hour  Intake 1364.92 ml  Output --  Net 1364.92 ml      11/05/2022    8:26 AM 11/04/2022    9:39 AM 11/04/2022    8:22 AM  Last 3 Weights  Weight (lbs) 143 lb 8 oz 145 lb 8.1 oz  146 lb 6.4 oz  Weight (kg) 65.091 kg 66 kg 66.407 kg      Telemetry    11/05/2022: NSR - Personally Reviewed  ECG    4/4/204: Afib - Personally Reviewed  Physical Exam   GEN: No acute distress.   Neck: No JVD Cardiac: Irregularly irregular and tachycardic, no murmurs, rubs, or gallops.  Respiratory:  improved lung sounds on the right, crackles on the left  GI: Soft, nontender, non-distended  MS: No edema; No deformity. Neuro:  Nonfocal   Psych: Normal affect   Labs    High Sensitivity Troponin:   Recent Labs  Lab 11/04/22 0951 11/04/22 1322  TROPONINIHS 5 5     Chemistry Recent Labs  Lab 11/02/22 1315 11/04/22 0951 11/04/22 2342 11/05/22 0539 11/05/22 0841  NA 131* 135  --  130*  --   K 3.4* 3.6 3.0* 5.9* 4.6  CL 92* 96*  --  97*  --   CO2 27 26  --  24  --   GLUCOSE 180* 118*  --  117*  --   BUN 8 6*  --  7*  --   CREATININE 0.73 0.63  --  0.62  --   CALCIUM 8.9 9.1  --  8.3*  --   MG  --  1.6* 1.5*  --   --   GFRNONAA >60 >60  --  >60  --   ANIONGAP 12 13  --  9  --     Lipids No results for input(s): "CHOL", "TRIG", "HDL", "LABVLDL", "LDLCALC", "CHOLHDL" in the last 168 hours.  Hematology Recent Labs  Lab 11/02/22 1315 11/04/22 0951 11/05/22 0539  WBC 6.0 6.8 5.3  RBC 3.60* 3.87 3.58*  HGB 11.7* 12.5 11.8*  HCT 34.7* 38.0 35.0*  MCV 96.4 98.2 97.8  MCH 32.5 32.3 33.0  MCHC 33.7 32.9 33.7  RDW 12.7 12.8 12.7  PLT 432* 469* 468*   Thyroid  Recent Labs  Lab 11/02/22 1315  TSH 0.673    BNPNo results for input(s): "BNP", "PROBNP" in the last 168 hours.  DDimer No results for input(s): "DDIMER" in the last 168 hours.   Radiology    DG Chest Port 1 View  Result Date: 11/04/2022 CLINICAL DATA:  Status post right thoracentesis. EXAM: PORTABLE CHEST 1 VIEW COMPARISON:  11/04/2022. FINDINGS: 1623 hours. Decreased, now moderate right pleural effusion status post thoracentesis. No pneumothorax. Left lung is clear. Stable cardiac and mediastinal contours. Trace left pleural effusion. IMPRESSION: Decreased, now moderate right pleural effusion status post thoracentesis. No pneumothorax. Electronically Signed   By: Orvan Falconer M.D.   On: 11/04/2022 16:40   US THORACENTESIS ASP PLEURAL SPACE W/IMG GUIDE  Result Date: 11/04/2022 INDICATION: History of right-sided lung cancer now in remission with persistent recurrent symptomatic right pleural effusion. Request received for therapeutic right  thoracentesis. EXAM: ULTRASOUND GUIDED THERAPEUTIC RIGHT THORACENTESIS MEDICATIONS: 10 mL 1 % lidocaine COMPLICATIONS: None immediate. PROCEDURE: An ultrasound guided thoracentesis was thoroughly discussed with the patient and questions answered. The benefits, risks, alternatives and complications were also discussed. The patient understands and wishes to proceed with the procedure. Written consent was obtained. Ultrasound was performed to localize and mark an adequate pocket of fluid in the right chest. The area was then prepped and draped in the normal sterile fashion. 1% Lidocaine was used for local anesthesia. Under ultrasound guidance a 6 Fr Safe-T-Centesis catheter was introduced. Thoracentesis was performed. The catheter was removed and a dressing applied. FINDINGS: A total of  approximately 1.5 L of hazy, yellow fluid was removed. IMPRESSION: Successful ultrasound guided right thoracentesis yielding 1.5 L of pleural fluid. Read by: Alex GardenerSusan Clapp, AGNP-BC Electronically Signed   By: Olive BassYasser  El-Abd M.D.   On: 11/04/2022 16:06   CT Angio Chest PE W/Cm &/Or Wo Cm  Result Date: 11/04/2022 CLINICAL DATA:  Weakness, possible pericardial effusion versus pulmonary embolism. Tachycardia. History of lung cancer with recurrent pleural effusion. EXAM: CT ANGIOGRAPHY CHEST WITH CONTRAST TECHNIQUE: Multidetector CT imaging of the chest was performed using the standard protocol during bolus administration of intravenous contrast. Multiplanar CT image reconstructions and MIPs were obtained to evaluate the vascular anatomy. RADIATION DOSE REDUCTION: This exam was performed according to the departmental dose-optimization program which includes automated exposure control, adjustment of the mA and/or kV according to patient size and/or use of iterative reconstruction technique. CONTRAST:  100mL OMNIPAQUE IOHEXOL 350 MG/ML SOLN COMPARISON:  Same day chest radiograph, CTA chest 10/08/2022 FINDINGS: Cardiovascular: There is  adequate opacification of the pulmonary arteries to the segmental level. There is no evidence of pulmonary embolism. The heart size is normal. There is a moderate pericardial effusion measuring up to 1.8 cm in thickness. The effusion measures simple fluid attenuation. The effusion is increased since 10/08/2022. The thoracic aorta is normal in caliber with scattered calcified plaque. Mediastinum/Nodes: The thyroid is unremarkable. The esophagus is grossly unremarkable. There is no mediastinal, hilar, or axillary lymphadenopathy. Lungs/Pleura: The trachea is patent. The right lower lobe bronchus is occluded and there is narrowing of the right middle lobe bronchus. Findings are similar to the prior CT. There is a large pleural effusion with subtotal collapse of the right lung. The effusion is similar in size to the study from 10/08/2022. There is a small left pleural effusion, minimally increased since the prior study. The left lung is otherwise clear. There is no pneumothorax. Upper Abdomen: The imaged portions of the upper abdominal viscera are unremarkable. Musculoskeletal: There is no acute osseous abnormality or suspicious osseous lesion. Review of the MIP images confirms the above findings. IMPRESSION: 1. No evidence of pulmonary embolism. 2. Moderate-sized pericardial effusion is increased since the CT from 10/08/2022. 3. Large right pleural effusion with subtotal collapse of the right lung is similar to the prior study. A smaller left effusion is increased in size. Electronically Signed   By: Lesia HausenPeter  Noone M.D.   On: 11/04/2022 11:00   DG Chest Port 1 View  Result Date: 11/04/2022 CLINICAL DATA:  Tachycardia EXAM: PORTABLE CHEST 1 VIEW COMPARISON:  11/02/2022 FINDINGS: Large right-sided pleural effusion. Small left-sided pleural effusion. Normal pulmonary vasculature. Cardiac silhouette obscured. No pneumothorax identified. Calcified aorta. IMPRESSION: Pleural effusions, large on the right and small on the  left. Electronically Signed   By: Layla MawJoshua  Pleasure M.D.   On: 11/04/2022 10:29    Cardiac Studies   Echo 05/2022 INTERPRETATION  NORMAL LEFT VENTRICULAR SYSTOLIC FUNCTION  NORMAL RIGHT VENTRICULAR SYSTOLIC FUNCTION  MILD VALVULAR REGURGITATION (See above)  NO VALVULAR STENOSIS  No pericardial effusion seen   Echo 11/04/2022 - pending results    Patient Profile     Katherine Perry is a 67 y.o. female with a hx of breast cancer no longer on chemotherapy, recurrent pleural effusions undergoing monthly thoracentesis last 1 performed 2 weeks ago, HTN who is being seen 11/05/2022 for the follow up of atrial fibrillation.  Assessment & Plan    Atrial Fibrillation - Still continues to go in and out of Afib/Aflutter with NSR   - HR  has come down overnight  - Discontinued IV diltiazem  - Started on amiodarone drip, PO metoprolol  x 6hrs. Continue rate control for now. Will transition to oral tomorrow - Continue heparin drip, will transition to oral anticoag to Eliquis at discharge  - CHA2DS2-VASc score of at least 3. She should be started on long-term anticoagulation, but need to discuss further given monthly thoracentesis bleeding risk.  - Echo results are pending  - Keep K > 4 and Mag > 2  Lung cancer s/p chemo and XRT; Recurrent pleural effusion - Patient follows closely with pulmonology - She undergoes repeat thoracentesis, may need Pleurx catheter - She takes Torsemide  daily and spiro  daily - IR performed therapeutic US guided right thoracentesis on 11/04/2022; yielded 1.5L of fluid - pending labs   Pericardial Effusion - Check echo as above  For questions or updates, please contact [facility] Please consult [email or web address] for contact info under        Signed, Cadence David Stall, PA-C  11/05/2022, 11:14 AM

## 2022-11-05 NOTE — Progress Notes (Signed)
   11/05/22 0900  Spiritual Encounters  Type of Visit Initial  Care provided to: Pt and family  Referral source Nurse (RN/NT/LPN)  Reason for visit Routine spiritual support  OnCall Visit Yes   Chaplain responded to nurse consult. Chaplain provided compassionate presence and reflective listening as patient and husband spoke of health challenges since being admitted yesterday. Patient shared she has a lot of support from family and has no current needs. Chaplain services are available for follow up as needed.

## 2022-11-05 NOTE — Progress Notes (Signed)
PROGRESS NOTE    Katherine Homansamela C Hinch   BJY:[id number] DOB: 11/24/1955  DOA: 11/04/2022 Date of Service: 11/05/22 PCP: Danella PentonMiller, Mark F, MD     Brief Narrative / Hospital Course:   Katherine Perry is a 67 y.o. female with medical history significant of lung cancer (s/p of chemo and RXT), recurrent pleural effusions undergoing scheduled monthly thoracentesis (last 1 performed 2 weeks ago), HTN, asthma (patient denies history of COPD), anxiety, who presents with shortness of breath, tachycardia described as exertional shortness of breath and heart racing. Pt was seen by Dr. Jayme CloudGonzalez of pulmonology clinic, and was found to have tachycardia with heart rate up to 180s, sent to ED. Patient states that she had thoracentesis approximately 2 weeks ago.  Patient states that she is taking metoprolol succinate XL 50 mg daily for HTN, but she has lost her metoprolol, without this medication for approximately 1 week.   04/04: new onset atrial fibrillation with RVR, heart rate up to 180s, which is converted to sinus rhythm temporarily after giving metoprolol in ED, strated on diltiazem gtt and was in/out of SR/Aflutter. WBC 6.8, TSH 0.673, negative COVID PCR, GFR> 60, temperature 98.4, blood pressure 105/80, RR 30, oxygen saturation 95% on room air.  Chest x-ray showed bilateral pleural effusion (large right pleural effusion and small left pleural effusion).  First EKG showed SVT with heart rate 180, RAD, low voltage. The repeated EKG showed atrial fibrillation, heart rate of 170 with low voltage. On CTA Chest, no PE, moderate pericardial effusion increased since CT 10/08/22, large R pleural effusion w/ subtotal collapse R lung, smaller L effusion increased from prior. Cardiology saw patient - switched to amiodarone gtt, stared po metoprolol 25 mg q6h, continue heparin gtt and will require long term AC, obtain echo to eval pericardial effusion  04/05: potassium elevated. Holding spiro, giving lasix IV, rechecking STAT.    Consultants:  Cardiology - [facility]  Procedures: 11/04/22: US guided therapeutic right thoracentesis yielded 1.5 L hazy yellow fluid       ASSESSMENT & PLAN:   Principal Problem:   Atrial fibrillation with RVR Active Problems:   Recurrent pleural effusion on right   Asthma   Pericardial effusion   Primary lung adenocarcinoma, right   HTN (hypertension)   Anxiety  Atrial flutter with RVR:  Amiodarone gtt per cardiology Po metoprolol 25 mg po q6h per cardiology  Continue heparin gtt  Will need anticoagulation Echocardiogram  Pericardial effusion Echocardiogram    Recurrent pleural effusion on right:  likely due to history of lung cancer.   Thoracentesis done  No fever or leukocytosis, will not order fluid lab analysis.   Hyperkalemia Possible over-supplementation for hypokalemia Holding po spironolactone Lasix IV x1 and resume po tomorrow pending recheck labs  Giving calcium since hypocalcemic as well  Repeat STAT potassium now and if still high will address further   Asthma Bronchodilators   Primary lung adenocarcinoma, right: S/p of chemo and radiation therapy.  No surgery. Follow-up with oncology   HTN: pt is taking Lasix and spironolactone Metoprolol IV hydralazine as needed   Anxiety Continue home as needed Xanax       DVT prophylaxis: heparin gtt, will eventually plan switch to anticoagulation  Pertinent IV fluids/nutrition: no continuous IV fluids  Central lines / invasive devices: none  Code Status: FULL CODE   Current Admission Status: inpatient   TOC needs / Dispo plan: TBD Barriers to discharge / significant pending items: cardiac stabilization, echo to eval pericardial effusion, still  on amio and heparin gtt, expect will be here 2-3 more days              Subjective / Brief ROS:  Patient reports feeling better today but not back to normal Denies CP/SOB.  Pain controlled.  Denies new weakness.  Tolerating diet.  Reports  no concerns w/ urination/defecation.   Family Communication: husband at bedside on rounds     Objective Findings:  Vitals:   11/05/22 0251 11/05/22 0252 11/05/22 0400 11/05/22 0500  BP:   130/70 (!) 120/57  Pulse:      Resp: (!) 27 (!) 25 (!) 23   Temp:   98.3 F (36.8 C)   TempSrc:   Oral   SpO2:   95%   Weight:      Height:        Intake/Output Summary (Last 24 hours) at 11/05/2022 0815 Last data filed at 11/05/2022 0656 Gross per 24 hour  Intake 1364.92 ml  Output --  Net 1364.92 ml   Filed Weights   11/04/22 0939  Weight: 66 kg    Examination:  Physical Exam Constitutional:      Appearance: Normal appearance.  Cardiovascular:     Rate and Rhythm: Normal rate and regular rhythm.     Heart sounds: Normal heart sounds.  Pulmonary:     Effort: Pulmonary effort is normal.     Breath sounds: Examination of the left-middle field reveals decreased breath sounds. Examination of the left-lower field reveals decreased breath sounds. Decreased breath sounds present.  Abdominal:     General: Abdomen is flat.     Palpations: Abdomen is soft.  Skin:    General: Skin is warm and dry.  Neurological:     General: No focal deficit present.     Mental Status: She is alert and oriented to person, place, and time. Mental status is at baseline.  Psychiatric:        Mood and Affect: Mood normal.        Behavior: Behavior normal.          Scheduled Medications:   cholecalciferol  2,000 Units Oral Daily   fluticasone furoate-vilanterol  1 puff Inhalation Daily   And   umeclidinium bromide  1 puff Inhalation Daily   folic acid  1 mg Oral Daily   furosemide  20 mg Intravenous Once   [START ON 11/06/2022] furosemide  20 mg Oral Daily   metoprolol tartrate  25 mg Oral Q6H   montelukast  10 mg Oral QHS   [START ON 11/06/2022] pneumococcal 20-valent conjugate vaccine  0.5 mL Intramuscular Tomorrow-1000    Continuous Infusions:  amiodarone 30 mg/hr (11/05/22 0656)   calcium  gluconate     heparin 1,000 Units/hr (11/05/22 0656)    PRN Medications:  acetaminophen, albuterol, ALPRAZolam, dextromethorphan-guaiFENesin, hydrALAZINE, ondansetron (ZOFRAN) IV, pantoprazole, zolpidem  Antimicrobials from admission:  Anti-infectives (From admission, onward)    None           Data Reviewed:  I have personally reviewed the following...  CBC: Recent Labs  Lab 11/02/22 1315 11/04/22 0951 11/05/22 0539  WBC 6.0 6.8 5.3  NEUTROABS 4.7  --   --   HGB 11.7* 12.5 11.8*  HCT 34.7* 38.0 35.0*  MCV 96.4 98.2 97.8  PLT 432* 469* 468*   Basic Metabolic Panel: Recent Labs  Lab 11/02/22 1315 11/04/22 0951 11/04/22 2342 11/05/22 0539  NA 131* 135  --  130*  K 3.4* 3.6 3.0* 5.9*  CL 92*  96*  --  97*  CO2 27 26  --  24  GLUCOSE 180* 118*  --  117*  BUN 8 6*  --  7*  CREATININE 0.73 0.63  --  0.62  CALCIUM 8.9 9.1  --  8.3*  MG  --  1.6* 1.5*  --    GFR: Estimated Creatinine Clearance: 63.1 mL/min (by C-G formula based on SCr of 0.62 mg/dL). Liver Function Tests: No results for input(s): "AST", "ALT", "ALKPHOS", "BILITOT", "PROT", "ALBUMIN" in the last 168 hours. No results for input(s): "LIPASE", "AMYLASE" in the last 168 hours. No results for input(s): "AMMONIA" in the last 168 hours. Coagulation Profile: Recent Labs  Lab 11/04/22 1322  INR 1.1   Cardiac Enzymes: No results for input(s): "CKTOTAL", "CKMB", "CKMBINDEX", "TROPONINI" in the last 168 hours. BNP (last 3 results) No results for input(s): "PROBNP" in the last 8760 hours. HbA1C: No results for input(s): "HGBA1C" in the last 72 hours. CBG: No results for input(s): "GLUCAP" in the last 168 hours. Lipid Profile: No results for input(s): "CHOL", "HDL", "LDLCALC", "TRIG", "CHOLHDL", "LDLDIRECT" in the last 72 hours. Thyroid Function Tests: Recent Labs    11/02/22 1315  TSH 0.673   Anemia Panel: No results for input(s): "VITAMINB12", "FOLATE", "FERRITIN", "TIBC", "IRON",  "RETICCTPCT" in the last 72 hours. Most Recent Urinalysis On File:  No results found for: "COLORURINE", "APPEARANCEUR", "LABSPEC", "PHURINE", "GLUCOSEU", "HGBUR", "BILIRUBINUR", "KETONESUR", "PROTEINUR", "UROBILINOGEN", "NITRITE", "LEUKOCYTESUR" Sepsis Labs: @LABRCNTIP (procalcitonin:4,lacticidven:4) Microbiology: Recent Results (from the past 240 hour(s))  SARS Coronavirus 2 by RT PCR (hospital order, performed in [facility] hospital lab) *cepheid single result test* Anterior Nasal Swab     Status: None   Collection Time: 11/04/22 10:02 AM   Specimen: Anterior Nasal Swab  Result Value Ref Range Status   SARS Coronavirus 2 by RT PCR NEGATIVE NEGATIVE Final    Comment: (NOTE) SARS-CoV-2 target nucleic acids are NOT DETECTED.  The SARS-CoV-2 RNA is generally detectable in upper and lower respiratory specimens during the acute phase of infection. The lowest concentration of SARS-CoV-2 viral copies this assay can detect is 250 copies / mL. A negative result does not preclude SARS-CoV-2 infection and should not be used as the sole basis for treatment or other patient management decisions.  A negative result may occur with improper specimen collection / handling, submission of specimen other than nasopharyngeal swab, presence of viral mutation(s) within the areas targeted by this assay, and inadequate number of viral copies (<250 copies / mL). A negative result must be combined with clinical observations, patient history, and epidemiological information.  Fact Sheet for Patients:   RoadLapTop.co.za  Fact Sheet for Healthcare Providers: http://kim-miller.com/  This test is not yet approved or  cleared by the [place] FDA and has been authorized for detection and/or diagnosis of SARS-CoV-2 by FDA under an Emergency Use Authorization (EUA).  This EUA will remain in effect (meaning this test can be used) for the duration of the COVID-19  declaration under Section 564(b)(1) of the Act, 21 U.S.C. section 360bbb-3(b)(1), unless the authorization is terminated or revoked sooner.  Performed at [facility], [address], [place], [place] [address]       Radiology Studies last 3 days: [facility] 1 View  Result Date: 11/04/2022 CLINICAL DATA:  Status post right thoracentesis. EXAM: PORTABLE CHEST 1 VIEW COMPARISON:  11/04/2022. FINDINGS: 1623 hours. Decreased, now moderate right pleural effusion status post thoracentesis. No pneumothorax. Left lung is clear. Stable cardiac and mediastinal contours. Trace  left pleural effusion. IMPRESSION: Decreased, now moderate right pleural effusion status post thoracentesis. No pneumothorax. Electronically Signed   By: Orvan Falconer M.D.   On: 11/04/2022 16:40   US THORACENTESIS ASP PLEURAL SPACE W/IMG GUIDE  Result Date: 11/04/2022 INDICATION: History of right-sided lung cancer now in remission with persistent recurrent symptomatic right pleural effusion. Request received for therapeutic right thoracentesis. EXAM: ULTRASOUND GUIDED THERAPEUTIC RIGHT THORACENTESIS MEDICATIONS: 10 mL 1 % lidocaine COMPLICATIONS: None immediate. PROCEDURE: An ultrasound guided thoracentesis was thoroughly discussed with the patient and questions answered. The benefits, risks, alternatives and complications were also discussed. The patient understands and wishes to proceed with the procedure. Written consent was obtained. Ultrasound was performed to localize and mark an adequate pocket of fluid in the right chest. The area was then prepped and draped in the normal sterile fashion. 1% Lidocaine was used for local anesthesia. Under ultrasound guidance a 6 Fr Safe-T-Centesis catheter was introduced. Thoracentesis was performed. The catheter was removed and a dressing applied. FINDINGS: A total of approximately 1.5 L of hazy, yellow fluid was removed. IMPRESSION: Successful ultrasound guided right thoracentesis  yielding 1.5 L of pleural fluid. Read by: Alex Gardener, AGNP-BC Electronically Signed   By: Olive Bass M.D.   On: 11/04/2022 16:06   CT Angio Chest PE W/Cm &/Or Wo Cm  Result Date: 11/04/2022 CLINICAL DATA:  Weakness, possible pericardial effusion versus pulmonary embolism. Tachycardia. History of lung cancer with recurrent pleural effusion. EXAM: CT ANGIOGRAPHY CHEST WITH CONTRAST TECHNIQUE: Multidetector CT imaging of the chest was performed using the standard protocol during bolus administration of intravenous contrast. Multiplanar CT image reconstructions and MIPs were obtained to evaluate the vascular anatomy. RADIATION DOSE REDUCTION: This exam was performed according to the departmental dose-optimization program which includes automated exposure control, adjustment of the mA and/or kV according to patient size and/or use of iterative reconstruction technique. CONTRAST:  OMNIPAQUE IOHEXOL 350 MG/ML SOLN COMPARISON:  Same day chest radiograph, CTA chest 10/08/2022 FINDINGS: Cardiovascular: There is adequate opacification of the pulmonary arteries to the segmental level. There is no evidence of pulmonary embolism. The heart size is normal. There is a moderate pericardial effusion measuring up to 1.8 cm in thickness. The effusion measures simple fluid attenuation. The effusion is increased since 10/08/2022. The thoracic aorta is normal in caliber with scattered calcified plaque. Mediastinum/Nodes: The thyroid is unremarkable. The esophagus is grossly unremarkable. There is no mediastinal, hilar, or axillary lymphadenopathy. Lungs/Pleura: The trachea is patent. The right lower lobe bronchus is occluded and there is narrowing of the right middle lobe bronchus. Findings are similar to the prior CT. There is a large pleural effusion with subtotal collapse of the right lung. The effusion is similar in size to the study from 10/08/2022. There is a small left pleural effusion, minimally increased since the  prior study. The left lung is otherwise clear. There is no pneumothorax. Upper Abdomen: The imaged portions of the upper abdominal viscera are unremarkable. Musculoskeletal: There is no acute osseous abnormality or suspicious osseous lesion. Review of the MIP images confirms the above findings. IMPRESSION: 1. No evidence of pulmonary embolism. 2. Moderate-sized pericardial effusion is increased since the CT from 10/08/2022. 3. Large right pleural effusion with subtotal collapse of the right lung is similar to the prior study. A smaller left effusion is increased in size. Electronically Signed   By: Lesia Hausen M.D.   On: 11/04/2022 11:00   DG Chest Port 1 View  Result Date: 11/04/2022 CLINICAL DATA:  Tachycardia EXAM: PORTABLE CHEST 1 VIEW COMPARISON:  11/02/2022 FINDINGS: Large right-sided pleural effusion. Small left-sided pleural effusion. Normal pulmonary vasculature. Cardiac silhouette obscured. No pneumothorax identified. Calcified aorta. IMPRESSION: Pleural effusions, large on the right and small on the left. Electronically Signed   By: Layla Maw M.D.   On: 11/04/2022 10:29   DG Chest 2 View  Result Date: 11/03/2022 CLINICAL DATA:  Shortness of breath, fatigue EXAM: CHEST - 2 VIEW COMPARISON:  Previous studies including the examination of 10/28/2022 FINDINGS: Transverse diameter of heart is increased. There is a significant interval increase in amount of right pleural effusion extending to the upper right hilum and in the apical region. There is interval appearance of a small left pleural effusion. There are no signs of alveolar pulmonary edema in the visualized lung fields. Evaluation of right mid and right lower lung fields for infiltrates is limited by the large effusion. There is no pneumothorax. IMPRESSION: Cardiomegaly.  There are no signs of pulmonary edema. Large right pleural effusion with interval increase in size. There is interval appearance of small left pleural effusion.  Electronically Signed   By: Ernie Avena M.D.   On: 11/03/2022 20:53             LOS: 1 day      Sunnie Nielsen, DO [facility] 11/05/2022, 8:15 AM    Dictation software may have been used to generate the above note. Typos may occur and escape review in typed/dictated notes. Please contact Dr Lyn Hollingshead directly for clarity if needed.  Staff may message me via secure chat in Epic  but this may not receive an immediate response,  please page me for urgent matters!  If 7PM-7AM, please contact night coverage [email or web address]

## 2022-11-05 NOTE — Plan of Care (Signed)

## 2022-11-05 NOTE — Progress Notes (Signed)
*  PRELIMINARY RESULTS* Echocardiogram 2D Echocardiogram has been performed.  Katherine Perry 11/05/2022, 2:41 PM

## 2022-11-05 NOTE — Hospital Course (Addendum)
Katherine Perry is a 67 y.o. female with medical history significant of lung cancer (s/p of chemo and RXT), recurrent pleural effusions undergoing scheduled monthly thoracentesis (last 1 performed 2 weeks ago), HTN, asthma (patient denies history of COPD), anxiety, who presents with shortness of breath, tachycardia described as exertional shortness of breath and heart racing. Pt was seen by Dr. Jayme Cloud of pulmonology clinic, and was found to have tachycardia with heart rate up to 180s, sent to ED. Patient states that she had thoracentesis approximately 2 weeks ago.  Patient states that she is taking metoprolol succinate XL 50 mg daily for HTN, but she has lost her metoprolol, without this medication for approximately 1 week.   04/04: new onset atrial fibrillation with RVR, heart rate up to 180s, which is converted to sinus rhythm temporarily after giving metoprolol in ED, strated on diltiazem gtt and was in/out of SR/Aflutter. WBC 6.8, TSH 0.673, negative COVID PCR, GFR> 60, temperature 98.4, blood pressure 105/80, RR 30, oxygen saturation 95% on room air.  Chest x-ray showed bilateral pleural effusion (large right pleural effusion and small left pleural effusion).  First EKG showed SVT with heart rate 180, RAD, low voltage. The repeated EKG showed atrial fibrillation, heart rate of 170 with low voltage. On CTA Chest, no PE, moderate pericardial effusion increased since CT 10/08/22, large R pleural effusion w/ subtotal collapse R lung, smaller L effusion increased from prior. Cardiology saw patient - switched to amiodarone gtt, stared po metoprolol 25 mg q6h, continue heparin gtt and will require long term AC, obtain echo to eval pericardial effusion  04/05: potassium elevated 5.9. Holding spiro, giving lasix IV, rechecking STAT was 4.6 suspect lab error. OK to restart spiro and po lasix for tomorrow. Remains on amiodarone and heparin gtt through today.  04/06:   Consultants:  Cardiology -  [facility]  Procedures: 11/04/22: US guided therapeutic right thoracentesis yielded 1.5 L hazy yellow fluid       ASSESSMENT & PLAN:   Principal Problem:   Paroxysmal atrial flutter w/ RVR Active Problems:   Recurrent pleural effusion on right   Asthma   Pericardial effusion   Primary lung adenocarcinoma, right   HTN (hypertension)   Anxiety  Atrial flutter with RVR:  Amiodarone gtt per cardiology --> d/c today  Po metoprolol 25 mg po q6h per cardiology  Continue heparin gtt  Will need anticoagulation Echocardiogram  Pericardial effusion Echocardiogram    Recurrent pleural effusion on right:  likely due to history of lung cancer.   Thoracentesis done  No fever or leukocytosis, will not order fluid lab analysis.   Hyperkalemia Possible over-supplementation for hypokalemia Holding po spironolactone Lasix IV x1 and resume po tomorrow pending recheck labs  Giving calcium since hypocalcemic as well  Repeat STAT potassium now and if still high will address further   Asthma Bronchodilators   Primary lung adenocarcinoma, right: S/p of chemo and radiation therapy.  No surgery. Follow-up with oncology   HTN: pt is taking Lasix and spironolactone Metoprolol IV hydralazine as needed   Anxiety Continue home as needed Xanax       DVT prophylaxis: heparin gtt, will eventually plan switch to anticoagulation  Pertinent IV fluids/nutrition: no continuous IV fluids  Central lines / invasive devices: none  Code Status: FULL CODE   Current Admission Status: inpatient   TOC needs / Dispo plan: TBD Barriers to discharge / significant pending items: cardiac stabilization, echo to eval pericardial effusion, still on amio and heparin gtt, expect  will be here 2-3 more days

## 2022-11-05 NOTE — Progress Notes (Signed)
ANTICOAGULATION CONSULT NOTE  Pharmacy Consult for initiation and monitoring of heparin infusion Indication: atrial fibrillation  Allergies  Allergen Reactions   Ace Inhibitors Cough    Patient Measurements: Height: 5\' 5"  (165.1 cm) Weight: 66 kg (145 lb 8.1 oz) IBW/kg (Calculated) : 57 Heparin Dosing Weight: 66 kg  Vital Signs: Temp: 98.3 F (36.8 C) (04/05 0400) Temp Source: Oral (04/05 0400) BP: 120/57 (04/05 0500) Pulse Rate: 84 (04/05 0123)  Labs: Recent Labs    11/02/22 1315 11/04/22 0951 11/04/22 1322 11/04/22 2208 11/05/22 0539  HGB 11.7* 12.5  --   --  11.8*  HCT 34.7* 38.0  --   --  35.0*  PLT 432* 469*  --   --  468*  APTT  --   --  30  --   --   LABPROT  --   --  13.6  --   --   INR  --   --  1.1  --   --   HEPARINUNFRC  --   --   --  0.54 0.34  CREATININE 0.73 0.63  --   --  0.62  TROPONINIHS  --  5 5  --   --      Estimated Creatinine Clearance: 63.1 mL/min (by C-G formula based on SCr of 0.62 mg/dL).   Medical History: Past Medical History:  Diagnosis Date   Anxiety    Asthma    Cancer    Cough    Dyspnea    with exertion   GERD (gastroesophageal reflux disease)    Hypertension    Lung cancer     Assessment: 8 YOF w/ PMH of breast cancer, HTN, recurrent pleural effusions undergoing scheduled monthly thoracentesis with new onset  atrial fibrillation. A review of medical records reveals no chronic anticoagulation prior to arrival   Baseline labs: aPTT 30s, INR 1.1  Goal of Therapy:  Heparin level 0.3-0.7 units/ml Monitor platelets by anticoagulation protocol: Yes   4/04 2208 HL 0.54, therapeutic x 1 4/05 0539 HL 0.34, therapeutic x 2, trending down  Plan:  Continue heparin infusion at 1000 units/hr Recheck HL at 1800 to reconfirm, then daily CBC daily while on heparin.  Otelia Sergeant, PharmD, [facility] 11/05/2022 6:35 AM

## 2022-11-05 NOTE — Progress Notes (Signed)
ANTICOAGULATION CONSULT NOTE  Pharmacy Consult for initiation and monitoring of heparin infusion Indication: atrial fibrillation  Allergies  Allergen Reactions   Ace Inhibitors Cough    Patient Measurements: Height: 5\' 5"  (165.1 cm) Weight: 65.1 kg (143 lb 8 oz) IBW/kg (Calculated) : 57 Heparin Dosing Weight: 66 kg  Vital Signs: Temp: 98.6 F (37 C) (04/05 1502) Temp Source: Oral (04/05 1502) BP: 123/67 (04/05 1502) Pulse Rate: 103 (04/05 1502)  Labs: Recent Labs    11/04/22 0951 11/04/22 1322 11/04/22 2208 11/05/22 0539 11/05/22 1429  HGB 12.5  --   --  11.8*  --   HCT 38.0  --   --  35.0*  --   PLT 469*  --   --  468*  --   APTT  --  30  --   --   --   LABPROT  --  13.6  --   --   --   INR  --  1.1  --   --   --   HEPARINUNFRC  --   --  0.54 0.34 0.18*  CREATININE 0.63  --   --  0.62  --   TROPONINIHS 5 5  --   --   --      Estimated Creatinine Clearance: 63.1 mL/min (by C-G formula based on SCr of 0.62 mg/dL).   Medical History: Past Medical History:  Diagnosis Date   Anxiety    Asthma    Cancer    Cough    Dyspnea    with exertion   GERD (gastroesophageal reflux disease)    Hypertension    Lung cancer     Assessment: 70 YOF w/ PMH of breast cancer, HTN, recurrent pleural effusions undergoing scheduled monthly thoracentesis with new onset  atrial fibrillation. A review of medical records reveals no chronic anticoagulation prior to arrival   Baseline labs: aPTT 30s, INR 1.1  Goal of Therapy:  Heparin level 0.3-0.7 units/ml Monitor platelets by anticoagulation protocol: Yes  Plan: heparin level subtherapeutic --bolus 2000 units IV heparin --increase heparin infusion rate to 1250 units/hr --Recheck heparin level in 6 hours after rate change, then once daily following 2 consecutive therapeutic levels --CBC daily while on heparin.  Burnis Medin, PharmD, BCPS 11/05/2022 3:32 PM

## 2022-11-06 DIAGNOSIS — I4892 Unspecified atrial flutter: Secondary | ICD-10-CM | POA: Diagnosis not present

## 2022-11-06 LAB — BASIC METABOLIC PANEL
Anion gap: 12 (ref 5–15)
BUN: 5 mg/dL — ABNORMAL LOW (ref 8–23)
CO2: 24 mmol/L (ref 22–32)
Calcium: 8.5 mg/dL — ABNORMAL LOW (ref 8.9–10.3)
Chloride: 98 mmol/L (ref 98–111)
Creatinine, Ser: 0.56 mg/dL (ref 0.44–1.00)
GFR, Estimated: >60 mL/min (ref >60)
Glucose, Bld: 130 mg/dL — ABNORMAL HIGH (ref 70–99)
Potassium: 3.5 mmol/L (ref 3.5–5.1)
Sodium: 134 mmol/L — ABNORMAL LOW (ref 135–145)

## 2022-11-06 LAB — SEDIMENTATION RATE: Sed Rate: 62 mm/hr — ABNORMAL HIGH (ref 0–30)

## 2022-11-06 LAB — CBC
HCT: 34.2 % — ABNORMAL LOW (ref 36.0–46.0)
Hemoglobin: 11.3 g/dL — ABNORMAL LOW (ref 12.0–15.0)
MCH: 32.7 pg (ref 26.0–34.0)
MCHC: 33 g/dL (ref 30.0–36.0)
MCV: 98.8 fL (ref 80.0–100.0)
Platelets: 429 10*3/uL — ABNORMAL HIGH (ref 150–400)
RBC: 3.46 MIL/uL — ABNORMAL LOW (ref 3.87–5.11)
RDW: 12.9 % (ref 11.5–15.5)
WBC: 5 10*3/uL (ref 4.0–10.5)
nRBC: 0 % (ref 0.0–0.2)

## 2022-11-06 LAB — HEPARIN LEVEL (UNFRACTIONATED)
Heparin Unfractionated: 0.53 IU/mL (ref 0.30–0.70)
Heparin Unfractionated: 0.56 IU/mL (ref 0.30–0.70)

## 2022-11-06 MED ORDER — METOPROLOL TARTRATE 25 MG PO TABS: 25.0000 mg | ORAL_TABLET | Freq: Two times a day (BID) | ORAL | 0 refills | Status: DC

## 2022-11-06 MED ORDER — AMIODARONE HCL 200 MG PO TABS
200.0000 mg | ORAL_TABLET | Freq: Two times a day (BID) | ORAL | Status: DC
  Administered 2022-11-06: 200 mg via ORAL
  Filled 2022-11-06: qty 1

## 2022-11-06 MED ORDER — APIXABAN 5 MG PO TABS: 5.0000 mg | ORAL_TABLET | Freq: Two times a day (BID) | ORAL | 0 refills | Status: DC

## 2022-11-06 MED ORDER — APIXABAN 5 MG PO TABS
5.0000 mg | ORAL_TABLET | Freq: Two times a day (BID) | ORAL | Status: DC
  Administered 2022-11-06: 5 mg via ORAL
  Filled 2022-11-06: qty 1

## 2022-11-06 MED ORDER — AMIODARONE HCL 200 MG PO TABS: ORAL_TABLET | ORAL | 0 refills | Status: DC

## 2022-11-06 NOTE — Discharge Summary (Addendum)
Physician Discharge Summary   Patient: Katherine Perry MRN: [id number]  DOB: 03-Oct-1955   Admit:     Date of Admission: 11/04/2022 Admitted from: home   Discharge: Date of discharge: 11/06/22 Disposition: Home Condition at discharge: good  CODE STATUS: FULL CODE      Discharge Physician: Sunnie Nielsen, DO [facility]     PCP: Danella Penton, MD  Recommendations for Outpatient Follow-up:  Follow up with PCP Danella Penton, MD in 2-4 weeks Follow up w/ cardiology in 1-2 weeks Please obtain labs/tests: CBC, BMP in 1-2 weeks Please follow up on the following pending results: none PCP AND OTHER OUTPATIENT PROVIDERS: SEE BELOW FOR SPECIFIC DISCHARGE INSTRUCTIONS PRINTED FOR PATIENT IN ADDITION TO GENERIC AVS PATIENT INFO    Discharge Instructions     Diet - low sodium heart healthy   Complete by: As directed    Increase activity slowly   Complete by: As directed          Discharge Diagnoses: Principal Problem:   Paroxysmal atrial flutter w/ RVR Active Problems:   Recurrent pleural effusion on right   Asthma   Pericardial effusion   Primary lung adenocarcinoma, right   HTN (hypertension)   Anxiety   Pleural effusion       Hospital Course:  Katherine Perry is a 67 y.o. female with medical history significant of lung cancer (s/p of chemo and RXT), recurrent pleural effusions undergoing scheduled monthly thoracentesis (last 1 performed 2 weeks ago), HTN, asthma (patient denies history of COPD), anxiety, who presents with shortness of breath, tachycardia described as exertional shortness of breath and heart racing. Pt was seen by Dr. Jayme Cloud of pulmonology clinic, and was found to have tachycardia with heart rate up to 180s, sent to ED. Patient states that she had thoracentesis approximately 2 weeks ago.  Patient states that she is taking metoprolol succinate XL 50 mg daily for HTN, but she has lost her metoprolol, without this medication for approximately  1 week.   04/04: new onset atrial fibrillation with RVR, heart rate up to 180s, which is converted to sinus rhythm temporarily after giving metoprolol in ED, strated on diltiazem gtt and was in/out of SR/Aflutter. WBC 6.8, TSH 0.673, negative COVID PCR, GFR> 60, temperature 98.4, blood pressure 105/80, RR 30, oxygen saturation 95% on room air.  Chest x-ray showed bilateral pleural effusion (large right pleural effusion and small left pleural effusion).  First EKG showed SVT with heart rate 180, RAD, low voltage. The repeated EKG showed atrial fibrillation, heart rate of 170 with low voltage. On CTA Chest, no PE, moderate pericardial effusion increased since CT 10/08/22, large R pleural effusion w/ subtotal collapse R lung, smaller L effusion increased from prior. Cardiology saw patient - switched to amiodarone gtt, stared po metoprolol 25 mg q6h, continue heparin gtt and will require long term AC, obtain echo to eval pericardial effusion  04/05: potassium elevated 5.9. Holding spiro, giving lasix IV, rechecking STAT was 4.6 suspect lab error. OK to restart spiro and po lasix for tomorrow. Remains on amiodarone and heparin gtt through today.  04/06: transition to po amiodarone and Eliquis, pt eager for d/c home, precautions reviewed, cardiology team saw pt this morning. Discussed can remain on telemetry through tonight and send home in AM if no concerns, vs leave this afternoon w/ strict precautions - pt prefers discharge   Consultants:  Cardiology - [facility]  Procedures: 11/04/22: US guided therapeutic right thoracentesis yielded 1.5 L  hazy yellow fluid       ASSESSMENT & PLAN:   Paroxysmal Atrial flutter with RVR:  RVR resolved  amiodarone 200 mg po bid x1 week then daily  metoprolol 25 mg po bid Eliquis 5 mg bid   Follow outpatient   Pericardial effusion Echocardiogram no concerns Follow outpatient cardiology    Recurrent pleural effusion on right:  likely due to history of lung cancer.    Thoracentesis done  No fever or leukocytosis, will not order fluid lab analysis. Follow outpatient    Hyperkalemia - likely lab error Follow outpatient   Asthma Bronchodilators   Primary lung adenocarcinoma, right: S/p of chemo and radiation therapy.  No surgery. Follow-up with oncology   HTN: Restart home Lasix and spironolactone Metoprolol adjsuted per cardiology from XL daily to IR bid    Anxiety Continue home as needed Xanax           Discharge Instructions  Allergies as of 11/06/2022       Reactions   Ace Inhibitors Cough        Medication List     STOP taking these medications    metoprolol succinate 50 MG 24 hr tablet Commonly known as: TOPROL-XL       TAKE these medications    acetaminophen 500 MG tablet Commonly known as: TYLENOL Take 500 mg by mouth every 6 (six) hours as needed.   ALPRAZolam 0.5 MG tablet Commonly known as: XANAX Take 0.5 mg by mouth at bedtime as needed for sleep.   amiodarone 200 MG tablet Commonly known as: PACERONE Take 1 tablet (200 mg total) by mouth 2 (two) times daily for 7 days, THEN 1 tablet (200 mg total) daily for 23 days. Start taking on: November 06, 2022   apixaban 5 MG Tabs tablet Commonly known as: ELIQUIS Take 1 tablet (5 mg total) by mouth 2 (two) times daily.   azelastine 0.1 % nasal spray Commonly known as: ASTELIN Place 2 sprays into both nostrils 2 (two) times daily. Use in each nostril as directed   B-12 COMPLIANCE INJECTION IJ Inject 1,000 mcg as directed. Once a month   Cholecalciferol 50 MCG (2000 UT) Caps Take 2,000 Units by mouth daily.   esomeprazole 20 MG capsule Commonly known as: NEXIUM Take 20 mg by mouth as needed.   folic acid 1 MG tablet Commonly known as: FOLVITE TAKE 1 TABLET BY MOUTH EVERY DAY   furosemide 20 MG tablet Commonly known as: LASIX Take 20 mg by mouth daily.   metoprolol tartrate 25 MG tablet Commonly known as: LOPRESSOR Take 1 tablet (25 mg total) by  mouth 2 (two) times daily.   montelukast 10 MG tablet Commonly known as: SINGULAIR Take 1 tablet by mouth at bedtime.   spironolactone 25 MG tablet Commonly known as: ALDACTONE Take 25 mg by mouth daily.   Trelegy Ellipta 100-62.5-25 MCG/ACT Aepb Generic drug: Fluticasone-Umeclidin-Vilant Inhale 1 puff into the lungs daily as needed.   zolpidem 5 MG tablet Commonly known as: AMBIEN Take 0.5 tablets by mouth at bedtime as needed.         Follow-up Information     Iran Ouch, MD. Call.   Specialty: Cardiology Why: confirm follow up appointment Contact information: [address] [place] [address] [phone]         Danella Penton, MD. Schedule an appointment as soon as possible for a visit.   Specialty: Internal Medicine Contact information: [address] [facility] [place] [address] [phone]                 Allergies  Allergen Reactions   Ace Inhibitors Cough     Subjective: pt feeling well this morning, no palpitations, no dizziness, breathing okay    Discharge Exam: BP 107/60 (BP Location: Right Arm)   Pulse 83   Temp 97.8 F (36.6 C) (Oral)   Resp 18   Ht 5\' 5"  (1.651 m)   Wt 65.1 kg   SpO2 100%   BMI 23.88 kg/m  General: Pt is alert, awake, not in acute distress Cardiovascular: RRR, S1/S2 +, no rubs, no gallops Respiratory: CTA on R, diminshed breath sounds on L base, no wheezing, no rhonchi Abdominal: Soft, NT, ND, bowel sounds + Extremities: no edema, no cyanosis     The results of significant diagnostics from this hospitalization (including imaging, microbiology, ancillary and laboratory) are listed below for reference.     Microbiology: Recent Results (from the past 240 hour(s))  SARS Coronavirus 2 by RT PCR (hospital order, performed in [facility]) *cepheid single result test* Anterior Nasal Swab     Status: None   Collection Time: 11/04/22  10:02 AM   Specimen: Anterior Nasal Swab  Result Value Ref Range Status   SARS Coronavirus 2 by RT PCR NEGATIVE NEGATIVE Final    Comment: (NOTE) SARS-CoV-2 target nucleic acids are NOT DETECTED.  The SARS-CoV-2 RNA is generally detectable in upper and lower respiratory specimens during the acute phase of infection. The lowest concentration of SARS-CoV-2 viral copies this assay can detect is 250 copies / mL. A negative result does not preclude SARS-CoV-2 infection and should not be used as the sole basis for treatment or other patient management decisions.  A negative result may occur with improper specimen collection / handling, submission of specimen other than nasopharyngeal swab, presence of viral mutation(s) within the areas targeted by this assay, and inadequate number of viral copies (<250 copies / mL). A negative result must be combined with clinical observations, patient history, and epidemiological information.  Fact Sheet for Patients:   RoadLapTop.co.zahttps://www.fda.gov/media/158405/download  Fact Sheet for Healthcare Providers: http://kim-miller.com/https://www.fda.gov/media/158404/download  This test is not yet approved or  cleared by the [place] FDA and has been authorized for detection and/or diagnosis of SARS-CoV-2 by FDA under an Emergency Use Authorization (EUA).  This EUA will remain in effect (meaning this test can be used) for the duration of the COVID-19 declaration under Section 564(b)(1) of the Act, 21 U.S.C. section 360bbb-3(b)(1), unless the authorization is terminated or revoked sooner.  Performed at [facility], [address] [place], [place] [address]      Labs: BNP (last 3 results) No results for input(s): "BNP" in the last 8760 hours. Basic Metabolic Panel: Recent Labs  Lab 11/02/22 1315 11/04/22 0951 11/04/22 2342 11/05/22 0539 11/05/22 0841 11/06/22 0632  NA 131* 135  --  130*  --  134*  K 3.4* 3.6 3.0* 5.9* 4.6 3.5  CL 92* 96*  --  97*  --  98   CO2 27 26  --  24  --  24  GLUCOSE 180* 118*  --  117*  --  130*  BUN 8 6*  --  7*  --  5*  CREATININE 0.73 0.63  --  0.62  --  0.56  CALCIUM 8.9 9.1  --  8.3*  --  8.5*  MG  --  1.6* 1.5*  --   --   --  Liver Function Tests: No results for input(s): "AST", "ALT", "ALKPHOS", "BILITOT", "PROT", "ALBUMIN" in the last 168 hours. No results for input(s): "LIPASE", "AMYLASE" in the last 168 hours. No results for input(s): "AMMONIA" in the last 168 hours. CBC: Recent Labs  Lab 11/02/22 1315 11/04/22 0951 11/05/22 0539 11/06/22 0632  WBC 6.0 6.8 5.3 5.0  NEUTROABS 4.7  --   --   --   HGB 11.7* 12.5 11.8* 11.3*  HCT 34.7* 38.0 35.0* 34.2*  MCV 96.4 98.2 97.8 98.8  PLT 432* 469* 468* 429*   Cardiac Enzymes: No results for input(s): "CKTOTAL", "CKMB", "CKMBINDEX", "TROPONINI" in the last 168 hours. BNP: Invalid input(s): "POCBNP" CBG: No results for input(s): "GLUCAP" in the last 168 hours. D-Dimer No results for input(s): "DDIMER" in the last 72 hours. Hgb A1c No results for input(s): "HGBA1C" in the last 72 hours. Lipid Profile No results for input(s): "CHOL", "HDL", "LDLCALC", "TRIG", "CHOLHDL", "LDLDIRECT" in the last 72 hours. Thyroid function studies No results for input(s): "TSH", "T4TOTAL", "T3FREE", "THYROIDAB" in the last 72 hours.  Invalid input(s): "FREET3" Anemia work up No results for input(s): "VITAMINB12", "FOLATE", "FERRITIN", "TIBC", "IRON", "RETICCTPCT" in the last 72 hours. Urinalysis No results found for: "COLORURINE", "APPEARANCEUR", "LABSPEC", "PHURINE", "GLUCOSEU", "HGBUR", "BILIRUBINUR", "KETONESUR", "PROTEINUR", "UROBILINOGEN", "NITRITE", "LEUKOCYTESUR" Sepsis Labs Recent Labs  Lab 11/02/22 1315 11/04/22 0951 11/05/22 0539 11/06/22 0632  WBC 6.0 6.8 5.3 5.0   Microbiology Recent Results (from the past 240 hour(s))  SARS Coronavirus 2 by RT PCR (hospital order, performed in [facility] hospital lab) *cepheid single result test* Anterior Nasal  Swab     Status: None   Collection Time: 11/04/22 10:02 AM   Specimen: Anterior Nasal Swab  Result Value Ref Range Status   SARS Coronavirus 2 by RT PCR NEGATIVE NEGATIVE Final    Comment: (NOTE) SARS-CoV-2 target nucleic acids are NOT DETECTED.  The SARS-CoV-2 RNA is generally detectable in upper and lower respiratory specimens during the acute phase of infection. The lowest concentration of SARS-CoV-2 viral copies this assay can detect is 250 copies / mL. A negative result does not preclude SARS-CoV-2 infection and should not be used as the sole basis for treatment or other patient management decisions.  A negative result may occur with improper specimen collection / handling, submission of specimen other than nasopharyngeal swab, presence of viral mutation(s) within the areas targeted by this assay, and inadequate number of viral copies (<250 copies / mL). A negative result must be combined with clinical observations, patient history, and epidemiological information.  Fact Sheet for Patients:   RoadLapTop.co.za  Fact Sheet for Healthcare Providers: http://kim-miller.com/  This test is not yet approved or  cleared by the [place] FDA and has been authorized for detection and/or diagnosis of SARS-CoV-2 by FDA under an Emergency Use Authorization (EUA).  This EUA will remain in effect (meaning this test can be used) for the duration of the COVID-19 declaration under Section 564(b)(1) of the Act, 21 U.S.C. section 360bbb-3(b)(1), unless the authorization is terminated or revoked sooner.  Performed at [facility], [address] [place], [place] [address]    Imaging ECHOCARDIOGRAM COMPLETE  Result Date: 11/05/2022    ECHOCARDIOGRAM REPORT   Patient Name:   Katherine Perry Date of Exam: 11/05/2022 Medical Rec #:  [id number]     Height:       65.0 in Accession #:    [id number]    Weight:       143.5 lb Date of  Birth:   07/20/1956     BSA:          1.718 m Patient Age:    66 years      BP:           115/62 mmHg Patient Gender: F             HR:           107 bpm. Exam Location:  [facility] Procedure: 2D Echo, Cardiac Doppler and Color Doppler Indications:     Atrial Fibrillation  History:         Patient has no prior history of Echocardiogram examinations.                  Arrythmias:Atrial Fibrillation, Atrial Flutter and Tachycardia;                  Risk Factors:Hypertension. Pericardial effusion, Pleural                  effusion, Lung CA.  Sonographer:     Mikki Harbor Referring Phys:  [id number] CADENCE H FURTH Diagnosing Phys: Debbe Odea MD IMPRESSIONS  1. Left ventricular ejection fraction, by estimation, is 55 to 60%. The left ventricle has normal function. The left ventricle has no regional wall motion abnormalities. Left ventricular diastolic parameters were normal.  2. Right ventricular systolic function is normal. The right ventricular size is normal.  3. Moderate pericardial effusion. The pericardial effusion is circumferential. There is no evidence of cardiac tamponade.  4. The mitral valve is normal in structure. Mild mitral valve regurgitation.  5. The aortic valve is tricuspid. Aortic valve regurgitation is not visualized. Aortic valve sclerosis is present, with no evidence of aortic valve stenosis.  6. The inferior vena cava is normal in size with <50% respiratory variability, suggesting right atrial pressure of 8 mmHg. FINDINGS  Left Ventricle: Left ventricular ejection fraction, by estimation, is 55 to 60%. The left ventricle has normal function. The left ventricle has no regional wall motion abnormalities. The left ventricular internal cavity size was normal in size. There is  no left ventricular hypertrophy. Left ventricular diastolic parameters were normal. Right Ventricle: The right ventricular size is normal. No increase in right ventricular wall thickness. Right ventricular systolic function is normal.  Left Atrium: Left atrial size was normal in size. Right Atrium: Right atrial size was normal in size. Pericardium: A moderately sized pericardial effusion is present. The pericardial effusion is circumferential. There is no evidence of cardiac tamponade. Mitral Valve: The mitral valve is normal in structure. Mild mitral valve regurgitation. MV peak gradient, 5.3 mmHg. The mean mitral valve gradient is 3.0 mmHg. Tricuspid Valve: The tricuspid valve is normal in structure. Tricuspid valve regurgitation is not demonstrated. Aortic Valve: The aortic valve is tricuspid. Aortic valve regurgitation is not visualized. Aortic valve sclerosis is present, with no evidence of aortic valve stenosis. Aortic valve mean gradient measures 5.0 mmHg. Aortic valve peak gradient measures 9.9  mmHg. Aortic valve area, by VTI measures 2.59 cm. Pulmonic Valve: The pulmonic valve was normal in structure. Pulmonic valve regurgitation is trivial. Aorta: The aortic root is normal in size and structure. Venous: The inferior vena cava is normal in size with less than 50% respiratory variability, suggesting right atrial pressure of 8 mmHg. IAS/Shunts: No atrial level shunt detected by color flow Doppler.  LEFT VENTRICLE PLAX 2D LVIDd:         4.10 cm   Diastology LVIDs:  2.90 cm   LV e' medial:    10.10 cm/s LV PW:         1.00 cm   LV E/e' medial:  11.6 LV IVS:        0.90 cm   LV e' lateral:   8.49 cm/s LVOT diam:     1.90 cm   LV E/e' lateral: 13.8 LV SV:         72 LV SV Index:   42 LVOT Area:     2.84 cm  RIGHT VENTRICLE RV Basal diam:  2.85 cm RV Mid diam:    1.90 cm RV S prime:     9.95 cm/s TAPSE (M-mode): 2.1 cm LEFT ATRIUM             Index        RIGHT ATRIUM           Index LA diam:        3.60 cm 2.10 cm/m   RA Area:     16.00 cm LA Vol (A2C):   51.4 ml 29.92 ml/m  RA Volume:   39.20 ml  22.82 ml/m LA Vol (A4C):   46.9 ml 27.30 ml/m LA Biplane Vol: 51.1 ml 29.75 ml/m  AORTIC VALVE                     PULMONIC VALVE AV  Area (Vmax):    2.29 cm      PV Vmax:       0.90 m/s AV Area (Vmean):   2.37 cm      PV Peak grad:  3.3 mmHg AV Area (VTI):     2.59 cm AV Vmax:           157.00 cm/s AV Vmean:          108.000 cm/s AV VTI:            0.278 m AV Peak Grad:      9.9 mmHg AV Mean Grad:      5.0 mmHg LVOT Vmax:         127.00 cm/s LVOT Vmean:        90.300 cm/s LVOT VTI:          0.254 m LVOT/AV VTI ratio: 0.91  AORTA Ao Root diam: 3.00 cm MITRAL VALVE MV Area (PHT): 5.20 cm     SHUNTS MV Area VTI:   2.76 cm     Systemic VTI:  0.25 m MV Peak grad:  5.3 mmHg     Systemic Diam: 1.90 cm MV Mean grad:  3.0 mmHg MV Vmax:       1.15 m/s MV Vmean:      76.7 cm/s MV Decel Time: 146 msec MV E velocity: 117.00 cm/s MV A velocity: 108.00 cm/s MV E/A ratio:  1.08 Debbe Odea MD Electronically signed by Debbe Odea MD Signature Date/Time: 11/05/2022/5:13:17 PM    Final       Time coordinating discharge: over 30 minutes  SIGNED:  Sunnie Nielsen DO [facility]

## 2022-11-06 NOTE — Progress Notes (Signed)
ANTICOAGULATION CONSULT NOTE  Pharmacy Consult for initiation and monitoring of heparin infusion Indication: atrial fibrillation  Allergies  Allergen Reactions   Ace Inhibitors Cough    Patient Measurements: Height: 5\' 5"  (165.1 cm) Weight: 65.1 kg (143 lb 8 oz) IBW/kg (Calculated) : 57 Heparin Dosing Weight: 66 kg  Vital Signs: Temp: 98.2 F (36.8 C) (04/06 0713) Temp Source: Oral (04/06 0713) BP: 117/66 (04/06 0713) Pulse Rate: 95 (04/06 0713)  Labs: Recent Labs    11/04/22 0951 11/04/22 1322 11/04/22 2208 11/05/22 0539 11/05/22 1429 11/06/22 0014 11/06/22 0632  HGB 12.5  --   --  11.8*  --   --  11.3*  HCT 38.0  --   --  35.0*  --   --  34.2*  PLT 469*  --   --  468*  --   --  429*  APTT  --  30  --   --   --   --   --   LABPROT  --  13.6  --   --   --   --   --   INR  --  1.1  --   --   --   --   --   HEPARINUNFRC  --   --    < > 0.34 0.18* 0.56 0.53  CREATININE 0.63  --   --  0.62  --   --   --   TROPONINIHS 5 5  --   --   --   --   --    < > = values in this interval not displayed.     Estimated Creatinine Clearance: 63.1 mL/min (by C-G formula based on SCr of 0.62 mg/dL).   Medical History: Past Medical History:  Diagnosis Date   Anxiety    Asthma    Cancer    Cough    Dyspnea    with exertion   GERD (gastroesophageal reflux disease)    Hypertension    Lung cancer     Assessment: 40 YOF w/ PMH of breast cancer, HTN, recurrent pleural effusions undergoing scheduled monthly thoracentesis with new onset  atrial fibrillation. A review of medical records reveals no chronic anticoagulation prior to arrival   Baseline labs: aPTT 30s, INR 1.1  Goal of Therapy:  Heparin level 0.3-0.7 units/ml Monitor platelets by anticoagulation protocol: Yes  Date/Time: HL: Rate: 4/6@0014  0.56 Thera x 1@1250  units/hr 4/6@0632  0.53 Thera x 2@1250  units/hr  Plan: --Continue heparin infusion rate at 1250 units/hr --Recheck heparin level with AM labs --CBC  daily while on heparin.  Bettey Costa, PharmD Clinical Pharmacist 11/06/2022 7:26 AM

## 2022-11-06 NOTE — Plan of Care (Signed)

## 2022-11-06 NOTE — Progress Notes (Signed)
ANTICOAGULATION CONSULT NOTE  Pharmacy Consult for initiation and monitoring of heparin infusion Indication: atrial fibrillation  Allergies  Allergen Reactions   Ace Inhibitors Cough    Patient Measurements: Height: 5\' 5"  (165.1 cm) Weight: 65.1 kg (143 lb 8 oz) IBW/kg (Calculated) : 57 Heparin Dosing Weight: 66 kg  Vital Signs: Temp: 98.7 F (37.1 C) (04/05 2329) Temp Source: Oral (04/05 2300) BP: 109/68 (04/05 2329) Pulse Rate: 96 (04/05 2329)  Labs: Recent Labs    11/04/22 0951 11/04/22 1322 11/04/22 2208 11/05/22 0539 11/05/22 1429 11/06/22 0014  HGB 12.5  --   --  11.8*  --   --   HCT 38.0  --   --  35.0*  --   --   PLT 469*  --   --  468*  --   --   APTT  --  30  --   --   --   --   LABPROT  --  13.6  --   --   --   --   INR  --  1.1  --   --   --   --   HEPARINUNFRC  --   --    < > 0.34 0.18* 0.56  CREATININE 0.63  --   --  0.62  --   --   TROPONINIHS 5 5  --   --   --   --    < > = values in this interval not displayed.     Estimated Creatinine Clearance: 63.1 mL/min (by C-G formula based on SCr of 0.62 mg/dL).   Medical History: Past Medical History:  Diagnosis Date   Anxiety    Asthma    Cancer    Cough    Dyspnea    with exertion   GERD (gastroesophageal reflux disease)    Hypertension    Lung cancer     Assessment: 69 YOF w/ PMH of breast cancer, HTN, recurrent pleural effusions undergoing scheduled monthly thoracentesis with new onset  atrial fibrillation. A review of medical records reveals no chronic anticoagulation prior to arrival   Baseline labs: aPTT 30s, INR 1.1  Goal of Therapy:  Heparin level 0.3-0.7 units/ml Monitor platelets by anticoagulation protocol: Yes  Plan: heparin level therapeutic x 1 --Continue heparin infusion rate at 1250 units/hr --Recheck heparin level in 6 hours to confirm --CBC daily while on heparin.  Otelia Sergeant, PharmD, [facility] 11/06/2022 12:47 AM

## 2022-11-06 NOTE — Progress Notes (Signed)
Rounding Note    Patient Name: Katherine Perry Date of Encounter: 11/06/2022  [facility] Cardiologist: Lorine Bears, MD   Subjective   Patient is in and out of afib on amiodarone. She is overall feeling much better.   Inpatient Medications    Scheduled Meds:  cholecalciferol  2,000 Units Oral Daily   fluticasone furoate-vilanterol  1 puff Inhalation Daily   And   umeclidinium bromide  1 puff Inhalation Daily   folic acid  1 mg Oral Daily   furosemide  20 mg Oral Daily   metoprolol tartrate  25 mg Oral BID   montelukast  10 mg Oral QHS   pneumococcal 20-valent conjugate vaccine  0.5 mL Intramuscular Tomorrow-1000   spironolactone  25 mg Oral Daily   Continuous Infusions:  amiodarone 30 mg/hr (11/06/22 0658)   heparin 1,250 Units/hr (11/06/22 0923)   PRN Meds: acetaminophen, albuterol, ALPRAZolam, dextromethorphan-guaiFENesin, hydrALAZINE, ondansetron (ZOFRAN) IV, mouth rinse, pantoprazole, zolpidem   Vital Signs    Vitals:   11/06/22 0247 11/06/22 0332 11/06/22 0614 11/06/22 0713  BP: 130/64 109/75 120/67 117/66  Pulse:  (!) 111 96 95  Resp:  17  (!) 23  Temp:  98.5 F (36.9 C)  98.2 F (36.8 C)  TempSrc:  Oral  Oral  SpO2:  96%  98%  Weight:      Height:        Intake/Output Summary (Last 24 hours) at 11/06/2022 1001 Last data filed at 11/06/2022 0658 Gross per 24 hour  Intake 1107.57 ml  Output --  Net 1107.57 ml      11/05/2022    8:26 AM 11/04/2022    9:39 AM 11/04/2022    8:22 AM  Last 3 Weights  Weight (lbs) 143 lb 8 oz 145 lb 8.1 oz 146 lb 6.4 oz  Weight (kg) 65.091 kg 66 kg 66.407 kg      Telemetry    NSR with paroxysmal Afib - Personally Reviewed  ECG    No new - Personally Reviewed  Physical Exam   GEN: No acute distress.   Neck: No JVD Cardiac: RRR, no murmurs, rubs, or gallops.  Respiratory: Clear to auscultation bilaterally. GI: Soft, nontender, non-distended  MS: No edema; No deformity. Neuro:  Nonfocal  Psych: Normal  affect   Labs    High Sensitivity Troponin:   Recent Labs  Lab 11/04/22 0951 11/04/22 1322  TROPONINIHS 5 5     Chemistry Recent Labs  Lab 11/04/22 0951 11/04/22 2342 11/05/22 0539 11/05/22 0841 11/06/22 0632  NA 135  --  130*  --  134*  K 3.6 3.0* 5.9* 4.6 3.5  CL 96*  --  97*  --  98  CO2 26  --  24  --  24  GLUCOSE 118*  --  117*  --  130*  BUN 6*  --  7*  --  5*  CREATININE 0.63  --  0.62  --  0.56  CALCIUM 9.1  --  8.3*  --  8.5*  MG 1.6* 1.5*  --   --   --   GFRNONAA >60  --  >60  --  >60  ANIONGAP 13  --  9  --  12    Lipids No results for input(s): "CHOL", "TRIG", "HDL", "LABVLDL", "LDLCALC", "CHOLHDL" in the last 168 hours.  Hematology Recent Labs  Lab 11/04/22 0951 11/05/22 0539 11/06/22 0632  WBC 6.8 5.3 5.0  RBC 3.87 3.58* 3.46*  HGB 12.5 11.8*  11.3*  HCT 38.0 35.0* 34.2*  MCV 98.2 97.8 98.8  MCH 32.3 33.0 32.7  MCHC 32.9 33.7 33.0  RDW 12.8 12.7 12.9  PLT 469* 468* 429*   Thyroid  Recent Labs  Lab 11/02/22 1315  TSH 0.673    BNPNo results for input(s): "BNP", "PROBNP" in the last 168 hours.  DDimer No results for input(s): "DDIMER" in the last 168 hours.   Radiology    ECHOCARDIOGRAM COMPLETE  Result Date: 11/05/2022    ECHOCARDIOGRAM REPORT   Patient Name:   Penni HomansMELA C Corker Date of Exam: 11/05/2022 Medical Rec #:  [id number]     Height:       65.0 in Accession #:    [id number]    Weight:       143.5 lb Date of Birth:  08/29/1955     BSA:          1.718 m Patient Age:    67 years      BP:           115/62 mmHg Patient Gender: F             HR:           107 bpm. Exam Location:  [facility] Procedure: 2D Echo, Cardiac Doppler and Color Doppler Indications:     Atrial Fibrillation  History:         Patient has no prior history of Echocardiogram examinations.                  Arrythmias:Atrial Fibrillation, Atrial Flutter and Tachycardia;                  Risk Factors:Hypertension. Pericardial effusion, Pleural                  effusion, Lung CA.   Sonographer:     Mikki Harbororothy Buchanan Referring Phys:  [id number] Lana Flaim H Jory Welke Diagnosing Phys: Debbe OdeaBrian Agbor-Etang MD IMPRESSIONS  1. Left ventricular ejection fraction, by estimation, is 55 to 60%. The left ventricle has normal function. The left ventricle has no regional wall motion abnormalities. Left ventricular diastolic parameters were normal.  2. Right ventricular systolic function is normal. The right ventricular size is normal.  3. Moderate pericardial effusion. The pericardial effusion is circumferential. There is no evidence of cardiac tamponade.  4. The mitral valve is normal in structure. Mild mitral valve regurgitation.  5. The aortic valve is tricuspid. Aortic valve regurgitation is not visualized. Aortic valve sclerosis is present, with no evidence of aortic valve stenosis.  6. The inferior vena cava is normal in size with <50% respiratory variability, suggesting right atrial pressure of 8 mmHg. FINDINGS  Left Ventricle: Left ventricular ejection fraction, by estimation, is 55 to 60%. The left ventricle has normal function. The left ventricle has no regional wall motion abnormalities. The left ventricular internal cavity size was normal in size. There is  no left ventricular hypertrophy. Left ventricular diastolic parameters were normal. Right Ventricle: The right ventricular size is normal. No increase in right ventricular wall thickness. Right ventricular systolic function is normal. Left Atrium: Left atrial size was normal in size. Right Atrium: Right atrial size was normal in size. Pericardium: A moderately sized pericardial effusion is present. The pericardial effusion is circumferential. There is no evidence of cardiac tamponade. Mitral Valve: The mitral valve is normal in structure. Mild mitral valve regurgitation. MV peak gradient, 5.3 mmHg. The mean mitral valve gradient is 3.0 mmHg. Tricuspid Valve: The tricuspid valve is  normal in structure. Tricuspid valve regurgitation is not demonstrated.  Aortic Valve: The aortic valve is tricuspid. Aortic valve regurgitation is not visualized. Aortic valve sclerosis is present, with no evidence of aortic valve stenosis. Aortic valve mean gradient measures 5.0 mmHg. Aortic valve peak gradient measures 9.9  mmHg. Aortic valve area, by VTI measures 2.59 cm. Pulmonic Valve: The pulmonic valve was normal in structure. Pulmonic valve regurgitation is trivial. Aorta: The aortic root is normal in size and structure. Venous: The inferior vena cava is normal in size with less than 50% respiratory variability, suggesting right atrial pressure of 8 mmHg. IAS/Shunts: No atrial level shunt detected by color flow Doppler.  LEFT VENTRICLE PLAX 2D LVIDd:         4.10 cm   Diastology LVIDs:         2.90 cm   LV e' medial:    10.10 cm/s LV PW:         1.00 cm   LV E/e' medial:  11.6 LV IVS:        0.90 cm   LV e' lateral:   8.49 cm/s LVOT diam:     1.90 cm   LV E/e' lateral: 13.8 LV SV:         72 LV SV Index:   42 LVOT Area:     2.84 cm  RIGHT VENTRICLE RV Basal diam:  2.85 cm RV Mid diam:    1.90 cm RV S prime:     9.95 cm/s TAPSE (M-mode): 2.1 cm LEFT ATRIUM             Index        RIGHT ATRIUM           Index LA diam:        3.60 cm 2.10 cm/m   RA Area:     16.00 cm LA Vol (A2C):   51.4 ml 29.92 ml/m  RA Volume:   39.20 ml  22.82 ml/m LA Vol (A4C):   46.9 ml 27.30 ml/m LA Biplane Vol: 51.1 ml 29.75 ml/m  AORTIC VALVE                     PULMONIC VALVE AV Area (Vmax):    2.29 cm      PV Vmax:       0.90 m/s AV Area (Vmean):   2.37 cm      PV Peak grad:  3.3 mmHg AV Area (VTI):     2.59 cm AV Vmax:           157.00 cm/s AV Vmean:          108.000 cm/s AV VTI:            0.278 m AV Peak Grad:      9.9 mmHg AV Mean Grad:      5.0 mmHg LVOT Vmax:         127.00 cm/s LVOT Vmean:        90.300 cm/s LVOT VTI:          0.254 m LVOT/AV VTI ratio: 0.91  AORTA Ao Root diam: 3.00 cm MITRAL VALVE MV Area (PHT): 5.20 cm     SHUNTS MV Area VTI:   2.76 cm     Systemic VTI:  0.25 m MV  Peak grad:  5.3 mmHg     Systemic Diam: 1.90 cm MV Mean grad:  3.0 mmHg MV Vmax:       1.15 m/s MV Vmean:  76.7 cm/s MV Decel Time: 146 msec MV E velocity: 117.00 cm/s MV A velocity: 108.00 cm/s MV E/A ratio:  1.08 Debbe Odea MD Electronically signed by Debbe Odea MD Signature Date/Time: 11/05/2022/5:13:17 PM    Final    DG Chest Port 1 View  Result Date: 11/04/2022 CLINICAL DATA:  Status post right thoracentesis. EXAM: PORTABLE CHEST 1 VIEW COMPARISON:  11/04/2022. FINDINGS: 1623 hours. Decreased, now moderate right pleural effusion status post thoracentesis. No pneumothorax. Left lung is clear. Stable cardiac and mediastinal contours. Trace left pleural effusion. IMPRESSION: Decreased, now moderate right pleural effusion status post thoracentesis. No pneumothorax. Electronically Signed   By: Orvan Falconer M.D.   On: 11/04/2022 16:40   US THORACENTESIS ASP PLEURAL SPACE W/IMG GUIDE  Result Date: 11/04/2022 INDICATION: History of right-sided lung cancer now in remission with persistent recurrent symptomatic right pleural effusion. Request received for therapeutic right thoracentesis. EXAM: ULTRASOUND GUIDED THERAPEUTIC RIGHT THORACENTESIS MEDICATIONS: 10 mL 1 % lidocaine COMPLICATIONS: None immediate. PROCEDURE: An ultrasound guided thoracentesis was thoroughly discussed with the patient and questions answered. The benefits, risks, alternatives and complications were also discussed. The patient understands and wishes to proceed with the procedure. Written consent was obtained. Ultrasound was performed to localize and mark an adequate pocket of fluid in the right chest. The area was then prepped and draped in the normal sterile fashion. 1% Lidocaine was used for local anesthesia. Under ultrasound guidance a 6 Fr Safe-T-Centesis catheter was introduced. Thoracentesis was performed. The catheter was removed and a dressing applied. FINDINGS: A total of approximately 1.5 L of hazy, yellow fluid  was removed. IMPRESSION: Successful ultrasound guided right thoracentesis yielding 1.5 L of pleural fluid. Read by: Alex Gardener, AGNP-BC Electronically Signed   By: Olive Bass M.D.   On: 11/04/2022 16:06   CT Angio Chest PE W/Cm &/Or Wo Cm  Result Date: 11/04/2022 CLINICAL DATA:  Weakness, possible pericardial effusion versus pulmonary embolism. Tachycardia. History of lung cancer with recurrent pleural effusion. EXAM: CT ANGIOGRAPHY CHEST WITH CONTRAST TECHNIQUE: Multidetector CT imaging of the chest was performed using the standard protocol during bolus administration of intravenous contrast. Multiplanar CT image reconstructions and MIPs were obtained to evaluate the vascular anatomy. RADIATION DOSE REDUCTION: This exam was performed according to the departmental dose-optimization program which includes automated exposure control, adjustment of the mA and/or kV according to patient size and/or use of iterative reconstruction technique. CONTRAST:  OMNIPAQUE IOHEXOL 350 MG/ML SOLN COMPARISON:  Same day chest radiograph, CTA chest 10/08/2022 FINDINGS: Cardiovascular: There is adequate opacification of the pulmonary arteries to the segmental level. There is no evidence of pulmonary embolism. The heart size is normal. There is a moderate pericardial effusion measuring up to 1.8 cm in thickness. The effusion measures simple fluid attenuation. The effusion is increased since 10/08/2022. The thoracic aorta is normal in caliber with scattered calcified plaque. Mediastinum/Nodes: The thyroid is unremarkable. The esophagus is grossly unremarkable. There is no mediastinal, hilar, or axillary lymphadenopathy. Lungs/Pleura: The trachea is patent. The right lower lobe bronchus is occluded and there is narrowing of the right middle lobe bronchus. Findings are similar to the prior CT. There is a large pleural effusion with subtotal collapse of the right lung. The effusion is similar in size to the study from  10/08/2022. There is a small left pleural effusion, minimally increased since the prior study. The left lung is otherwise clear. There is no pneumothorax. Upper Abdomen: The imaged portions of the upper abdominal viscera are unremarkable. Musculoskeletal: There  is no acute osseous abnormality or suspicious osseous lesion. Review of the MIP images confirms the above findings. IMPRESSION: 1. No evidence of pulmonary embolism. 2. Moderate-sized pericardial effusion is increased since the CT from 10/08/2022. 3. Large right pleural effusion with subtotal collapse of the right lung is similar to the prior study. A smaller left effusion is increased in size. Electronically Signed   By: Lesia Hausen M.D.   On: 11/04/2022 11:00   DG Chest Port 1 View  Result Date: 11/04/2022 CLINICAL DATA:  Tachycardia EXAM: PORTABLE CHEST 1 VIEW COMPARISON:  11/02/2022 FINDINGS: Large right-sided pleural effusion. Small left-sided pleural effusion. Normal pulmonary vasculature. Cardiac silhouette obscured. No pneumothorax identified. Calcified aorta. IMPRESSION: Pleural effusions, large on the right and small on the left. Electronically Signed   By: Layla Maw M.D.   On: 11/04/2022 10:29    Cardiac Studies   Echo 11/05/22  1. Left ventricular ejection fraction, by estimation, is 55 to 60%. The  left ventricle has normal function. The left ventricle has no regional  wall motion abnormalities. Left ventricular diastolic parameters were  normal.   2. Right ventricular systolic function is normal. The right ventricular  size is normal.   3. Moderate pericardial effusion. The pericardial effusion is  circumferential. There is no evidence of cardiac tamponade.   4. The mitral valve is normal in structure. Mild mitral valve  regurgitation.   5. The aortic valve is tricuspid. Aortic valve regurgitation is not  visualized. Aortic valve sclerosis is present, with no evidence of aortic  valve stenosis.   6. The inferior vena  cava is normal in size with <50% respiratory  variability, suggesting right atrial pressure of 8 mmHg.   Patient Profile     67 y.o. female  breast cancer no longer on chemotherapy, recurrent pleural effusions undergoing monthly thoracentesis last 1 performed 2 weeks ago, HTN who is being seen 11/05/2022 for the follow up of atrial fibrillation.   Assessment & Plan    New onset Atrial flutter - in and out of Afib on amiodarone - CHADSVASC at least 3. Continue IV heparin, plan to transition to Eliquis at discharge - echo showed normal LVEF - Keep Mag>2 and K>4 - can change amio to oral, however unsure it is beneficial given she is still in and out of afib - continue rate control with Lopressor 25mg  BID  Lung cancer s/p chemo and XRT Recurrent pleural effusion -  Patient follows closely with pulmonology - She undergoes repeat thoracentesis, may need Pleurx catheter - She takes Torsemide 20mg  daily and spiro 25mg  daily - IR performed therapeutic US guided right thoracentesis on 11/04/2022; yielded 1.5L of fluid - pending labs   Pericardial effusion - echo showed LVEF 55-60%,moderate pericardial effusion with no evidence of tamponade - started on lasix 20mg  daily - check CRP and sed rate - will need to follow with serial limited echocardiograms  For questions or updates, please contact [facility] Please consult [email or web address] for contact info under      Signed, Maudine Kluesner David Stall, PA-C  11/06/2022, 10:01 AM

## 2022-11-15 ENCOUNTER — Telehealth: Payer: Self-pay | Admitting: Student in an Organized Health Care Education/Training Program

## 2022-11-15 DIAGNOSIS — J90 Pleural effusion, not elsewhere classified: Secondary | ICD-10-CM

## 2022-11-15 NOTE — Telephone Encounter (Signed)
I spoke with the patient. Increased SOB and Tiredness since Saturday. Feels like it is fluid on her lungs. Dry cough Little wheezing  Trelegy daily Eliquis

## 2022-11-15 NOTE — Telephone Encounter (Addendum)
I have notified the patient and scheduled her an appt for 11/22/2022 at 11:15am. She did take her Eliquis today at 6:00am.  Per Dr. Aundria Rud, she will need to be off the Eliquis for at least 24 hours, preferable 48.  I have notified the patient and Synetta Fail the patient care coordinator.   Nothing further needed.

## 2022-11-15 NOTE — Telephone Encounter (Signed)
PT calling wanting an appt to drain her lungs in office. Tried to find 1/2 hr appt but none open until the end of April.   PT would like a call back to see if we can work her in. States she feels very drained.  Pls call her @ [phone]  TY

## 2022-11-16 ENCOUNTER — Encounter: Payer: Self-pay | Admitting: Student in an Organized Health Care Education/Training Program

## 2022-11-16 ENCOUNTER — Ambulatory Visit
Admission: RE | Admit: 2022-11-16 | Discharge: 2022-11-16 | Disposition: A | Discharge status: Home / Self Care | Payer: Medicare Other | Source: Ambulatory Visit | Attending: Student in an Organized Health Care Education/Training Program | Admitting: Student in an Organized Health Care Education/Training Program

## 2022-11-16 ENCOUNTER — Ambulatory Visit
Admission: RE | Admit: 2022-11-16 | Discharge: 2022-11-16 | Disposition: A | Discharge status: Home / Self Care | Payer: Medicare Other | Source: Ambulatory Visit | Attending: Oncology | Admitting: Oncology

## 2022-11-16 ENCOUNTER — Ambulatory Visit
Admission: RE | Admit: 2022-11-16 | Discharge: 2022-11-16 | Disposition: A | Discharge status: Home / Self Care | Payer: Medicare Other | Source: Ambulatory Visit | Attending: Radiology | Admitting: Radiology

## 2022-11-16 ENCOUNTER — Other Ambulatory Visit: Payer: Self-pay | Admitting: Radiology

## 2022-11-16 DIAGNOSIS — J90 Pleural effusion, not elsewhere classified: Secondary | ICD-10-CM | POA: Insufficient documentation

## 2022-11-16 DIAGNOSIS — Z9889 Other specified postprocedural states: Secondary | ICD-10-CM

## 2022-11-16 DIAGNOSIS — C3491 Malignant neoplasm of unspecified part of right bronchus or lung: Secondary | ICD-10-CM | POA: Diagnosis not present

## 2022-11-16 MED ORDER — IOHEXOL 300 MG/ML  SOLN
85.0000 mL | Freq: Once | INTRAMUSCULAR | Status: AC | PRN
  Administered 2022-11-16: 85 mL via INTRAVENOUS

## 2022-11-16 MED ORDER — LIDOCAINE HCL (PF) 1 % IJ SOLN
10.0000 mL | Freq: Once | INTRAMUSCULAR | Status: AC
  Administered 2022-11-16: 10 mL via INTRADERMAL

## 2022-11-16 NOTE — Procedures (Signed)
Ultrasound-guided therapeutic right sided thoracentesis performed yielding 1 liters of amber colored fluid. No immediate complications.  . Follow-up chest x-ray pending. EBL is < 2 ml.

## 2022-11-22 ENCOUNTER — Encounter: Payer: Self-pay | Admitting: Student in an Organized Health Care Education/Training Program

## 2022-11-22 ENCOUNTER — Inpatient Hospital Stay: Payer: Medicare Other | Admitting: Oncology

## 2022-11-22 ENCOUNTER — Encounter: Payer: Self-pay | Admitting: Oncology

## 2022-11-22 ENCOUNTER — Ambulatory Visit: Payer: Medicare Other | Admitting: Student in an Organized Health Care Education/Training Program

## 2022-11-22 ENCOUNTER — Inpatient Hospital Stay: Payer: Medicare Other | Attending: Oncology

## 2022-11-22 ENCOUNTER — Other Ambulatory Visit: Payer: Self-pay | Admitting: *Deleted

## 2022-11-22 VITALS — BP 139/82 | HR 110 | Temp 95.1°F | Resp 18 | Ht 65.0 in | Wt 138.0 lb

## 2022-11-22 VITALS — BP 128/78 | HR 118 | Temp 98.1°F | Ht 65.0 in | Wt 137.2 lb

## 2022-11-22 DIAGNOSIS — C3431 Malignant neoplasm of lower lobe, right bronchus or lung: Secondary | ICD-10-CM

## 2022-11-22 DIAGNOSIS — J90 Pleural effusion, not elsewhere classified: Secondary | ICD-10-CM

## 2022-11-22 DIAGNOSIS — Z85118 Personal history of other malignant neoplasm of bronchus and lung: Secondary | ICD-10-CM | POA: Diagnosis not present

## 2022-11-22 DIAGNOSIS — Z7901 Long term (current) use of anticoagulants: Secondary | ICD-10-CM | POA: Diagnosis not present

## 2022-11-22 DIAGNOSIS — Z08 Encounter for follow-up examination after completed treatment for malignant neoplasm: Secondary | ICD-10-CM | POA: Diagnosis not present

## 2022-11-22 LAB — COMPREHENSIVE METABOLIC PANEL
ALT: 13 U/L (ref 0–44)
AST: 19 U/L (ref 15–41)
Albumin: 3.5 g/dL (ref 3.5–5.0)
Alkaline Phosphatase: 128 U/L — ABNORMAL HIGH (ref 38–126)
Anion gap: 16 — ABNORMAL HIGH (ref 5–15)
BUN: 10 mg/dL (ref 8–23)
CO2: 24 mmol/L (ref 22–32)
Calcium: 8.8 mg/dL — ABNORMAL LOW (ref 8.9–10.3)
Chloride: 89 mmol/L — ABNORMAL LOW (ref 98–111)
Creatinine, Ser: 0.85 mg/dL (ref 0.44–1.00)
GFR, Estimated: >60 mL/min (ref >60)
Glucose, Bld: 91 mg/dL (ref 70–99)
Potassium: 3.5 mmol/L (ref 3.5–5.1)
Sodium: 129 mmol/L — ABNORMAL LOW (ref 135–145)
Total Bilirubin: 0.7 mg/dL (ref 0.3–1.2)
Total Protein: 8.2 g/dL — ABNORMAL HIGH (ref 6.5–8.1)

## 2022-11-22 LAB — CBC WITH DIFFERENTIAL/PLATELET
Abs Immature Granulocytes: 0.02 10*3/uL (ref 0.00–0.07)
Basophils Absolute: 0 10*3/uL (ref 0.0–0.1)
Basophils Relative: 1 %
Eosinophils Absolute: 0 10*3/uL (ref 0.0–0.5)
Eosinophils Relative: 1 %
HCT: 35.1 % — ABNORMAL LOW (ref 36.0–46.0)
Hemoglobin: 11.6 g/dL — ABNORMAL LOW (ref 12.0–15.0)
Immature Granulocytes: 0 %
Lymphocytes Relative: 16 %
Lymphs Abs: 0.9 10*3/uL (ref 0.7–4.0)
MCH: 31.9 pg (ref 26.0–34.0)
MCHC: 33 g/dL (ref 30.0–36.0)
MCV: 96.4 fL (ref 80.0–100.0)
Monocytes Absolute: 0.6 10*3/uL (ref 0.1–1.0)
Monocytes Relative: 10 %
Neutro Abs: 4 10*3/uL (ref 1.7–7.7)
Neutrophils Relative %: 72 %
Platelets: 527 10*3/uL — ABNORMAL HIGH (ref 150–400)
RBC: 3.64 MIL/uL — ABNORMAL LOW (ref 3.87–5.11)
RDW: 12.2 % (ref 11.5–15.5)
WBC: 5.6 10*3/uL (ref 4.0–10.5)
nRBC: 0 % (ref 0.0–0.2)

## 2022-11-22 NOTE — Progress Notes (Addendum)
Hematology/Oncology Consult note [facility]  Telephone:([phone] [phone]  Patient Care Team: Danella Penton, MD as PCP - General (Internal Medicine) Iran Ouch, MD as PCP - Cardiology (Cardiology) Glory Buff, RN as Oncology Nurse Navigator [name], MD as Consulting Physician (Pulmonary Disease) Creig Hines, MD as Consulting Physician (Oncology)   Name of the patient: Katherine Perry  [id number]  [birth year]   Date of visit: 11/22/22  Diagnosis-history of stage III right lung cancer in June 2022 currently in remission  Chief complaint/ Reason for visit-surveillance visit for lung cancer  Heme/Onc history: patient is a 67 year old female with a past medical history significant for 1-1/2 pack/day smoking for about 20 years.  She quit smoking about 26 years ago.  Other medical problems include hypertension and hyponatremia.She has been treated for iron of possible pneumonia for the last 1 year.  She has a history of intermittent asthma for which she uses Advair.  She was seen by Dr. And underwent CT chest which showed dense infiltrate/solid mass in the right lower lobe with contiguous airspace opacity in the right upper lobe.  Findings could be secondary to pneumonia but concerning for bronchogenic carcinoma.  Patient then had a PET CT scan which showed consolidation in the medial aspect of the right lower lobe measuring 3.4 x 4 cm with an SUV of 11.6.  Hypermetabolic masslike thickening in the right hilum measuring 2.2 cm with intense hypermetabolic activity.  Groundglass airspace consolidation in the posterior aspect of the right upper lobe with an SUV of 11.4.  The right upper lobe opacity was nonspecific and differentials include pulmonary infection versus lymphangitic spread of carcinoma.  CT chest showed showed masslike area of distortion involving right lower lobe middle lobe as well as right upper lobe.  Generalized right hilar  masslike appearance concerning for nodal involvement.   Patient underwent bronchoscopy and biopsies.Right lower lobe was concerning for adenocarcinoma.  Lymph node station 11 R, 4R, 7 were negative for malignancy.  No malignant cells identified in the right upper lobe.  Case was discussed at tumor board and given the hypermetabolism in the right upper lobe as well as hilum involvement cannot be ruled out despite negative biopsies.  Patient completed concurrent chemoradiation with weekly CarboTaxol in July 2022.  Scan showed partial response.  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  Cytology negative for malignancy so far    Interval history-patient was admitted to the hospital about 2 weeks ago for worsening shortness of breath.  She was found to be in A-fib with RVR and was discharged on amiodarone and Eliquis but she is not presently on Eliquis.  That was stopped by her PCP.  She also underwent right thoracentesis when she was admitted to the hospital and presently feels better with regards to her shortness of breath  ECOG PS- 1 Pain scale- 0 Opioid associated constipation- no  Review of systems- Review of Systems  Constitutional:  Positive for malaise/fatigue. Negative for chills, fever and weight loss.  HENT:  Negative for congestion, ear discharge and nosebleeds.   Eyes:  Negative for blurred vision.  Respiratory:  Negative for cough, hemoptysis, sputum production, shortness of breath and wheezing.   Cardiovascular:  Negative for chest pain, palpitations, orthopnea and claudication.  Gastrointestinal:  Negative for abdominal pain, blood in stool, constipation, diarrhea, heartburn, melena, nausea and vomiting.  Genitourinary:  Negative  for dysuria, flank pain, frequency, hematuria and urgency.  Musculoskeletal:  Negative for back pain, joint pain and myalgias.  Skin:   Negative for rash.  Neurological:  Negative for dizziness, tingling, focal weakness, seizures, weakness and headaches.  Endo/Heme/Allergies:  Does not bruise/bleed easily.  Psychiatric/Behavioral:  Negative for depression and suicidal ideas. The patient does not have insomnia.       Allergies  Allergen Reactions   Ace Inhibitors Cough     Past Medical History:  Diagnosis Date   Anxiety    Asthma    Cancer    Cough    Dyspnea    with exertion   GERD (gastroesophageal reflux disease)    Hypertension    Lung cancer      Past Surgical History:  Procedure Laterality Date   BUNIONECTOMY Bilateral    COLONOSCOPY     COLONOSCOPY WITH PROPOFOL N/A 07/30/2021   Procedure: COLONOSCOPY WITH PROPOFOL;  Surgeon: Jaynie Collins, DO;  Location: [facility] ENDOSCOPY;  Service: Gastroenterology;  Laterality: N/A;   ESOPHAGOGASTRODUODENOSCOPY  04/01/2006   FLEXIBLE BRONCHOSCOPY Right 10/13/2022   Procedure: FLEXIBLE BRONCHOSCOPY;  Surgeon: [name], MD;  Location: [facility] ORS;  Service: Pulmonary;  Laterality: Right;   PORTA CATH INSERTION N/A 12/22/2020   Procedure: PORTA CATH INSERTION;  Surgeon: Annice Needy, MD;  Location: [facility] INVASIVE CV LAB;  Service: Cardiovascular;  Laterality: N/A;   PORTA CATH REMOVAL N/A 05/31/2022   Procedure: PORTA CATH REMOVAL;  Surgeon: Annice Needy, MD;  Location: [facility] INVASIVE CV LAB;  Service: Cardiovascular;  Laterality: N/A;   THORACENTESIS     VIDEO BRONCHOSCOPY WITH ENDOBRONCHIAL NAVIGATION N/A 11/07/2020   Procedure: VIDEO BRONCHOSCOPY WITH ENDOBRONCHIAL NAVIGATION;  Surgeon: Vida Rigger, MD;  Location: [facility] ORS;  Service: Thoracic;  Laterality: N/A;   VIDEO BRONCHOSCOPY WITH ENDOBRONCHIAL ULTRASOUND N/A 11/07/2020   Procedure: VIDEO BRONCHOSCOPY WITH ENDOBRONCHIAL ULTRASOUND;  Surgeon: Vida Rigger, MD;  Location: [facility] ORS;  Service: Thoracic;  Laterality: N/A;   VIDEO BRONCHOSCOPY WITH ENDOBRONCHIAL ULTRASOUND Right 10/13/2022    Procedure: VIDEO BRONCHOSCOPY WITH ENDOBRONCHIAL ULTRASOUND;  Surgeon: [name], MD;  Location: [facility] ORS;  Service: Pulmonary;  Laterality: Right;    Social History   Socioeconomic History   Marital status: Married    Spouse name: Not on file   Number of children: Not on file   Years of education: Not on file   Highest education level: Not on file  Occupational History   Not on file  Tobacco Use   Smoking status: Former    Packs/day: 1.50    Years: 26.80    Additional pack years: 0.00    Total pack years: 40.20    Types: Cigarettes    Quit date: 11/07/1994    Years since quitting: 28.0   Smokeless tobacco: Never  Vaping Use   Vaping Use: Never used  Substance and Sexual Activity   Alcohol use: Yes    Comment:  wine daily   Drug use: Never   Sexual activity: Yes  Other Topics Concern   Not on file  Social History Narrative   Lives with husband, Zollie Beckers, no pets.   Social Determinants of Health   Financial Resource Strain: Not on file  Food Insecurity: No Food Insecurity (11/05/2022)   Hunger Vital Sign    Worried About Running Out of Food in the Last Year: Never true    Ran Out of Food in the Last Year: Never true  Transportation Needs: No Transportation Needs (11/05/2022)  PRAPARE - Administrator, Civil Service (Medical): No    Lack of Transportation (Non-Medical): No  Physical Activity: Not on file  Stress: Not on file  Social Connections: Not on file  Intimate Partner Violence: Not At Risk (11/05/2022)   Humiliation, Afraid, Rape, and Kick questionnaire    Fear of Current or Ex-Partner: No    Emotionally Abused: No    Physically Abused: No    Sexually Abused: No    Family History  Problem Relation Age of Onset   Breast cancer Neg Hx      Current Outpatient Medications:    acetaminophen (TYLENOL) 500 MG tablet, Take 500 mg by mouth every 6 (six) hours as needed., Disp: , Rfl:    ALPRAZolam (XANAX) 0.5 MG tablet, Take 0.5 mg by mouth at  bedtime as needed for sleep., Disp: , Rfl:    amiodarone (PACERONE) 200 MG tablet, Take 1 tablet (200 mg total) by mouth 2 (two) times daily for 7 days, THEN 1 tablet (200 mg total) daily for 23 days., Disp: 37 tablet, Rfl: 0   amiodarone (PACERONE) 200 MG tablet, Take by mouth., Disp: , Rfl:    apixaban (ELIQUIS) 5 MG TABS tablet, Take 1 tablet (5 mg total) by mouth 2 (two) times daily., Disp: 60 tablet, Rfl: 0   azelastine (ASTELIN) 0.1 % nasal spray, Place 2 sprays into both nostrils 2 (two) times daily. Use in each nostril as directed, Disp: 30 mL, Rfl: 2   Cholecalciferol 50 MCG (2000 UT) CAPS, Take 2,000 Units by mouth daily., Disp: , Rfl:    Cyanocobalamin (B-12 COMPLIANCE INJECTION IJ), Inject 1,000 mcg as directed. Once a month, Disp: , Rfl:    esomeprazole (NEXIUM) 20 MG capsule, Take 20 mg by mouth as needed., Disp: , Rfl:    Fluticasone-Umeclidin-Vilant (TRELEGY ELLIPTA) 100-62.5-25 MCG/ACT AEPB, Inhale 1 puff into the lungs daily as needed., Disp: , Rfl:    folic acid (FOLVITE) 1 MG tablet, TAKE 1 TABLET BY MOUTH EVERY DAY, Disp: 90 tablet, Rfl: 3   furosemide (LASIX) 20 MG tablet, Take 20 mg by mouth daily., Disp: , Rfl:    metoprolol succinate (TOPROL-XL) 50 MG 24 hr tablet, Take 1 tablet by mouth daily., Disp: , Rfl:    metoprolol tartrate (LOPRESSOR) 25 MG tablet, Take 1 tablet (25 mg total) by mouth 2 (two) times daily., Disp: 60 tablet, Rfl: 0   montelukast (SINGULAIR) 10 MG tablet, Take 1 tablet by mouth at bedtime., Disp: , Rfl:    spironolactone (ALDACTONE) 25 MG tablet, Take 25 mg by mouth daily., Disp: , Rfl:    torsemide (DEMADEX) 20 MG tablet, Take 20 mg by mouth daily., Disp: , Rfl:    zolpidem (AMBIEN) 5 MG tablet, Take 0.5 tablets by mouth at bedtime as needed., Disp: , Rfl:  No current facility-administered medications for this visit.  Facility-Administered Medications Ordered in Other Visits:    heparin lock flush 100 UNIT/ML injection, , , ,    heparin lock flush  100 UNIT/ML injection, , , ,    heparin lock flush 100 UNIT/ML injection, , , ,    heparin lock flush 100 UNIT/ML injection, , , ,    heparin lock flush 100 unit/mL, 500 Units, Intravenous, Once, Creig Hines, MD   sodium chloride flush (NS) 0.9 % injection 10 mL, 10 mL, Intravenous, Once, Creig Hines, MD   sodium chloride flush (NS) 0.9 % injection 10 mL, 10 mL, Intravenous, PRN, Owens Shark  C, MD, 10 mL at 01/26/21 0807   sodium chloride flush (NS) 0.9 % injection 10 mL, 10 mL, Intravenous, Once, Creig Hines, MD  Physical exam:  Vitals:   11/22/22 1038 11/22/22 1041  BP: (!) 148/81 139/82  Pulse: (!) 110 (!) 110  Resp: 18   Temp: (!) 95.1 F (35.1 C)   TempSrc: Tympanic   SpO2: 100%   Weight: 138 lb (62.6 kg)   Height: 5\' 5"  (1.651 m)    Physical Exam Cardiovascular:     Rate and Rhythm: Normal rate and regular rhythm.     Heart sounds: Normal heart sounds.  Pulmonary:     Effort: Pulmonary effort is normal.     Comments: Breath sounds decreased over right lung base Abdominal:     General: Bowel sounds are normal.     Palpations: Abdomen is soft.  Skin:    General: Skin is warm and dry.  Neurological:     Mental Status: She is alert and oriented to person, place, and time.         Latest Ref Rng & Units 11/22/2022   10:19 AM  CMP  Glucose 70 - 99 mg/dL 91   BUN 8 - 23 mg/dL 10   Creatinine 1.61 - 1.00 mg/dL 0.96   Sodium 045 - 409 mmol/L 129   Potassium 3.5 - 5.1 mmol/L 3.5   Chloride 98 - 111 mmol/L 89   CO2 22 - 32 mmol/L 24   Calcium 8.9 - 10.3 mg/dL 8.8   Total Protein 6.5 - 8.1 g/dL 8.2   Total Bilirubin 0.3 - 1.2 mg/dL 0.7   Alkaline Phos 38 - 126 U/L 128   AST 15 - 41 U/L 19   ALT 0 - 44 U/L 13       Latest Ref Rng & Units 11/22/2022   10:19 AM  CBC  WBC 4.0 - 10.5 K/uL 5.6   Hemoglobin 12.0 - 15.0 g/dL 81.1   Hematocrit 91.4 - 46.0 % 35.1   Platelets 150 - 400 K/uL 527     No images are attached to the encounter.  CT CHEST ABDOMEN  PELVIS W CONTRAST  Result Date: 11/17/2022 CLINICAL DATA:  Lung cancer with recurrent pleural effusion. Thoracentesis. Right lower lobe lung cancer diagnosed 2 years ago. Chemotherapy and radiation therapy. Cough and fatigue. * Tracking Code: BO * EXAM: CT CHEST, ABDOMEN, AND PELVIS WITH CONTRAST TECHNIQUE: Multidetector CT imaging of the chest, abdomen and pelvis was performed following the standard protocol during bolus administration of intravenous contrast. RADIATION DOSE REDUCTION: This exam was performed according to the departmental dose-optimization program which includes automated exposure control, adjustment of the mA and/or kV according to patient size and/or use of iterative reconstruction technique. CONTRAST:  85mL OMNIPAQUE IOHEXOL 300 MG/ML  SOLN COMPARISON:  11/04/2022 CTA chest most recent staging CT of 07/14/2022 FINDINGS: CT CHEST FINDINGS Cardiovascular: Aortic atherosclerosis. No hydronephrosis. normal heart size with small pericardial effusion, similar to slightly decreased since most recent exam. Lad coronary artery calcification. No central pulmonary embolism, on this non-dedicated study. Mediastinum/Nodes: No supraclavicular adenopathy. No axillary adenopathy. No mediastinal or hilar adenopathy. Lungs/Pleura: Since 11/04/2022, similar moderate right-sided pleural effusion. No pleural thickening or nodularity to suggest malignant effusion. Trace left pleural fluid is similar. Centrilobular and paraseptal emphysema. Right lower lobe endobronchial compression or obstruction, including on 80/3. Right lower and less so right middle lobe collapse/consolidation are similar to the CT of 11/04/2022, slightly increased from 07/14/2022. Again identified is relative  hypoattenuation or hypoenhancement within the central right lower lobe, including on 37/2. A 2 mm left lower lobe pulmonary nodule on 80/3 is not readily apparent on 07/14/2022 but is present on 11/04/2022. Musculoskeletal: No acute  osseous abnormality. CT ABDOMEN PELVIS FINDINGS Hepatobiliary: Normal liver. Normal gallbladder, without biliary ductal dilatation. Pancreas: Normal, without mass or ductal dilatation. Spleen: Normal in size, without focal abnormality. Adrenals/Urinary Tract: Normal adrenal glands. Normal kidneys, without hydronephrosis. Normal urinary bladder. Stomach/Bowel: Proximal gastric underdistention. Normal colon and terminal ileum. Normal small bowel. Vascular/Lymphatic: Aortic atherosclerosis. No abdominopelvic adenopathy. Reproductive: Normal uterus and adnexa. Other: Trace pelvic fluid is similar to 07/14/2022. No evidence of omental or peritoneal disease. No free intraperitoneal air. Musculoskeletal: Disc bulges at L4-5 and L5-S1. IMPRESSION: 1. Since 11/04/2022, similar moderate right and small left pleural effusions. 2. Persistent collapse/consolidation within the right lower and less so right middle lobes. Areas of heterogeneous hypoattenuation or hypoenhancement within the central right lower lobe, for which residual or recurrent tumor cannot be excluded. Concurrent right lower lobe endobronchial compression or obstruction. Potential clinical strategies include bronchoscopy with attention to the right lower lobe bronchus versus further evaluation with PET. 3. No acute process or evidence of metastatic disease in the abdomen or pelvis. 4. Trace pelvic fluid, similar to slightly increased from 07/14/2022. 5. Tiny left lower lobe pulmonary nodule is nonspecific but warrants follow-up attention. Electronically Signed   By: Jeronimo Greaves M.D.   On: 11/17/2022 10:59   US THORACENTESIS ASP PLEURAL SPACE W/IMG GUIDE  Result Date: 11/16/2022 INDICATION: Patient with history of right-sided lung cancer now in remission with persistent recurrent symptomatic right-sided pleural effusion. Request is for therapeutic right-sided thoracentesis. EXAM: ULTRASOUND GUIDED RIGHT-SIDED THERAPEUTIC THORACENTESIS MEDICATIONS: Lidocaine  1% 10 mL COMPLICATIONS: None immediate. PROCEDURE: An ultrasound guided thoracentesis was thoroughly discussed with the patient and questions answered. The benefits, risks, alternatives and complications were also discussed. The patient understands and wishes to proceed with the procedure. Written consent was obtained. Ultrasound was performed to localize and mark an adequate pocket of fluid in the right chest. The area was then prepped and draped in the normal sterile fashion. 1% Lidocaine was used for local anesthesia. Under ultrasound guidance a 6 Fr Safe-T-Centesis catheter was introduced. Thoracentesis was performed. The catheter was removed and a dressing applied. Patient unable to tolerate additional fluid removal at this time FINDINGS: A total of approximately 1 L of amber color fluid was removed. IMPRESSION: Successful ultrasound guided therapeutic right-sided thoracentesis yielding 1 L of pleural fluid. Read by: Anders Grant, NP Electronically Signed   By: Judie Petit.  Shick M.D.   On: 11/16/2022 10:47   DG Chest Port 1 View  Result Date: 11/16/2022 CLINICAL DATA:  Status post right thoracentesis. EXAM: PORTABLE CHEST 1 VIEW COMPARISON:  November 04, 2022. FINDINGS: Stable cardiomediastinal silhouette. Left lung is clear. Grossly stable loculated right pleural effusion is noted. No definite pneumothorax is noted. IMPRESSION: No definite pneumothorax status post right thoracentesis. Grossly stable loculated right pleural effusion. Electronically Signed   By: Lupita Raider M.D.   On: 11/16/2022 10:26   ECHOCARDIOGRAM COMPLETE  Result Date: 11/05/2022    ECHOCARDIOGRAM REPORT   Patient Name:   VERNECIA UMBLE Date of Exam: 11/05/2022 Medical Rec #:  [id number]     Height:       65.0 in Accession #:    [id number]    Weight:       143.5 lb Date of Birth:  1956-06-29     BSA:  1.718 m Patient Age:    66 years      BP:           115/62 mmHg Patient Gender: F             HR:           107 bpm. Exam Location:   [facility] Procedure: 2D Echo, Cardiac Doppler and Color Doppler Indications:     Atrial Fibrillation  History:         Patient has no prior history of Echocardiogram examinations.                  Arrythmias:Atrial Fibrillation, Atrial Flutter and Tachycardia;                  Risk Factors:Hypertension. Pericardial effusion, Pleural                  effusion, Lung CA.  Sonographer:     Mikki Harbor Referring Phys:  [id number] CADENCE H FURTH Diagnosing Phys: Debbe Odea MD IMPRESSIONS  1. Left ventricular ejection fraction, by estimation, is 55 to 60%. The left ventricle has normal function. The left ventricle has no regional wall motion abnormalities. Left ventricular diastolic parameters were normal.  2. Right ventricular systolic function is normal. The right ventricular size is normal.  3. Moderate pericardial effusion. The pericardial effusion is circumferential. There is no evidence of cardiac tamponade.  4. The mitral valve is normal in structure. Mild mitral valve regurgitation.  5. The aortic valve is tricuspid. Aortic valve regurgitation is not visualized. Aortic valve sclerosis is present, with no evidence of aortic valve stenosis.  6. The inferior vena cava is normal in size with <50% respiratory variability, suggesting right atrial pressure of 8 mmHg. FINDINGS  Left Ventricle: Left ventricular ejection fraction, by estimation, is 55 to 60%. The left ventricle has normal function. The left ventricle has no regional wall motion abnormalities. The left ventricular internal cavity size was normal in size. There is  no left ventricular hypertrophy. Left ventricular diastolic parameters were normal. Right Ventricle: The right ventricular size is normal. No increase in right ventricular wall thickness. Right ventricular systolic function is normal. Left Atrium: Left atrial size was normal in size. Right Atrium: Right atrial size was normal in size. Pericardium: A moderately sized pericardial effusion is  present. The pericardial effusion is circumferential. There is no evidence of cardiac tamponade. Mitral Valve: The mitral valve is normal in structure. Mild mitral valve regurgitation. MV peak gradient, 5.3 mmHg. The mean mitral valve gradient is 3.0 mmHg. Tricuspid Valve: The tricuspid valve is normal in structure. Tricuspid valve regurgitation is not demonstrated. Aortic Valve: The aortic valve is tricuspid. Aortic valve regurgitation is not visualized. Aortic valve sclerosis is present, with no evidence of aortic valve stenosis. Aortic valve mean gradient measures 5.0 mmHg. Aortic valve peak gradient measures 9.9  mmHg. Aortic valve area, by VTI measures 2.59 cm. Pulmonic Valve: The pulmonic valve was normal in structure. Pulmonic valve regurgitation is trivial. Aorta: The aortic root is normal in size and structure. Venous: The inferior vena cava is normal in size with less than 50% respiratory variability, suggesting right atrial pressure of 8 mmHg. IAS/Shunts: No atrial level shunt detected by color flow Doppler.  LEFT VENTRICLE PLAX 2D LVIDd:         4.10 cm   Diastology LVIDs:         2.90 cm   LV e' medial:    10.10 cm/s LV  PW:         1.00 cm   LV E/e' medial:  11.6 LV IVS:        0.90 cm   LV e' lateral:   8.49 cm/s LVOT diam:     1.90 cm   LV E/e' lateral: 13.8 LV SV:         72 LV SV Index:   42 LVOT Area:     2.84 cm  RIGHT VENTRICLE RV Basal diam:  2.85 cm RV Mid diam:    1.90 cm RV S prime:     9.95 cm/s TAPSE (M-mode): 2.1 cm LEFT ATRIUM             Index        RIGHT ATRIUM           Index LA diam:        3.60 cm 2.10 cm/m   RA Area:     16.00 cm LA Vol (A2C):   51.4 ml 29.92 ml/m  RA Volume:   39.20 ml  22.82 ml/m LA Vol (A4C):   46.9 ml 27.30 ml/m LA Biplane Vol: 51.1 ml 29.75 ml/m  AORTIC VALVE                     PULMONIC VALVE AV Area (Vmax):    2.29 cm      PV Vmax:       0.90 m/s AV Area (Vmean):   2.37 cm      PV Peak grad:  3.3 mmHg AV Area (VTI):     2.59 cm AV Vmax:            157.00 cm/s AV Vmean:          108.000 cm/s AV VTI:            0.278 m AV Peak Grad:      9.9 mmHg AV Mean Grad:      5.0 mmHg LVOT Vmax:         127.00 cm/s LVOT Vmean:        90.300 cm/s LVOT VTI:          0.254 m LVOT/AV VTI ratio: 0.91  AORTA Ao Root diam: 3.00 cm MITRAL VALVE MV Area (PHT): 5.20 cm     SHUNTS MV Area VTI:   2.76 cm     Systemic VTI:  0.25 m MV Peak grad:  5.3 mmHg     Systemic Diam: 1.90 cm MV Mean grad:  3.0 mmHg MV Vmax:       1.15 m/s MV Vmean:      76.7 cm/s MV Decel Time: 146 msec MV E velocity: 117.00 cm/s MV A velocity: 108.00 cm/s MV E/A ratio:  1.08 Debbe Odea MD Electronically signed by Debbe Odea MD Signature Date/Time: 11/05/2022/5:13:17 PM    Final    DG Chest Port 1 View  Result Date: 11/04/2022 CLINICAL DATA:  Status post right thoracentesis. EXAM: PORTABLE CHEST 1 VIEW COMPARISON:  11/04/2022. FINDINGS: 1623 hours. Decreased, now moderate right pleural effusion status post thoracentesis. No pneumothorax. Left lung is clear. Stable cardiac and mediastinal contours. Trace left pleural effusion. IMPRESSION: Decreased, now moderate right pleural effusion status post thoracentesis. No pneumothorax. Electronically Signed   By: Orvan Falconer M.D.   On: 11/04/2022 16:40   US THORACENTESIS ASP PLEURAL SPACE W/IMG GUIDE  Result Date: 11/04/2022 INDICATION: History of right-sided lung cancer now in remission with persistent recurrent symptomatic right pleural effusion. Request received  for therapeutic right thoracentesis. EXAM: ULTRASOUND GUIDED THERAPEUTIC RIGHT THORACENTESIS MEDICATIONS: 10 mL 1 % lidocaine COMPLICATIONS: None immediate. PROCEDURE: An ultrasound guided thoracentesis was thoroughly discussed with the patient and questions answered. The benefits, risks, alternatives and complications were also discussed. The patient understands and wishes to proceed with the procedure. Written consent was obtained. Ultrasound was performed to localize and mark an  adequate pocket of fluid in the right chest. The area was then prepped and draped in the normal sterile fashion. 1% Lidocaine was used for local anesthesia. Under ultrasound guidance a 6 Fr Safe-T-Centesis catheter was introduced. Thoracentesis was performed. The catheter was removed and a dressing applied. FINDINGS: A total of approximately 1.5 L of hazy, yellow fluid was removed. IMPRESSION: Successful ultrasound guided right thoracentesis yielding 1.5 L of pleural fluid. Read by: Alex Gardener, AGNP-BC Electronically Signed   By: Olive Bass M.D.   On: 11/04/2022 16:06   CT Angio Chest PE W/Cm &/Or Wo Cm  Result Date: 11/04/2022 CLINICAL DATA:  Weakness, possible pericardial effusion versus pulmonary embolism. Tachycardia. History of lung cancer with recurrent pleural effusion. EXAM: CT ANGIOGRAPHY CHEST WITH CONTRAST TECHNIQUE: Multidetector CT imaging of the chest was performed using the standard protocol during bolus administration of intravenous contrast. Multiplanar CT image reconstructions and MIPs were obtained to evaluate the vascular anatomy. RADIATION DOSE REDUCTION: This exam was performed according to the departmental dose-optimization program which includes automated exposure control, adjustment of the mA and/or kV according to patient size and/or use of iterative reconstruction technique. CONTRAST:  OMNIPAQUE IOHEXOL 350 MG/ML SOLN COMPARISON:  Same day chest radiograph, CTA chest 10/08/2022 FINDINGS: Cardiovascular: There is adequate opacification of the pulmonary arteries to the segmental level. There is no evidence of pulmonary embolism. The heart size is normal. There is a moderate pericardial effusion measuring up to 1.8 cm in thickness. The effusion measures simple fluid attenuation. The effusion is increased since 10/08/2022. The thoracic aorta is normal in caliber with scattered calcified plaque. Mediastinum/Nodes: The thyroid is unremarkable. The esophagus is grossly unremarkable.  There is no mediastinal, hilar, or axillary lymphadenopathy. Lungs/Pleura: The trachea is patent. The right lower lobe bronchus is occluded and there is narrowing of the right middle lobe bronchus. Findings are similar to the prior CT. There is a large pleural effusion with subtotal collapse of the right lung. The effusion is similar in size to the study from 10/08/2022. There is a small left pleural effusion, minimally increased since the prior study. The left lung is otherwise clear. There is no pneumothorax. Upper Abdomen: The imaged portions of the upper abdominal viscera are unremarkable. Musculoskeletal: There is no acute osseous abnormality or suspicious osseous lesion. Review of the MIP images confirms the above findings. IMPRESSION: 1. No evidence of pulmonary embolism. 2. Moderate-sized pericardial effusion is increased since the CT from 10/08/2022. 3. Large right pleural effusion with subtotal collapse of the right lung is similar to the prior study. A smaller left effusion is increased in size. Electronically Signed   By: Lesia Hausen M.D.   On: 11/04/2022 11:00   DG Chest Port 1 View  Result Date: 11/04/2022 CLINICAL DATA:  Tachycardia EXAM: PORTABLE CHEST 1 VIEW COMPARISON:  11/02/2022 FINDINGS: Large right-sided pleural effusion. Small left-sided pleural effusion. Normal pulmonary vasculature. Cardiac silhouette obscured. No pneumothorax identified. Calcified aorta. IMPRESSION: Pleural effusions, large on the right and small on the left. Electronically Signed   By: Layla Maw M.D.   On: 11/04/2022 10:29   DG Chest 2  View  Result Date: 11/03/2022 CLINICAL DATA:  Shortness of breath, fatigue EXAM: CHEST - 2 VIEW COMPARISON:  Previous studies including the examination of 10/28/2022 FINDINGS: Transverse diameter of heart is increased. There is a significant interval increase in amount of right pleural effusion extending to the upper right hilum and in the apical region. There is interval  appearance of a small left pleural effusion. There are no signs of alveolar pulmonary edema in the visualized lung fields. Evaluation of right mid and right lower lung fields for infiltrates is limited by the large effusion. There is no pneumothorax. IMPRESSION: Cardiomegaly.  There are no signs of pulmonary edema. Large right pleural effusion with interval increase in size. There is interval appearance of small left pleural effusion. Electronically Signed   By: Ernie Avena M.D.   On: 11/03/2022 20:53   US THORACENTESIS ASP PLEURAL SPACE W/IMG GUIDE  Result Date: 10/28/2022 INDICATION: History of right-sided lung cancer now in remission with persistent recurrent symptomatic right pleural effusion. Patient has known right hydropneumothorax that is chronic. Patient states she has noticed improvement in her cough with previous thoracentesis and wishes to proceed today. EXAM: ULTRASOUND GUIDED THERAPEUTIC RIGHT THORACENTESIS MEDICATIONS: 10 mL 1 % lidocaine COMPLICATIONS: None immediate. PROCEDURE: An ultrasound guided thoracentesis was thoroughly discussed with the patient and questions answered. The benefits, risks, alternatives and complications were also discussed. The patient understands and wishes to proceed with the procedure. Written consent was obtained. Ultrasound was performed to localize and mark an adequate pocket of fluid in the right chest. The area was then prepped and draped in the normal sterile fashion. 1% Lidocaine was used for local anesthesia. Under ultrasound guidance a 6 Fr Safe-T-Centesis catheter was introduced. Thoracentesis was performed. The catheter was removed and a dressing applied. FINDINGS: A total of approximately 1 L of clear, yellow fluid was removed. IMPRESSION: Successful ultrasound guided right thoracentesis yielding 1 L of pleural fluid. Read by: Alex Gardener, AGNP-BC Electronically Signed   By: Simonne Come M.D.   On: 10/28/2022 13:40   DG Chest Port 1 View  Result  Date: 10/28/2022 CLINICAL DATA:  Status post right thoracentesis. EXAM: PORTABLE CHEST 1 VIEW COMPARISON:  Radiograph 10/13/2022 and earlier; 10/08/2022 FINDINGS: Moderate layering right pleural effusion, mildly decreased compared to 10/13/2022. No pneumothorax. Persistent density at level of the right hilum, unchanged to exams. Left lung is clear. Cardiac silhouette obscured by adjacent consolidations. IMPRESSION: Moderate layering right pleural effusion, mildly decreased compared to 10/13/2022 status post. No pneumothorax. Electronically Signed   By: Sherron Ales M.D.   On: 10/28/2022 12:20     Assessment and plan- Patient is a 67 y.o. 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  She is s/p concurrent CarboTaxol chemotherapy followed by 1 year of maintenance durvalumab ending in July 2023.  She is here to discuss CT scan results and further management  I have reviewed CT chest abdomen and pelvis images andApparently and discussed findings with the patient.  She does not have any evidence of distant metastatic disease.  She has persistent right lower lobe endobronchial obstruction and collapse involving the right lower lobe.  We have done PET CT scan in the past as well and it has not given [place] more information than what we already know.  She has an upcoming appointment with Dr. Aundria Rud today.  I will defer any decisions to get a bronchoscopy evaluation to him.  She does get repeated right pleural effusions and  would like to schedule elective thoracentesis every 2 weeks and have asked her to get in touch with Dr. Aundria Rud about this as well.  For now I do not have any reason to believe that she has any progressive malignancy that would warrant restarting treatment at this time.  Follow-up with me to be decided based on plan from pulmonary  Paroxysmal A-fib: Patient is on amiodarone but not on Eliquis.She has an upcoming appointment with cardiology in May 2024 with Dr. Hilary Hertz and I  will inform him about this.  She appears to be in sinus rhythm at the time of my visit today     Visit Diagnosis 1. Encounter for follow-up surveillance of lung cancer   2. Recurrent pleural effusion on right      Dr. Owens Shark, MD, MPH [facility] [phone] 11/22/2022 11:19 AM   Addendum: Patient had bronchoscopy with biopsies in March 2024 which were negative for malignancy.  There were few atypical cells noted in the right lower lobe but not diagnostic for malignancy.  Station 11 R lymph node also negative for malignancy.

## 2022-11-22 NOTE — Progress Notes (Signed)
Patient states that she keeps getting fluid in lung that sh has questions about, she has no energy and is weak.Patient states that she has Afib and now she does not have AFIB.

## 2022-11-23 NOTE — Progress Notes (Signed)
Assessment & Plan:   #Recurrent Pleural Effusion #Stage III NSCLCa #Pneumothorax   The patient is a pleasant 67 year old female with a recurrent right-sided pleural effusion in the setting of known non-small lung cancer treated with chemoradiation who presents today for follow up. She has underwent two thoracentesis in March and April, and was admitted in April for Afib with RVR.   She developed a pneumothorax following her December thoracentesis that was associated with chest discomfort consistent with a trapped/entrapped etiology resulting in a pneumothorax ex-vacuo. The pleural fluid cytology has remained negative and chemistries are consistent with an exudate (highly elevated pleural fluid cholesterol). Pleural fluid triglycerides are normal and are inconsistent with a chylothorax.   I have reviewed her previous imaging studies and could not identify a pleural rind but this does not preclude the lung from being fully non-expandable. I suspect that she has both increased production of pleural fluid (given exudative nature, history of malignancy) as well as decreased clearance of pleural fluid (secondary to chemo-radiation and likely destruction of lymphatic drainage). PET/CT does show an area of faint avidity in the right lower lobe.   She underwent flexible bronchoscopy with EBUS/TBNA on 10/13/2022. Station 11R was sampled with TBNA and no malignant cells were noted in the lymph node. TBNA of the RLL collapse was notable for abundant benign respiratory epithelial cells on the cell block, with rare atypical cell groups on the thinprep. This is less likely to represent recurrent malignancy and likely secondary to radiation changes and collapse of the RLL.   We did discuss with the patient that her options include recurrent thoracentesis versus consideration for an indwelling pleural catheter. I am concerned that the frequency with which she is requiring thoracentesis is increasing, and if that  does occur she might require an indwelling catheter. I might coordinate this with thoracic surgery for consideration for VATS for evaluation of the pleural space to further workup the recurrent pleural effusion. I will discuss this with the patient should the rate with which the pleural fluid re-accumulates accelerates. At this moment, we will continue with regular thoracentesis and we will schedule her back in clinic in 3 weeks to evaluate the pleural space.  Finally, the patient was started on Eliquis for thrombo-prophylaxis in the setting of her newly diagnosed atrial fibrillation but forgot to take it for a week and was asked to hold it by her PCP. She is schedule to see cardiology next month. Her CHADS2VASC score is 2 (1 point removed given her anti-HTN agents were discontinued). Patient could be considered for re-initiation of thrombo-prophylaxis. Would recommend consultation with cardiology in this regard.  Return in about 24 days (around 12/16/2022).  I spent 30 minutes caring for this patient today, including preparing to see the patient, obtaining a medical history , reviewing a separately obtained history, performing a medically appropriate examination and/or evaluation, counseling and educating the patient/family/caregiver, referring and communicating with other health care professionals (not separately reported), and documenting clinical information in the electronic health record  Katherine Chute, MD  [facility] 11/23/2022 9:01 AM    End of visit medications:  No orders of the defined types were placed in this encounter.    Current Outpatient Medications:    acetaminophen (TYLENOL) 500 MG tablet, Take 500 mg by mouth every 6 (six) hours as needed., Disp: , Rfl:    ALPRAZolam (XANAX) 0.5 MG tablet, Take 0.5 mg by mouth at bedtime as needed for sleep., Disp: , Rfl:    amiodarone (  PACERONE) 200 MG tablet, Take 1 tablet (200 mg total) by mouth 2 (two) times daily for 7  days, THEN 1 tablet (200 mg total) daily for 23 days., Disp: 37 tablet, Rfl: 0   amiodarone (PACERONE) 200 MG tablet, Take by mouth., Disp: , Rfl:    azelastine (ASTELIN) 0.1 % nasal spray, Place 2 sprays into both nostrils 2 (two) times daily. Use in each nostril as directed, Disp: 30 mL, Rfl: 2   Cholecalciferol 50 MCG (2000 UT) CAPS, Take 2,000 Units by mouth daily., Disp: , Rfl:    Cyanocobalamin (B-12 COMPLIANCE INJECTION IJ), Inject 1,000 mcg as directed. Once a month, Disp: , Rfl:    esomeprazole (NEXIUM) 20 MG capsule, Take 20 mg by mouth as needed., Disp: , Rfl:    Fluticasone-Umeclidin-Vilant (TRELEGY ELLIPTA) 100-62.5-25 MCG/ACT AEPB, Inhale 1 puff into the lungs daily as needed., Disp: , Rfl:    folic acid (FOLVITE) 1 MG tablet, TAKE 1 TABLET BY MOUTH EVERY DAY, Disp: 90 tablet, Rfl: 3   furosemide (LASIX) 20 MG tablet, Take 20 mg by mouth daily., Disp: , Rfl:    metoprolol succinate (TOPROL-XL) 50 MG 24 hr tablet, Take 1 tablet by mouth daily., Disp: , Rfl:    metoprolol tartrate (LOPRESSOR) 25 MG tablet, Take 1 tablet (25 mg total) by mouth 2 (two) times daily., Disp: 60 tablet, Rfl: 0   montelukast (SINGULAIR) 10 MG tablet, Take 1 tablet by mouth at bedtime., Disp: , Rfl:    spironolactone (ALDACTONE) 25 MG tablet, Take 25 mg by mouth daily., Disp: , Rfl:    torsemide (DEMADEX) 20 MG tablet, Take 20 mg by mouth daily., Disp: , Rfl:    zolpidem (AMBIEN) 5 MG tablet, Take 0.5 tablets by mouth at bedtime as needed., Disp: , Rfl:    apixaban (ELIQUIS) 5 MG TABS tablet, Take 1 tablet (5 mg total) by mouth 2 (two) times daily. (Patient not taking: Reported on 11/22/2022), Disp: 60 tablet, Rfl: 0 No current facility-administered medications for this visit.  Facility-Administered Medications Ordered in Other Visits:    heparin lock flush 100 UNIT/ML injection, , , ,    heparin lock flush 100 UNIT/ML injection, , , ,    heparin lock flush 100 UNIT/ML injection, , , ,    heparin lock flush  100 UNIT/ML injection, , , ,    heparin lock flush 100 unit/mL, 500 Units, Intravenous, Once, Creig Hines, MD   sodium chloride flush (NS) 0.9 % injection 10 mL, 10 mL, Intravenous, Once, Creig Hines, MD   sodium chloride flush (NS) 0.9 % injection 10 mL, 10 mL, Intravenous, PRN, Creig Hines, MD, 10 mL at 01/26/21 0807   sodium chloride flush (NS) 0.9 % injection 10 mL, 10 mL, Intravenous, Once, Creig Hines, MD   Subjective:   PATIENT ID: Katherine Perry GENDER: female DOB: [birth year], MRN: [id number]  Chief Complaint  Patient presents with   Follow-up    No current sx.     HPI  Katherine Perry is a pleasant 67 year old female with a history of non-small cell lung cancer s/p chemo-radiation with a recurrent pleural effusion who is presenting to clinic for follow up. Katherine Perry has underwent flexible bronchoscopy in March of 2024 and was admitted to the hospital in April of 2024 for atrial fibrillation.   She had developed a recurrent right-sided pleural effusion status post multiple thoracentesis. The pleural fluid had been exudative but cytology remained negative.  She was  diagnosed with lung cancer in April 2022 (biopsy at that time of the right lower lobe showed non-small cell lung cancer consistent with adenocarcinoma) and received chemoradiation in coordination with oncology and radiation oncology. She underwent thoracentesis in June 2023 (650 mL), September 2023 (1 L), October 2023 (1.2 L), and December 2023 (1.5 L), January 2024 (1.2 L), February 2024 (1 L), March 2024 (two times, on the 13th and 28th), April (4th and 16th).  The pleural fluid was exudative by lights criteria (LDH and protein sent) and cytology has been negative.   She reports that her shortness of breath slowly gets worse between thoracentesis and she could feel that she is going to need it.  She mostly complains of shortness of breath at rest as well as with exertion in addition to a cough.  The improvement in the  shortness of breath is quick following the thoracentesis. She was found to have a post procedural pneumothorax following December's thoracentesis for which she required a visit to the ED and a quick admission for observation. She was admitted in April to the hospital for Afib with RVR, and was found to have a small to moderate pericardial effusion. She was started on Eliquis but was told to hold it by her PCP. She is to see cardiology next month.   Ancillary information including prior medications, full medical/surgical/family/social histories, and PFTs (when available) are listed below and have been reviewed.   Review of Systems  Constitutional:  Negative for chills and fever.  Respiratory:  Positive for cough and shortness of breath. Negative for sputum production and wheezing.   Cardiovascular:  Negative for chest pain.  Skin:  Negative for rash.     Objective:   Vitals:   11/22/22 1123  BP: 128/78  Pulse: (!) 118  Temp: 98.1 F (36.7 C)  TempSrc: Temporal  SpO2: 100%  Weight: 137 lb 3.2 oz (62.2 kg)  Height: 5\' 5"  (1.651 m)   100% on RA BMI Readings from Last 3 Encounters:  11/22/22 22.83 kg/m  11/22/22 22.96 kg/m  11/05/22 23.88 kg/m   Wt Readings from Last 3 Encounters:  11/22/22 137 lb 3.2 oz (62.2 kg)  11/22/22 138 lb (62.6 kg)  11/05/22 143 lb 8 oz (65.1 kg)    Physical Exam Constitutional:      Appearance: Normal appearance. She is not ill-appearing.  HENT:     Head: Normocephalic.     Mouth/Throat:     Mouth: Mucous membranes are moist.  Cardiovascular:     Rate and Rhythm: Normal rate and regular rhythm.     Heart sounds: Normal heart sounds.  Pulmonary:     Comments: Decreased breath sounds over the right lower lung field. Good air entry over the left. No wheezing noted. Neurological:     General: No focal deficit present.     Mental Status: She is alert and oriented to person, place, and time. Mental status is at baseline.      Ancillary  Information    Past Medical History:  Diagnosis Date   Anxiety    Asthma    Cancer    Cough    Dyspnea    with exertion   GERD (gastroesophageal reflux disease)    Hypertension    Lung cancer      Family History  Problem Relation Age of Onset   Breast cancer Neg Hx      Past Surgical History:  Procedure Laterality Date   BUNIONECTOMY Bilateral    COLONOSCOPY  COLONOSCOPY WITH PROPOFOL N/A 07/30/2021   Procedure: COLONOSCOPY WITH PROPOFOL;  Surgeon: Jaynie Collins, DO;  Location: [facility] ENDOSCOPY;  Service: Gastroenterology;  Laterality: N/A;   ESOPHAGOGASTRODUODENOSCOPY  04/01/2006   FLEXIBLE BRONCHOSCOPY Right 10/13/2022   Procedure: FLEXIBLE BRONCHOSCOPY;  Surgeon: Katherine Chute, MD;  Location: [facility] ORS;  Service: Pulmonary;  Laterality: Right;   PORTA CATH INSERTION N/A 12/22/2020   Procedure: PORTA CATH INSERTION;  Surgeon: Annice Needy, MD;  Location: [facility] INVASIVE CV LAB;  Service: Cardiovascular;  Laterality: N/A;   PORTA CATH REMOVAL N/A 05/31/2022   Procedure: PORTA CATH REMOVAL;  Surgeon: Annice Needy, MD;  Location: [facility] INVASIVE CV LAB;  Service: Cardiovascular;  Laterality: N/A;   THORACENTESIS     VIDEO BRONCHOSCOPY WITH ENDOBRONCHIAL NAVIGATION N/A 11/07/2020   Procedure: VIDEO BRONCHOSCOPY WITH ENDOBRONCHIAL NAVIGATION;  Surgeon: Vida Rigger, MD;  Location: [facility] ORS;  Service: Thoracic;  Laterality: N/A;   VIDEO BRONCHOSCOPY WITH ENDOBRONCHIAL ULTRASOUND N/A 11/07/2020   Procedure: VIDEO BRONCHOSCOPY WITH ENDOBRONCHIAL ULTRASOUND;  Surgeon: Vida Rigger, MD;  Location: [facility] ORS;  Service: Thoracic;  Laterality: N/A;   VIDEO BRONCHOSCOPY WITH ENDOBRONCHIAL ULTRASOUND Right 10/13/2022   Procedure: VIDEO BRONCHOSCOPY WITH ENDOBRONCHIAL ULTRASOUND;  Surgeon: Katherine Chute, MD;  Location: [facility] ORS;  Service: Pulmonary;  Laterality: Right;    Social History   Socioeconomic History   Marital status: Married    Spouse name: Not on file   Number  of children: Not on file   Years of education: Not on file   Highest education level: Not on file  Occupational History   Not on file  Tobacco Use   Smoking status: Former    Packs/day: 1.50    Years: 26.80    Additional pack years: 0.00    Total pack years: 40.20    Types: Cigarettes    Quit date: 11/07/1994    Years since quitting: 28.0   Smokeless tobacco: Never  Vaping Use   Vaping Use: Never used  Substance and Sexual Activity   Alcohol use: Yes    Comment:  wine daily   Drug use: Never   Sexual activity: Yes  Other Topics Concern   Not on file  Social History Narrative   Lives with husband, Zollie Beckers, no pets.   Social Determinants of Health   Financial Resource Strain: Not on file  Food Insecurity: No Food Insecurity (11/05/2022)   Hunger Vital Sign    Worried About Running Out of Food in the Last Year: Never true    Ran Out of Food in the Last Year: Never true  Transportation Needs: No Transportation Needs (11/05/2022)   PRAPARE - Administrator, Civil Service (Medical): No    Lack of Transportation (Non-Medical): No  Physical Activity: Not on file  Stress: Not on file  Social Connections: Not on file  Intimate Partner Violence: Not At Risk (11/05/2022)   Humiliation, Afraid, Rape, and Kick questionnaire    Fear of Current or Ex-Partner: No    Emotionally Abused: No    Physically Abused: No    Sexually Abused: No     Allergies  Allergen Reactions   Ace Inhibitors Cough     CBC    Component Value Date/Time   WBC 5.6 11/22/2022 1019   RBC 3.64 (L) 11/22/2022 1019   HGB 11.6 (L) 11/22/2022 1019   HCT 35.1 (L) 11/22/2022 1019   PLT 527 (H) 11/22/2022 1019   MCV 96.4 11/22/2022 1019   MCH 31.9 11/22/2022 1019  MCHC 33.0 11/22/2022 1019   RDW 12.2 11/22/2022 1019   LYMPHSABS 0.9 11/22/2022 1019   MONOABS 0.6 11/22/2022 1019   EOSABS 0.0 11/22/2022 1019   BASOSABS 0.0 11/22/2022 1019    Pulmonary Functions Testing Results:     No data to  display          Outpatient Medications Prior to Visit  Medication Sig Dispense Refill   acetaminophen (TYLENOL) 500 MG tablet Take 500 mg by mouth every 6 (six) hours as needed.     ALPRAZolam (XANAX) 0.5 MG tablet Take 0.5 mg by mouth at bedtime as needed for sleep.     amiodarone (PACERONE) 200 MG tablet Take 1 tablet (200 mg total) by mouth 2 (two) times daily for 7 days, THEN 1 tablet (200 mg total) daily for 23 days. 37 tablet 0   amiodarone (PACERONE) 200 MG tablet Take by mouth.     azelastine (ASTELIN) 0.1 % nasal spray Place 2 sprays into both nostrils 2 (two) times daily. Use in each nostril as directed 30 mL 2   Cholecalciferol 50 MCG (2000 UT) CAPS Take 2,000 Units by mouth daily.     Cyanocobalamin (B-12 COMPLIANCE INJECTION IJ) Inject 1,000 mcg as directed. Once a month     esomeprazole (NEXIUM) 20 MG capsule Take 20 mg by mouth as needed.     Fluticasone-Umeclidin-Vilant (TRELEGY ELLIPTA) 100-62.5-25 MCG/ACT AEPB Inhale 1 puff into the lungs daily as needed.     folic acid (FOLVITE) 1 MG tablet TAKE 1 TABLET BY MOUTH EVERY DAY 90 tablet 3   furosemide (LASIX) 20 MG tablet Take 20 mg by mouth daily.     metoprolol succinate (TOPROL-XL) 50 MG 24 hr tablet Take 1 tablet by mouth daily.     metoprolol tartrate (LOPRESSOR) 25 MG tablet Take 1 tablet (25 mg total) by mouth 2 (two) times daily. 60 tablet 0   montelukast (SINGULAIR) 10 MG tablet Take 1 tablet by mouth at bedtime.     spironolactone (ALDACTONE) 25 MG tablet Take 25 mg by mouth daily.     torsemide (DEMADEX) 20 MG tablet Take 20 mg by mouth daily.     zolpidem (AMBIEN) 5 MG tablet Take 0.5 tablets by mouth at bedtime as needed.     apixaban (ELIQUIS) 5 MG TABS tablet Take 1 tablet (5 mg total) by mouth 2 (two) times daily. (Patient not taking: Reported on 11/22/2022) 60 tablet 0   Facility-Administered Medications Prior to Visit  Medication Dose Route Frequency Provider Last Rate Last Admin   heparin lock flush 100  UNIT/ML injection            heparin lock flush 100 UNIT/ML injection            heparin lock flush 100 UNIT/ML injection            heparin lock flush 100 UNIT/ML injection            heparin lock flush 100 unit/mL  500 Units Intravenous Once Creig Hines, MD       sodium chloride flush (NS) 0.9 % injection 10 mL  10 mL Intravenous Once Creig Hines, MD       sodium chloride flush (NS) 0.9 % injection 10 mL  10 mL Intravenous PRN Creig Hines, MD   10 mL at 01/26/21 0807   sodium chloride flush (NS) 0.9 % injection 10 mL  10 mL Intravenous Once Creig Hines, MD

## 2022-11-26 ENCOUNTER — Telehealth: Payer: Self-pay | Admitting: *Deleted

## 2022-11-26 NOTE — Telephone Encounter (Signed)
Patient has been made aware and has already resumed the Eliquis  Per Dr. Kirke Corin, Misty Stanley,  Can you please call the patient and explain to her that our recommendation is to resume Eliquis 5 mg twice daily to decrease her chances of having a stroke related to atrial fibrillation.  Eliquis can be held 2 days before thoracentesis.  She is scheduled to see me next month.

## 2022-12-13 ENCOUNTER — Other Ambulatory Visit: Payer: Self-pay | Admitting: Internal Medicine

## 2022-12-13 DIAGNOSIS — Z1231 Encounter for screening mammogram for malignant neoplasm of breast: Secondary | ICD-10-CM

## 2022-12-16 ENCOUNTER — Ambulatory Visit: Payer: Medicare Other | Admitting: Student in an Organized Health Care Education/Training Program

## 2022-12-16 ENCOUNTER — Ambulatory Visit
Admission: RE | Admit: 2022-12-16 | Discharge: 2022-12-16 | Disposition: A | Discharge status: Home / Self Care | Payer: Medicare Other | Attending: Student in an Organized Health Care Education/Training Program | Admitting: Student in an Organized Health Care Education/Training Program

## 2022-12-16 ENCOUNTER — Ambulatory Visit
Admission: RE | Admit: 2022-12-16 | Discharge: 2022-12-16 | Disposition: A | Discharge status: Home / Self Care | Payer: Medicare Other | Source: Ambulatory Visit | Attending: Student in an Organized Health Care Education/Training Program | Admitting: Student in an Organized Health Care Education/Training Program

## 2022-12-16 ENCOUNTER — Encounter: Payer: Self-pay | Admitting: Student in an Organized Health Care Education/Training Program

## 2022-12-16 ENCOUNTER — Other Ambulatory Visit
Admission: RE | Admit: 2022-12-16 | Discharge: 2022-12-16 | Disposition: A | Discharge status: Home / Self Care | Payer: Medicare Other | Source: Ambulatory Visit | Attending: Student in an Organized Health Care Education/Training Program | Admitting: Student in an Organized Health Care Education/Training Program

## 2022-12-16 VITALS — BP 120/70 | HR 89 | Temp 97.9°F | Ht 64.0 in | Wt 138.8 lb

## 2022-12-16 DIAGNOSIS — J90 Pleural effusion, not elsewhere classified: Secondary | ICD-10-CM

## 2022-12-16 LAB — BODY FLUID CELL COUNT WITH DIFFERENTIAL
Eos, Fluid: 1 %
Lymphs, Fluid: 89 %
Monocyte-Macrophage-Serous Fluid: 2 % — ABNORMAL LOW (ref 50–90)
Neutrophil Count, Fluid: 8 % (ref 0–25)
Total Nucleated Cell Count, Fluid: 2861 cu mm — ABNORMAL HIGH (ref 0–1000)

## 2022-12-16 NOTE — Progress Notes (Signed)
Thoracentesis  Procedure Note  Katherine Perry  [id number]  Dec 26, 1955  Date:12/16/22  Time:10:46 AM   Provider Performing:Windsor Goeken   Procedure: Thoracentesis with imaging guidance (78295)  Indication(s) Pleural Effusion  Consent Risks of the procedure as well as the alternatives and risks of each were explained to the patient and/or caregiver.  Consent for the procedure was obtained and is signed in the bedside chart  Anesthesia Topical only with 1% lidocaine    Time Out Verified patient identification, verified procedure, site/side was marked, verified correct patient position, special equipment/implants available, medications/allergies/relevant history reviewed, required imaging and test results available.   Sterile Technique Maximal sterile technique including full sterile barrier drape, hand hygiene, sterile gown, sterile gloves, mask, hair covering, sterile ultrasound probe cover (if used).  Procedure Description Ultrasound was used to identify appropriate pleural anatomy for placement and overlying skin marked.  Area of drainage cleaned and draped in sterile fashion. Lidocaine was used to anesthetize the skin and subcutaneous tissue.  400 cc's of serosanguinous appearing fluid was drained from the right pleural space. The pleural pressure was measured with the fluid column method, and noted to drop quickly at the 400 mL mark. The patient also endorsed some shoulder discomfort. The procedure was halted at that point. Catheter then removed and bandaid applied to site.    Complications/Tolerance None; patient tolerated the procedure well. Chest X-ray is ordered to confirm no post-procedural complication.   EBL Minimal   Specimen(s) Pleural fluid sent for cell count and cytology  Pleural [place]  Chest Point of Care Ultrasound  Patient name: Katherine Perry MRN: [id number] DOB: 01/08/1956  Indication: Recurrent pleural Effusion  We performed ultrasound of Right  Posterolateral hemithorax,for assessment of presence, volume and sonographic appearance of pleural fluid and sonographic apperance of lung in preparation for sampling of pleural fluid/insertion of catheter into pleural space.  Relevant sonographic findings:  Large amount of pleural fluid noted on isonation. Sonographic appearance of fluid was simple appearing with septations noted on [place]. Visualized lung parenchyma was noted to be moving freely with respiration.    Impression:  Large pleural effusion that is simple in nature.  Sampling and catheter insertion.

## 2022-12-17 ENCOUNTER — Ambulatory Visit
Admission: RE | Admit: 2022-12-17 | Discharge: 2022-12-17 | Disposition: A | Discharge status: Home / Self Care | Payer: Medicare Other | Source: Ambulatory Visit | Attending: Internal Medicine | Admitting: Internal Medicine

## 2022-12-17 DIAGNOSIS — Z1231 Encounter for screening mammogram for malignant neoplasm of breast: Secondary | ICD-10-CM | POA: Insufficient documentation

## 2022-12-17 LAB — CYTOLOGY - NON PAP

## 2022-12-28 ENCOUNTER — Encounter: Payer: Self-pay | Admitting: Cardiovascular Disease

## 2022-12-28 ENCOUNTER — Ambulatory Visit: Payer: Medicare Other | Attending: Cardiovascular Disease | Admitting: Cardiovascular Disease

## 2022-12-28 VITALS — BP 128/72 | HR 106 | Ht 68.0 in | Wt 136.4 lb

## 2022-12-28 DIAGNOSIS — I3139 Other pericardial effusion (noninflammatory): Secondary | ICD-10-CM

## 2022-12-28 DIAGNOSIS — I4892 Unspecified atrial flutter: Secondary | ICD-10-CM

## 2022-12-28 MED ORDER — METOPROLOL SUCCINATE ER 50 MG PO TB24: 50.0000 mg | ORAL_TABLET | Freq: Every day | ORAL | 1 refills | Status: DC

## 2022-12-28 MED ORDER — APIXABAN 5 MG PO TABS: 5.0000 mg | ORAL_TABLET | Freq: Two times a day (BID) | ORAL | 3 refills | Status: DC

## 2022-12-28 NOTE — Patient Instructions (Addendum)
Medication Instructions:  STOP the Amiodarone STOP the Torsemide STOP the Furosemide  TAKE Metoprolol Succinate (Toprol) 50 mg once daily  *If you need a refill on your cardiac medications before your next appointment, please call your pharmacy*   Lab Work: None ordered If you have labs (blood work) drawn today and your tests are completely normal, you will receive your results only by: MyChart Message (if you have MyChart) OR A paper copy in the mail If you have any lab test that is abnormal or we need to change your treatment, we will call you to review the results.   Testing/Procedures: Your physician has requested that you have an limited echocardiogram. Echocardiography is a painless test that uses sound waves to create images of your heart. It provides your doctor with information about the size and shape of your heart and how well your heart's chambers and valves are working.   You may receive an ultrasound enhancing agent through an IV if needed to better visualize your heart during the echo. This procedure takes approximately one hour.  There are no restrictions for this procedure.  This will take place at [address] [address], [place] [address]    Follow-Up: At [facility], you and your health needs are our priority.  As part of our continuing mission to provide you with exceptional heart care, we have created designated Provider Care Teams.  These Care Teams include your primary Cardiologist (physician) and Advanced Practice Providers (APPs -  Physician Assistants and Nurse Practitioners) who all work together to provide you with the care you need, when you need it.  We recommend signing up for the patient portal called "MyChart".  Sign up information is provided on this After Visit Summary.  MyChart is used to connect with patients for Virtual Visits (Telemedicine).  Patients are able to view lab/test results, encounter notes, upcoming  appointments, etc.  Non-urgent messages can be sent to your provider as well.   To learn more about what you can do with MyChart, go to [email or web address].    Your next appointment:   3 month(s)  Provider:   You may see Lorine Bears, MD or one of the following Advanced Practice Providers on your designated Care Team:   Nicolasa Ducking, NP Eula Listen, PA-C Cadence Fransico Michael, PA-C Charlsie Quest, NP

## 2022-12-28 NOTE — Progress Notes (Signed)
Cardiology Office Note   Date:  12/28/2022   ID:  Katherine, Perry 11/26/1955, MRN [id number]  PCP:  Danella Penton, MD  Cardiologist:   Lorine Bears, MD   Chief Complaint  Patient presents with   New Patient (Initial Visit)    AFIB, Pleural effusion/HTN No complaints today. Meds reviewed verbally with pt.      History of Present Illness: Katherine Perry is a 67 y.o. female who presents for a follow-up visit regarding atrial flutter.  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  During a routine office visit with pulmonary, she was noted to be in atrial flutter with RVR.  CT scan of the chest showed large right pleural effusion with no evidence of pulmonary embolism.  In addition, there was moderate size pericardial effusion.  The patient was noted to go in and out of sinus rhythm with significant tachycardia.  She was started on amiodarone drip.  Echocardiogram showed normal LV systolic function with moderate size pericardial effusion. She was discharged on oral amiodarone but has not taken the medication since hospital discharge.  She was also given Toprol 50 mg once daily but has not been taking it.  She started taking Eliquis recently.  She reports no recurrent atrial flutter as she has been checking her heart rate.  No chest pain or worsening dyspnea.    Past Medical History:  Diagnosis Date   Anxiety    Asthma    Cancer (HCC)    Cough    Dyspnea    with exertion   GERD (gastroesophageal reflux disease)    Hypertension    Lung cancer [facility])     Past Surgical History:  Procedure Laterality Date   BUNIONECTOMY Bilateral    COLONOSCOPY     COLONOSCOPY WITH PROPOFOL N/A 07/30/2021   Procedure: COLONOSCOPY WITH PROPOFOL;  Surgeon: Jaynie Collins, DO;  Location: [facility] ENDOSCOPY;  Service: Gastroenterology;  Laterality: N/A;   ESOPHAGOGASTRODUODENOSCOPY  04/01/2006    FLEXIBLE BRONCHOSCOPY Right 10/13/2022   Procedure: FLEXIBLE BRONCHOSCOPY;  Surgeon: [name], MD;  Location: [facility] ORS;  Service: Pulmonary;  Laterality: Right;   PORTA CATH INSERTION N/A 12/22/2020   Procedure: PORTA CATH INSERTION;  Surgeon: Annice Needy, MD;  Location: [facility] INVASIVE CV LAB;  Service: Cardiovascular;  Laterality: N/A;   PORTA CATH REMOVAL N/A 05/31/2022   Procedure: PORTA CATH REMOVAL;  Surgeon: Annice Needy, MD;  Location: [facility] INVASIVE CV LAB;  Service: Cardiovascular;  Laterality: N/A;   THORACENTESIS     VIDEO BRONCHOSCOPY WITH ENDOBRONCHIAL NAVIGATION N/A 11/07/2020   Procedure: VIDEO BRONCHOSCOPY WITH ENDOBRONCHIAL NAVIGATION;  Surgeon: Vida Rigger, MD;  Location: [facility] ORS;  Service: Thoracic;  Laterality: N/A;   VIDEO BRONCHOSCOPY WITH ENDOBRONCHIAL ULTRASOUND N/A 11/07/2020   Procedure: VIDEO BRONCHOSCOPY WITH ENDOBRONCHIAL ULTRASOUND;  Surgeon: Vida Rigger, MD;  Location: [facility] ORS;  Service: Thoracic;  Laterality: N/A;   VIDEO BRONCHOSCOPY WITH ENDOBRONCHIAL ULTRASOUND Right 10/13/2022   Procedure: VIDEO BRONCHOSCOPY WITH ENDOBRONCHIAL ULTRASOUND;  Surgeon: [name], MD;  Location: [facility] ORS;  Service: Pulmonary;  Laterality: Right;     Current Outpatient Medications  Medication Sig Dispense Refill   acetaminophen (TYLENOL) 500 MG tablet Take 500 mg by mouth every 6 (six) hours as needed.     ALPRAZolam (XANAX) 0.5 MG tablet Take 0.5 mg by mouth at bedtime as needed for sleep.     apixaban (ELIQUIS)  5 MG TABS tablet Take 1 tablet (5 mg total) by mouth 2 (two) times daily. 60 tablet 0   azelastine (ASTELIN) 0.1 % nasal spray Place 2 sprays into both nostrils 2 (two) times daily. Use in each nostril as directed 30 mL 2   Cholecalciferol 50 MCG (2000 UT) CAPS Take 2,000 Units by mouth daily.     Cyanocobalamin (B-12 COMPLIANCE INJECTION IJ) Inject 1,000 mcg as directed. Once a month     Cyanocobalamin (B-12 PO) Take by mouth daily at 6 (six) AM.      esomeprazole (NEXIUM) 20 MG capsule Take 20 mg by mouth as needed.     Fluticasone-Umeclidin-Vilant (TRELEGY ELLIPTA) 100-62.5-25 MCG/ACT AEPB Inhale 1 puff into the lungs daily as needed.     folic acid (FOLVITE) 1 MG tablet TAKE 1 TABLET BY MOUTH EVERY DAY 90 tablet 3   furosemide (LASIX) 20 MG tablet Take 20 mg by mouth daily.     montelukast (SINGULAIR) 10 MG tablet Take 1 tablet by mouth at bedtime.     spironolactone (ALDACTONE) 25 MG tablet Take 25 mg by mouth daily.     torsemide (DEMADEX) 20 MG tablet Take 20 mg by mouth daily.     zolpidem (AMBIEN) 5 MG tablet Take 0.5 tablets by mouth at bedtime as needed.     amiodarone (PACERONE) 200 MG tablet Take 1 tablet (200 mg total) by mouth 2 (two) times daily for 7 days, THEN 1 tablet (200 mg total) daily for 23 days. (Patient not taking: Reported on 12/28/2022) 37 tablet 0   amiodarone (PACERONE) 200 MG tablet Take by mouth. (Patient not taking: Reported on 12/28/2022)     metoprolol succinate (TOPROL-XL) 50 MG 24 hr tablet Take 1 tablet by mouth daily. (Patient not taking: Reported on 12/28/2022)     metoprolol tartrate (LOPRESSOR) 25 MG tablet Take 1 tablet (25 mg total) by mouth 2 (two) times daily. (Patient not taking: Reported on 12/28/2022) 60 tablet 0   No current facility-administered medications for this visit.   Facility-Administered Medications Ordered in Other Visits  Medication Dose Route Frequency Provider Last Rate Last Admin   heparin lock flush 100 UNIT/ML injection            heparin lock flush 100 UNIT/ML injection            heparin lock flush 100 UNIT/ML injection            heparin lock flush 100 UNIT/ML injection            heparin lock flush 100 unit/mL  500 Units Intravenous Once Creig Hines, MD       sodium chloride flush (NS) 0.9 % injection 10 mL  10 mL Intravenous Once Creig Hines, MD       sodium chloride flush (NS) 0.9 % injection 10 mL  10 mL Intravenous PRN Creig Hines, MD   10 mL at 01/26/21 0807    sodium chloride flush (NS) 0.9 % injection 10 mL  10 mL Intravenous Once Creig Hines, MD        Allergies:   Ace inhibitors    Social History:  The patient  reports that she quit smoking about 28 years ago. Her smoking use included cigarettes. She has a 40.20 pack-year smoking history. She has never used smokeless tobacco. She reports current alcohol use. She reports that she does not use drugs.   Family History:  The patient's family history includes Heart attack in  her father; Heart disease in her father and sister.    ROS:  Please see the history of present illness.   Otherwise, review of systems are positive for none.   All other systems are reviewed and negative.    PHYSICAL EXAM: VS:  BP 128/72 (BP Location: Right Arm, Patient Position: Sitting, Cuff Size: Normal)   Pulse (!) 106   Ht 5\' 8"  (1.727 m)   Wt 136 lb 6 oz (61.9 kg)   SpO2 99%   BMI 20.74 kg/m  , BMI Body mass index is 20.74 kg/m. GEN: Well nourished, well developed, in no acute distress  HEENT: normal  Neck: no JVD, carotid bruits, or masses Cardiac: RRR; no murmurs, rubs, or gallops,no edema  Respiratory:   Diminished breath sounds at the right base consistent with pleural effusion GI: soft, nontender, nondistended, + BS MS: no deformity or atrophy  Skin: warm and dry, no rash Neuro:  Strength and sensation are intact Psych: euthymic mood, full affect   EKG:  EKG is ordered today. The ekg ordered today demonstrates sinus tachycardia with low voltage   Recent Labs: 11/02/2022: TSH 0.673 11/04/2022: Magnesium 1.5 11/22/2022: ALT 13; BUN 10; Creatinine, Ser 0.85; Hemoglobin 11.6; Platelets 527; Potassium 3.5; Sodium 129    Lipid Panel No results found for: "CHOL", "TRIG", "HDL", "CHOLHDL", "VLDL", "LDLCALC", "LDLDIRECT"    Wt Readings from Last 3 Encounters:  12/28/22 136 lb 6 oz (61.9 kg)  12/16/22 138 lb 12.8 oz (63 kg)  11/22/22 137 lb 3.2 oz (62.2 kg)          12/28/2022    1:57 PM  PAD  Screen  Previous PAD dx? No  Previous surgical procedure? No  Pain with walking? No  Feet/toe relief with dangling? No  Painful, non-healing ulcers? No  Extremities discolored? No      ASSESSMENT AND PLAN:  1.  Paroxysmal atrial flutter/fibrillation: She is in sinus rhythm today.  She has not been taking amiodarone since hospital discharge and thus we will discontinue this today.  Will only use if she has recurrent arrhythmia.  I did ask her to resume taking Toprol 50 mg once daily. Continue anticoagulation with Eliquis 5 mg twice daily.  Her CHA2DS2-VASc score is 3.  2.  Pericardial effusion: This was moderate in size.  I requested a follow-up limited echocardiogram to ensure stability.  3.  Adenocarcinoma of the right lung status post radiation and chemotherapy  4.  Recurrent right pleural effusion: She gets monthly thoracentesis.    Disposition:   FU with me in 3 months  Signed,  Lorine Bears, MD  12/28/2022 2:19 PM    [facility]

## 2023-01-03 ENCOUNTER — Ambulatory Visit: Payer: Medicare Other | Admitting: Student in an Organized Health Care Education/Training Program

## 2023-01-13 ENCOUNTER — Ambulatory Visit: Payer: Medicare Other | Admitting: Student in an Organized Health Care Education/Training Program

## 2023-01-13 ENCOUNTER — Encounter: Payer: Self-pay | Admitting: Student in an Organized Health Care Education/Training Program

## 2023-01-13 VITALS — BP 120/78 | HR 81 | Temp 97.1°F | Ht 68.0 in | Wt 139.0 lb

## 2023-01-13 DIAGNOSIS — C3431 Malignant neoplasm of lower lobe, right bronchus or lung: Secondary | ICD-10-CM | POA: Diagnosis not present

## 2023-01-13 DIAGNOSIS — J90 Pleural effusion, not elsewhere classified: Secondary | ICD-10-CM | POA: Diagnosis not present

## 2023-01-13 NOTE — Progress Notes (Signed)
Assessment & Plan:   #Recurrent Pleural Effusion #Stage III NSCLCa #Pneumothorax Ex-Vacuo   The patient is a pleasant 67 year old female with a recurrent right-sided pleural effusion in the setting of known non-small lung cancer treated with chemoradiation who presents today for follow up thoracentesis.   She developed a pneumothorax following her December thoracentesis that was associated with chest discomfort consistent with a trapped/entrapped etiology resulting in a pneumothorax ex-vacuo. She developed neck pain during our last thoracentesis again consistent with trapped lung physiology, and thoracentesis stopped after 400 mL of fluid was drained. The pleural fluid cytology has remained negative and chemistries are consistent with an exudate (highly elevated pleural fluid cholesterol). Pleural fluid triglycerides are normal and are inconsistent with a chylothorax.  Today, repeat US of the pleural space again shows a pleural effusion with a few septations that appear similar to her ultrasound last month. Given that she is on Eliquis, we will defer thoracentesis till next week.   During our pevious visits I had reviewed previous imaging and there was no pleural rind. I suspect that her lung is trapped and will not re-expand.  PET/CT did show an area of faint avidity in the right lower lobe. She underwent flexible bronchoscopy with EBUS/TBNA on 10/13/2022. Station 11R was sampled with TBNA and no malignant cells were noted in the lymph node. TBNA of the RLL collapse was notable for abundant benign respiratory epithelial cells on the cell block, with rare atypical cell groups on the thinprep. This is less likely to represent recurrent malignancy and likely secondary to radiation changes and collapse of the RLL.  Return in about 1 week (around 01/20/2023).   I spent 20 minutes caring for this patient today, including preparing to see the patient, obtaining a medical history , reviewing a  separately obtained history, performing a medically appropriate examination and/or evaluation, counseling and educating the patient/family/caregiver, documenting clinical information in the electronic health record, and independently interpreting results (not separately reported/billed) and communicating results to the patient/family/caregiver  Katherine Chute, MD [facility] 01/13/2023 3:16 PM    End of visit medications:  No orders of the defined types were placed in this encounter.    Current Outpatient Medications:    acetaminophen (TYLENOL) 500 MG tablet, Take 500 mg by mouth every 6 (six) hours as needed., Disp: , Rfl:    ALPRAZolam (XANAX) 0.5 MG tablet, Take 0.5 mg by mouth at bedtime as needed for sleep., Disp: , Rfl:    apixaban (ELIQUIS) 5 MG TABS tablet, Take 1 tablet (5 mg total) by mouth 2 (two) times daily., Disp: 180 tablet, Rfl: 3   azelastine (ASTELIN) 0.1 % nasal spray, Place 2 sprays into both nostrils 2 (two) times daily. Use in each nostril as directed, Disp: 30 mL, Rfl: 2   Cholecalciferol 50 MCG (2000 UT) CAPS, Take 2,000 Units by mouth daily., Disp: , Rfl:    Cyanocobalamin (B-12 COMPLIANCE INJECTION IJ), Inject 1,000 mcg as directed. Once a month, Disp: , Rfl:    Cyanocobalamin (B-12 PO), Take by mouth daily at 6 (six) AM., Disp: , Rfl:    esomeprazole (NEXIUM) 20 MG capsule, Take 20 mg by mouth as needed., Disp: , Rfl:    Fluticasone-Umeclidin-Vilant (TRELEGY ELLIPTA) 100-62.5-25 MCG/ACT AEPB, Inhale 1 puff into the lungs daily as needed., Disp: , Rfl:    folic acid (FOLVITE) 1 MG tablet, TAKE 1 TABLET BY MOUTH EVERY DAY, Disp: 90 tablet, Rfl: 3   metoprolol succinate (TOPROL-XL) 50 MG  24 hr tablet, Take 1 tablet (50 mg total) by mouth daily., Disp: 90 tablet, Rfl: 1   spironolactone (ALDACTONE) 25 MG tablet, Take 25 mg by mouth daily., Disp: , Rfl:    montelukast (SINGULAIR) 10 MG tablet, Take 1 tablet by mouth at bedtime., Disp: , Rfl:     zolpidem (AMBIEN) 5 MG tablet, Take 0.5 tablets by mouth at bedtime as needed., Disp: , Rfl:  No current facility-administered medications for this visit.  Facility-Administered Medications Ordered in Other Visits:    heparin lock flush 100 UNIT/ML injection, , , ,    heparin lock flush 100 UNIT/ML injection, , , ,    heparin lock flush 100 UNIT/ML injection, , , ,    heparin lock flush 100 UNIT/ML injection, , , ,    heparin lock flush 100 unit/mL, 500 Units, Intravenous, Once, Creig Hines, MD   sodium chloride flush (NS) 0.9 % injection 10 mL, 10 mL, Intravenous, Once, Creig Hines, MD   sodium chloride flush (NS) 0.9 % injection 10 mL, 10 mL, Intravenous, PRN, Creig Hines, MD, 10 mL at 01/26/21 0807   sodium chloride flush (NS) 0.9 % injection 10 mL, 10 mL, Intravenous, Once, Creig Hines, MD   Subjective:   PATIENT ID: Katherine Perry GENDER: female DOB: 1956-05-21, MRN: [id number]  Chief Complaint  Patient presents with   Follow-up    No SOB or wheezing. Coughing a little.     HPI  Katherine Perry is a pleasant 67 year old female with a history of non-small cell lung cancer s/p chemo-radiation with a recurrent pleural effusion who is presenting to clinic for follow up.   She had developed a recurrent right-sided pleural effusion status post multiple thoracentesis. Last thoracentesis was on 12/16/2022 at which point only 400 mL of serosanguinous fluid was drained secondary to neck pain concerning for trapped lung. Patient had previously had pneumothorax ex-vacuo following thoracentesis with IR.  The pleural fluid had been exudative but cytology remained negative. She was diagnosed with lung cancer in April 2022 (biopsy at that time of the right lower lobe showed non-small cell lung cancer consistent with adenocarcinoma) and received chemoradiation in coordination with oncology and radiation oncology. She underwent thoracentesis in June 2023 (650 mL), September 2023 (1 L), October 2023  (1.2 L), and December 2023 (1.5 L), January 2024 (1.2 L), February 2024 (1 L), March 2024 (two times, on the 13th and 28th), April (4th and 16th), and May 16 (400 mL).  The pleural fluid was exudative by lights criteria (LDH and protein sent) and cytology has been negative.  She was found to have a post procedural pneumothorax following December's thoracentesis for which she required a visit to the ED and a quick admission for observation. She was admitted in April to the hospital for Afib with RVR, and was found to have a small to moderate pericardial effusion. She was seen by cardiology, continued on Eliquis, and repeat echo was ordered for follow up of the pericardial effusion.   Shortness of breath and chest pressure slowly gets worse between thoracentesis. She thinks she's getting used to it, and today feels 5/10 in terms of symptoms (10 being how she felt after the last thoracentesis). Patient did present for thoracentesis today, but did take her Eliquis this week, last dose was this morning.  Ancillary information including prior medications, full medical/surgical/family/social histories, and PFTs (when available) are listed below and have been reviewed.   Review of Systems  Constitutional:  Negative for chills and fever.  Respiratory:  Negative for cough, sputum production, shortness of breath and wheezing.   Cardiovascular:  Negative for chest pain.  Skin:  Negative for rash.     Objective:   Vitals:   01/13/23 1505  BP: 120/78  Pulse: 81  Temp: (!) 97.1 F (36.2 C)  SpO2: 97%  Weight: 139 lb (63 kg)  Height: 5\' 8"  (1.727 m)   97% on RA BMI Readings from Last 3 Encounters:  01/13/23 21.13 kg/m  12/28/22 20.74 kg/m  12/16/22 23.82 kg/m   Wt Readings from Last 3 Encounters:  01/13/23 139 lb (63 kg)  12/28/22 136 lb 6 oz (61.9 kg)  12/16/22 138 lb 12.8 oz (63 kg)    Physical Exam Constitutional:      Appearance: Normal appearance. She is not ill-appearing.  HENT:      Head: Normocephalic.     Mouth/Throat:     Mouth: Mucous membranes are moist.  Cardiovascular:     Rate and Rhythm: Normal rate and regular rhythm.     Heart sounds: Normal heart sounds.  Pulmonary:     Comments: Decreased breath sounds over the right lower lung field. Good air entry over the left. No wheezing noted. Neurological:     General: No focal deficit present.     Mental Status: She is alert and oriented to person, place, and time. Mental status is at baseline.       Ancillary Information    Past Medical History:  Diagnosis Date   Anxiety    Asthma    Cancer (HCC)    Cough    Dyspnea    with exertion   GERD (gastroesophageal reflux disease)    Hypertension    Lung cancer (HCC)      Family History  Problem Relation Age of Onset   Heart attack Father    Heart disease Father    Heart disease Sister    Breast cancer Neg Hx      Past Surgical History:  Procedure Laterality Date   BUNIONECTOMY Bilateral    COLONOSCOPY     COLONOSCOPY WITH PROPOFOL N/A 07/30/2021   Procedure: COLONOSCOPY WITH PROPOFOL;  Surgeon: Jaynie Collins, DO;  Location: [facility] ENDOSCOPY;  Service: Gastroenterology;  Laterality: N/A;   ESOPHAGOGASTRODUODENOSCOPY  04/01/2006   FLEXIBLE BRONCHOSCOPY Right 10/13/2022   Procedure: FLEXIBLE BRONCHOSCOPY;  Surgeon: Katherine Chute, MD;  Location: [facility] ORS;  Service: Pulmonary;  Laterality: Right;   PORTA CATH INSERTION N/A 12/22/2020   Procedure: PORTA CATH INSERTION;  Surgeon: Annice Needy, MD;  Location: [facility] INVASIVE CV LAB;  Service: Cardiovascular;  Laterality: N/A;   PORTA CATH REMOVAL N/A 05/31/2022   Procedure: PORTA CATH REMOVAL;  Surgeon: Annice Needy, MD;  Location: [facility] INVASIVE CV LAB;  Service: Cardiovascular;  Laterality: N/A;   THORACENTESIS     VIDEO BRONCHOSCOPY WITH ENDOBRONCHIAL NAVIGATION N/A 11/07/2020   Procedure: VIDEO BRONCHOSCOPY WITH ENDOBRONCHIAL NAVIGATION;  Surgeon: Vida Rigger, MD;  Location: [facility] ORS;   Service: Thoracic;  Laterality: N/A;   VIDEO BRONCHOSCOPY WITH ENDOBRONCHIAL ULTRASOUND N/A 11/07/2020   Procedure: VIDEO BRONCHOSCOPY WITH ENDOBRONCHIAL ULTRASOUND;  Surgeon: Vida Rigger, MD;  Location: [facility] ORS;  Service: Thoracic;  Laterality: N/A;   VIDEO BRONCHOSCOPY WITH ENDOBRONCHIAL ULTRASOUND Right 10/13/2022   Procedure: VIDEO BRONCHOSCOPY WITH ENDOBRONCHIAL ULTRASOUND;  Surgeon: Katherine Chute, MD;  Location: [facility] ORS;  Service: Pulmonary;  Laterality: Right;    Social History   Socioeconomic History   Marital  status: Married    Spouse name: Not on file   Number of children: Not on file   Years of education: Not on file   Highest education level: Not on file  Occupational History   Not on file  Tobacco Use   Smoking status: Former    Packs/day: 1.50    Years: 26.80    Additional pack years: 0.00    Total pack years: 40.20    Types: Cigarettes    Quit date: 11/07/1994    Years since quitting: 28.2   Smokeless tobacco: Never  Vaping Use   Vaping Use: Never used  Substance and Sexual Activity   Alcohol use: Yes    Comment:  wine daily   Drug use: Never   Sexual activity: Yes  Other Topics Concern   Not on file  Social History Narrative   Lives with husband, Zollie Beckers, no pets.   Social Determinants of Health   Financial Resource Strain: Not on file  Food Insecurity: No Food Insecurity (11/05/2022)   Hunger Vital Sign    Worried About Running Out of Food in the Last Year: Never true    Ran Out of Food in the Last Year: Never true  Transportation Needs: No Transportation Needs (11/05/2022)   PRAPARE - Administrator, Civil Service (Medical): No    Lack of Transportation (Non-Medical): No  Physical Activity: Not on file  Stress: Not on file  Social Connections: Not on file  Intimate Partner Violence: Not At Risk (11/05/2022)   Humiliation, Afraid, Rape, and Kick questionnaire    Fear of Current or Ex-Partner: No    Emotionally Abused: No    Physically  Abused: No    Sexually Abused: No     Allergies  Allergen Reactions   Ace Inhibitors Cough     CBC    Component Value Date/Time   WBC 5.6 11/22/2022 1019   RBC 3.64 (L) 11/22/2022 1019   HGB 11.6 (L) 11/22/2022 1019   HCT 35.1 (L) 11/22/2022 1019   PLT 527 (H) 11/22/2022 1019   MCV 96.4 11/22/2022 1019   MCH 31.9 11/22/2022 1019   MCHC 33.0 11/22/2022 1019   RDW 12.2 11/22/2022 1019   LYMPHSABS 0.9 11/22/2022 1019   MONOABS 0.6 11/22/2022 1019   EOSABS 0.0 11/22/2022 1019   BASOSABS 0.0 11/22/2022 1019    Pulmonary Functions Testing Results:     No data to display          Outpatient Medications Prior to Visit  Medication Sig Dispense Refill   acetaminophen (TYLENOL) 500 MG tablet Take 500 mg by mouth every 6 (six) hours as needed.     ALPRAZolam (XANAX) 0.5 MG tablet Take 0.5 mg by mouth at bedtime as needed for sleep.     apixaban (ELIQUIS) 5 MG TABS tablet Take 1 tablet (5 mg total) by mouth 2 (two) times daily. 180 tablet 3   azelastine (ASTELIN) 0.1 % nasal spray Place 2 sprays into both nostrils 2 (two) times daily. Use in each nostril as directed 30 mL 2   Cholecalciferol 50 MCG (2000 UT) CAPS Take 2,000 Units by mouth daily.     Cyanocobalamin (B-12 COMPLIANCE INJECTION IJ) Inject 1,000 mcg as directed. Once a month     Cyanocobalamin (B-12 PO) Take by mouth daily at 6 (six) AM.     esomeprazole (NEXIUM) 20 MG capsule Take 20 mg by mouth as needed.     Fluticasone-Umeclidin-Vilant (TRELEGY ELLIPTA) 100-62.5-25 MCG/ACT AEPB Inhale  1 puff into the lungs daily as needed.     folic acid (FOLVITE) 1 MG tablet TAKE 1 TABLET BY MOUTH EVERY DAY 90 tablet 3   metoprolol succinate (TOPROL-XL) 50 MG 24 hr tablet Take 1 tablet (50 mg total) by mouth daily. 90 tablet 1   spironolactone (ALDACTONE) 25 MG tablet Take 25 mg by mouth daily.     montelukast (SINGULAIR) 10 MG tablet Take 1 tablet by mouth at bedtime.     zolpidem (AMBIEN) 5 MG tablet Take 0.5 tablets by mouth  at bedtime as needed.     Facility-Administered Medications Prior to Visit  Medication Dose Route Frequency Provider Last Rate Last Admin   heparin lock flush 100 UNIT/ML injection            heparin lock flush 100 UNIT/ML injection            heparin lock flush 100 UNIT/ML injection            heparin lock flush 100 UNIT/ML injection            heparin lock flush 100 unit/mL  500 Units Intravenous Once Creig Hines, MD       sodium chloride flush (NS) 0.9 % injection 10 mL  10 mL Intravenous Once Creig Hines, MD       sodium chloride flush (NS) 0.9 % injection 10 mL  10 mL Intravenous PRN Creig Hines, MD   10 mL at 01/26/21 0807   sodium chloride flush (NS) 0.9 % injection 10 mL  10 mL Intravenous Once Creig Hines, MD

## 2023-01-20 ENCOUNTER — Encounter: Payer: Self-pay | Admitting: Student in an Organized Health Care Education/Training Program

## 2023-01-20 ENCOUNTER — Ambulatory Visit (INDEPENDENT_AMBULATORY_CARE_PROVIDER_SITE_OTHER): Payer: Medicare Other | Admitting: Student in an Organized Health Care Education/Training Program

## 2023-01-20 ENCOUNTER — Ambulatory Visit
Admission: RE | Admit: 2023-01-20 | Discharge: 2023-01-20 | Disposition: A | Discharge status: Home / Self Care | Payer: Medicare Other | Attending: Student in an Organized Health Care Education/Training Program | Admitting: Student in an Organized Health Care Education/Training Program

## 2023-01-20 ENCOUNTER — Ambulatory Visit
Admission: RE | Admit: 2023-01-20 | Discharge: 2023-01-20 | Disposition: A | Discharge status: Home / Self Care | Payer: Medicare Other | Source: Ambulatory Visit | Attending: Student in an Organized Health Care Education/Training Program | Admitting: Student in an Organized Health Care Education/Training Program

## 2023-01-20 VITALS — BP 120/80 | HR 91 | Temp 98.0°F | Ht 65.0 in | Wt 137.0 lb

## 2023-01-20 DIAGNOSIS — J90 Pleural effusion, not elsewhere classified: Secondary | ICD-10-CM

## 2023-01-20 NOTE — Progress Notes (Signed)
Thoracentesis  Procedure Note  Katherine Perry  [id number]  July 22, 1956  Date:01/20/23  Time:12:31 PM   Provider Performing:Charlize Hathaway   Procedure: Thoracentesis with imaging guidance (40981)  Indication(s) Pleural Effusion  Consent Risks of the procedure as well as the alternatives and risks of each were explained to the patient and/or caregiver.  Consent for the procedure was obtained and is signed in the bedside chart  Anesthesia Topical only with 1% lidocaine    Time Out Verified patient identification, verified procedure, site/side was marked, verified correct patient position, special equipment/implants available, medications/allergies/relevant history reviewed, required imaging and test results available.   Sterile Technique Maximal sterile technique including full sterile barrier drape, hand hygiene, sterile gown, sterile gloves, mask, hair covering, sterile ultrasound probe cover (if used).  Procedure Description Ultrasound was used to identify appropriate pleural anatomy for placement and overlying skin marked.  Area of drainage cleaned and draped in sterile fashion. Lidocaine was used to anesthetize the skin and subcutaneous tissue.  500 cc's of yellow appearing fluid was drained from the right pleural space. Catheter then removed and bandaid applied to site.    Complications/Tolerance None; patient tolerated the procedure well. Chest X-ray is ordered to confirm no post-procedural complication.   EBL Minimal   Specimen(s) None  Katherine Chute, MD [name] 01/20/2023 12:31 PM

## 2023-01-25 ENCOUNTER — Encounter: Payer: Self-pay | Admitting: Student in an Organized Health Care Education/Training Program

## 2023-01-25 ENCOUNTER — Ambulatory Visit
Admission: RE | Admit: 2023-01-25 | Discharge: 2023-01-25 | Disposition: A | Discharge status: Home / Self Care | Payer: Medicare Other | Attending: Student in an Organized Health Care Education/Training Program | Admitting: Student in an Organized Health Care Education/Training Program

## 2023-01-25 ENCOUNTER — Ambulatory Visit
Admission: RE | Admit: 2023-01-25 | Discharge: 2023-01-25 | Disposition: A | Discharge status: Home / Self Care | Payer: Medicare Other | Source: Ambulatory Visit | Attending: Student in an Organized Health Care Education/Training Program | Admitting: Student in an Organized Health Care Education/Training Program

## 2023-01-25 DIAGNOSIS — R051 Acute cough: Secondary | ICD-10-CM | POA: Insufficient documentation

## 2023-02-07 ENCOUNTER — Ambulatory Visit: Payer: Medicare Other | Attending: Cardiovascular Disease

## 2023-02-07 DIAGNOSIS — I3139 Other pericardial effusion (noninflammatory): Secondary | ICD-10-CM | POA: Diagnosis not present

## 2023-02-07 LAB — ECHOCARDIOGRAM LIMITED
Calc EF: 60.2 %
S' Lateral: 2.3 cm
Single Plane A2C EF: 63.7 %
Single Plane A4C EF: 56.8 %

## 2023-02-24 ENCOUNTER — Ambulatory Visit: Payer: Medicare Other | Admitting: Student in an Organized Health Care Education/Training Program

## 2023-03-01 ENCOUNTER — Ambulatory Visit (INDEPENDENT_AMBULATORY_CARE_PROVIDER_SITE_OTHER): Payer: Medicare Other | Admitting: Student in an Organized Health Care Education/Training Program

## 2023-03-01 ENCOUNTER — Encounter: Payer: Self-pay | Admitting: Student in an Organized Health Care Education/Training Program

## 2023-03-01 ENCOUNTER — Ambulatory Visit
Admission: RE | Admit: 2023-03-01 | Discharge: 2023-03-01 | Disposition: A | Discharge status: Home / Self Care | Payer: Medicare Other | Source: Ambulatory Visit | Attending: Student in an Organized Health Care Education/Training Program | Admitting: Student in an Organized Health Care Education/Training Program

## 2023-03-01 VITALS — BP 130/70 | HR 90 | Temp 97.9°F | Ht 65.0 in | Wt 134.8 lb

## 2023-03-01 DIAGNOSIS — J90 Pleural effusion, not elsewhere classified: Secondary | ICD-10-CM

## 2023-03-01 NOTE — Progress Notes (Signed)
Thoracentesis  Procedure Note  SHAHARA WINGERT  [id number]  [birth year]  Date:03/01/23  Time:4:00 PM   Provider Performing:Rhea Thrun   Procedure: Thoracentesis with imaging guidance (62130)  Indication(s) Pleural Effusion  Consent Risks of the procedure as well as the alternatives and risks of each were explained to the patient and/or caregiver.  Consent for the procedure was obtained and is signed in the bedside chart  Anesthesia Topical only with 1% lidocaine    Time Out Verified patient identification, verified procedure, site/side was marked, verified correct patient position, special equipment/implants available, medications/allergies/relevant history reviewed, required imaging and test results available.   Sterile Technique Maximal sterile technique including full sterile barrier drape, hand hygiene, sterile gown, sterile gloves, mask, hair covering, sterile ultrasound probe cover (if used).  Procedure Description Ultrasound was used to identify appropriate pleural anatomy for placement and overlying skin marked.  Area of drainage cleaned and draped in sterile fashion. Lidocaine was used to anesthetize the skin and subcutaneous tissue.  400 cc's of yellow appearing fluid was drained from the right pleural space. Catheter then removed and bandaid applied to site.     Complications/Tolerance None; patient tolerated the procedure well. Chest X-ray is ordered to confirm no post-procedural complication.   EBL Minimal   Specimen(s) None    [name], MD [facility] 03/01/2023 4:00 PM

## 2023-03-17 ENCOUNTER — Ambulatory Visit (INDEPENDENT_AMBULATORY_CARE_PROVIDER_SITE_OTHER): Payer: Medicare Other

## 2023-03-17 ENCOUNTER — Ambulatory Visit: Payer: Medicare Other | Attending: Cardiovascular Disease | Admitting: Cardiovascular Disease

## 2023-03-17 ENCOUNTER — Encounter: Payer: Self-pay | Admitting: Cardiovascular Disease

## 2023-03-17 VITALS — BP 138/70 | HR 99 | Ht 65.0 in | Wt 135.1 lb

## 2023-03-17 DIAGNOSIS — I4892 Unspecified atrial flutter: Secondary | ICD-10-CM

## 2023-03-17 DIAGNOSIS — I3139 Other pericardial effusion (noninflammatory): Secondary | ICD-10-CM | POA: Diagnosis not present

## 2023-03-17 NOTE — Progress Notes (Signed)
Cardiology Office Note   Date:  03/17/2023   ID:  Katherine Perry, DOB February 18, 1956, MRN [id number]  PCP:  Danella Penton, MD  Cardiologist:   Lorine Bears, MD   Chief Complaint  Patient presents with   Follow-up    3 month f/u D/c amiodarone no complaints today. Meds reviewed verbally with pt.      History of Present Illness: Katherine Perry is a 67 y.o. female who presents for a follow-up visit regarding paroxysmal atrial flutter.  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  During a routine office visit with pulmonary in April, she was noted to be in atrial flutter with RVR.  CT scan of the chest showed large right pleural effusion with no evidence of pulmonary embolism.  In addition, there was moderate size pericardial effusion.  The patient was noted to go in and out of sinus rhythm with significant tachycardia.  She was started on amiodarone drip.  Echocardiogram showed normal LV systolic function with moderate size pericardial effusion. Amiodarone was subsequently discontinued but we continued with Toprol and Eliquis.  She had a repeat limited echocardiogram in July which showed resolution of pericardial effusion with normal ejection fraction.  She has been doing well with no chest pain or worsening dyspnea.  No palpitations.  She continues to have recurrent pleural effusion requiring thoracentesis every 6 weeks.  Past Medical History:  Diagnosis Date   Anxiety    Asthma    Cancer (HCC)    Cough    Dyspnea    with exertion   GERD (gastroesophageal reflux disease)    Hypertension    Lung cancer [facility])     Past Surgical History:  Procedure Laterality Date   BUNIONECTOMY Bilateral    COLONOSCOPY     COLONOSCOPY WITH PROPOFOL N/A 07/30/2021   Procedure: COLONOSCOPY WITH PROPOFOL;  Surgeon: Jaynie Collins, DO;  Location: [facility] ENDOSCOPY;  Service: Gastroenterology;   Laterality: N/A;   ESOPHAGOGASTRODUODENOSCOPY  04/01/2006   FLEXIBLE BRONCHOSCOPY Right 10/13/2022   Procedure: FLEXIBLE BRONCHOSCOPY;  Surgeon: [name], MD;  Location: [facility] ORS;  Service: Pulmonary;  Laterality: Right;   PORTA CATH INSERTION N/A 12/22/2020   Procedure: PORTA CATH INSERTION;  Surgeon: Annice Needy, MD;  Location: [facility] INVASIVE CV LAB;  Service: Cardiovascular;  Laterality: N/A;   PORTA CATH REMOVAL N/A 05/31/2022   Procedure: PORTA CATH REMOVAL;  Surgeon: Annice Needy, MD;  Location: [facility] INVASIVE CV LAB;  Service: Cardiovascular;  Laterality: N/A;   THORACENTESIS     VIDEO BRONCHOSCOPY WITH ENDOBRONCHIAL NAVIGATION N/A 11/07/2020   Procedure: VIDEO BRONCHOSCOPY WITH ENDOBRONCHIAL NAVIGATION;  Surgeon: Vida Rigger, MD;  Location: [facility] ORS;  Service: Thoracic;  Laterality: N/A;   VIDEO BRONCHOSCOPY WITH ENDOBRONCHIAL ULTRASOUND N/A 11/07/2020   Procedure: VIDEO BRONCHOSCOPY WITH ENDOBRONCHIAL ULTRASOUND;  Surgeon: Vida Rigger, MD;  Location: [facility] ORS;  Service: Thoracic;  Laterality: N/A;   VIDEO BRONCHOSCOPY WITH ENDOBRONCHIAL ULTRASOUND Right 10/13/2022   Procedure: VIDEO BRONCHOSCOPY WITH ENDOBRONCHIAL ULTRASOUND;  Surgeon: [name], MD;  Location: [facility] ORS;  Service: Pulmonary;  Laterality: Right;     Current Outpatient Medications  Medication Sig Dispense Refill   acetaminophen (TYLENOL) 500 MG tablet Take 500 mg by mouth every 6 (six) hours as needed.     ALPRAZolam (XANAX) 0.5 MG tablet Take 0.5 mg by mouth at bedtime as needed for sleep.     apixaban (ELIQUIS)  5 MG TABS tablet Take 1 tablet (5 mg total) by mouth 2 (two) times daily. 180 tablet 3   azelastine (ASTELIN) 0.1 % nasal spray Place 2 sprays into both nostrils 2 (two) times daily. Use in each nostril as directed 30 mL 2   Cholecalciferol 50 MCG (2000 UT) CAPS Take 2,000 Units by mouth daily.     Cyanocobalamin (B-12 COMPLIANCE INJECTION IJ) Inject 1,000 mcg as directed. Once a month      Cyanocobalamin (B-12 PO) Take by mouth daily at 6 (six) AM.     esomeprazole (NEXIUM) 20 MG capsule Take 20 mg by mouth as needed.     Fluticasone-Umeclidin-Vilant (TRELEGY ELLIPTA) 100-62.5-25 MCG/ACT AEPB Inhale 1 puff into the lungs daily as needed.     folic acid (FOLVITE) 1 MG tablet TAKE 1 TABLET BY MOUTH EVERY DAY 90 tablet 3   metoprolol succinate (TOPROL-XL) 50 MG 24 hr tablet Take 1 tablet (50 mg total) by mouth daily. 90 tablet 1   montelukast (SINGULAIR) 10 MG tablet Take 1 tablet by mouth at bedtime.     spironolactone (ALDACTONE) 25 MG tablet Take 25 mg by mouth daily.     zolpidem (AMBIEN) 5 MG tablet Take 0.5 tablets by mouth at bedtime as needed.     No current facility-administered medications for this visit.   Facility-Administered Medications Ordered in Other Visits  Medication Dose Route Frequency Provider Last Rate Last Admin   heparin lock flush 100 UNIT/ML injection            heparin lock flush 100 UNIT/ML injection            heparin lock flush 100 UNIT/ML injection            heparin lock flush 100 UNIT/ML injection            heparin lock flush 100 unit/mL  500 Units Intravenous Once Creig Hines, MD       sodium chloride flush (NS) 0.9 % injection 10 mL  10 mL Intravenous Once Creig Hines, MD       sodium chloride flush (NS) 0.9 % injection 10 mL  10 mL Intravenous PRN Creig Hines, MD   10 mL at 01/26/21 0807   sodium chloride flush (NS) 0.9 % injection 10 mL  10 mL Intravenous Once Creig Hines, MD        Allergies:   Ace inhibitors    Social History:  The patient  reports that she quit smoking about 28 years ago. Her smoking use included cigarettes. She started smoking about 55 years ago. She has a 40.2 pack-year smoking history. She has never used smokeless tobacco. She reports current alcohol use. She reports that she does not use drugs.   Family History:  The patient's family history includes Heart attack in her father; Heart disease in her  father and sister.    ROS:  Please see the history of present illness.   Otherwise, review of systems are positive for none.   All other systems are reviewed and negative.    PHYSICAL EXAM: VS:  BP 138/70 (BP Location: Left Arm, Patient Position: Sitting, Cuff Size: Normal)   Pulse 99   Ht 5\' 5"  (1.651 m)   Wt 135 lb 2 oz (61.3 kg)   SpO2 98%   BMI 22.49 kg/m  , BMI Body mass index is 22.49 kg/m. GEN: Well nourished, well developed, in no acute distress  HEENT: normal  Neck: no JVD,  carotid bruits, or masses Cardiac: RRR; no murmurs, rubs, or gallops,no edema  Respiratory:   Diminished breath sounds at the right base consistent with pleural effusion GI: soft, nontender, nondistended, + BS MS: no deformity or atrophy  Skin: warm and dry, no rash Neuro:  Strength and sensation are intact Psych: euthymic mood, full affect   EKG:  EKG is ordered today. The ekg ordered today demonstrates : Normal sinus rhythm Normal ECG When compared with ECG of 06-Nov-2022 02:53, No significant change was found    Recent Labs: 11/02/2022: TSH 0.673 11/04/2022: Magnesium 1.5 11/22/2022: ALT 13; BUN 10; Creatinine, Ser 0.85; Hemoglobin 11.6; Platelets 527; Potassium 3.5; Sodium 129    Lipid Panel No results found for: "CHOL", "TRIG", "HDL", "CHOLHDL", "VLDL", "LDLCALC", "LDLDIRECT"    Wt Readings from Last 3 Encounters:  03/17/23 135 lb 2 oz (61.3 kg)  03/01/23 134 lb 12.8 oz (61.1 kg)  01/20/23 137 lb (62.1 kg)          12/28/2022    1:57 PM  PAD Screen  Previous PAD dx? No  Previous surgical procedure? No  Pain with walking? No  Feet/toe relief with dangling? No  Painful, non-healing ulcers? No  Extremities discolored? No      ASSESSMENT AND PLAN:  1.  Paroxysmal atrial flutter/fibrillation: She is in sinus rhythm today.  Continue Toprol 50 mg daily.  Her CHA2DS2-VASc score is 3.  She is questioning the need for long-term anticoagulation especially that she has to undergo  thoracentesis every 6 weeks necessitating interruption. It is possible that her atrial arrhythmia was in the setting of pericardial effusion which has since resolved.  I am going to obtain a 2-week ZIO monitor.  If no evidence of atrial fibrillation or flutter, I think it is reasonable to discontinue Eliquis and we have her on low-dose aspirin.  2.  Pericardial effusion: This resolved on most recent echocardiogram in July.  3.  Adenocarcinoma of the right lung status post radiation and chemotherapy  4.  Recurrent right pleural effusion: She gets monthly thoracentesis.    Disposition:   FU with me in 6 months  Signed,  Lorine Bears, MD  03/17/2023 10:16 AM    [facility]

## 2023-03-17 NOTE — Patient Instructions (Signed)
Follow-Up: At [facility] you and your health needs are our priority.  As part of our continuing mission to provide you with exceptional heart care, we have created designated Provider Care Teams.  These Care Teams include your primary Cardiologist (physician) and Advanced Practice Providers (APPs -  Physician Assistants and Nurse Practitioners) who all work together to provide you with the care you need, when you need it.  We recommend signing up for the patient portal called "MyChart".  Sign up information is provided on this After Visit Summary.  MyChart is used to connect with patients for Virtual Visits (Telemedicine).  Patients are able to view lab/test results, encounter notes, upcoming appointments, etc.  Non-urgent messages can be sent to your provider as well.   To learn more about what you can do with MyChart, go to [email or web address].    Your next appointment:   6 month(s)  Provider:   You may see Lorine Bears, MD or one of the following Advanced Practice Providers on your designated Care Team:   Nicolasa Ducking, NP Eula Listen, PA-C Cadence Fransico Michael, PA-C Charlsie Quest, NP    Other Instructions Christena Deem- Long Term Monitor Instructions  Your physician has requested you wear a ZIO patch monitor for 14 days.  This is a single patch monitor. [employer] supplies one patch monitor per enrollment. Additional stickers are not available. Please do not apply patch if you will be having a Nuclear Stress Test,  Echocardiogram, Cardiac CT, MRI, or Chest Xray during the period you would be wearing the  monitor. The patch cannot be worn during these tests. You cannot remove and re-apply the  ZIO XT patch monitor.  Your ZIO patch monitor will be mailed 3 day USPS to your address on file. It may take 3-5 days  to receive your monitor after you have been enrolled.  Once you have received your monitor, please review the enclosed instructions. Your monitor  has already been registered  assigning a specific monitor serial # to you.  Billing and Patient Assistance Program Information  We have supplied [employer] with any of your insurance information on file for billing purposes. [employer] offers a sliding scale Patient Assistance Program for patients that do not have  insurance, or whose insurance does not completely cover the cost of the ZIO monitor.  You must apply for the Patient Assistance Program to qualify for this discounted rate.  To apply, please call [employer] at [phone], select option 4, select option 2, ask to apply for  Patient Assistance Program. Meredeth Ide will ask your household income, and how many people  are in your household. They will quote your out-of-pocket cost based on that information.  [employer] will also be able to set up a 67-month, interest-free payment plan if needed.  Applying the monitor   Shave hair from upper left chest.  Hold abrader disc by orange tab. Rub abrader in 40 strokes over the upper left chest as  indicated in your monitor instructions.  Clean area with 4 enclosed alcohol pads. Let dry.  Apply patch as indicated in monitor instructions. Patch will be placed under collarbone on left  side of chest with arrow pointing upward.  Rub patch adhesive wings for 2 minutes. Remove white label marked "1". Remove the white  label marked "2". Rub patch adhesive wings for 2 additional minutes.  While looking in a mirror, press and release button in center of patch. A small green light will  flash 3-4 times. This will be your  only indicator that the monitor has been turned on.  Do not shower for the first 24 hours. You may shower after the first 24 hours.  Press the button if you feel a symptom. You will hear a small click. Record Date, Time and  Symptom in the Patient Logbook.  When you are ready to remove the patch, follow instructions on the last 2 pages of Patient  Logbook. Stick patch monitor onto the last page of Patient Logbook.  Place  Patient Logbook in the blue and white box. Use locking tab on box and tape box closed  securely. The blue and white box has prepaid postage on it. Please place it in the mailbox as  soon as possible. Your physician should have your test results approximately 7 days after the  monitor has been mailed back to [facility].  Call [facility] Customer Care at [phone] if you have questions regarding  your ZIO XT patch monitor. Call them immediately if you see an orange light blinking on your  monitor.  If your monitor falls off in less than 4 days, contact our Monitor department at [phone].  If your monitor becomes loose or falls off after 4 days call [employer] at [phone] for  suggestions on securing your monitor

## 2023-03-18 ENCOUNTER — Encounter: Payer: Self-pay | Admitting: Oncology

## 2023-03-21 ENCOUNTER — Ambulatory Visit
Admission: RE | Admit: 2023-03-21 | Discharge: 2023-03-21 | Disposition: A | Discharge status: Home / Self Care | Payer: Medicare Other | Source: Ambulatory Visit | Attending: Oncology | Admitting: Oncology

## 2023-03-21 DIAGNOSIS — C3431 Malignant neoplasm of lower lobe, right bronchus or lung: Secondary | ICD-10-CM | POA: Diagnosis present

## 2023-03-21 DIAGNOSIS — I4892 Unspecified atrial flutter: Secondary | ICD-10-CM | POA: Diagnosis not present

## 2023-03-21 MED ORDER — IOHEXOL 300 MG/ML  SOLN
100.0000 mL | Freq: Once | INTRAMUSCULAR | Status: AC | PRN
  Administered 2023-03-21: 100 mL via INTRAVENOUS

## 2023-04-01 ENCOUNTER — Encounter: Payer: Self-pay | Admitting: Oncology

## 2023-04-01 ENCOUNTER — Inpatient Hospital Stay: Payer: Medicare Other | Attending: Oncology | Admitting: Oncology

## 2023-04-01 VITALS — BP 138/75 | HR 104 | Temp 98.3°F | Resp 18 | Ht 65.0 in | Wt 137.9 lb

## 2023-04-01 DIAGNOSIS — Z85118 Personal history of other malignant neoplasm of bronchus and lung: Secondary | ICD-10-CM | POA: Insufficient documentation

## 2023-04-01 DIAGNOSIS — Z87891 Personal history of nicotine dependence: Secondary | ICD-10-CM | POA: Insufficient documentation

## 2023-04-01 DIAGNOSIS — Z08 Encounter for follow-up examination after completed treatment for malignant neoplasm: Secondary | ICD-10-CM | POA: Insufficient documentation

## 2023-04-01 NOTE — Progress Notes (Signed)
Hematology/Oncology Consult note [facility]  [phone] [phone]  Patient Care Team: Danella Penton, MD as PCP - General (Internal Medicine) Iran Ouch, MD as PCP - Cardiology (Cardiology) Glory Buff, RN as Oncology Nurse Navigator [name], MD as Consulting Physician (Pulmonary Disease) Creig Hines, MD as Consulting Physician (Oncology)   Name of the patient: Katherine Perry  [id number]  [birth year]   Date of visit: 04/01/23  Diagnosis- history of stage III right lung cancer in June 2022 currently in remission   Chief complaint/ Reason for visit-routine surveillance visit for lung cancer  Heme/Onc history:  patient is a 67 year old female with a past medical history significant for 1-1/2 pack/day smoking for about 20 years.  She quit smoking about 26 years ago.  Other medical problems include hypertension and hyponatremia.She has been treated for iron of possible pneumonia for the last 1 year.  She has a history of intermittent asthma for which she uses Advair.  She was seen by Dr. And underwent CT chest which showed dense infiltrate/solid mass in the right lower lobe with contiguous airspace opacity in the right upper lobe.  Findings could be secondary to pneumonia but concerning for bronchogenic carcinoma.  Patient then had a PET CT scan which showed consolidation in the medial aspect of the right lower lobe measuring 3.4 x 4 cm with an SUV of 11.6.  Hypermetabolic masslike thickening in the right hilum measuring 2.2 cm with intense hypermetabolic activity.  Groundglass airspace consolidation in the posterior aspect of the right upper lobe with an SUV of 11.4.  The right upper lobe opacity was nonspecific and differentials include pulmonary infection versus lymphangitic spread of carcinoma.  CT chest showed showed masslike area of distortion involving right lower lobe middle lobe as well as right upper lobe.  Generalized right  hilar masslike appearance concerning for nodal involvement.   Patient underwent bronchoscopy and biopsies.Right lower lobe was concerning for adenocarcinoma.  Lymph node station 11 R, 4R, 7 were negative for malignancy.  No malignant cells identified in the right upper lobe.  Case was discussed at tumor board and given the hypermetabolism in the right upper lobe as well as hilum involvement cannot be ruled out despite negative biopsies.  Patient completed concurrent chemoradiation with weekly CarboTaxol in July 2022.  Scan showed partial response.  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  Cytology negative for malignancy so far    Interval history-patient feels well overall.  Cough and shortness of breath is improved.  She has been requiring thoracentesis roughly every 6 weeks which she gets it done through pulmonary Dr. Aundria Rud  ECOG PS- 1 Pain scale- 0   Review of systems- Review of Systems  Constitutional:  Negative for chills, fever, malaise/fatigue and weight loss.  HENT:  Negative for congestion, ear discharge and nosebleeds.   Eyes:  Negative for blurred vision.  Respiratory:  Negative for cough, hemoptysis, sputum production, shortness of breath and wheezing.   Cardiovascular:  Negative for chest pain, palpitations, orthopnea and claudication.  Gastrointestinal:  Negative for abdominal pain, blood in stool, constipation, diarrhea, heartburn, melena, nausea and vomiting.  Genitourinary:  Negative for dysuria, flank pain, frequency, hematuria and urgency.  Musculoskeletal:  Negative for back pain, joint pain and myalgias.  Skin:  Negative for rash.  Neurological:  Negative for dizziness, tingling, focal weakness, seizures, weakness and headaches.  Endo/Heme/Allergies:  Does not bruise/bleed easily.  Psychiatric/Behavioral:  Negative for depression and suicidal ideas.  The patient does not have insomnia.       Allergies  Allergen Reactions   Ace Inhibitors Cough     Past Medical History:  Diagnosis Date   Anxiety    Asthma    Cancer (HCC)    Cough    Dyspnea    with exertion   GERD (gastroesophageal reflux disease)    Hypertension    Lung cancer [facility])      Past Surgical History:  Procedure Laterality Date   BUNIONECTOMY Bilateral    COLONOSCOPY     COLONOSCOPY WITH PROPOFOL N/A 07/30/2021   Procedure: COLONOSCOPY WITH PROPOFOL;  Surgeon: Jaynie Collins, DO;  Location: [facility] ENDOSCOPY;  Service: Gastroenterology;  Laterality: N/A;   ESOPHAGOGASTRODUODENOSCOPY  04/01/2006   FLEXIBLE BRONCHOSCOPY Right 10/13/2022   Procedure: FLEXIBLE BRONCHOSCOPY;  Surgeon: [name], MD;  Location: [facility] ORS;  Service: Pulmonary;  Laterality: Right;   PORTA CATH INSERTION N/A 12/22/2020   Procedure: PORTA CATH INSERTION;  Surgeon: Annice Needy, MD;  Location: [facility] INVASIVE CV LAB;  Service: Cardiovascular;  Laterality: N/A;   PORTA CATH REMOVAL N/A 05/31/2022   Procedure: PORTA CATH REMOVAL;  Surgeon: Annice Needy, MD;  Location: [facility] INVASIVE CV LAB;  Service: Cardiovascular;  Laterality: N/A;   THORACENTESIS     VIDEO BRONCHOSCOPY WITH ENDOBRONCHIAL NAVIGATION N/A 11/07/2020   Procedure: VIDEO BRONCHOSCOPY WITH ENDOBRONCHIAL NAVIGATION;  Surgeon: Vida Rigger, MD;  Location: [facility] ORS;  Service: Thoracic;  Laterality: N/A;   VIDEO BRONCHOSCOPY WITH ENDOBRONCHIAL ULTRASOUND N/A 11/07/2020   Procedure: VIDEO BRONCHOSCOPY WITH ENDOBRONCHIAL ULTRASOUND;  Surgeon: Vida Rigger, MD;  Location: [facility] ORS;  Service: Thoracic;  Laterality: N/A;   VIDEO BRONCHOSCOPY WITH ENDOBRONCHIAL ULTRASOUND Right 10/13/2022   Procedure: VIDEO BRONCHOSCOPY WITH ENDOBRONCHIAL ULTRASOUND;  Surgeon: [name], MD;  Location: [facility] ORS;  Service: Pulmonary;  Laterality: Right;    Social History   Socioeconomic History   Marital status: Married    Spouse  name: Not on file   Number of children: Not on file   Years of education: Not on file   Highest education level: Not on file  Occupational History   Not on file  Tobacco Use   Smoking status: Former    Current packs/day: 0.00    Average packs/day: 1.5 packs/day for 26.8 years (40.2 ttl pk-yrs)    Types: Cigarettes    Start date: 01/19/1968    Quit date: 11/07/1994    Years since quitting: 28.4   Smokeless tobacco: Never  Vaping Use   Vaping status: Never Used  Substance and Sexual Activity   Alcohol use: Yes    Comment:  wine daily   Drug use: Never   Sexual activity: Yes  Other Topics Concern   Not on file  Social History Narrative   Lives with husband, Zollie Beckers, no pets.   Social Determinants of Health   Financial Resource Strain: Not on file  Food Insecurity: No Food Insecurity (11/05/2022)   Hunger Vital Sign    Worried About Running Out of Food in the Last Year: Never true    Ran Out of Food in the Last Year: Never true  Transportation Needs: No Transportation Needs (11/05/2022)   PRAPARE - Administrator, Civil Service (Medical): No    Lack of Transportation (Non-Medical): No  Physical Activity: Not on file  Stress: Not on file  Social Connections: Not on file  Intimate Partner Violence: Not At Risk (11/05/2022)   Humiliation, Afraid, Rape, and Kick questionnaire    Fear of Current or Ex-Partner: No    Emotionally Abused: No    Physically Abused: No    Sexually Abused: No    Family History  Problem Relation Age of Onset   Heart attack Father    Heart disease Father    Heart disease Sister    Breast cancer Neg Hx      Current Outpatient Medications:    acetaminophen (TYLENOL) 500 MG tablet, Take 500 mg by mouth every 6 (six) hours as needed., Disp: , Rfl:    ALPRAZolam (XANAX) 0.5 MG tablet, Take 0.5 mg by mouth at bedtime as needed for sleep., Disp: , Rfl:    apixaban (ELIQUIS) 5 MG TABS tablet, Take 1 tablet (5 mg total) by mouth 2 (two) times  daily., Disp: 180 tablet, Rfl: 3   azelastine (ASTELIN) 0.1 % nasal spray, Place 2 sprays into both nostrils 2 (two) times daily. Use in each nostril as directed, Disp: 30 mL, Rfl: 2   Cholecalciferol 50 MCG (2000 UT) CAPS, Take 2,000 Units by mouth daily., Disp: , Rfl:    Cyanocobalamin (B-12 COMPLIANCE INJECTION IJ), Inject 1,000 mcg as directed. Once a month, Disp: , Rfl:    Cyanocobalamin (B-12 PO), Take by mouth daily at 6 (six) AM., Disp: , Rfl:    esomeprazole (NEXIUM) 20 MG capsule, Take 20 mg by mouth as needed., Disp: , Rfl:    Fluticasone-Umeclidin-Vilant (TRELEGY ELLIPTA) 100-62.5-25 MCG/ACT AEPB, Inhale 1 puff into the lungs daily as needed., Disp: , Rfl:    folic acid (FOLVITE) 1 MG tablet, TAKE 1 TABLET BY MOUTH EVERY DAY, Disp: 90 tablet, Rfl: 3   metoprolol succinate (TOPROL-XL) 50 MG 24 hr tablet, Take 1 tablet (50 mg total) by mouth daily., Disp: 90 tablet, Rfl: 1   montelukast (SINGULAIR) 10 MG tablet, Take 1 tablet by mouth at bedtime., Disp: , Rfl:    zolpidem (AMBIEN) 5 MG tablet, Take 0.5 tablets by mouth at bedtime as needed., Disp: , Rfl:    spironolactone (ALDACTONE) 25 MG tablet, Take 25 mg by mouth daily. (Patient not taking: Reported on 04/01/2023), Disp: , Rfl:  No current facility-administered medications for this visit.  Facility-Administered Medications Ordered in Other Visits:    heparin lock flush 100 UNIT/ML injection, , , ,    heparin lock flush 100 UNIT/ML injection, , , ,    heparin lock flush 100 UNIT/ML injection, , , ,    heparin lock flush 100 UNIT/ML injection, , , ,    heparin lock flush 100 unit/mL, 500 Units, Intravenous, Once, Creig Hines, MD   sodium chloride flush (NS) 0.9 % injection 10 mL, 10 mL, Intravenous, Once, Creig Hines, MD   sodium chloride flush (NS) 0.9 % injection 10 mL, 10 mL, Intravenous, PRN, Creig Hines, MD, 10 mL at 01/26/21 0807   sodium chloride flush (NS) 0.9 % injection 10 mL, 10 mL, Intravenous, Once, Creig Hines, MD  Physical exam:  Vitals:   04/01/23 1314  BP: 138/75  Pulse: (!) 104  Resp: 18  Temp: 98.3 F (36.8 C)  TempSrc: Tympanic  SpO2: 100%  Weight: 137 lb 14.4 oz (62.6 kg)  Height: 5\' 5"  (1.651 m)   Physical Exam Cardiovascular:     Rate and Rhythm: Normal rate and regular rhythm.     Heart sounds: Normal heart sounds.  Pulmonary:  Effort: Pulmonary effort is normal.     Comments: Breath sounds decreased over right lung base Abdominal:     General: Bowel sounds are normal.     Palpations: Abdomen is soft.  Skin:    General: Skin is warm and dry.  Neurological:     Mental Status: She is alert and oriented to person, place, and time.         Latest Ref Rng & Units 11/22/2022   10:19 AM  CMP  Glucose 70 - 99 mg/dL 91   BUN 8 - 23 mg/dL 10   Creatinine 6.96 - 1.00 mg/dL 2.95   Sodium 284 - 132 mmol/L 129   Potassium 3.5 - 5.1 mmol/L 3.5   Chloride 98 - 111 mmol/L 89   CO2 22 - 32 mmol/L 24   Calcium 8.9 - 10.3 mg/dL 8.8   Total Protein 6.5 - 8.1 g/dL 8.2   Total Bilirubin 0.3 - 1.2 mg/dL 0.7   Alkaline Phos 38 - 126 U/L 128   AST 15 - 41 U/L 19   ALT 0 - 44 U/L 13       Latest Ref Rng & Units 11/22/2022   10:19 AM  CBC  WBC 4.0 - 10.5 K/uL 5.6   Hemoglobin 12.0 - 15.0 g/dL 44.0   Hematocrit 10.2 - 46.0 % 35.1   Platelets 150 - 400 K/uL 527     No images are attached to the encounter.  CT CHEST ABDOMEN PELVIS W CONTRAST  Result Date: 03/21/2023 CLINICAL DATA:  History of lung cancer with recurrent pleural effusion. Follow-up * Tracking Code: BO * EXAM: CT CHEST, ABDOMEN, AND PELVIS WITH CONTRAST TECHNIQUE: Multidetector CT imaging of the chest, abdomen and pelvis was performed following the standard protocol during bolus administration of intravenous contrast. RADIATION DOSE REDUCTION: This exam was performed according to the departmental dose-optimization program which includes automated exposure control, adjustment of the mA and/or kV according to  patient size and/or use of iterative reconstruction technique. CONTRAST:  OMNIPAQUE IOHEXOL 300 MG/ML  SOLN COMPARISON:  Multiple priors including most recent CT November 16, 2022 FINDINGS: CT CHEST FINDINGS Cardiovascular: Aortic atherosclerosis. No central pulmonary embolus on this nondedicated study. Normal size heart. Decreased size of the now trace pericardial effusion. Mediastinum/Nodes: No suspicious thyroid nodule. Similar posttreatment rightward deviation of the mediastinum. No pathologically enlarged mediastinal, hilar or axillary lymph nodes stable prominent mediastinal lymph nodes for instance a pretracheal lymph node measuring 6 mm in short axis on image 14/2 is unchanged. Diffuse symmetric esophageal wall thickening Lungs/Pleura: Decreased size of the moderate partially loculated right pleural effusion. No discrete pleural thickening or nodularity to suggest malignant effusion. Centrilobular and paraseptal emphysema. Decreased right lower lobe endobronchial compression now with a thin patent segment of right lower lobe bronchus extending into segmental branches for instance on image 70/3. Persistent right lower/middle lobe collapse again with heterogeneous enhancement of the lung in this area with relative hypo attenuation/enhancement within the central right lower lobe for instance on image 30/2 Increased size of the left lower lobe pulmonary nodule now measuring 4 mm on image 61/3 previously 2 mm. No new suspicious pulmonary nodules or masses. Musculoskeletal: No aggressive lytic or blastic lesion of bone. CT ABDOMEN PELVIS FINDINGS Hepatobiliary: No suspicious hepatic lesion. Gallbladder is unremarkable. No biliary ductal dilation. Pancreas: No pancreatic ductal dilation or evidence of acute inflammation. Spleen: No splenomegaly Adrenals/Urinary Tract: Bilateral adrenal glands appear normal. No hydronephrosis. No suspicious renal mass. Urinary bladder is unremarkable for  degree of distension.  Stomach/Bowel: Stomach is unremarkable for degree of distension. No pathologic dilation of small or large bowel. No evidence of acute bowel inflammation. Vascular/Lymphatic: Normal caliber abdominal aorta. Aortic atherosclerosis. Smooth IVC contours. The portal, splenic and superior mesenteric veins are patent. No pathologically enlarged abdominal or pelvic lymph nodes. Reproductive: Uterus and bilateral adnexa are unremarkable. Other: Similar trace pelvic free fluid. No discrete peritoneal or omental nodularity. Musculoskeletal: No aggressive lytic or blastic lesion of bone. avascular necrosis of the femoral heads. Multilevel degenerative changes spine. IMPRESSION: 1. Decreased size of the moderate partially loculated right pleural effusion. No discrete pleural thickening or nodularity to suggest malignant effusion. 2. Decreased right lower lobe endobronchial compression now with a thin patent segment of right lower lobe bronchus extending into segmental branches. Persistent collapse/consolidation within the right lower and middle lobes with heterogeneous enhancement of the central right lower lobe similar prior a nonspecific finding possibly reflecting a combination of atelectatic and hypoperfused lung however local recurrence could not be excluded on this imaging. Consider continued attention on follow-up imaging versus more potentially more definitive assessment by nuclear medicine PET-CT 3. Increased size of the left lower lobe pulmonary nodule now measuring 4 mm previously 2 mm, indeterminate. Attention on follow-up imaging suggested. 4. Decreased size of the now trace pericardial effusion. 5. No evidence of metastatic disease within the abdomen or pelvis. 6. Diffuse symmetric esophageal wall thickening, correlate for esophagitis. 7. Aortic Atherosclerosis (ICD10-I70.0) and Emphysema (ICD10-J43.9). Electronically Signed   By: Maudry Mayhew M.D.   On: 03/21/2023 10:33     Assessment and plan- Patient is a  67 y.o. 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  She is s/p concurrent CarboTaxol chemotherapy followed by 1 year of maintenance durvalumab ending in July 2023.  She is here for a routine follow-up   I have reviewed CT chest abdomen pelvis images independently and discussed findings with the patient which shows stable postradiation changes and chronic collapse/consolidation involving the right middle and lower lobes.  No evidence of progressive disease and she has had PET scan in the past as well.  There is a new left lower lobe nodule that was noted which had grown from 2 mm to 4 mm.  I will plan to get a repeat CT chest without contrast in 4 months and see her thereafter   Visit Diagnosis 1. Encounter for follow-up surveillance of lung cancer      Dr. Owens Shark, MD, MPH [facility] [id number] 04/01/2023 3:11 PM

## 2023-04-12 ENCOUNTER — Encounter: Payer: Self-pay | Admitting: Student in an Organized Health Care Education/Training Program

## 2023-04-12 ENCOUNTER — Ambulatory Visit (INDEPENDENT_AMBULATORY_CARE_PROVIDER_SITE_OTHER): Payer: Medicare Other | Admitting: Student in an Organized Health Care Education/Training Program

## 2023-04-12 VITALS — BP 142/80 | HR 103 | Temp 97.6°F | Ht 65.0 in | Wt 138.4 lb

## 2023-04-12 DIAGNOSIS — C3431 Malignant neoplasm of lower lobe, right bronchus or lung: Secondary | ICD-10-CM | POA: Diagnosis not present

## 2023-04-12 DIAGNOSIS — J90 Pleural effusion, not elsewhere classified: Secondary | ICD-10-CM

## 2023-04-12 DIAGNOSIS — Z23 Encounter for immunization: Secondary | ICD-10-CM

## 2023-04-12 NOTE — Progress Notes (Signed)
Assessment & Plan:   #Recurrent Pleural Effusion #Stage III NSCLCa #Pneumothorax Ex-Vacuo   The patient is a pleasant 67 year old female with a recurrent right-sided pleural effusion in the setting of known non-small lung cancer treated with chemoradiation who presents today for follow up.  She developed a pneumothorax following her December 2023 thoracentesis that was associated with chest discomfort consistent with a trapped/entrapped etiology resulting in a pneumothorax ex-vacuo. She developed neck pain during our last thoracentesis again consistent with trapped lung physiology, and thoracentesis stopped after 400 mL of fluid was drained. The pleural fluid cytology has remained negative and chemistries are consistent with an exudate (highly elevated pleural fluid cholesterol). Pleural fluid triglycerides are normal and are inconsistent with a chylothorax.  Patient's rate of re-accumulation has slowed between thoracentesis, and we are almost two months since our last thoracentesis without significant worsening of symptoms. I will hold off on performing a thoracentesis today, and will re-evaluate the patient at the two month mark. Should there be continued symptom stability, we will hold off on procedural intervention. Should symptoms worsen or recur, will proceed with thoracentesis.   Today, repeat US of the pleural space again shows a pleural effusion with a few septations that appear similar to her ultrasound last month. Given that she is on Eliquis, we will defer thoracentesis till next week.    Return in about 3 weeks (around 05/03/2023).  I spent 25 minutes caring for this patient today, including preparing to see the patient, obtaining a medical history , reviewing a separately obtained history, performing a medically appropriate examination and/or evaluation, counseling and educating the patient/family/caregiver, and documenting clinical information in the electronic health  record  Katherine Chute, MD [facility] 04/12/2023 2:42 PM    End of visit medications:  No orders of the defined types were placed in this encounter.    Current Outpatient Medications:    acetaminophen (TYLENOL) 500 MG tablet, Take 500 mg by mouth every 6 (six) hours as needed., Disp: , Rfl:    ALPRAZolam (XANAX) 0.5 MG tablet, Take 0.5 mg by mouth at bedtime as needed for sleep., Disp: , Rfl:    apixaban (ELIQUIS) 5 MG TABS tablet, Take 1 tablet (5 mg total) by mouth 2 (two) times daily., Disp: 180 tablet, Rfl: 3   azelastine (ASTELIN) 0.1 % nasal spray, Place 2 sprays into both nostrils 2 (two) times daily. Use in each nostril as directed, Disp: 30 mL, Rfl: 2   Cholecalciferol 50 MCG (2000 UT) CAPS, Take 2,000 Units by mouth daily., Disp: , Rfl:    Cyanocobalamin (B-12 COMPLIANCE INJECTION IJ), Inject 1,000 mcg as directed. Once a month, Disp: , Rfl:    Cyanocobalamin (B-12 PO), Take by mouth daily at 6 (six) AM., Disp: , Rfl:    esomeprazole (NEXIUM) 20 MG capsule, Take 20 mg by mouth as needed., Disp: , Rfl:    Fluticasone-Umeclidin-Vilant (TRELEGY ELLIPTA) 100-62.5-25 MCG/ACT AEPB, Inhale 1 puff into the lungs daily as needed., Disp: , Rfl:    folic acid (FOLVITE) 1 MG tablet, TAKE 1 TABLET BY MOUTH EVERY DAY, Disp: 90 tablet, Rfl: 3   metoprolol succinate (TOPROL-XL) 50 MG 24 hr tablet, Take 1 tablet (50 mg total) by mouth daily., Disp: 90 tablet, Rfl: 1   montelukast (SINGULAIR) 10 MG tablet, Take 1 tablet by mouth at bedtime., Disp: , Rfl:    spironolactone (ALDACTONE) 25 MG tablet, Take 25 mg by mouth daily., Disp: , Rfl:    zolpidem (AMBIEN) 5  MG tablet, Take 0.5 tablets by mouth at bedtime as needed., Disp: , Rfl:  No current facility-administered medications for this visit.  Facility-Administered Medications Ordered in Other Visits:    heparin lock flush 100 UNIT/ML injection, , , ,    heparin lock flush 100 UNIT/ML injection, , , ,    heparin lock flush  100 UNIT/ML injection, , , ,    heparin lock flush 100 UNIT/ML injection, , , ,    heparin lock flush 100 unit/mL, 500 Units, Intravenous, Once, Creig Hines, MD   sodium chloride flush (NS) 0.9 % injection 10 mL, 10 mL, Intravenous, Once, Creig Hines, MD   sodium chloride flush (NS) 0.9 % injection 10 mL, 10 mL, Intravenous, PRN, Creig Hines, MD, 10 mL at 01/26/21 0807   sodium chloride flush (NS) 0.9 % injection 10 mL, 10 mL, Intravenous, Once, Creig Hines, MD   Subjective:   PATIENT ID: Katherine Perry GENDER: female DOB: 1956/02/28, MRN: [id number]  Chief Complaint  Patient presents with   Follow-up    HPI  Katherine Perry is a pleasant 68 year old female with a history of non-small cell lung cancer s/p chemo-radiation with a recurrent pleural effusion who is presenting to clinic for follow up.   She had developed a recurrent right-sided pleural effusion status post multiple thoracentesis. Last thoracentesis was on 03/01/2023 at which point only 400 mL of fluid was drained secondary to neck pain concerning for trapped lung. Patient had previously had pneumothorax ex-vacuo following thoracentesis with IR.  Interval history notes symptom stability. She has no increased shortness of breath or chest tightness, usually a sign of re-accumulating fluid for Katherine Perry. She continues to be on Apixaban. She has retired and has been enjoying retirement.   The pleural fluid had been exudative but cytology remained negative. She was diagnosed with lung cancer in April 2022 (biopsy at that time of the right lower lobe showed non-small cell lung cancer consistent with adenocarcinoma) and received chemoradiation in coordination with oncology and radiation oncology. She underwent thoracentesis in June 2023 (650 mL), September 2023 (1 L), October 2023 (1.2 L), and December 2023 (1.5 L), January 2024 (1.2 L), February 2024 (1 L), March 2024 (two times, on the 13th and 28th), April (4th and 16th), and May 16  (400 mL), January 20, 2023 (500 mL), and March 01, 2023 (400 mL).  The pleural fluid was exudative by lights criteria (LDH and protein sent) and cytology has been negative.   She was found to have a post procedural pneumothorax following December 2023's thoracentesis for which she required a visit to the ED and a quick admission for observation. She was admitted in April to the hospital for Afib with RVR, and was found to have a small to moderate pericardial effusion. She was seen by cardiology, continued on Eliquis, and repeat echo was ordered for follow up of the pericardial effusion.  Ancillary information including prior medications, full medical/surgical/family/social histories, and PFTs (when available) are listed below and have been reviewed.   Review of Systems  Constitutional:  Negative for chills and fever.  Respiratory:  Negative for cough, sputum production, shortness of breath and wheezing.   Cardiovascular:  Negative for chest pain.  Skin:  Negative for rash.     Objective:   Vitals:   04/12/23 1435  BP: (!) 142/80  Pulse: (!) 103  Temp: 97.6 F (36.4 C)  TempSrc: Temporal  SpO2: 97%  Weight: 138 lb 6.4  oz (62.8 kg)  Height: 5\' 5"  (1.651 m)   97% on  RA BMI Readings from Last 3 Encounters:  04/12/23 23.03 kg/m  04/01/23 22.95 kg/m  03/17/23 22.49 kg/m   Wt Readings from Last 3 Encounters:  04/12/23 138 lb 6.4 oz (62.8 kg)  04/01/23 137 lb 14.4 oz (62.6 kg)  03/17/23 135 lb 2 oz (61.3 kg)    Physical Exam Constitutional:      Appearance: Normal appearance. She is not ill-appearing.  HENT:     Head: Normocephalic.     Mouth/Throat:     Mouth: Mucous membranes are moist.  Cardiovascular:     Rate and Rhythm: Normal rate and regular rhythm.     Heart sounds: Normal heart sounds.  Pulmonary:     Comments: Decreased breath sounds over the right lower lung field. Good air entry over the left. No wheezing noted. Neurological:     General: No focal deficit  present.     Mental Status: She is alert and oriented to person, place, and time. Mental status is at baseline.       Ancillary Information    Past Medical History:  Diagnosis Date   Anxiety    Asthma    Cancer (HCC)    Cough    Dyspnea    with exertion   GERD (gastroesophageal reflux disease)    Hypertension    Lung cancer (HCC)      Family History  Problem Relation Age of Onset   Heart attack Father    Heart disease Father    Heart disease Sister    Breast cancer Neg Hx      Past Surgical History:  Procedure Laterality Date   BUNIONECTOMY Bilateral    COLONOSCOPY     COLONOSCOPY WITH PROPOFOL N/A 07/30/2021   Procedure: COLONOSCOPY WITH PROPOFOL;  Surgeon: Jaynie Collins, DO;  Location: [facility] ENDOSCOPY;  Service: Gastroenterology;  Laterality: N/A;   ESOPHAGOGASTRODUODENOSCOPY  04/01/2006   FLEXIBLE BRONCHOSCOPY Right 10/13/2022   Procedure: FLEXIBLE BRONCHOSCOPY;  Surgeon: Katherine Chute, MD;  Location: [facility] ORS;  Service: Pulmonary;  Laterality: Right;   PORTA CATH INSERTION N/A 12/22/2020   Procedure: PORTA CATH INSERTION;  Surgeon: Annice Needy, MD;  Location: [facility] INVASIVE CV LAB;  Service: Cardiovascular;  Laterality: N/A;   PORTA CATH REMOVAL N/A 05/31/2022   Procedure: PORTA CATH REMOVAL;  Surgeon: Annice Needy, MD;  Location: [facility] INVASIVE CV LAB;  Service: Cardiovascular;  Laterality: N/A;   THORACENTESIS     VIDEO BRONCHOSCOPY WITH ENDOBRONCHIAL NAVIGATION N/A 11/07/2020   Procedure: VIDEO BRONCHOSCOPY WITH ENDOBRONCHIAL NAVIGATION;  Surgeon: Vida Rigger, MD;  Location: [facility] ORS;  Service: Thoracic;  Laterality: N/A;   VIDEO BRONCHOSCOPY WITH ENDOBRONCHIAL ULTRASOUND N/A 11/07/2020   Procedure: VIDEO BRONCHOSCOPY WITH ENDOBRONCHIAL ULTRASOUND;  Surgeon: Vida Rigger, MD;  Location: [facility] ORS;  Service: Thoracic;  Laterality: N/A;   VIDEO BRONCHOSCOPY WITH ENDOBRONCHIAL ULTRASOUND Right 10/13/2022   Procedure: VIDEO BRONCHOSCOPY WITH  ENDOBRONCHIAL ULTRASOUND;  Surgeon: Katherine Chute, MD;  Location: [facility] ORS;  Service: Pulmonary;  Laterality: Right;    Social History   Socioeconomic History   Marital status: Married    Spouse name: Not on file   Number of children: Not on file   Years of education: Not on file   Highest education level: Not on file  Occupational History   Not on file  Tobacco Use   Smoking status: Former    Current packs/day: 0.00    Average packs/day: 1.5  packs/day for 26.8 years (40.2 ttl pk-yrs)    Types: Cigarettes    Start date: 01/19/1968    Quit date: 11/07/1994    Years since quitting: 28.4   Smokeless tobacco: Never  Vaping Use   Vaping status: Never Used  Substance and Sexual Activity   Alcohol use: Yes    Comment:  wine daily   Drug use: Never   Sexual activity: Yes  Other Topics Concern   Not on file  Social History Narrative   Lives with husband, Zollie Beckers, no pets.   Social Determinants of Health   Financial Resource Strain: Not on file  Food Insecurity: No Food Insecurity (11/05/2022)   Hunger Vital Sign    Worried About Running Out of Food in the Last Year: Never true    Ran Out of Food in the Last Year: Never true  Transportation Needs: No Transportation Needs (11/05/2022)   PRAPARE - Administrator, Civil Service (Medical): No    Lack of Transportation (Non-Medical): No  Physical Activity: Not on file  Stress: Not on file  Social Connections: Not on file  Intimate Partner Violence: Not At Risk (11/05/2022)   Humiliation, Afraid, Rape, and Kick questionnaire    Fear of Current or Ex-Partner: No    Emotionally Abused: No    Physically Abused: No    Sexually Abused: No     Allergies  Allergen Reactions   Ace Inhibitors Cough     CBC    Component Value Date/Time   WBC 5.6 11/22/2022 1019   RBC 3.64 (L) 11/22/2022 1019   HGB 11.6 (L) 11/22/2022 1019   HCT 35.1 (L) 11/22/2022 1019   PLT 527 (H) 11/22/2022 1019   MCV 96.4 11/22/2022 1019   MCH 31.9  11/22/2022 1019   MCHC 33.0 11/22/2022 1019   RDW 12.2 11/22/2022 1019   LYMPHSABS 0.9 11/22/2022 1019   MONOABS 0.6 11/22/2022 1019   EOSABS 0.0 11/22/2022 1019   BASOSABS 0.0 11/22/2022 1019    Pulmonary Functions Testing Results:     No data to display          Outpatient Medications Prior to Visit  Medication Sig Dispense Refill   acetaminophen (TYLENOL) 500 MG tablet Take 500 mg by mouth every 6 (six) hours as needed.     ALPRAZolam (XANAX) 0.5 MG tablet Take 0.5 mg by mouth at bedtime as needed for sleep.     apixaban (ELIQUIS) 5 MG TABS tablet Take 1 tablet (5 mg total) by mouth 2 (two) times daily. 180 tablet 3   azelastine (ASTELIN) 0.1 % nasal spray Place 2 sprays into both nostrils 2 (two) times daily. Use in each nostril as directed 30 mL 2   Cholecalciferol 50 MCG (2000 UT) CAPS Take 2,000 Units by mouth daily.     Cyanocobalamin (B-12 COMPLIANCE INJECTION IJ) Inject 1,000 mcg as directed. Once a month     Cyanocobalamin (B-12 PO) Take by mouth daily at 6 (six) AM.     esomeprazole (NEXIUM) 20 MG capsule Take 20 mg by mouth as needed.     Fluticasone-Umeclidin-Vilant (TRELEGY ELLIPTA) 100-62.5-25 MCG/ACT AEPB Inhale 1 puff into the lungs daily as needed.     folic acid (FOLVITE) 1 MG tablet TAKE 1 TABLET BY MOUTH EVERY DAY 90 tablet 3   metoprolol succinate (TOPROL-XL) 50 MG 24 hr tablet Take 1 tablet (50 mg total) by mouth daily. 90 tablet 1   montelukast (SINGULAIR) 10 MG tablet Take 1 tablet by  mouth at bedtime.     spironolactone (ALDACTONE) 25 MG tablet Take 25 mg by mouth daily.     zolpidem (AMBIEN) 5 MG tablet Take 0.5 tablets by mouth at bedtime as needed.     Facility-Administered Medications Prior to Visit  Medication Dose Route Frequency Provider Last Rate Last Admin   heparin lock flush 100 UNIT/ML injection            heparin lock flush 100 UNIT/ML injection            heparin lock flush 100 UNIT/ML injection            heparin lock flush 100 UNIT/ML  injection            heparin lock flush 100 unit/mL  500 Units Intravenous Once Creig Hines, MD       sodium chloride flush (NS) 0.9 % injection 10 mL  10 mL Intravenous Once Creig Hines, MD       sodium chloride flush (NS) 0.9 % injection 10 mL  10 mL Intravenous PRN Creig Hines, MD   10 mL at 01/26/21 0807   sodium chloride flush (NS) 0.9 % injection 10 mL  10 mL Intravenous Once Creig Hines, MD

## 2023-04-14 ENCOUNTER — Other Ambulatory Visit: Payer: Self-pay | Admitting: *Deleted

## 2023-04-14 MED ORDER — ASPIRIN 81 MG PO TBEC: 81.0000 mg | DELAYED_RELEASE_TABLET | Freq: Every day | ORAL | Status: AC

## 2023-04-29 ENCOUNTER — Ambulatory Visit
Admission: RE | Admit: 2023-04-29 | Discharge: 2023-04-29 | Disposition: A | Discharge status: Home / Self Care | Payer: Medicare Other | Source: Ambulatory Visit | Attending: Student in an Organized Health Care Education/Training Program | Admitting: Student in an Organized Health Care Education/Training Program

## 2023-04-29 ENCOUNTER — Ambulatory Visit: Payer: Medicare Other | Admitting: Student in an Organized Health Care Education/Training Program

## 2023-04-29 ENCOUNTER — Encounter: Payer: Self-pay | Admitting: Student in an Organized Health Care Education/Training Program

## 2023-04-29 VITALS — BP 126/60 | HR 68 | Temp 98.0°F | Ht 65.0 in | Wt 138.8 lb

## 2023-04-29 DIAGNOSIS — C3431 Malignant neoplasm of lower lobe, right bronchus or lung: Secondary | ICD-10-CM

## 2023-04-29 DIAGNOSIS — J90 Pleural effusion, not elsewhere classified: Secondary | ICD-10-CM | POA: Diagnosis present

## 2023-04-29 DIAGNOSIS — R0602 Shortness of breath: Secondary | ICD-10-CM

## 2023-04-29 NOTE — Progress Notes (Signed)
Thoracentesis  Procedure Note  Katherine Perry  [id number]  Feb 06, 1956  Date:04/29/23  Time:1:44 PM   Provider Performing:Karianne Nogueira   Procedure: Thoracentesis with imaging guidance (40981)  Indication(s) Pleural Effusion  Consent Risks of the procedure as well as the alternatives and risks of each were explained to the patient and/or caregiver.  Consent for the procedure was obtained and is signed in the bedside chart  Anesthesia Topical only with 1% lidocaine    Time Out Verified patient identification, verified procedure, site/side was marked, verified correct patient position, special equipment/implants available, medications/allergies/relevant history reviewed, required imaging and test results available.   Sterile Technique Maximal sterile technique including full sterile barrier drape, hand hygiene, sterile gown, sterile gloves, mask, hair covering, sterile ultrasound probe cover (if used).  Procedure Description Ultrasound was used to identify appropriate pleural anatomy for placement and overlying skin marked.  Area of drainage cleaned and draped in sterile fashion. Lidocaine was used to anesthetize the skin and subcutaneous tissue. I then attempted to use a 20G needle to anesthetize the pleural space. I was unable to obtain to get pleural fluid back through the 20G needle, even when introduced all the way. At this point, I aborted the procedure, and re-evaluated the space with ultrasound. Again noted a large amount of pleural fluid in the space. I discussed this with the patient, and explained that this could indicate that the pleura has thickened from multiple interventions, or a finding secondary to loculations. We will hold off on the procedure, and re-evaluate in 30 days. Will obtain a CXR today.   [name], MD [facility] 04/29/2023 1:47 PM

## 2023-04-29 NOTE — Progress Notes (Signed)
Assessment & Plan:   #Recurrent Pleural Effusion #Stage III NSCLCa #Pneumothorax Ex-Vacuo   The patient is a pleasant 67 year old female with a recurrent right-sided pleural effusion in the setting of known non-small lung cancer treated with chemoradiation who presents today for follow up.   She developed a pneumothorax following her December 2023 thoracentesis that was associated with chest discomfort consistent with a trapped/entrapped etiology resulting in a pneumothorax ex-vacuo. She developed neck pain during our last thoracentesis again consistent with trapped lung physiology, and thoracentesis stopped after 400 mL of fluid was drained. The pleural fluid cytology has remained negative and chemistries are consistent with an exudate (highly elevated pleural fluid cholesterol). Pleural fluid triglycerides are normal and are inconsistent with a chylothorax.   Patient's rate of re-accumulation has slowed between thoracentesis, and we are two months since our last thoracentesis with some increase in symptoms. Given this, repeat US today shows similar sized pleural effusion. Today, we attempted at thoracentesis (see separate procedure note), though this was aborted.  - DG Chest 2 View; Future   Return in about 4 weeks (around 05/27/2023).  I spent 30 minutes caring for this patient today, including preparing to see the patient, obtaining a medical history , reviewing a separately obtained history, performing a medically appropriate examination and/or evaluation, counseling and educating the patient/family/caregiver, ordering medications, tests, or procedures, and documenting clinical information in the electronic health record  [name], MD [facility] 04/29/2023 12:03 PM    End of visit medications:  No orders of the defined types were placed in this encounter.    Current Outpatient Medications:    acetaminophen (TYLENOL) 500 MG tablet, Take 500 mg by  mouth every 6 (six) hours as needed., Disp: , Rfl:    ALPRAZolam (XANAX) 0.5 MG tablet, Take 0.5 mg by mouth at bedtime as needed for sleep., Disp: , Rfl:    aspirin EC 81 MG tablet, Take 1 tablet (81 mg total) by mouth daily. Swallow whole., Disp: , Rfl:    azelastine (ASTELIN) 0.1 % nasal spray, Place 2 sprays into both nostrils 2 (two) times daily. Use in each nostril as directed, Disp: 30 mL, Rfl: 2   Cholecalciferol 50 MCG (2000 UT) CAPS, Take 2,000 Units by mouth daily., Disp: , Rfl:    Cyanocobalamin (B-12 COMPLIANCE INJECTION IJ), Inject 1,000 mcg as directed. Once a month, Disp: , Rfl:    Cyanocobalamin (B-12 PO), Take by mouth daily at 6 (six) AM., Disp: , Rfl:    esomeprazole (NEXIUM) 20 MG capsule, Take 20 mg by mouth as needed., Disp: , Rfl:    fluticasone-salmeterol (WIXELA INHUB) 100-50 MCG/ACT AEPB, Inhale 1 puff into the lungs as needed., Disp: , Rfl:    folic acid (FOLVITE) 1 MG tablet, TAKE 1 TABLET BY MOUTH EVERY DAY, Disp: 90 tablet, Rfl: 3   metoprolol succinate (TOPROL-XL) 50 MG 24 hr tablet, Take 1 tablet (50 mg total) by mouth daily., Disp: 90 tablet, Rfl: 1   montelukast (SINGULAIR) 10 MG tablet, Take 1 tablet by mouth at bedtime., Disp: , Rfl:    spironolactone (ALDACTONE) 25 MG tablet, Take 25 mg by mouth daily., Disp: , Rfl:    zolpidem (AMBIEN) 5 MG tablet, Take 0.5 tablets by mouth at bedtime as needed., Disp: , Rfl:  No current facility-administered medications for this visit.  Facility-Administered Medications Ordered in Other Visits:    heparin lock flush 100 UNIT/ML injection, , , ,    heparin lock flush 100  UNIT/ML injection, , , ,    heparin lock flush 100 UNIT/ML injection, , , ,    heparin lock flush 100 UNIT/ML injection, , , ,    heparin lock flush 100 unit/mL, 500 Units, Intravenous, Once, Creig Hines, MD   sodium chloride flush (NS) 0.9 % injection 10 mL, 10 mL, Intravenous, Once, Creig Hines, MD   sodium chloride flush (NS) 0.9 % injection 10  mL, 10 mL, Intravenous, PRN, Creig Hines, MD, 10 mL at 01/26/21 0807   sodium chloride flush (NS) 0.9 % injection 10 mL, 10 mL, Intravenous, Once, Creig Hines, MD   Subjective:   PATIENT ID: Katherine Perry GENDER: female DOB: 04-Aug-1955, MRN: [id number]  Chief Complaint  Patient presents with   Follow-up    Cough with occasional phlegm. No shortness of breath or wheezing.    HPI  Ms. Barut is a pleasant 67 year old female with a history of non-small cell lung cancer s/p chemo-radiation with a recurrent pleural effusion who is presenting to clinic for follow up.   She had developed a recurrent right-sided pleural effusion status post multiple thoracentesis. Last thoracentesis was on 03/01/2023 at which point only 400 mL of fluid was drained secondary to neck pain concerning for trapped lung. Patient had previously had pneumothorax ex-vacuo following thoracentesis with IR.   Interval history notes symptom stability. She is having a slight increase in her cough since our last visit. She has no increased shortness of breath or chest tightness, usually a sign of re-accumulating fluid for Ms. Swierczynski. She is no longer on Apixaban.  The pleural fluid had been exudative but cytology remained negative. She was diagnosed with lung cancer in April 2022 (biopsy at that time of the right lower lobe showed non-small cell lung cancer consistent with adenocarcinoma) and received chemoradiation in coordination with oncology and radiation oncology. She underwent thoracentesis in June 2023 (650 mL), September 2023 (1 L), October 2023 (1.2 L), and December 2023 (1.5 L), January 2024 (1.2 L), February 2024 (1 L), March 2024 (two times, on the 13th and 28th), April (4th and 16th), and May 16 (400 mL), January 20, 2023 (500 mL), and March 01, 2023 (400 mL).  The pleural fluid was exudative by lights criteria (LDH and protein sent) and cytology has been negative.   She was found to have a post procedural pneumothorax  following December 2023's thoracentesis for which she required a visit to the ED and a quick admission for observation. She was admitted in April to the hospital for Afib with RVR, and was found to have a small to moderate pericardial effusion. She was seen by cardiology, continued on Eliquis, and repeat echo was ordered for follow up of the pericardial effusion.  POCUS today shows persistent right sided pleural effusion.  Ancillary information including prior medications, full medical/surgical/family/social histories, and PFTs (when available) are listed below and have been reviewed.   Review of Systems  Constitutional:  Negative for chills and fever.  Respiratory:  Negative for cough, sputum production, shortness of breath and wheezing.   Cardiovascular:  Negative for chest pain.  Skin:  Negative for rash.     Objective:   Vitals:   04/29/23 1017  BP: 126/60  Pulse: 68  Temp: 98 F (36.7 C)  TempSrc: Temporal  SpO2: 100%  Weight: 138 lb 12.8 oz (63 kg)  Height: 5\' 5"  (1.651 m)   100% on RA BMI Readings from Last 3 Encounters:  04/29/23 23.10  kg/m  04/12/23 23.03 kg/m  04/01/23 22.95 kg/m   Wt Readings from Last 3 Encounters:  04/29/23 138 lb 12.8 oz (63 kg)  04/12/23 138 lb 6.4 oz (62.8 kg)  04/01/23 137 lb 14.4 oz (62.6 kg)    Physical Exam Constitutional:      Appearance: Normal appearance. She is not ill-appearing.  HENT:     Head: Normocephalic.     Mouth/Throat:     Mouth: Mucous membranes are moist.  Cardiovascular:     Rate and Rhythm: Normal rate and regular rhythm.     Heart sounds: Normal heart sounds.  Pulmonary:     Comments: Decreased breath sounds over the right lower lung field. Good air entry over the left. No wheezing noted. Neurological:     General: No focal deficit present.     Mental Status: She is alert and oriented to person, place, and time. Mental status is at baseline.       Ancillary Information    Past Medical History:   Diagnosis Date   Anxiety    Asthma    Cancer (HCC)    Cough    Dyspnea    with exertion   GERD (gastroesophageal reflux disease)    Hypertension    Lung cancer (HCC)      Family History  Problem Relation Age of Onset   Heart attack Father    Heart disease Father    Heart disease Sister    Breast cancer Neg Hx      Past Surgical History:  Procedure Laterality Date   BUNIONECTOMY Bilateral    COLONOSCOPY     COLONOSCOPY WITH PROPOFOL N/A 07/30/2021   Procedure: COLONOSCOPY WITH PROPOFOL;  Surgeon: Jaynie Collins, DO;  Location: [facility] ENDOSCOPY;  Service: Gastroenterology;  Laterality: N/A;   ESOPHAGOGASTRODUODENOSCOPY  04/01/2006   FLEXIBLE BRONCHOSCOPY Right 10/13/2022   Procedure: FLEXIBLE BRONCHOSCOPY;  Surgeon: [name], MD;  Location: [facility] ORS;  Service: Pulmonary;  Laterality: Right;   PORTA CATH INSERTION N/A 12/22/2020   Procedure: PORTA CATH INSERTION;  Surgeon: Annice Needy, MD;  Location: [facility] INVASIVE CV LAB;  Service: Cardiovascular;  Laterality: N/A;   PORTA CATH REMOVAL N/A 05/31/2022   Procedure: PORTA CATH REMOVAL;  Surgeon: Annice Needy, MD;  Location: [facility] INVASIVE CV LAB;  Service: Cardiovascular;  Laterality: N/A;   THORACENTESIS     VIDEO BRONCHOSCOPY WITH ENDOBRONCHIAL NAVIGATION N/A 11/07/2020   Procedure: VIDEO BRONCHOSCOPY WITH ENDOBRONCHIAL NAVIGATION;  Surgeon: Vida Rigger, MD;  Location: [facility] ORS;  Service: Thoracic;  Laterality: N/A;   VIDEO BRONCHOSCOPY WITH ENDOBRONCHIAL ULTRASOUND N/A 11/07/2020   Procedure: VIDEO BRONCHOSCOPY WITH ENDOBRONCHIAL ULTRASOUND;  Surgeon: Vida Rigger, MD;  Location: [facility] ORS;  Service: Thoracic;  Laterality: N/A;   VIDEO BRONCHOSCOPY WITH ENDOBRONCHIAL ULTRASOUND Right 10/13/2022   Procedure: VIDEO BRONCHOSCOPY WITH ENDOBRONCHIAL ULTRASOUND;  Surgeon: [name], MD;  Location: [facility] ORS;  Service: Pulmonary;  Laterality: Right;    Social History   Socioeconomic History   Marital status:  Married    Spouse name: Not on file   Number of children: Not on file   Years of education: Not on file   Highest education level: Not on file  Occupational History   Not on file  Tobacco Use   Smoking status: Former    Current packs/day: 0.00    Average packs/day: 1.5 packs/day for 26.8 years (40.2 ttl pk-yrs)    Types: Cigarettes    Start date: 01/19/1968    Quit date: 11/07/1994  Years since quitting: 28.4   Smokeless tobacco: Never  Vaping Use   Vaping status: Never Used  Substance and Sexual Activity   Alcohol use: Yes    Comment:  wine daily   Drug use: Never   Sexual activity: Yes  Other Topics Concern   Not on file  Social History Narrative   Lives with husband, Zollie Beckers, no pets.   Social Determinants of Health   Financial Resource Strain: Not on file  Food Insecurity: No Food Insecurity (11/05/2022)   Hunger Vital Sign    Worried About Running Out of Food in the Last Year: Never true    Ran Out of Food in the Last Year: Never true  Transportation Needs: No Transportation Needs (11/05/2022)   PRAPARE - Administrator, Civil Service (Medical): No    Lack of Transportation (Non-Medical): No  Physical Activity: Not on file  Stress: Not on file  Social Connections: Not on file  Intimate Partner Violence: Not At Risk (11/05/2022)   Humiliation, Afraid, Rape, and Kick questionnaire    Fear of Current or Ex-Partner: No    Emotionally Abused: No    Physically Abused: No    Sexually Abused: No     Allergies  Allergen Reactions   Ace Inhibitors Cough     CBC    Component Value Date/Time   WBC 5.6 11/22/2022 1019   RBC 3.64 (L) 11/22/2022 1019   HGB 11.6 (L) 11/22/2022 1019   HCT 35.1 (L) 11/22/2022 1019   PLT 527 (H) 11/22/2022 1019   MCV 96.4 11/22/2022 1019   MCH 31.9 11/22/2022 1019   MCHC 33.0 11/22/2022 1019   RDW 12.2 11/22/2022 1019   LYMPHSABS 0.9 11/22/2022 1019   MONOABS 0.6 11/22/2022 1019   EOSABS 0.0 11/22/2022 1019   BASOSABS 0.0  11/22/2022 1019    Pulmonary Functions Testing Results:     No data to display          Outpatient Medications Prior to Visit  Medication Sig Dispense Refill   acetaminophen (TYLENOL) 500 MG tablet Take 500 mg by mouth every 6 (six) hours as needed.     ALPRAZolam (XANAX) 0.5 MG tablet Take 0.5 mg by mouth at bedtime as needed for sleep.     aspirin EC 81 MG tablet Take 1 tablet (81 mg total) by mouth daily. Swallow whole.     azelastine (ASTELIN) 0.1 % nasal spray Place 2 sprays into both nostrils 2 (two) times daily. Use in each nostril as directed 30 mL 2   Cholecalciferol 50 MCG (2000 UT) CAPS Take 2,000 Units by mouth daily.     Cyanocobalamin (B-12 COMPLIANCE INJECTION IJ) Inject 1,000 mcg as directed. Once a month     Cyanocobalamin (B-12 PO) Take by mouth daily at 6 (six) AM.     esomeprazole (NEXIUM) 20 MG capsule Take 20 mg by mouth as needed.     fluticasone-salmeterol (WIXELA INHUB) 100-50 MCG/ACT AEPB Inhale 1 puff into the lungs as needed.     folic acid (FOLVITE) 1 MG tablet TAKE 1 TABLET BY MOUTH EVERY DAY 90 tablet 3   metoprolol succinate (TOPROL-XL) 50 MG 24 hr tablet Take 1 tablet (50 mg total) by mouth daily. 90 tablet 1   montelukast (SINGULAIR) 10 MG tablet Take 1 tablet by mouth at bedtime.     spironolactone (ALDACTONE) 25 MG tablet Take 25 mg by mouth daily.     zolpidem (AMBIEN) 5 MG tablet Take 0.5 tablets by  mouth at bedtime as needed.     Fluticasone-Umeclidin-Vilant (TRELEGY ELLIPTA) 100-62.5-25 MCG/ACT AEPB Inhale 1 puff into the lungs daily as needed.     Facility-Administered Medications Prior to Visit  Medication Dose Route Frequency Provider Last Rate Last Admin   heparin lock flush 100 UNIT/ML injection            heparin lock flush 100 UNIT/ML injection            heparin lock flush 100 UNIT/ML injection            heparin lock flush 100 UNIT/ML injection            heparin lock flush 100 unit/mL  500 Units Intravenous Once Creig Hines, MD        sodium chloride flush (NS) 0.9 % injection 10 mL  10 mL Intravenous Once Creig Hines, MD       sodium chloride flush (NS) 0.9 % injection 10 mL  10 mL Intravenous PRN Creig Hines, MD   10 mL at 01/26/21 0807   sodium chloride flush (NS) 0.9 % injection 10 mL  10 mL Intravenous Once Creig Hines, MD

## 2023-05-03 ENCOUNTER — Ambulatory Visit: Payer: Medicare Other | Admitting: Student in an Organized Health Care Education/Training Program

## 2023-05-04 ENCOUNTER — Ambulatory Visit: Payer: BC Managed Care – PPO | Admitting: Radiation Oncology

## 2023-05-15 ENCOUNTER — Other Ambulatory Visit: Payer: Self-pay | Admitting: Oncology

## 2023-05-15 DIAGNOSIS — E538 Deficiency of other specified B group vitamins: Secondary | ICD-10-CM

## 2023-05-16 ENCOUNTER — Encounter: Payer: Self-pay | Admitting: Oncology

## 2023-05-20 ENCOUNTER — Telehealth: Payer: Self-pay | Admitting: Student in an Organized Health Care Education/Training Program

## 2023-05-20 DIAGNOSIS — J90 Pleural effusion, not elsewhere classified: Secondary | ICD-10-CM

## 2023-05-20 NOTE — Telephone Encounter (Signed)
The hospital scheduling department will contact her with an appt. I have notified the patient.  Nothing further needed.

## 2023-05-20 NOTE — Telephone Encounter (Signed)
Patient would like to get fluid drained from R lung. Patient phone number is [phone].

## 2023-05-20 NOTE — Telephone Encounter (Signed)
I spoke with the patient. She said you told her she would have to have this done at the hospital. She would like for you to place the order to have it done.

## 2023-05-25 ENCOUNTER — Ambulatory Visit
Admission: RE | Admit: 2023-05-25 | Discharge: 2023-05-25 | Disposition: A | Discharge status: Home / Self Care | Payer: Medicare Other | Source: Ambulatory Visit | Attending: Student in an Organized Health Care Education/Training Program | Admitting: Student in an Organized Health Care Education/Training Program

## 2023-05-25 ENCOUNTER — Other Ambulatory Visit: Payer: Self-pay | Admitting: Student

## 2023-05-25 ENCOUNTER — Ambulatory Visit
Admission: RE | Admit: 2023-05-25 | Discharge: 2023-05-25 | Disposition: A | Discharge status: Home / Self Care | Payer: Medicare Other | Source: Ambulatory Visit | Attending: Student | Admitting: Student

## 2023-05-25 ENCOUNTER — Ambulatory Visit: Payer: Self-pay | Admitting: Radiation Oncology

## 2023-05-25 DIAGNOSIS — Z9889 Other specified postprocedural states: Secondary | ICD-10-CM

## 2023-05-25 DIAGNOSIS — J90 Pleural effusion, not elsewhere classified: Secondary | ICD-10-CM | POA: Diagnosis present

## 2023-05-25 LAB — BODY FLUID CELL COUNT WITH DIFFERENTIAL
Eos, Fluid: 0 %
Lymphs, Fluid: 95 %
Monocyte-Macrophage-Serous Fluid: 3 %
Neutrophil Count, Fluid: 2 %
Total Nucleated Cell Count, Fluid: 674 uL

## 2023-05-25 MED ORDER — LIDOCAINE HCL (PF) 1 % IJ SOLN
10.0000 mL | Freq: Once | INTRAMUSCULAR | Status: AC
  Administered 2023-05-25: 10 mL via INTRADERMAL
  Filled 2023-05-25: qty 10

## 2023-05-25 NOTE — Procedures (Signed)
PROCEDURE SUMMARY:  Successful image-guided right thoracentesis. Yielded 300 mL of amber fluid. Pt tolerated procedure well. No immediate complications. EBL = trace   Specimen was sent for labs. CXR ordered.  Please see imaging section of Epic for full dictation.  Kennieth Francois PA-C 05/25/2023 2:22 PM

## 2023-05-26 LAB — CYTOLOGY - NON PAP

## 2023-05-27 ENCOUNTER — Ambulatory Visit: Payer: Medicare Other | Admitting: Student in an Organized Health Care Education/Training Program

## 2023-07-12 ENCOUNTER — Encounter: Payer: Self-pay | Admitting: Oncology

## 2023-08-09 ENCOUNTER — Ambulatory Visit
Admission: RE | Admit: 2023-08-09 | Discharge: 2023-08-09 | Disposition: A | Discharge status: Home / Self Care | Payer: Medicare Other | Source: Ambulatory Visit | Attending: Oncology | Admitting: Oncology

## 2023-08-09 DIAGNOSIS — Z08 Encounter for follow-up examination after completed treatment for malignant neoplasm: Secondary | ICD-10-CM

## 2023-08-09 DIAGNOSIS — Z85118 Personal history of other malignant neoplasm of bronchus and lung: Secondary | ICD-10-CM | POA: Insufficient documentation

## 2023-08-10 ENCOUNTER — Telehealth: Payer: Self-pay

## 2023-08-10 DIAGNOSIS — J90 Pleural effusion, not elsewhere classified: Secondary | ICD-10-CM

## 2023-08-10 NOTE — Telephone Encounter (Signed)
 Patient called with increased SOB and cough. She is wanting to get an appt to have a thoracentesis.

## 2023-08-10 NOTE — Telephone Encounter (Signed)
 The patient is aware.  Nothing further needed.

## 2023-08-11 ENCOUNTER — Ambulatory Visit
Admission: RE | Admit: 2023-08-11 | Discharge: 2023-08-11 | Disposition: A | Discharge status: Home / Self Care | Payer: Medicare Other | Source: Ambulatory Visit | Attending: Radiology | Admitting: Radiology

## 2023-08-11 ENCOUNTER — Ambulatory Visit
Admission: RE | Admit: 2023-08-11 | Discharge: 2023-08-11 | Disposition: A | Discharge status: Home / Self Care | Payer: Medicare Other | Source: Ambulatory Visit | Attending: Student in an Organized Health Care Education/Training Program | Admitting: Student in an Organized Health Care Education/Training Program

## 2023-08-11 ENCOUNTER — Ambulatory Visit: Payer: Medicare Other | Admitting: Pulmonary Disease

## 2023-08-11 DIAGNOSIS — J90 Pleural effusion, not elsewhere classified: Secondary | ICD-10-CM

## 2023-08-11 DIAGNOSIS — Z923 Personal history of irradiation: Secondary | ICD-10-CM | POA: Insufficient documentation

## 2023-08-11 DIAGNOSIS — R059 Cough, unspecified: Secondary | ICD-10-CM | POA: Insufficient documentation

## 2023-08-11 LAB — BODY FLUID CELL COUNT WITH DIFFERENTIAL
Eos, Fluid: 0 %
Lymphs, Fluid: 87 %
Monocyte-Macrophage-Serous Fluid: 10 %
Neutrophil Count, Fluid: 3 %
Total Nucleated Cell Count, Fluid: 790 uL

## 2023-08-11 MED ORDER — LIDOCAINE HCL (PF) 1 % IJ SOLN
10.0000 mL | Freq: Once | INTRAMUSCULAR | Status: AC
  Administered 2023-08-11: 10 mL via INTRADERMAL
  Filled 2023-08-11: qty 10

## 2023-08-11 NOTE — Procedures (Signed)
 PROCEDURE SUMMARY:  Successful US  guided RIGHT thoracentesis. Yielded 300 mL of amber/blood tinged colored fluid. Pt tolerated procedure well. No immediate complications.  Specimen was sent for labs. CXR ordered.  EBL < 1 mL  JOESPH VALDA JONETTA DEVONNA 08/11/2023 2:58 PM

## 2023-08-12 LAB — CYTOLOGY - NON PAP

## 2023-08-13 ENCOUNTER — Other Ambulatory Visit: Payer: Self-pay | Admitting: Cardiovascular Disease

## 2023-08-15 ENCOUNTER — Other Ambulatory Visit: Payer: Self-pay | Admitting: *Deleted

## 2023-08-15 ENCOUNTER — Encounter: Payer: Self-pay | Admitting: Oncology

## 2023-08-15 ENCOUNTER — Inpatient Hospital Stay: Payer: Medicare Other | Attending: Oncology | Admitting: Oncology

## 2023-08-15 VITALS — BP 165/73 | HR 105 | Temp 97.2°F | Ht 65.0 in | Wt 141.0 lb

## 2023-08-15 DIAGNOSIS — Z08 Encounter for follow-up examination after completed treatment for malignant neoplasm: Secondary | ICD-10-CM

## 2023-08-15 DIAGNOSIS — Z85118 Personal history of other malignant neoplasm of bronchus and lung: Secondary | ICD-10-CM | POA: Insufficient documentation

## 2023-08-15 DIAGNOSIS — Z87891 Personal history of nicotine dependence: Secondary | ICD-10-CM | POA: Insufficient documentation

## 2023-08-15 DIAGNOSIS — C3431 Malignant neoplasm of lower lobe, right bronchus or lung: Secondary | ICD-10-CM

## 2023-08-15 MED ORDER — HYDROCODONE BIT-HOMATROP MBR 5-1.5 MG/5ML PO SOLN: 5.0000 mL | Freq: Four times a day (QID) | ORAL | 0 refills | Status: DC | PRN

## 2023-08-15 NOTE — Progress Notes (Signed)
 Orders in

## 2023-08-15 NOTE — Progress Notes (Signed)
 Hematology/Oncology Consult note [facility]  Telephone:([phone] Fax:([phone]) [phone]  Patient Care Team: Cleotilde Oneil FALCON, MD as PCP - General (Internal Medicine) Darron Deatrice LABOR, MD as PCP - Cardiology (Cardiology) Verdene Gills, RN as Oncology Nurse Navigator Isadora Hose, MD as Consulting Physician (Pulmonary Disease) Melanee Annah BROCKS, MD as Consulting Physician (Oncology)   Name of the patient: Katherine Perry  [id number]  06/19/1956   Date of visit: 08/15/23  Diagnosis- history of stage III right lung cancer in June 2022 currently in remission   Chief complaint/ Reason for visit- routine f/u of breast cancer  Heme/Onc history:  patient is a 68 year old female with a past medical history significant for 1-1/2 pack/day smoking for about 20 years.  She quit smoking about 26 years ago.  Other medical problems include hypertension and hyponatremia.She has been treated for iron of possible pneumonia for the last 1 year.  She has a history of intermittent asthma for which she uses Advair.  She was seen by Dr. And underwent CT chest which showed dense infiltrate/solid mass in the right lower lobe with contiguous airspace opacity in the right upper lobe.  Findings could be secondary to pneumonia but concerning for bronchogenic carcinoma.  Patient then had a PET CT scan which showed consolidation in the medial aspect of the right lower lobe measuring 3.4 x 4 cm with an SUV of 11.6.  Hypermetabolic masslike thickening in the right hilum measuring 2.2 cm with intense hypermetabolic activity.  Groundglass airspace consolidation in the posterior aspect of the right upper lobe with an SUV of 11.4.  The right upper lobe opacity was nonspecific and differentials include pulmonary infection versus lymphangitic spread of carcinoma.  CT chest showed showed masslike area of distortion involving right lower lobe middle lobe as well as right upper lobe.  Generalized right hilar masslike  appearance concerning for nodal involvement.   Patient underwent bronchoscopy and biopsies.Right lower lobe was concerning for adenocarcinoma.  Lymph node station 11 R, 4R, 7 were negative for malignancy.  No malignant cells identified in the right upper lobe.  Case was discussed at tumor board and given the hypermetabolism in the right upper lobe as well as hilum involvement cannot be ruled out despite negative biopsies.  Patient completed concurrent chemoradiation with weekly CarboTaxol in July 2022.  Scan showed partial response.  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  Cytology negative for malignancy so far    Interval history-she has been having a nagging ongoing cough which has been affecting her day of life.  This is persistent despite using antiallergy medications as well as her nebulizer treatments and Singulair .  ECOG PS- 1 Pain scale- 0 Opioid associated constipation- no  Review of systems- Review of Systems  Constitutional:  Negative for chills, fever, malaise/fatigue and weight loss.  HENT:  Negative for congestion, ear discharge and nosebleeds.   Eyes:  Negative for blurred vision.  Respiratory:  Positive for cough. Negative for hemoptysis, sputum production, shortness of breath and wheezing.   Cardiovascular:  Negative for chest pain, palpitations, orthopnea and claudication.  Gastrointestinal:  Negative for abdominal pain, blood in stool, constipation, diarrhea, heartburn, melena, nausea and vomiting.  Genitourinary:  Negative for dysuria, flank pain, frequency, hematuria and urgency.  Musculoskeletal:  Negative for back pain, joint pain and myalgias.  Skin:  Negative for rash.  Neurological:  Negative for dizziness, tingling, focal  weakness, seizures, weakness and headaches.  Endo/Heme/Allergies:  Does not bruise/bleed easily.  Psychiatric/Behavioral:   Negative for depression and suicidal ideas. The patient does not have insomnia.       Allergies  Allergen Reactions   Ace Inhibitors Cough     Past Medical History:  Diagnosis Date   Anxiety    Asthma    Cancer (HCC)    Cough    Dyspnea    with exertion   GERD (gastroesophageal reflux disease)    Hypertension    Lung cancer [facility])      Past Surgical History:  Procedure Laterality Date   BUNIONECTOMY Bilateral    COLONOSCOPY     COLONOSCOPY WITH PROPOFOL  N/A 07/30/2021   Procedure: COLONOSCOPY WITH PROPOFOL ;  Surgeon: Onita Elspeth Sharper, DO;  Location: [facility] ENDOSCOPY;  Service: Gastroenterology;  Laterality: N/A;   ESOPHAGOGASTRODUODENOSCOPY  04/01/2006   FLEXIBLE BRONCHOSCOPY Right 10/13/2022   Procedure: FLEXIBLE BRONCHOSCOPY;  Surgeon: Isadora Hose, MD;  Location: [facility] ORS;  Service: Pulmonary;  Laterality: Right;   PORTA CATH INSERTION N/A 12/22/2020   Procedure: PORTA CATH INSERTION;  Surgeon: Marea Selinda RAMAN, MD;  Location: [facility] INVASIVE CV LAB;  Service: Cardiovascular;  Laterality: N/A;   PORTA CATH REMOVAL N/A 05/31/2022   Procedure: PORTA CATH REMOVAL;  Surgeon: Marea Selinda RAMAN, MD;  Location: [facility] INVASIVE CV LAB;  Service: Cardiovascular;  Laterality: N/A;   THORACENTESIS     VIDEO BRONCHOSCOPY WITH ENDOBRONCHIAL NAVIGATION N/A 11/07/2020   Procedure: VIDEO BRONCHOSCOPY WITH ENDOBRONCHIAL NAVIGATION;  Surgeon: Parris Manna, MD;  Location: [facility] ORS;  Service: Thoracic;  Laterality: N/A;   VIDEO BRONCHOSCOPY WITH ENDOBRONCHIAL ULTRASOUND N/A 11/07/2020   Procedure: VIDEO BRONCHOSCOPY WITH ENDOBRONCHIAL ULTRASOUND;  Surgeon: Parris Manna, MD;  Location: [facility] ORS;  Service: Thoracic;  Laterality: N/A;   VIDEO BRONCHOSCOPY WITH ENDOBRONCHIAL ULTRASOUND Right 10/13/2022   Procedure: VIDEO BRONCHOSCOPY WITH ENDOBRONCHIAL ULTRASOUND;  Surgeon: Isadora Hose, MD;  Location: [facility] ORS;  Service: Pulmonary;  Laterality: Right;    Social History   Socioeconomic  History   Marital status: Married    Spouse name: Not on file   Number of children: Not on file   Years of education: Not on file   Highest education level: Not on file  Occupational History   Not on file  Tobacco Use   Smoking status: Former    Current packs/day: 0.00    Average packs/day: 1.5 packs/day for 26.8 years (40.2 ttl pk-yrs)    Types: Cigarettes    Start date: 01/19/1968    Quit date: 11/07/1994    Years since quitting: 28.7   Smokeless tobacco: Never  Vaping Use   Vaping status: Never Used  Substance and Sexual Activity   Alcohol use: Yes    Comment:  wine daily   Drug use: Never   Sexual activity: Yes  Other Topics Concern   Not on file  Social History Narrative   Lives with husband, Ryan, no pets.   Social Drivers of Corporate Investment Banker Strain: Not on file  Food Insecurity: No Food Insecurity (11/05/2022)   Hunger Vital Sign    Worried About Running Out of Food in the Last Year: Never true    Ran Out of Food in the Last Year: Never true  Transportation Needs: No Transportation Needs (11/05/2022)   PRAPARE - Administrator, Civil Service (Medical): No    Lack of Transportation (Non-Medical): No  Physical Activity: Not on file  Stress: Not on file  Social Connections: Not on file  Intimate Partner Violence: Not At Risk (11/05/2022)   Humiliation, Afraid, Rape, and Kick questionnaire    Fear of Current or Ex-Partner: No    Emotionally Abused: No    Physically Abused: No    Sexually Abused: No    Family History  Problem Relation Age of Onset   Heart attack Father    Heart disease Father    Heart disease Sister    Breast cancer Neg Hx      Current Outpatient Medications:    acetaminophen  (TYLENOL ) 500 MG tablet, Take 500 mg by mouth every 6 (six) hours as needed., Disp: , Rfl:    ALPRAZolam  (XANAX ) 0.5 MG tablet, Take 0.5 mg by mouth at bedtime as needed for sleep., Disp: , Rfl:    aspirin  EC 81 MG tablet, Take 1 tablet (81 mg  total) by mouth daily. Swallow whole., Disp: , Rfl:    azelastine  (ASTELIN ) 0.1 % nasal spray, Place 2 sprays into both nostrils 2 (two) times daily. Use in each nostril as directed, Disp: 30 mL, Rfl: 2   Cholecalciferol  50 MCG (2000 UT) CAPS, Take 2,000 Units by mouth daily., Disp: , Rfl:    Cyanocobalamin (B-12 COMPLIANCE INJECTION IJ), Inject 1,000 mcg as directed. Once a month, Disp: , Rfl:    Cyanocobalamin (B-12 PO), Take by mouth daily at 6 (six) AM., Disp: , Rfl:    esomeprazole (NEXIUM) 20 MG capsule, Take 20 mg by mouth as needed., Disp: , Rfl:    fluticasone -salmeterol (WIXELA INHUB) 100-50 MCG/ACT AEPB, Inhale 1 puff into the lungs as needed., Disp: , Rfl:    folic acid  (FOLVITE ) 1 MG tablet, TAKE 1 TABLET BY MOUTH EVERY DAY, Disp: 90 tablet, Rfl: 3   metoprolol  succinate (TOPROL -XL) 50 MG 24 hr tablet, TAKE 1 TABLET BY MOUTH EVERY DAY, Disp: 90 tablet, Rfl: 1   spironolactone  (ALDACTONE ) 25 MG tablet, Take 25 mg by mouth daily., Disp: , Rfl:    HYDROcodone  bit-homatropine (HYCODAN) 5-1.5 MG/5ML syrup, Take 5 mLs by mouth every 6 (six) hours as needed for cough., Disp: 120 mL, Rfl: 0   montelukast  (SINGULAIR ) 10 MG tablet, Take 1 tablet by mouth at bedtime., Disp: , Rfl:    zolpidem  (AMBIEN ) 5 MG tablet, Take 0.5 tablets by mouth at bedtime as needed., Disp: , Rfl:  No current facility-administered medications for this visit.  Facility-Administered Medications Ordered in Other Visits:    heparin  lock flush 100 UNIT/ML injection, , , ,    heparin  lock flush 100 UNIT/ML injection, , , ,    heparin  lock flush 100 UNIT/ML injection, , , ,    heparin  lock flush 100 UNIT/ML injection, , , ,    heparin  lock flush 100 unit/mL, 500 Units, Intravenous, Once, Melanee Annah BROCKS, MD   sodium chloride  flush (NS) 0.9 % injection 10 mL, 10 mL, Intravenous, Once, Melanee Annah BROCKS, MD   sodium chloride  flush (NS) 0.9 % injection 10 mL, 10 mL, Intravenous, PRN, Melanee Annah BROCKS, MD, 10 mL at 01/26/21 0807    sodium chloride  flush (NS) 0.9 % injection 10 mL, 10 mL, Intravenous, Once, Melanee Annah BROCKS, MD  Physical exam:  Vitals:   08/15/23 1129  BP: (!) 165/73  Pulse: (!) 105  Temp: (!) 97.2 F (36.2 C)  TempSrc: Tympanic  SpO2: 100%  Weight: 141 lb (64 kg)  Height: 5' 5 (1.651 m)   Physical Exam Cardiovascular:     Rate and Rhythm: Normal rate  and regular rhythm.     Heart sounds: Normal heart sounds.  Pulmonary:     Effort: Pulmonary effort is normal.     Breath sounds: Normal breath sounds.  Abdominal:     General: Bowel sounds are normal.     Palpations: Abdomen is soft.  Skin:    General: Skin is warm and dry.  Neurological:     Mental Status: She is alert and oriented to person, place, and time.         Latest Ref Rng & Units 11/22/2022   10:19 AM  CMP  Glucose 70 - 99 mg/dL 91   BUN 8 - 23 mg/dL 10   Creatinine 9.55 - 1.00 mg/dL 9.14   Sodium 864 - 854 mmol/L 129   Potassium 3.5 - 5.1 mmol/L 3.5   Chloride 98 - 111 mmol/L 89   CO2 22 - 32 mmol/L 24   Calcium  8.9 - 10.3 mg/dL 8.8   Total Protein 6.5 - 8.1 g/dL 8.2   Total Bilirubin 0.3 - 1.2 mg/dL 0.7   Alkaline Phos 38 - 126 U/L 128   AST 15 - 41 U/L 19   ALT 0 - 44 U/L 13       Latest Ref Rng & Units 11/22/2022   10:19 AM  CBC  WBC 4.0 - 10.5 K/uL 5.6   Hemoglobin 12.0 - 15.0 g/dL 88.3   Hematocrit 63.9 - 46.0 % 35.1   Platelets 150 - 400 K/uL 527     No images are attached to the encounter.  CT Chest Wo Contrast Result Date: 08/15/2023 CLINICAL DATA:  Lung cancer.  Cough.  * Tracking Code: BO * EXAM: CT CHEST WITHOUT CONTRAST TECHNIQUE: Multidetector CT imaging of the chest was performed following the standard protocol without IV contrast. RADIATION DOSE REDUCTION: This exam was performed according to the departmental dose-optimization program which includes automated exposure control, adjustment of the mA and/or kV according to patient size and/or use of iterative reconstruction technique. COMPARISON:   03/21/2023. FINDINGS: Cardiovascular: Atherosclerotic calcification of the aorta, aortic valve and coronary arteries. Heart is at the upper limits of normal in size to mildly enlarged. No pericardial effusion. Mediastinum/Nodes: No pathologically enlarged mediastinal lymph nodes. Right hilum is difficult to evaluate without IV contrast. No axillary adenopathy. Esophagus is grossly unremarkable. Lungs/Pleura: Post treatment right middle and right lower lobe collapse/consolidation, unchanged. Associated moderate right fibrothorax, also stable. Mild paraseptal emphysema. Vague 4 mm left lower lobe nodule (3/71), unchanged. No new pulmonary nodules. No left pleural fluid. Narrowing/obstruction of the right middle and right lower lobe bronchi, as before. Airway is otherwise unremarkable. Upper Abdomen: Visualized portions of the liver, gallbladder, adrenal glands, kidneys, spleen, pancreas, stomach and bowel are grossly unremarkable. No upper abdominal adenopathy. Musculoskeletal: Degenerative changes in the spine. No worrisome lytic or sclerotic lesions. IMPRESSION: 1. Post treatment collapse/consolidation in the perihilar right lung with a moderate right fibrothorax, stable. No evidence of recurrent or metastatic disease. 2. Vague 4 mm left lower lobe nodule, unchanged from 03/21/2023. This can be reassessed on routine follow-up. 3. Aortic atherosclerosis (ICD10-I70.0). Coronary artery calcification. 4.  Emphysema (ICD10-J43.9). Electronically Signed   By: Newell Eke M.D.   On: 08/15/2023 13:32   US  THORACENTESIS ASP PLEURAL SPACE W/IMG GUIDE Result Date: 08/11/2023 INDICATION: Patient with history of right lower lobe lung cancer in 2022 status post treatment with chemotherapy and radiation, patient complains of ongoing cough with recent imaging revealing right-sided pleural effusion request received for diagnostic and therapeutic  thoracentesis. EXAM: ULTRASOUND GUIDED RIGHT THORACENTESIS MEDICATIONS: Local 1%  lidocaine  only. COMPLICATIONS: None immediate. PROCEDURE: An ultrasound guided thoracentesis was thoroughly discussed with the patient and questions answered. The benefits, risks, alternatives and complications were also discussed. The patient understands and wishes to proceed with the procedure. Written consent was obtained. Ultrasound was performed to localize and mark an adequate pocket of fluid in the right chest. The area was then prepped and draped in the normal sterile fashion. 1% Lidocaine  was used for local anesthesia. Under ultrasound guidance a 19 gauge, 7-cm, Yueh catheter was introduced. Thoracentesis was unable to be performed at this level secondary to the viscosity of the fluid. An area lower to the previous access site was then evaluated. 1% Lidocaine  was used for local anesthesia. Under ultrasound guidance a 19 gauge, 7-cm, Yueh catheter was introduced. Thoracentesis was performed. The catheter was removed and a dressing applied. Vital signs remained stable throughout the entirety of the procedure. FINDINGS: A total of approximately 300 mL of amber/blood-tinged fluid was removed. Samples were sent to the laboratory as requested by the clinical team. IMPRESSION: Successful ultrasound guided right thoracentesis yielding 300 mL of pleural fluid. This exam was performed by Valda Search PA-C, and was supervised and interpreted by Dr. Jennefer. Electronically Signed   By: Ester Jennefer M.D.   On: 08/11/2023 16:13   DG Chest Port 1 View Result Date: 08/11/2023 CLINICAL DATA:  Post thoracentesis EXAM: PORTABLE CHEST 1 VIEW COMPARISON:  05/25/2023, CT 08/09/2023 FINDINGS: Moderate slightly loculated appearing right pleural effusion with airspace disease at the right base. Enlarged cardiomediastinal silhouette. Left lung is clear. IMPRESSION: Moderate slightly loculated appearing right pleural effusion with airspace disease at the right base, similar compared with prior radiograph. No right pneumothorax.  Electronically Signed   By: Luke Bun M.D.   On: 08/11/2023 15:25     Assessment and plan- Patient is a 68 y.o. 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  She is s/p concurrent CarboTaxol chemotherapy followed by 1 year of maintenance durvalumab  ending in July 2023.  She is here to discuss CT scan results and further management   I have reviewed CT chest images independently and discussed findings with the patient which does not show any evidence of recurrent or progressive disease.  She has persistent posttreatment consider radiation in the perihilar right lung.  I will continue to follow-up for lung cancer with repeat scans every 6 months.  Cough: Likely secondary to prior radiation and inflammatory changes noted in the right lung.  I will prescribe as needed Hycodan and see if it helps.  I will also reach out to Dr. Isadora to see if there is anything else we can do for her cough at this time   Visit Diagnosis 1. Encounter for follow-up surveillance of lung cancer      Dr. Annah Skene, MD, MPH [facility] at [facility] [phone] 08/15/2023 3:12 PM

## 2023-11-04 ENCOUNTER — Encounter: Payer: Self-pay | Admitting: Pulmonary Disease

## 2023-11-04 ENCOUNTER — Ambulatory Visit
Admission: RE | Admit: 2023-11-04 | Discharge: 2023-11-04 | Disposition: A | Discharge status: Home / Self Care | Source: Ambulatory Visit | Attending: Pulmonary Disease | Admitting: Pulmonary Disease

## 2023-11-04 ENCOUNTER — Ambulatory Visit: Admitting: Pulmonary Disease

## 2023-11-04 ENCOUNTER — Ambulatory Visit: Payer: Self-pay | Admitting: Student in an Organized Health Care Education/Training Program

## 2023-11-04 VITALS — BP 136/84 | HR 95 | Temp 97.6°F | Ht 65.0 in | Wt 138.6 lb

## 2023-11-04 DIAGNOSIS — J209 Acute bronchitis, unspecified: Secondary | ICD-10-CM | POA: Diagnosis present

## 2023-11-04 DIAGNOSIS — J440 Chronic obstructive pulmonary disease with acute lower respiratory infection: Secondary | ICD-10-CM | POA: Diagnosis present

## 2023-11-04 DIAGNOSIS — R051 Acute cough: Secondary | ICD-10-CM | POA: Insufficient documentation

## 2023-11-04 DIAGNOSIS — C3431 Malignant neoplasm of lower lobe, right bronchus or lung: Secondary | ICD-10-CM

## 2023-11-04 MED ORDER — AZITHROMYCIN 500 MG PO TABS: 500.0000 mg | ORAL_TABLET | Freq: Every day | ORAL | 0 refills | Status: AC

## 2023-11-04 NOTE — Telephone Encounter (Signed)
 Chief Complaint: cough Symptoms: streaks of blood, yellow mucus Frequency: x few weeks Pertinent Negatives: Patient denies fever, CP, SOB, URI sx Disposition: [] ED /[] Urgent Care (no appt availability in office) / [x] Appointment(In office/virtual)/ []  [place] [facility]/ [] Home Care/ [] Refused Recommended Disposition /[] [place] [facility]/ []  Follow-up with PCP Additional Notes: Pt c/o coughing x few weeks and noticed blood streaked mucus this AM. Denies CP, SOB, fever, URI sx. Reports taking respiratory INH as prescribed and OTC cough syrup with some relief. Scheduled patient per protocol on 11/04/2023. Patient verbalized understanding and to call back with worsening symptoms.    Copied from CRM [id number]. Topic: Clinical - Red Word Triage >> Nov 04, 2023  9:12 AM Konrad Dolores wrote: Red Word that prompted transfer to Nurse Triage: Blood in mucus when coughing, coughing nonstop and possible fluid in lungs. Reason for Disposition  [1] Coughed up blood-tinged sputum AND [2] more than once  Answer Assessment - Initial Assessment Questions E2C2 Pulmonary Triage - Initial Assessment Questions "Chief Complaint (e.g., cough, sob, wheezing, fever, chills, sweat or additional symptoms) *Go to specific symptom protocol after initial questions. Hemoptysis - yellow mucus with streaks of blood x 2  "How long have symptoms been present?" This AM  Have you tested for COVID or Flu? Note: If not, ask patient if a home test can be taken. If so, instruct patient to call back for positive results. No  MEDICINES:   "Have you used any OTC meds to help with symptoms?" Yes If yes, ask "What medications?" Cough syrup DM - some relief  "Have you used your inhalers/maintenance medication?" Yes If yes, "What medications?" Wixela PRN - 1 a day, last used yesterday Reports having nebulizer PRN  If inhaler, ask "How many puffs and how often?" Note: Review instructions on medication in the chart. See  above  OXYGEN: "Do you wear supplemental oxygen?" No If yes, "How many liters are you supposed to use?" N/a  "Do you monitor your oxygen levels?" Yes If yes, "What is your reading (oxygen level) today?" 98% today HR 107 - just walked down stairs   "What is your usual oxygen saturation reading?"  (Note: Pulmonary O2 sats should be 90% or greater) High 90s   1. ONSET: "When did the cough begin?"      A few weeks 2. SEVERITY: "How bad is the cough today?"      Noticing streaks of blood 3. SPUTUM: "Describe the color of your sputum" (none, dry cough; clear, white, yellow, green)     Yellow with streaks of blood 4. HEMOPTYSIS: "Are you coughing up any blood?" If so ask: "How much?" (flecks, streaks, tablespoons, etc.)     streaks 5. DIFFICULTY BREATHING: "Are you having difficulty breathing?" If Yes, ask: "How bad is it?" (e.g., mild, moderate, severe)    - MILD: No SOB at rest, mild SOB with walking, speaks normally in sentences, can lie down, no retractions, pulse < 100.    - MODERATE: SOB at rest, SOB with minimal exertion and prefers to sit, cannot lie down flat, speaks in phrases, mild retractions, audible wheezing, pulse 100-120.    - SEVERE: Very SOB at rest, speaks in single words, struggling to breathe, sitting hunched forward, retractions, pulse > 120      denies 6. FEVER: "Do you have a fever?" If Yes, ask: "What is your temperature, how was it measured, and when did it start?"     denies 7. CARDIAC HISTORY: "Do you have any history of  heart disease?" (e.g., heart attack, congestive heart failure)      denies 8. LUNG HISTORY: "Do you have any history of lung disease?"  (e.g., pulmonary embolus, asthma, emphysema)     Asthma, lung cx 9. PE RISK FACTORS: "Do you have a history of blood clots?" (or: recent major surgery, recent prolonged travel, bedridden)     denies 10. OTHER SYMPTOMS: "Do you have any other symptoms?" (e.g., runny nose, wheezing, chest pain)        denies  Protocols used: Cough - Acute Productive-A-AH

## 2023-11-04 NOTE — Telephone Encounter (Signed)
 Noted. Nothing further needed.

## 2023-11-04 NOTE — Progress Notes (Signed)
 Subjective:    Patient ID: Katherine Perry, female    DOB: 02/03/1956, 68 y.o.   MRN: [id number]  Patient Care Team: Danella Penton, MD as PCP - General (Internal Medicine) Iran Ouch, MD as PCP - Cardiology (Cardiology) Glory Buff, RN as Oncology Nurse Navigator [name], MD as Consulting Physician (Pulmonary Disease) Creig Hines, MD as Consulting Physician (Oncology)  Chief Complaint  Patient presents with   Acute Visit    Cough x a few weeks. yellow/bloody mucus this morning. No shortness of breath or wheezing.    BACKGROUND:Patient is a 67 year old remote former smoker with a history of adenocarcinoma of the right lung and recurrent pleural effusions who presents as a ACUTE visit for evaluation of an acute cough with streaky hemoptysis. She has chemoradiation for her adenocarcinoma with resultant atelectasis of the right lower lobe and trapped lung on the right.   HPI Discussed the use of AI scribe software for clinical note transcription with the patient, who gave verbal consent to proceed.  History of Present Illness   ALLE DIFABIO "Katherine Perry" is a 68 year old female with history of adenocarcinoma of the right lung who presents with an acute cough.  She has been experiencing a cough for two to three weeks, which is productive of yellow mucus. She has noticed blood streaks in the mucus on two occasions this morning. No recent illness preceded the onset of the cough.  She uses an inhaler as needed, this is Wixela', and previously used Trelegy, which was more effective but costly. She has no known allergies to antibiotics.  Her last CT scan in January showed stable disease. She currently does not have follow-up appointments with her oncologist, as her condition had previously improved.  She has a chronic effusion on the right related to her lung adenocarcinoma she is developing fibrotic changes on that.  No recent illness prior to the onset of the cough and no known  allergies to antibiotics. No wheezing.  No chest pain.  No lower extremity edema.    Review of Systems A 10 point review of systems was performed and it is as noted above otherwise negative.   Past Medical History:  Diagnosis Date   Anxiety    Asthma    Cancer (HCC)    Cough    Dyspnea    with exertion   GERD (gastroesophageal reflux disease)    Hypertension    Lung cancer [facility])     Past Surgical History:  Procedure Laterality Date   BUNIONECTOMY Bilateral    COLONOSCOPY     COLONOSCOPY WITH PROPOFOL N/A 07/30/2021   Procedure: COLONOSCOPY WITH PROPOFOL;  Surgeon: Jaynie Collins, DO;  Location: [facility] ENDOSCOPY;  Service: Gastroenterology;  Laterality: N/A;   ESOPHAGOGASTRODUODENOSCOPY  04/01/2006   FLEXIBLE BRONCHOSCOPY Right 10/13/2022   Procedure: FLEXIBLE BRONCHOSCOPY;  Surgeon: [name], MD;  Location: [facility] ORS;  Service: Pulmonary;  Laterality: Right;   PORTA CATH INSERTION N/A 12/22/2020   Procedure: PORTA CATH INSERTION;  Surgeon: Annice Needy, MD;  Location: [facility] INVASIVE CV LAB;  Service: Cardiovascular;  Laterality: N/A;   PORTA CATH REMOVAL N/A 05/31/2022   Procedure: PORTA CATH REMOVAL;  Surgeon: Annice Needy, MD;  Location: [facility] INVASIVE CV LAB;  Service: Cardiovascular;  Laterality: N/A;   THORACENTESIS     VIDEO BRONCHOSCOPY WITH ENDOBRONCHIAL NAVIGATION N/A 11/07/2020   Procedure: VIDEO BRONCHOSCOPY WITH ENDOBRONCHIAL NAVIGATION;  Surgeon: Vida Rigger, MD;  Location: [facility] ORS;  Service: Thoracic;  Laterality: N/A;   VIDEO BRONCHOSCOPY WITH ENDOBRONCHIAL ULTRASOUND N/A 11/07/2020   Procedure: VIDEO BRONCHOSCOPY WITH ENDOBRONCHIAL ULTRASOUND;  Surgeon: Vida Rigger, MD;  Location: [facility] ORS;  Service: Thoracic;  Laterality: N/A;   VIDEO BRONCHOSCOPY WITH ENDOBRONCHIAL ULTRASOUND Right 10/13/2022   Procedure: VIDEO BRONCHOSCOPY WITH ENDOBRONCHIAL ULTRASOUND;  Surgeon: [name], MD;  Location: [facility] ORS;  Service: Pulmonary;  Laterality: Right;     Patient Active Problem List   Diagnosis Date Noted   Pleural effusion 11/05/2022   Paroxysmal atrial flutter w/ RVR 11/04/2022   Pericardial effusion 11/04/2022   Anxiety 11/04/2022   HTN (hypertension) 11/04/2022   Recurrent pleural effusion on right 07/20/2022   Asthma 07/20/2022   Sinus tachycardia 07/20/2022   Pneumothorax 07/19/2022   Primary lung adenocarcinoma, right (HCC) 09/17/2021   Chemotherapy induced neutropenia (HCC) 01/26/2021   Malignant neoplasm of lower lobe of right lung (HCC) 11/23/2020   Chronic hyponatremia 01/16/2020   Laryngopharyngeal reflux (LPR) 10/18/2019   Family history of colon cancer 09/11/2018   Tubular adenoma 09/11/2018   B12 deficiency 09/07/2016   Benign essential hypertension 07/05/2016   Mild reactive airways disease 08/29/2014   Lumbar disc disease 12/13/2013    Family History  Problem Relation Age of Onset   Heart attack Father    Heart disease Father    Heart disease Sister    Breast cancer Neg Hx     Social History   Tobacco Use   Smoking status: Former    Current packs/day: 0.00    Average packs/day: 1.5 packs/day for 26.8 years (40.2 ttl pk-yrs)    Types: Cigarettes    Start date: 01/19/1968    Quit date: 11/07/1994    Years since quitting: 29.0   Smokeless tobacco: Never  Substance Use Topics   Alcohol use: Yes    Comment:  wine daily    Allergies  Allergen Reactions   Ace Inhibitors Cough    Current Meds  Medication Sig   acetaminophen (TYLENOL) 500 MG tablet Take 500 mg by mouth every 6 (six) hours as needed.   ALPRAZolam (XANAX) 0.5 MG tablet Take 0.5 mg by mouth at bedtime as needed for sleep.   aspirin EC 81 MG tablet Take 1 tablet (81 mg total) by mouth daily. Swallow whole.   azelastine (ASTELIN) 0.1 % nasal spray Place 2 sprays into both nostrils 2 (two) times daily. Use in each nostril as directed   azithromycin (ZITHROMAX) 500 MG tablet Take 1 tablet (500 mg total) by mouth daily for 3 days.    Cholecalciferol 50 MCG (2000 UT) CAPS Take 2,000 Units by mouth daily.   Cyanocobalamin (B-12 COMPLIANCE INJECTION IJ) Inject 1,000 mcg as directed. Once a month   Cyanocobalamin (B-12 PO) Take by mouth daily at 6 (six) AM.   esomeprazole (NEXIUM) 20 MG capsule Take 20 mg by mouth as needed.   fluticasone-salmeterol (WIXELA INHUB) 100-50 MCG/ACT AEPB Inhale 1 puff into the lungs as needed.   folic acid (FOLVITE) 1 MG tablet TAKE 1 TABLET BY MOUTH EVERY DAY   metoprolol succinate (TOPROL-XL) 50 MG 24 hr tablet TAKE 1 TABLET BY MOUTH EVERY DAY   spironolactone (ALDACTONE) 25 MG tablet Take 25 mg by mouth daily.    Immunization History  Administered Date(s) Administered   Fluad Trivalent(High Dose 65+) 04/12/2023   Influenza,inj,Quad PF,6+ Mos 05/08/2019, 05/13/2021, 05/11/2022   Influenza-Unspecified 05/02/2017, 05/20/2020   PFIZER Comirnaty(Gray Top)Covid-19 Tri-Sucrose Vaccine 10/19/2019, 11/16/2019, 05/16/2020   PFIZER(Purple Top)SARS-COV-2 Vaccination 10/19/2019, 11/16/2019, 05/16/2020  Pneumococcal Polysaccharide-23 05/19/2021        Objective:     BP 136/84 (BP Location: Left Arm, Patient Position: Sitting, Cuff Size: Normal)   Pulse 95   Temp 97.6 F (36.4 C) (Temporal)   Ht 5\' 5"  (1.651 m)   Wt 138 lb 9.6 oz (62.9 kg)   SpO2 99%   BMI 23.06 kg/m   SpO2: 99 %  GENERAL: Well-developed, well-nourished woman, no acute distress.  Fully ambulatory.  No conversational dyspnea.  Nontoxic-appearing. HEAD: Normocephalic, atraumatic.  EYES: Pupils equal, round, reactive to light.  No scleral icterus.  MOUTH: Teeth intact, oral mucosa moist.  No thrush. NECK: Supple. No thyromegaly. Trachea midline. No JVD.  No adenopathy. PULMONARY: Good air entry on left, diminished breath sounds on right midlung field to base.  Coarse, otherwise, no adventitious sounds. CARDIOVASCULAR: S1 and S2.  Regular rate and rhythm, no rubs murmurs or gallops heard.  ABDOMEN: Benign. MUSCULOSKELETAL:  No joint deformity, no clubbing, no edema.  NEUROLOGIC: No overt focal deficit, no gait disturbance, speech is fluent. SKIN: Intact,warm,dry. PSYCH: Mood and behavior normal.  Chest x-ray was obtained today, independently reviewed, unchanged atelectasis/effusion on the right, when compared to prior.  Nothing acute:     Assessment & Plan:     ICD-10-CM   1. Acute bronchitis with COPD (HCC)  J44.0 DG Chest 2 View   J20.9     2. Acute cough  R05.1 DG Chest 2 View    3. Malignant neoplasm of lower lobe of right lung (HCC)  C34.31 DG Chest 2 View      Orders Placed This Encounter  Procedures   DG Chest 2 View    Standing Status:   Future    Number of Occurrences:   1    Expected Date:   11/04/2023    Expiration Date:   11/03/2024    Reason for Exam (SYMPTOM  OR DIAGNOSIS REQUIRED):   Cough,history of lung Ca    Preferred imaging location?:   [facility]    Meds ordered this encounter  Medications   azithromycin (ZITHROMAX) 500 MG tablet    Sig: Take 1 tablet (500 mg total) by mouth daily for 3 days.    Dispense:  3 tablet    Refill:  0   Discussion:    Tracheobronchitis Acute tracheobronchitis characterized by a 2-3 week history of productive cough with yellow mucus and streaky hemoptysis. Inflammation of the trachea and bronchial tubes likely causing a burning sensation. Absence of wheezing suggests no need for corticosteroids. Antibiotics prescribed for infection,a 3-day course of azithromycin 500 mg. - Prescribe a 3-day course of antibiotics (azithromycin 500 mg daily x 3 days). - Advise using inhaler twice daily to alleviate symptoms.  Use rescue inhaler as needed. - Recommend over-the-counter Mucinex DM to break up mucus and alleviate cough.  Adenocarcinoma of the lung Adenocarcinoma of the lung with last CT in January indicating well-managed condition.  Follows with Dr. Smith Robert in June. - Schedule follow-up appointment in 3 weeks with Dr.Dgayli.     Advised if  symptoms do not improve or worsen, to please contact office for sooner follow up or seek emergency care.    I spent 32 minutes of dedicated to the care of this patient on the date of this encounter to include pre-visit review of records, face-to-face time with the patient discussing conditions above, post visit ordering of testing, clinical documentation with the electronic health record, making appropriate referrals as documented, and communicating necessary  findings to members of the patients care team.   C. Danice Goltz, MD Advanced Bronchoscopy PCCM [facility]    *This note was dictated using voice recognition software/Dragon.  Despite best efforts to proofread, errors can occur which can change the meaning. Any transcriptional errors that result from this process are unintentional and may not be fully corrected at the time of dictation.

## 2023-11-04 NOTE — Patient Instructions (Addendum)
 VISIT SUMMARY:  During your visit, we discussed your recent cough and reviewed your history of adenocarcinoma. You have been experiencing a productive cough with yellow mucus and occasional blood streaks for the past two to three weeks. We also reviewed your last CT scan from January, which showed stable disease.  YOUR PLAN:  -TRACHEOBRONCHITIS: Tracheobronchitis is an inflammation of the trachea and bronchial tubes, causing a productive cough with yellow mucus and sometimes blood streaks. You will take a 3-day course of antibiotics to treat the infection. Use your inhaler twice daily to help with symptoms, and you can take over-the-counter Mucinex DM (extra strength) to break up the mucus and alleviate your cough.  We will get a chest x-ray to make sure all is stable.  -ADENOCARCINOMA OF THE LUNG: Adenocarcinoma of the lung is a type of cancer that forms in mucus-secreting glands. Your last CT scan in January showed that your condition is stable. We will schedule a follow-up appointment in a couple of weeks to reassess your condition.  INSTRUCTIONS:  Please schedule a follow-up appointment in a couple of weeks with Dr. Aundria Rud for reassessment, to ensure you are doing well. Continue using your inhaler twice daily and take the prescribed antibiotics for 3 days. You can also use over-the-counter Mucinex DM to help with your cough.

## 2023-12-07 ENCOUNTER — Ambulatory Visit
Admission: RE | Admit: 2023-12-07 | Discharge: 2023-12-07 | Disposition: A | Discharge status: Home / Self Care | Source: Ambulatory Visit | Attending: Student in an Organized Health Care Education/Training Program | Admitting: Student in an Organized Health Care Education/Training Program

## 2023-12-07 ENCOUNTER — Encounter: Payer: Self-pay | Admitting: Student in an Organized Health Care Education/Training Program

## 2023-12-07 ENCOUNTER — Ambulatory Visit: Admitting: Student in an Organized Health Care Education/Training Program

## 2023-12-07 VITALS — BP 130/66 | HR 88 | Ht 65.0 in | Wt 140.0 lb

## 2023-12-07 DIAGNOSIS — J90 Pleural effusion, not elsewhere classified: Secondary | ICD-10-CM | POA: Insufficient documentation

## 2023-12-07 DIAGNOSIS — C3431 Malignant neoplasm of lower lobe, right bronchus or lung: Secondary | ICD-10-CM

## 2023-12-07 NOTE — Progress Notes (Signed)
 Assessment & Plan:   #Recurrent pleural effusion on right (Primary) #Stage III NSCLCa #Pneumothorax Ex-Vacuo - resolved  Pleasant 68 year old female with history of recurrent right sided pleural effusion in the setting of NSCLCa treated with chemoradiation presenting for follow up.  Symptoms have been stable and US  of the right pleural space shows a small residual effusion with loculations and septations, suggesting pleurodesis has been achieve d. Her last thoracentesis 4 months ago only yielded 300 mL, and I am hesitant to attribute current mild symptoms to an enlarging effusion. I will obtain a CXR today and compare it to the ones prior, and if there is stability we will hold off on further pleural intervention at this point. She does have a trapped right lower lobe which will not re-expand given her history of chemoradiation. The pleural fluid cytology has remained negative and chemistries are consistent with an exudate (highly elevated pleural fluid cholesterol). Pleural fluid triglycerides are normal and are inconsistent with a chylothorax.   - DG Chest 2 View > personally reviewed and stable compared to prior - will review repeat CT on follow up visit.  Return in about 2 months (around 02/06/2024).  I spent 31 minutes caring for this patient today, including preparing to see the patient, obtaining a medical history , reviewing a separately obtained history, performing a medically appropriate examination and/or evaluation, counseling and educating the patient/family/caregiver, ordering medications, tests, or procedures, documenting clinical information in the electronic health record, and independently interpreting results (not separately reported/billed) and communicating results to the patient/family/caregiver  Katherine Glasgow, MD [facility]  End of visit medications:  No orders of the defined types were placed in this encounter.    Current Outpatient  Medications:    acetaminophen  (TYLENOL ) 500 MG tablet, Take 500 mg by mouth every 6 (six) hours as needed., Disp: , Rfl:    ALPRAZolam  (XANAX ) 0.5 MG tablet, Take 0.5 mg by mouth at bedtime as needed for sleep., Disp: , Rfl:    aspirin  EC 81 MG tablet, Take 1 tablet (81 mg total) by mouth daily. Swallow whole., Disp: , Rfl:    azelastine  (ASTELIN ) 0.1 % nasal spray, Place 2 sprays into both nostrils 2 (two) times daily. Use in each nostril as directed, Disp: 30 mL, Rfl: 2   Cholecalciferol  50 MCG (2000 UT) CAPS, Take 2,000 Units by mouth daily., Disp: , Rfl:    Cyanocobalamin (B-12 COMPLIANCE INJECTION IJ), Inject 1,000 mcg as directed. Once a month, Disp: , Rfl:    Cyanocobalamin (B-12 PO), Take by mouth daily at 6 (six) AM., Disp: , Rfl:    esomeprazole (NEXIUM) 20 MG capsule, Take 20 mg by mouth as needed., Disp: , Rfl:    fluticasone -salmeterol (WIXELA INHUB) 100-50 MCG/ACT AEPB, Inhale 1 puff into the lungs as needed., Disp: , Rfl:    folic acid  (FOLVITE ) 1 MG tablet, TAKE 1 TABLET BY MOUTH EVERY DAY, Disp: 90 tablet, Rfl: 3   metoprolol  succinate (TOPROL -XL) 50 MG 24 hr tablet, TAKE 1 TABLET BY MOUTH EVERY DAY, Disp: 90 tablet, Rfl: 1   spironolactone  (ALDACTONE ) 25 MG tablet, Take 25 mg by mouth daily., Disp: , Rfl:    HYDROcodone  bit-homatropine (HYCODAN) 5-1.5 MG/5ML syrup, Take 5 mLs by mouth every 6 (six) hours as needed for cough. (Patient not taking: Reported on 11/04/2023), Disp: 120 mL, Rfl: 0   montelukast  (SINGULAIR ) 10 MG tablet, Take 1 tablet by mouth at bedtime., Disp: , Rfl:    zolpidem  (  AMBIEN ) 5 MG tablet, Take 0.5 tablets by mouth at bedtime as needed., Disp: , Rfl:  No current facility-administered medications for this visit.  Facility-Administered Medications Ordered in Other Visits:    heparin  lock flush 100 UNIT/ML injection, , , ,    heparin  lock flush 100 UNIT/ML injection, , , ,    heparin  lock flush 100 UNIT/ML injection, , , ,    heparin  lock flush 100 UNIT/ML  injection, , , ,    heparin  lock flush 100 unit/mL, 500 Units, Intravenous, Once, Avonne Boettcher, MD   sodium chloride  flush (NS) 0.9 % injection 10 mL, 10 mL, Intravenous, Once, Avonne Boettcher, MD   sodium chloride  flush (NS) 0.9 % injection 10 mL, 10 mL, Intravenous, PRN, Avonne Boettcher, MD, 10 mL at 01/26/21 0807   sodium chloride  flush (NS) 0.9 % injection 10 mL, 10 mL, Intravenous, Once, Avonne Boettcher, MD   Subjective:   PATIENT ID: Katherine Perry GENDER: female DOB: 1955/10/21, MRN: [id number]  Chief Complaint  Patient presents with   Follow-up    Cough with green phlegm. Wheezing. No shortness of breath.     HPI  [name] is a pleasant 68 year old female with history of non-small cell lung cancer s/p chemo-radiation with a recurrent pleural effusion who is presenting to clinic for follow up.  She has felt well overall but has had some recurrence of her cough. She did present to our office for an acute visit in April of 2025 with increased shortness of breath and cough with streaky hemoptysis, treated for bronchitis with a course of antibiotics and inhaler, but without steroids. She continues to be short of breath with significant exertion (especially going up an inclined path). She was recently in [place]  with friends and felt ok. Her last thoracentesis was in January where 300 mL were drained with minimal improvement in symptoms. She is no longer taking Eliquis  for her Afib.  She had developed a recurrent right-sided pleural effusion status post multiple thoracentesis. Last thoracentesis was in January of 2025 with only 300 mL drained. Patient had previously had pneumothorax ex-vacuo following thoracentesis with IR.    The pleural fluid had been exudative but cytology remained negative. She was diagnosed with lung cancer in April 2022 (biopsy at that time of the right lower lobe showed non-small cell lung cancer consistent with adenocarcinoma) and received chemoradiation in  coordination with oncology and radiation oncology. She underwent thoracentesis in June 2023 (650 mL), September 2023 (1 L), October 2023 (1.2 L), and December 2023 (1.5 L), January 2024 (1.2 L), February 2024 (1 L), March 2024 (two times, on the 13th and 28th), April (4th and 16th), and May 16 (400 mL), January 20, 2023 (500 mL), and March 01, 2023 (400 mL).  The pleural fluid was exudative by lights criteria (LDH and protein sent) and cytology has been negative. She did undergo EBUS in March of 2024 where the RLL consolidation and station 11R superior were biopsied without any finding of malignancy.   She was found to have a post procedural pneumothorax following December 2023's thoracentesis for which she required a visit to the ED and a quick admission for observation. She was admitted in April to the hospital for Afib with RVR, and was found to have a small to moderate pericardial effusion. She was seen by cardiology, continued on Eliquis , and repeat echo was ordered for follow up of the pericardial effusion which showed it to be resolved.  POCUS of the Right Effusion today      Indication: persistent right sided pleural effusion  We performed ultrasound of Right Posterolateral hemithorax,for assessment of presence, volume and sonographic appearance of pleural fluid and sonographic apperance of lung in preparation for sampling of pleural fluid/insertion of catheter into pleural space.  Relevant sonographic findings:  Small amount of pleural fluid noted on isonation. Sonographic appearance of fluid was loculated. Visualized lung parenchyma was noted to be restricted in respiratory movement.  Impression:  Small pleural effusion that is complex in nature.  Further sonographic monitoring planned. CXR ordered    Ancillary information including prior medications, full medical/surgical/family/social histories, and PFTs (when available) are listed below and have been reviewed.   Review of Systems   Constitutional:  Negative for chills and fever.  Respiratory:  Positive for shortness of breath (with significant exertion). Negative for cough, sputum production and wheezing.   Cardiovascular:  Negative for chest pain.  Skin:  Negative for rash.     Objective:   Vitals:   12/07/23 1357  BP: 130/66  Pulse: 88  TempSrc: Temporal  SpO2: 100%  Weight: 140 lb (63.5 kg)  Height: 5\' 5"  (1.651 m)   100% on RA BMI Readings from Last 3 Encounters:  12/07/23 23.30 kg/m  11/04/23 23.06 kg/m  08/15/23 23.46 kg/m   Wt Readings from Last 3 Encounters:  12/07/23 140 lb (63.5 kg)  11/04/23 138 lb 9.6 oz (62.9 kg)  08/15/23 141 lb (64 kg)    Physical Exam Constitutional:      Appearance: Normal appearance. She is not ill-appearing.  HENT:     Head: Normocephalic.     Mouth/Throat:     Mouth: Mucous membranes are moist.  Cardiovascular:     Rate and Rhythm: Normal rate and regular rhythm.     Heart sounds: Normal heart sounds.  Pulmonary:     Comments: Decreased breath sounds over the right lower lung field. Good air entry over the left. No wheezing noted. Neurological:     General: No focal deficit present.     Mental Status: She is alert and oriented to person, place, and time. Mental status is at baseline.       Ancillary Information    Past Medical History:  Diagnosis Date   Anxiety    Asthma    Cancer (HCC)    Cough    Dyspnea    with exertion   GERD (gastroesophageal reflux disease)    Hypertension    Lung cancer (HCC)      Family History  Problem Relation Age of Onset   Heart attack Father    Heart disease Father    Heart disease Sister    Breast cancer Neg Hx      Past Surgical History:  Procedure Laterality Date   BUNIONECTOMY Bilateral    COLONOSCOPY     COLONOSCOPY WITH PROPOFOL  N/A 07/30/2021   Procedure: COLONOSCOPY WITH PROPOFOL ;  Surgeon: Quintin Buckle, DO;  [facility] END[facility]COPY;  Service: Gastroenterology;  Laterality:  N/A;   ESOPHAGOGASTRODUODENOSCOPY  04/01/2006   FLEXIBLE BRONCHOSCOPY Right 10/13/2022   Procedure: FLEXIBLE BRONCHOSCOPY;  Surgeon: Katherine Glasgow, MD;  Location: [facility] ORS;  Service: Pulmonary;  Laterality: Right;   PORTA CATH INSERTION N/A 12/22/2020   Procedure: PORTA CATH INSERTION;  Surgeon: Celso College, MD;  Location: [facility] INVASIVE CV LAB;  Service: Cardiovascular;  Laterality: N/A;   PORTA CATH REMOVAL N/A 05/31/2022   Procedure: PORTA CATH REMOVAL;  Surgeon: Mikki Alexander  S, MD;  Location: [facility] INVASIVE CV LAB;  Service: Cardiovascular;  Laterality: N/A;   THORACENTESIS     VIDEO BRONCHOSCOPY WITH ENDOBRONCHIAL NAVIGATION N/A 11/07/2020   Procedure: VIDEO BRONCHOSCOPY WITH ENDOBRONCHIAL NAVIGATION;  Surgeon: Erskin Hearing, MD;  Location: [facility] ORS;  Service: Thoracic;  Laterality: N/A;   VIDEO BRONCHOSCOPY WITH ENDOBRONCHIAL ULTRASOUND N/A 11/07/2020   Procedure: VIDEO BRONCHOSCOPY WITH ENDOBRONCHIAL ULTRASOUND;  Surgeon: Erskin Hearing, MD;  Location: [facility] ORS;  Service: Thoracic;  Laterality: N/A;   VIDEO BRONCHOSCOPY WITH ENDOBRONCHIAL ULTRASOUND Right 10/13/2022   Procedure: VIDEO BRONCHOSCOPY WITH ENDOBRONCHIAL ULTRASOUND;  Surgeon: Katherine Glasgow, MD;  Location: [facility] ORS;  Service: Pulmonary;  Laterality: Right;    Social History   Socioeconomic History   Marital status: Married    Spouse name: Not on file   Number of children: Not on file   Years of education: Not on file   Highest education level: Not on file  Occupational History   Not on file  Tobacco Use   Smoking status: Former    Current packs/day: 0.00    Average packs/day: 1.5 packs/day for 26.8 years (40.2 ttl pk-yrs)    Types: Cigarettes    Start date: 01/19/1968    Quit date: 11/07/1994    Years since quitting: 29.1   Smokeless tobacco: Never  Vaping Use   Vaping status: Never Used  Substance and Sexual Activity   Alcohol use: Yes    Comment:  wine daily   Drug use: Never   Sexual activity: Yes  Other  Topics Concern   Not on file  Social History Narrative   Lives with husband, Marlou Sims, no pets.   Social Drivers of Corporate investment banker Strain: Low Risk  (10/06/2023)   Received from [facility]   Overall Financial Resource Strain (CARDIA)    Difficulty of Paying Living Expenses: Not hard at all  Food Insecurity: No Food Insecurity (10/06/2023)   Received from [facility]   Hunger Vital Sign    Worried About Running Out of Food in the Last Year: Never true    Ran Out of Food in the Last Year: Never true  Transportation Needs: No Transportation Needs (10/06/2023)   Received from [facility] - Transportation    In the past 12 months, has lack of transportation kept you from medical appointments or from getting medications?: No    Lack of Transportation (Non-Medical): No  Physical Activity: Not on file  Stress: Not on file  Social Connections: Not on file  Intimate Partner Violence: Not At Risk (11/05/2022)   Humiliation, Afraid, Rape, and Kick questionnaire    Fear of Current or Ex-Partner: No    Emotionally Abused: No    Physically Abused: No    Sexually Abused: No     Allergies  Allergen Reactions   Ace Inhibitors Cough     CBC    Component Value Date/Time   WBC 5.6 11/22/2022 1019   RBC 3.64 (L) 11/22/2022 1019   HGB 11.6 (L) 11/22/2022 1019   HCT 35.1 (L) 11/22/2022 1019   PLT 527 (H) 11/22/2022 1019   MCV 96.4 11/22/2022 1019   MCH 31.9 11/22/2022 1019   MCHC 33.0 11/22/2022 1019   RDW 12.2 11/22/2022 1019   LYMPHSABS 0.9 11/22/2022 1019   MONOABS 0.6 11/22/2022 1019   EOSABS 0.0 11/22/2022 1019   BASOSABS 0.0 11/22/2022 1019    Pulmonary Functions Testing Results:  No data to display          Outpatient Medications Prior to Visit  Medication Sig Dispense Refill   acetaminophen  (TYLENOL ) 500 MG tablet Take 500 mg by mouth every 6 (six) hours as needed.     ALPRAZolam  (XANAX ) 0.5 MG  tablet Take 0.5 mg by mouth at bedtime as needed for sleep.     aspirin  EC 81 MG tablet Take 1 tablet (81 mg total) by mouth daily. Swallow whole.     azelastine  (ASTELIN ) 0.1 % nasal spray Place 2 sprays into both nostrils 2 (two) times daily. Use in each nostril as directed 30 mL 2   Cholecalciferol  50 MCG (2000 UT) CAPS Take 2,000 Units by mouth daily.     Cyanocobalamin (B-12 COMPLIANCE INJECTION IJ) Inject 1,000 mcg as directed. Once a month     Cyanocobalamin (B-12 PO) Take by mouth daily at 6 (six) AM.     esomeprazole (NEXIUM) 20 MG capsule Take 20 mg by mouth as needed.     fluticasone -salmeterol (WIXELA INHUB) 100-50 MCG/ACT AEPB Inhale 1 puff into the lungs as needed.     folic acid  (FOLVITE ) 1 MG tablet TAKE 1 TABLET BY MOUTH EVERY DAY 90 tablet 3   metoprolol  succinate (TOPROL -XL) 50 MG 24 hr tablet TAKE 1 TABLET BY MOUTH EVERY DAY 90 tablet 1   spironolactone  (ALDACTONE ) 25 MG tablet Take 25 mg by mouth daily.     HYDROcodone  bit-homatropine (HYCODAN) 5-1.5 MG/5ML syrup Take 5 mLs by mouth every 6 (six) hours as needed for cough. (Patient not taking: Reported on 11/04/2023) 120 mL 0   montelukast  (SINGULAIR ) 10 MG tablet Take 1 tablet by mouth at bedtime.     zolpidem  (AMBIEN ) 5 MG tablet Take 0.5 tablets by mouth at bedtime as needed.     Facility-Administered Medications Prior to Visit  Medication Dose Route Frequency Provider Last Rate Last Admin   heparin  lock flush 100 UNIT/ML injection            heparin  lock flush 100 UNIT/ML injection            heparin  lock flush 100 UNIT/ML injection            heparin  lock flush 100 UNIT/ML injection            heparin  lock flush 100 unit/mL  500 Units Intravenous Once Rao, Archana C, MD       sodium chloride  flush (NS) 0.9 % injection 10 mL  10 mL Intravenous Once Rao, Archana C, MD       sodium chloride  flush (NS) 0.9 % injection 10 mL  10 mL Intravenous PRN Rao, Archana C, MD   10 mL at 01/26/21 0807   sodium chloride  flush (NS) 0.9  % injection 10 mL  10 mL Intravenous Once Rao, Archana C, MD

## 2023-12-27 ENCOUNTER — Telehealth: Payer: Self-pay

## 2023-12-27 DIAGNOSIS — R051 Acute cough: Secondary | ICD-10-CM

## 2023-12-27 DIAGNOSIS — R0602 Shortness of breath: Secondary | ICD-10-CM

## 2023-12-27 DIAGNOSIS — J90 Pleural effusion, not elsewhere classified: Secondary | ICD-10-CM

## 2023-12-27 NOTE — Telephone Encounter (Signed)
 Copied from CRM [id number]. Topic: Clinical - Request for Lab/Test Order >> Dec 27, 2023  8:17 AM Hilton Lucky wrote: Reason for CRM: Patient states [place] [facility] [place] to have fluid drawn off her lungs? Please advise.

## 2023-12-27 NOTE — Telephone Encounter (Signed)
 Copied from CRM [id number]. Topic: Clinical - Request for Lab/Test Order >> Dec 27, 2023  8:17 AM Hilton Lucky wrote: Reason for CRM: Patient states [place] [facility] [place] to have fluid drawn off her lungs? Please advise. >> Dec 27, 2023 10:39 AM Ilene Malling wrote: Patient [phone] states missed a call from Dr. Para Bold nurse regarding fluids withdrawal in lungs. Per CAL, the nurse are not available send high priority message. Patient verbalized understanding and will wait for the call back.

## 2023-12-29 ENCOUNTER — Other Ambulatory Visit: Payer: Self-pay | Admitting: Student in an Organized Health Care Education/Training Program

## 2023-12-29 ENCOUNTER — Other Ambulatory Visit: Payer: Self-pay | Admitting: Radiology

## 2023-12-29 ENCOUNTER — Ambulatory Visit
Admission: RE | Admit: 2023-12-29 | Discharge: 2023-12-29 | Disposition: A | Discharge status: Home / Self Care | Source: Ambulatory Visit | Attending: Student in an Organized Health Care Education/Training Program | Admitting: Student in an Organized Health Care Education/Training Program

## 2023-12-29 DIAGNOSIS — J90 Pleural effusion, not elsewhere classified: Secondary | ICD-10-CM

## 2023-12-29 DIAGNOSIS — Z9889 Other specified postprocedural states: Secondary | ICD-10-CM

## 2023-12-29 DIAGNOSIS — R0602 Shortness of breath: Secondary | ICD-10-CM

## 2023-12-29 DIAGNOSIS — R051 Acute cough: Secondary | ICD-10-CM | POA: Diagnosis present

## 2023-12-29 NOTE — Procedures (Signed)
 Patient presents for a therapeutic thoracentesis. US  limited chest shows loculated pleural fluid in the right pleural space. After discussion about risks vs benefits, including bleeding, damage to adjacent structures, pneumothorax and insufficient therapeutic relief, patient declined procedure today.

## 2024-01-06 ENCOUNTER — Encounter: Payer: Self-pay | Admitting: Cardiovascular Disease

## 2024-01-13 ENCOUNTER — Ambulatory Visit
Admission: RE | Admit: 2024-01-13 | Discharge: 2024-01-13 | Disposition: A | Discharge status: Home / Self Care | Payer: Medicare Other | Source: Ambulatory Visit | Attending: Oncology

## 2024-01-13 DIAGNOSIS — C3431 Malignant neoplasm of lower lobe, right bronchus or lung: Secondary | ICD-10-CM | POA: Diagnosis present

## 2024-01-13 DIAGNOSIS — Z08 Encounter for follow-up examination after completed treatment for malignant neoplasm: Secondary | ICD-10-CM | POA: Diagnosis present

## 2024-01-13 DIAGNOSIS — Z85118 Personal history of other malignant neoplasm of bronchus and lung: Secondary | ICD-10-CM | POA: Insufficient documentation

## 2024-01-13 MED ORDER — IOHEXOL 300 MG/ML  SOLN
85.0000 mL | Freq: Once | INTRAMUSCULAR | Status: AC | PRN
  Administered 2024-01-13: 85 mL via INTRAVENOUS

## 2024-01-16 ENCOUNTER — Ambulatory Visit: Payer: Self-pay | Admitting: Oncology

## 2024-01-16 DIAGNOSIS — C3431 Malignant neoplasm of lower lobe, right bronchus or lung: Secondary | ICD-10-CM

## 2024-01-16 NOTE — Telephone Encounter (Signed)
 Pt is aware of results and MD recommendation to follow up with thoracic spine MRI. Scheduling notified and will contact pt with appt for MRI. Pt awaiting call from scheduling at this time.

## 2024-01-18 ENCOUNTER — Ambulatory Visit
Admission: RE | Admit: 2024-01-18 | Discharge: 2024-01-18 | Disposition: A | Discharge status: Home / Self Care | Source: Ambulatory Visit | Attending: Oncology | Admitting: Oncology

## 2024-01-18 DIAGNOSIS — C3431 Malignant neoplasm of lower lobe, right bronchus or lung: Secondary | ICD-10-CM | POA: Insufficient documentation

## 2024-01-18 MED ORDER — GADOBUTROL 1 MMOL/ML IV SOLN
6.0000 mL | Freq: Once | INTRAVENOUS | Status: AC | PRN
  Administered 2024-01-18: 6 mL via INTRAVENOUS

## 2024-01-27 ENCOUNTER — Inpatient Hospital Stay: Payer: Medicare Other | Attending: Oncology | Admitting: Oncology

## 2024-01-27 ENCOUNTER — Encounter: Payer: Self-pay | Admitting: Oncology

## 2024-01-27 VITALS — BP 143/80 | HR 108 | Temp 98.6°F | Resp 18 | Ht 65.0 in | Wt 144.0 lb

## 2024-01-27 DIAGNOSIS — C3431 Malignant neoplasm of lower lobe, right bronchus or lung: Secondary | ICD-10-CM

## 2024-01-27 DIAGNOSIS — Z08 Encounter for follow-up examination after completed treatment for malignant neoplasm: Secondary | ICD-10-CM | POA: Diagnosis not present

## 2024-01-27 DIAGNOSIS — R937 Abnormal findings on diagnostic imaging of other parts of musculoskeletal system: Secondary | ICD-10-CM | POA: Diagnosis not present

## 2024-01-27 DIAGNOSIS — Z9221 Personal history of antineoplastic chemotherapy: Secondary | ICD-10-CM | POA: Insufficient documentation

## 2024-01-27 DIAGNOSIS — Z85118 Personal history of other malignant neoplasm of bronchus and lung: Secondary | ICD-10-CM | POA: Insufficient documentation

## 2024-01-27 DIAGNOSIS — M8008XA Age-related osteoporosis with current pathological fracture, vertebra(e), initial encounter for fracture: Secondary | ICD-10-CM | POA: Diagnosis present

## 2024-01-27 DIAGNOSIS — M810 Age-related osteoporosis without current pathological fracture: Secondary | ICD-10-CM | POA: Insufficient documentation

## 2024-01-27 NOTE — Progress Notes (Signed)
 Survivorship Care Plan visit completed.  Treatment summary reviewed and given to patient.  ASCO answers booklet reviewed and given to patient.  CARE program and Cancer Transitions discussed with patient along with other resources cancer center offers to patients and caregivers.  Patient verbalized understanding.

## 2024-01-27 NOTE — Progress Notes (Signed)
 Hematology/Oncology Consult note [facility]  Telephone:([phone] [phone]  Patient Care Team: Cleotilde Oneil FALCON, MD as PCP - General (Internal Medicine) Darron Deatrice LABOR, MD as PCP - Cardiology (Cardiology) Verdene Gills, RN as Oncology Nurse Navigator Isadora Hose, MD as Consulting Physician (Pulmonary Disease) Melanee Annah BROCKS, MD as Consulting Physician (Oncology) Lenn Aran, MD as Consulting Physician (Radiation Oncology)   Name of the patient: Katherine Perry  [id number]  06-02-1956   Date of visit: 01/27/24  Diagnosis-  history of stage III right lung cancer in June 2022 currently in remission   Chief complaint/ Reason for visit-discuss CT and MRI results and further management  Heme/Onc history: patient is a 68 year old female with a past medical history significant for 1-1/2 pack/day smoking for about 20 years.  She quit smoking about 26 years ago.  Other medical problems include hypertension and hyponatremia.She has been treated for iron of possible pneumonia for the last 1 year.  She has a history of intermittent asthma for which she uses Advair.  She was seen by Dr. And underwent CT chest which showed dense infiltrate/solid mass in the right lower lobe with contiguous airspace opacity in the right upper lobe.  Findings could be secondary to pneumonia but concerning for bronchogenic carcinoma.  Patient then had a PET CT scan which showed consolidation in the medial aspect of the right lower lobe measuring 3.4 x 4 cm with an SUV of 11.6.  Hypermetabolic masslike thickening in the right hilum measuring 2.2 cm with intense hypermetabolic activity.  Groundglass airspace consolidation in the posterior aspect of the right upper lobe with an SUV of 11.4.  The right upper lobe opacity was nonspecific and differentials include pulmonary infection versus lymphangitic spread of carcinoma.  CT chest showed showed masslike area of distortion involving right  lower lobe middle lobe as well as right upper lobe.  Generalized right hilar masslike appearance concerning for nodal involvement.   Patient underwent bronchoscopy and biopsies.Right lower lobe was concerning for adenocarcinoma.  Lymph node station 11 R, 4R, 7 were negative for malignancy.  No malignant cells identified in the right upper lobe.  Case was discussed at tumor board and given the hypermetabolism in the right upper lobe as well as hilum involvement cannot be ruled out despite negative biopsies.  Patient completed concurrent chemoradiation with weekly CarboTaxol in July 2022.  Scan showed partial response.  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  Cytology negative for malignancy so far    Interval history-patient has symptoms of chronic cough which come and go.  She does report occasional mid back pain when she sits for a long time.  Denies other complaints at this time  ECOG PS- 1 Pain scale- 0   Review of systems- Review of Systems  Constitutional:  Negative for chills, fever, malaise/fatigue and weight loss.  HENT:  Negative for congestion, ear discharge and nosebleeds.   Eyes:  Negative for blurred vision.  Respiratory:  Positive for cough. Negative for hemoptysis, sputum production, shortness of breath and wheezing.   Cardiovascular:  Negative for chest pain, palpitations, orthopnea and claudication.  Gastrointestinal:  Negative for abdominal pain, blood in stool, constipation, diarrhea, heartburn, melena, nausea and vomiting.  Genitourinary:  Negative for dysuria, flank pain, frequency, hematuria and urgency.  Musculoskeletal:  Negative for back pain, joint pain and myalgias.  Skin:  Negative for rash.  Neurological:  Negative for dizziness, tingling, focal weakness, seizures, weakness and headaches.  Endo/Heme/Allergies:  Does not bruise/bleed easily.   Psychiatric/Behavioral:  Negative for depression and suicidal ideas. The patient does not have insomnia.       Allergies  Allergen Reactions   Ace Inhibitors Cough     Past Medical History:  Diagnosis Date   Anxiety    Asthma    Cancer (HCC)    Cough    Dyspnea    with exertion   GERD (gastroesophageal reflux disease)    Hypertension    Lung cancer [facility]      Past Surgical History:  Procedure Laterality Date   BUNIONECTOMY Bilateral    COLONOSCOPY     COLONOSCOPY WITH PROPOFOL  N/A 07/30/2021   Procedure: COLONOSCOPY WITH PROPOFOL ;  Surgeon: Onita Elspeth Sharper, DO;  Location[facility] ENDOSCOPY;  Service: Gastroenterology;  Laterality: N/A;   ESOPHAGOGASTRODUODENOSCOPY  04/01/2006   FLEXIBLE BRONCHOSCOPY Right 10/13/2022   Procedure: FLEXIBLE BRONCHOSCOPY;  Surgeon: Isadora Hose, MD;  Location: [facility] ORS;  Service: Pulmonary;  Laterality: Right;   PORTA CATH INSERTION N/A 12/22/2020   Procedure: PORTA CATH INSERTION;  Surgeon: Marea Selinda RAMAN, MD;  Location: [facility] INVASIVE CV LAB;  Service: Cardiovascular;  Laterality: N/A;   PORTA CATH REMOVAL N/A 05/31/2022   Procedure: PORTA CATH REMOVAL;  Surgeon: Marea Selinda RAMAN, MD;  Location: [facility] INVASIVE CV LAB;  Service: Cardiovascular;  Laterality: N/A;   THORACENTESIS     VIDEO BRONCHOSCOPY WITH ENDOBRONCHIAL NAVIGATION N/A 11/07/2020   Procedure: VIDEO BRONCHOSCOPY WITH ENDOBRONCHIAL NAVIGATION;  Surgeon: Parris Manna, MD;  Location: [facility] ORS;  Service: Thoracic;  Laterality: N/A;   VIDEO BRONCHOSCOPY WITH ENDOBRONCHIAL ULTRASOUND N/A 11/07/2020   Procedure: VIDEO BRONCHOSCOPY WITH ENDOBRONCHIAL ULTRASOUND;  Surgeon: Parris Manna, MD;  Location: [facility] ORS;  Service: Thoracic;  Laterality: N/A;   VIDEO BRONCHOSCOPY WITH ENDOBRONCHIAL ULTRASOUND Right 10/13/2022   Procedure: VIDEO BRONCHOSCOPY WITH ENDOBRONCHIAL ULTRASOUND;  Surgeon: Isadora Hose, MD;  Location: [facility] ORS;  Service: Pulmonary;  Laterality: Right;    Social  History   Socioeconomic History   Marital status: Married    Spouse name: Not on file   Number of children: Not on file   Years of education: Not on file   Highest education level: Not on file  Occupational History   Not on file  Tobacco Use   Smoking status: Former    Current packs/day: 0.00    Average packs/day: 1.5 packs/day for 26.8 years (40.2 ttl pk-yrs)    Types: Cigarettes    Start date: 01/19/1968    Quit date: 11/07/1994    Years since quitting: 29.2   Smokeless tobacco: Never  Vaping Use   Vaping status: Never Used  Substance and Sexual Activity   Alcohol use: Yes    Comment:  wine daily   Drug use: Never   Sexual activity: Yes  Other Topics Concern   Not on file  Social History Narrative   Lives with husband, Ryan, no pets.   Social Drivers of Corporate investment banker Strain: Low Risk  (10/06/2023)   Received [facility]   Overall Financial Resource Strain (CARDIA)    Difficulty of Paying Living Expenses: Not hard at all  Food Insecurity: No Food Insecurity (10/06/2023)   Received from [facility]   Hunger Vital Sign    Within the past 12 months, you worried that your food would run out before you got the money to buy more.:  Never true    Within the past 12 months, the food you bought just didn't last and you didn't have money to get more.: Never true  Transportation Needs: No Transportation Needs (10/06/2023)   Received from [facility] - Transportation    In the past 12 months, has lack of transportation kept you from medical appointments or from getting medications?: No    Lack of Transportation (Non-Medical): No  Physical Activity: Not on file  Stress: Not on file  Social Connections: Not on file  Intimate Partner Violence: Not At Risk (11/05/2022)   Humiliation, Afraid, Rape, and Kick questionnaire    Fear of Current or Ex-Partner: No    Emotionally Abused: No    Physically Abused: No     Sexually Abused: No    Family History  Problem Relation Age of Onset   Heart attack Father    Heart disease Father    Heart disease Sister    Breast cancer Neg Hx      Current Outpatient Medications:    acetaminophen  (TYLENOL ) 500 MG tablet, Take 500 mg by mouth every 6 (six) hours as needed., Disp: , Rfl:    ALPRAZolam  (XANAX ) 0.5 MG tablet, Take 0.5 mg by mouth at bedtime as needed for sleep., Disp: , Rfl:    aspirin  EC 81 MG tablet, Take 1 tablet (81 mg total) by mouth daily. Swallow whole., Disp: , Rfl:    azelastine  (ASTELIN ) 0.1 % nasal spray, Place 2 sprays into both nostrils 2 (two) times daily. Use in each nostril as directed, Disp: 30 mL, Rfl: 2   zolpidem  (AMBIEN ) 5 MG tablet, Take 0.5 tablets by mouth at bedtime as needed., Disp: , Rfl:    Cholecalciferol  50 MCG (2000 UT) CAPS, Take 2,000 Units by mouth daily. (Patient not taking: Reported on 01/27/2024), Disp: , Rfl:    Cyanocobalamin (B-12 COMPLIANCE INJECTION IJ), Inject 1,000 mcg as directed. Once a month, Disp: , Rfl:    Cyanocobalamin (B-12 PO), Take by mouth daily at 6 (six) AM., Disp: , Rfl:    esomeprazole (NEXIUM) 20 MG capsule, Take 20 mg by mouth as needed., Disp: , Rfl:    fluticasone -salmeterol (WIXELA INHUB) 100-50 MCG/ACT AEPB, Inhale 1 puff into the lungs as needed., Disp: , Rfl:    folic acid  (FOLVITE ) 1 MG tablet, TAKE 1 TABLET BY MOUTH EVERY DAY, Disp: 90 tablet, Rfl: 3   HYDROcodone  bit-homatropine (HYCODAN) 5-1.5 MG/5ML syrup, Take 5 mLs by mouth every 6 (six) hours as needed for cough. (Patient not taking: Reported on 11/04/2023), Disp: 120 mL, Rfl: 0   metoprolol  succinate (TOPROL -XL) 50 MG 24 hr tablet, TAKE 1 TABLET BY MOUTH EVERY DAY, Disp: 90 tablet, Rfl: 1   montelukast  (SINGULAIR ) 10 MG tablet, Take 1 tablet by mouth at bedtime., Disp: , Rfl:    spironolactone  (ALDACTONE ) 25 MG tablet, Take 25 mg by mouth daily., Disp: , Rfl:  No current facility-administered medications for this  visit.  Facility-Administered Medications Ordered in Other Visits:    heparin  lock flush 100 UNIT/ML injection, , , ,    heparin  lock flush 100 UNIT/ML injection, , , ,    heparin  lock flush 100 UNIT/ML injection, , , ,    heparin  lock flush 100 UNIT/ML injection, , , ,    heparin  lock flush 100 unit/mL, 500 Units, Intravenous, Once, Melanee Annah BROCKS, MD   sodium chloride  flush (NS) 0.9 % injection 10 mL, 10 mL, Intravenous, Once, Melanee Annah  C, MD   sodium chloride  flush (NS) 0.9 % injection 10 mL, 10 mL, Intravenous, PRN, Melanee Annah BROCKS, MD, 10 mL at 01/26/21 0807   sodium chloride  flush (NS) 0.9 % injection 10 mL, 10 mL, Intravenous, Once, Melanee Annah BROCKS, MD  Physical exam:  Vitals:   01/27/24 1139  BP: (!) 143/80  Pulse: (!) 108  Resp: 18  Temp: 98.6 F (37 C)  TempSrc: Tympanic  SpO2: 100%  Weight: 144 lb (65.3 kg)  Height: 5' 5 (1.651 m)   Physical Exam  Cardiovascular:     Rate and Rhythm: Normal rate and regular rhythm.     Heart sounds: Normal heart sounds.  Pulmonary:     Effort: Pulmonary effort is normal.     Breath sounds: Normal breath sounds.  Abdominal:     General: Bowel sounds are normal.   Skin:    General: Skin is warm and dry.   Neurological:     Mental Status: She is alert and oriented to person, place, and time.      I have personally reviewed labs listed below:    Latest Ref Rng & Units 11/22/2022   10:19 AM  CMP  Glucose 70 - 99 mg/dL 91   BUN 8 - 23 mg/dL 10   Creatinine 9.55 - 1.00 mg/dL 9.14   Sodium 864 - 854 mmol/L 129   Potassium 3.5 - 5.1 mmol/L 3.5   Chloride 98 - 111 mmol/L 89   CO2 22 - 32 mmol/L 24   Calcium  8.9 - 10.3 mg/dL 8.8   Total Protein 6.5 - 8.1 g/dL 8.2   Total Bilirubin 0.3 - 1.2 mg/dL 0.7   Alkaline Phos 38 - 126 U/L 128   AST 15 - 41 U/L 19   ALT 0 - 44 U/L 13       Latest Ref Rng & Units 11/22/2022   10:19 AM  CBC  WBC 4.0 - 10.5 K/uL 5.6   Hemoglobin 12.0 - 15.0 g/dL 88.3   Hematocrit 63.9 - 46.0 %  35.1   Platelets 150 - 400 K/uL 527    I have personally reviewed Radiology images listed below: No images are attached to the encounter.  MR Thoracic Spine W Wo Contrast Result Date: 01/27/2024 EXAM: MRI THORACIC SPINE WITH AND WITHOUT INTRAVENOUS CONTRAST 01/18/2024 09:34:17 AM TECHNIQUE: Multiplanar multisequence MRI of the thoracic spine was performed with and without the administration of intravenous contrast. COMPARISON: None available. CLINICAL HISTORY: Personal history of lung cancer. Compression fracture. FINDINGS: BONES AND ALIGNMENT: A superior endplate compression fracture at T11 demonstrates 25% loss of height relative to the adjacent level. Endplate fracture projects posteriorly 3 mm into the spinal canal. Marrow signal change and enhancement is consistent with an osteoporotic fracture. Edema and enhancement in the posterior elements of T10 and T11. Diffuse fatty infiltration of the vertebral bodies T7  T10 is consistent with prior radiation. No other pathologic enhancement or marrow signal changes present. Slight degenerative anterolisthesis is present at T1-2 and T2-3. Levoconvex curvature in the thoracic spine is centered at T10-11. Normal thoracic kyphosis is present. SPINAL CORD: Retropulsed bone distorts the ventral surface of the cord at T11 . Mild edema is present in the spinal cord at T10 and T11. SOFT TISSUES: A chronic right pleural effusion is present. The paraspinous soft tissues are otherwise unremarkable. DEGENERATIVE CHANGES: No significant disc herniation. No spinal canal stenosis or neural foraminal narrowing. IMPRESSION: 1. Superior endplate compression fracture at T11 with 25% loss of  height and 3 mm posterior retropulsion, consistent with osteoporotic fracture. Associated marrow signal change and enhancement. No enhancing metastatic lesions. 2. Edema and enhancement in the posterior elements of T10 and T11 likely represents posttraumatic change. 3. Mild spinal cord edema at  T10 and T11 secondary to ventral compression by retropulsed bone fragments at T11 . 4. Stable chronic post-treatment changes of the right hemithorax. Electronically signed by: Lonni Necessary MD 01/27/2024 06:16 AM EDT RP Workstation: [id number]   CT CHEST ABDOMEN PELVIS W CONTRAST Result Date: 01/15/2024 CLINICAL DATA:  Lung cancer surveillance * Tracking Code: BO * EXAM: CT CHEST, ABDOMEN, AND PELVIS WITH CONTRAST TECHNIQUE: Multidetector CT imaging of the chest, abdomen and pelvis was performed following the standard protocol during bolus administration of intravenous contrast. RADIATION DOSE REDUCTION: This exam was performed according to the departmental dose-optimization program which includes automated exposure control, adjustment of the mA and/or kV according to patient size and/or use of iterative reconstruction technique. CONTRAST:  85mL OMNIPAQUE  IOHEXOL  300 MG/ML  SOLN COMPARISON:  CT chest, 08/09/2023 CT chest abdomen pelvis, 08/20/2022 chest radiographs, 12/07/2023 FINDINGS: CT CHEST FINDINGS Cardiovascular: Aortic atherosclerosis. Normal heart size. Left and right coronary artery calcifications. No pericardial effusion. Mediastinum/Nodes: No enlarged mediastinal, hilar, or axillary lymph nodes. Circumferential wall thickening of the mid esophagus, likely related to local radiation therapy (series 2, image 29). Thyroid  and trachea unremarkable. Lungs/Pleura: Unchanged post treatment appearance of the right chest, with dense, fibrotic consolidation of the perihilar and infrahilar right lung as well as a moderate, loculated right pleural effusion with extensive associated pleural thickening. Mild paraseptal emphysema, diffuse bilateral bronchial wall thickening, and a background of fine centrilobular nodularity most concentrated in the lung apices. Musculoskeletal: No chest wall abnormality. Sclerotic superior endplate wedge deformity of T11 (series 6, image 88). CT ABDOMEN PELVIS FINDINGS  Hepatobiliary: No solid liver abnormality is seen. No gallstones, gallbladder wall thickening, or biliary dilatation. Pancreas: Unremarkable. No pancreatic ductal dilatation or surrounding inflammatory changes. Spleen: Normal in size without significant abnormality. Adrenals/Urinary Tract: Adrenal glands are unremarkable. Kidneys are normal, without renal calculi, solid lesion, or hydronephrosis. Bladder is unremarkable. Stomach/Bowel: Stomach is within normal limits. Appendix appears normal. No evidence of bowel wall thickening, distention, or inflammatory changes. Vascular/Lymphatic: Aortic atherosclerosis. No enlarged abdominal or pelvic lymph nodes. Reproductive: No mass or other abnormality. Other: No abdominal wall hernia or abnormality. No ascites. Musculoskeletal: No acute osseous findings. IMPRESSION: 1. Unchanged post treatment appearance of the right chest, with dense, fibrotic consolidation of the perihilar and infrahilar right lung as well as a moderate, loculated right pleural effusion with extensive associated pleural thickening. 2. No evidence of lymphadenopathy or metastatic disease in the chest, abdomen, or pelvis. 3. Sclerotic superior endplate wedge deformity of T11 seen on chest radiographs dated 12/07/2023 although new compared to CT dated 08/09/2023. Consider contrast enhanced MRI to assess for underlying metastasis and pathologic fracture. 4. Emphysema and smoking-related respiratory bronchiolitis. 5. Coronary artery disease. Aortic Atherosclerosis (ICD10-I70.0) and Emphysema (ICD10-J43.9). Electronically Signed   By: Marolyn JONETTA Jaksch M.D.   On: 01/15/2024 17:51   US  CHEST (PLEURAL EFFUSION) Result Date: 12/29/2023 CLINICAL DATA:  Patient is a 68 y/o female with history of right lower lobe lung cancer. Patient presents for a therapeutic thoracentesis due to a recurrent pleural effusion. EXAM: CHEST ULTRASOUND COMPARISON:  None Available. FINDINGS: US  Chest reveals a loculated pleural  effusion. IMPRESSION: US  chest reveals a loculated right pleural effusion. After discussion about risks vs benefits, including bleeding, damage to adjacent structures, pneumothorax  and insufficient therapeutic relief, patient declined procedure today. Interpreted by Daved Hipp PA-C Electronically Signed   By: Ami Bellman D.O.   On: 12/29/2023 15:12     Assessment and plan- Patient is a 68 y.o. 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  She is s/p concurrent CarboTaxol chemotherapy followed by 1 year of maintenance durvalumab  ending in July 2023.  She is here to discuss CT scan results and further management  I have reviewed CT chest abdomen and pelvis images independently and discussed findings with the patient which overall shows stable posttreatment changes in the right breast with chronic consolidation in the perihilar and infrahilar right lung with loculated pleural effusions and pleural thickening.  No other evidence of progressive disease.  She was noted to have a wedge deformity of T11Vertebral body and therefore underwent MRI thoracic spine with and without contrast.  This showed changes of compression fracture in the setting of osteoporosis with 25% loss of height but no evidence of pathological fracture or enhancing metastatic lesions.  From a lung cancer standpoint she remains stable and I will see her back in 6 months with repeat scans.  With regards to compression fracture from osteoporosis: I will plan on getting bone density scan at this time.  I have asked her to stay on at least a preventative dose of calcium  1200 mg and vitamin D  800 international units.  She will discuss further management of osteoporosis with Dr. Cleotilde.Use of kyphoplasty and osteoporotic compression fractures is overall controversial and I am not referring her to IR at this time.   Visit Diagnosis 1. Abnormal MRI, spine   2. Encounter for follow-up surveillance of lung  cancer   3. Malignant neoplasm of lower lobe of right lung (HCC)      Dr. Annah Skene, MD, MPH [facility] [phone] 01/27/2024 4:20 PM

## 2024-01-27 NOTE — Progress Notes (Signed)
 MRI 01/18/2024. Patient and her spouse are concerned due to her having the cough still, that has got worse, even though they told her that there wasn't enough fluid in her lungs to pull anything out. She was recently diagnosed with having shingles on her upper middle back. It is very painful, which she says that it really only hurts her bad when she is up trying to clean the house.

## 2024-02-08 ENCOUNTER — Other Ambulatory Visit: Payer: Self-pay | Admitting: Cardiovascular Disease

## 2024-02-09 ENCOUNTER — Ambulatory Visit: Admitting: Student in an Organized Health Care Education/Training Program

## 2024-02-09 NOTE — Telephone Encounter (Signed)
Please contact pt for future appointment. Pt overdue for follow up pt needing refills.

## 2024-02-09 NOTE — Telephone Encounter (Signed)
Pt scheduled on 7/15

## 2024-02-13 ENCOUNTER — Encounter: Payer: Self-pay | Admitting: Student in an Organized Health Care Education/Training Program

## 2024-02-13 ENCOUNTER — Ambulatory Visit: Admitting: Student in an Organized Health Care Education/Training Program

## 2024-02-13 VITALS — BP 140/100 | HR 97 | Temp 98.0°F | Ht 65.0 in | Wt 140.0 lb

## 2024-02-13 DIAGNOSIS — J439 Emphysema, unspecified: Secondary | ICD-10-CM | POA: Diagnosis not present

## 2024-02-13 DIAGNOSIS — C3431 Malignant neoplasm of lower lobe, right bronchus or lung: Secondary | ICD-10-CM

## 2024-02-13 DIAGNOSIS — Z87891 Personal history of nicotine dependence: Secondary | ICD-10-CM

## 2024-02-13 DIAGNOSIS — J4489 Other specified chronic obstructive pulmonary disease: Secondary | ICD-10-CM | POA: Diagnosis not present

## 2024-02-13 DIAGNOSIS — J90 Pleural effusion, not elsewhere classified: Secondary | ICD-10-CM | POA: Diagnosis not present

## 2024-02-13 MED ORDER — TRELEGY ELLIPTA 200-62.5-25 MCG/ACT IN AEPB: 1.0000 | INHALATION_SPRAY | Freq: Every day | RESPIRATORY_TRACT | 11 refills | Status: DC

## 2024-02-13 NOTE — Progress Notes (Signed)
 Assessment & Plan:   #Recurrent pleural effusion on right #Stage III NSCLCa #Pneumothorax Ex-Vacuo - resolved   Katherine Perry has a history of recurrent right sided pleural effusion in the setting of NSCLCa (s/p chemoradiation). Effusion has been stable and has unchanged, suggesting loculation. The RLL is trapped and scarred secondary to chronic effusion and previous radiation therapy. The residual effusion is stable and showed loculations and septations on CT. Last thoracentesis is 6 months ago and only 300 mL were drained. Would recommend no further thoracentesis at this point unless this grows in size. Repeat chest CT was reviewed by me personally and is re-assuring with stable effusion and no sign of recurrent malignancy.  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 Katherine Perry's had spirometry in 2022 showing FEV1/FVC of 0.67, with FEV1 of 1.61, consistent with COPD. Katherine Perry is currently on Wixela, to which I will add LAMA therapy. Will switch her to Trelegy and assess symptomatic response.  - Fluticasone -Umeclidin-Vilant (TRELEGY ELLIPTA ) 200-62.5-25 MCG/ACT AEPB; Inhale 1 puff into the lungs daily.  Dispense: 30 each; Refill: 11   Return in about 4 months (around 06/15/2024).  I spent 30 minutes caring for this patient today, including preparing to see the patient, obtaining a medical history , reviewing a separately obtained history, performing a medically appropriate examination and/or evaluation, counseling and educating the patient/family/caregiver, ordering medications, tests, or procedures, documenting clinical information in the electronic health record, and independently interpreting results (not separately reported/billed) and communicating results to the patient/family/caregiver  Belva November, MD [facility]   End of visit medications:  Meds ordered this encounter  Medications   Fluticasone -Umeclidin-Vilant  (TRELEGY ELLIPTA ) 200-62.5-25 MCG/ACT AEPB    Sig: Inhale 1 puff into the lungs daily.    Dispense:  30 each    Refill:  11     Current Outpatient Medications:    acetaminophen  (TYLENOL ) 500 MG tablet, Take 500 mg by mouth every 6 (six) hours as needed., Disp: , Rfl:    ALPRAZolam  (XANAX ) 0.5 MG tablet, Take 0.5 mg by mouth at bedtime as needed for sleep., Disp: , Rfl:    aspirin  EC 81 MG tablet, Take 1 tablet (81 mg total) by mouth daily. Swallow whole., Disp: , Rfl:    azelastine  (ASTELIN ) 0.1 % nasal spray, Place 2 sprays into both nostrils 2 (two) times daily. Use in each nostril as directed, Disp: 30 mL, Rfl: 2   Cholecalciferol  50 MCG (2000 UT) CAPS, Take 2,000 Units by mouth daily., Disp: , Rfl:    Cyanocobalamin (B-12 PO), Take by mouth daily at 6 (six) AM., Disp: , Rfl:    esomeprazole (NEXIUM) 20 MG capsule, Take 20 mg by mouth as needed., Disp: , Rfl:    fluticasone -salmeterol (WIXELA INHUB) 100-50 MCG/ACT AEPB, Inhale 1 puff into the lungs as needed., Disp: , Rfl:    Fluticasone -Umeclidin-Vilant (TRELEGY ELLIPTA ) 200-62.5-25 MCG/ACT AEPB, Inhale 1 puff into the lungs daily., Disp: 30 each, Rfl: 11   folic acid  (FOLVITE ) 1 MG tablet, TAKE 1 TABLET BY MOUTH EVERY DAY, Disp: 90 tablet, Rfl: 3   metoprolol  succinate (TOPROL -XL) 50 MG 24 hr tablet, TAKE 1 TABLET BY MOUTH EVERY DAY, Disp: 90 tablet, Rfl: 0   montelukast  (SINGULAIR ) 10 MG tablet, Take 1 tablet by mouth at bedtime., Disp: , Rfl:    spironolactone  (ALDACTONE ) 25 MG tablet, Take 25 mg by mouth daily., Disp: , Rfl:    zolpidem  (AMBIEN ) 5 MG tablet,  Take 0.5 tablets by mouth at bedtime as needed., Disp: , Rfl:  No current facility-administered medications for this visit.  Facility-Administered Medications Ordered in Other Visits:    heparin  lock flush 100 UNIT/ML injection, , , ,    heparin  lock flush 100 UNIT/ML injection, , , ,    heparin  lock flush 100 UNIT/ML injection, , , ,    heparin  lock flush 100 UNIT/ML  injection, , , ,    heparin  lock flush 100 unit/mL, 500 Units, Intravenous, Once, Melanee Annah BROCKS, MD   sodium chloride  flush (NS) 0.9 % injection 10 mL, 10 mL, Intravenous, Once, Melanee Annah BROCKS, MD   sodium chloride  flush (NS) 0.9 % injection 10 mL, 10 mL, Intravenous, PRN, Melanee Annah BROCKS, MD, 10 mL at 01/26/21 9192   sodium chloride  flush (NS) 0.9 % injection 10 mL, 10 mL, Intravenous, Once, Melanee Annah BROCKS, MD   Subjective:   PATIENT ID: Katherine Perry GENDER: female DOB: 1956/03/21, MRN: [id number]  Chief Complaint  Patient presents with   Follow-up    Cough with yellow phlegm. Occasional wheezing.     HPI  Katherine Perry is a pleasant 68 year old female with history of non-small cell lung cancer s/p chemo-radiation with a recurrent pleural effusion who is presenting to clinic for follow up.  Patient was initially seen in clinic for the evaluation of a recurrent right sided pleural effusion requiring multiple thoracentesis. Katherine Perry's previously had pneumothorax ex-vacuo due to trapped lung physiology. Fluid was exudative but cytology was negative. Katherine Perry Does carry a diagnosis of lung cancer from 10/2020 (biopsy at that time of the right lower lobe showed non-small cell lung cancer consistent with adenocarcinoma) and received chemoradiation in coordination with oncology and radiation oncology. Katherine Perry underwent thoracentesis numerous times (see previous notes). Katherine Perry also underwent EBUS in March of 2024 where the RLL consolidation and station 11R superior were biopsied without any finding of malignancy.   Katherine Perry was found to have a post procedural pneumothorax following December 2023's thoracentesis for which Katherine Perry required a visit to the ED and a quick admission for observation. Katherine Perry was admitted in April to the hospital for Afib with RVR, and was found to have a small to moderate pericardial effusion. Katherine Perry was seen by cardiology, continued on Eliquis , and repeat echo was ordered for follow up of the pericardial  effusion which showed it to be resolved. Katherine Perry has since stopped taking the Eliquis .  Return Visit 12/07/2023:  Katherine Perry has felt well overall but has had some recurrence of her cough. Katherine Perry did present to our office for an acute visit in April of 2025 with increased shortness of breath and cough with streaky hemoptysis, treated for bronchitis with a course of antibiotics and inhaler, but without steroids. Katherine Perry continues to be short of breath with significant exertion (especially going up an inclined path). Katherine Perry was recently in [place]  with friends and felt ok. Her last thoracentesis was in January where 300 mL were drained with minimal improvement in symptoms. Katherine Perry is no longer taking Eliquis  for her Afib.  Return Visit 02/13/2024:  Katherine Perry is returning with main complaint of cough. The cough is worse at night, but does occur during the day. It is at times productive of sputum. Katherine Perry's not had any associated fevers or chills, and no chest pain. Katherine Perry is using her Wixela without much improvement. Katherine Perry did call our office in May with symptoms concerning for reaccumulating effusion, but on US  with IR this was small and loculated, and  similar to prior. Her last thoracentesis was in January of 2025 (300 mL drained). Katherine Perry's also had a repeat CT scan of the chest recently, ordered by her oncologist Dr. Melanee.  Patient has a history of smoking, with around 40 pack years of smoking history. Katherine Perry quit in 1996.  Ancillary information including prior medications, full medical/surgical/family/social histories, and PFTs (when available) are listed below and have been reviewed.   Review of Systems  Constitutional:  Negative for chills and fever.  Respiratory:  Positive for cough and shortness of breath (with exertion). Negative for sputum production and wheezing.   Cardiovascular:  Negative for chest pain.  Skin:  Negative for rash.     Objective:   Vitals:   02/13/24 1100  BP: (!) 140/100  Pulse: 97  Temp: 98 F (36.7 C)   TempSrc: Oral  SpO2: 99%  Weight: 140 lb (63.5 kg)  Height: 5' 5 (1.651 m)   99% on RA BMI Readings from Last 3 Encounters:  02/13/24 23.30 kg/m  01/27/24 23.96 kg/m  01/18/24 23.30 kg/m   Wt Readings from Last 3 Encounters:  02/13/24 140 lb (63.5 kg)  01/27/24 144 lb (65.3 kg)  01/18/24 140 lb (63.5 kg)    Physical Exam Constitutional:      Appearance: Normal appearance. Katherine Perry is not ill-appearing.  HENT:     Head: Normocephalic.     Mouth/Throat:     Mouth: Mucous membranes are moist.  Cardiovascular:     Rate and Rhythm: Normal rate and regular rhythm.     Heart sounds: Normal heart sounds.  Pulmonary:     Breath sounds: No wheezing.     Comments: Decreased entry over the right, clear over the left Neurological:     General: No focal deficit present.     Mental Status: Katherine Perry is alert and oriented to person, place, and time. Mental status is at baseline.       Ancillary Information    Past Medical History:  Diagnosis Date   Anxiety    Asthma    Cancer (HCC)    Cough    Dyspnea    with exertion   GERD (gastroesophageal reflux disease)    Hypertension    Lung cancer (HCC)      Family History  Problem Relation Age of Onset   Heart attack Father    Heart disease Father    Heart disease Sister    Breast cancer Neg Hx      Past Surgical History:  Procedure Laterality Date   BUNIONECTOMY Bilateral    COLONOSCOPY     COLONOSCOPY WITH PROPOFOL  N/A 07/30/2021   Procedure: COLONOSCOPY WITH PROPOFOL ;  Surgeon: Onita Elspeth Sharper, DO;  Location: [facility];  Service: Gastroenterology;  Laterality: N/A;   ESOPHAGOGASTRODUODENOSCOPY  04/01/2006   FLEXIBLE BRONCHOSCOPY Right 10/13/2022   Procedure: FLEXIBLE BRONCHOSCOPY;  Surgeon: Isadora Hose, MD;  Location: [facility];  Service: Pulmonary;  Laterality: Right;   PORTA CATH INSERTION N/A 12/22/2020   Procedure: PORTA CATH INSERTION;  Surgeon: Marea Selinda RAMAN, MD;  Location: [facility];   Service: Cardiovascular;  Laterality: N/A;   PORTA CATH REMOVAL N/A 05/31/2022   Procedure: PORTA CATH REMOVAL;  Surgeon: Marea Selinda RAMAN, MD;  Location: [facility] CV [facility];  Service: Cardiovascular;  Laterality: N/A;   THORACENTESIS     VIDEO BRONCHOSCOPY WITH ENDOBRONCHIAL NAVIGATION N/A 11/07/2020   Procedure: VIDEO BRONCHOSCOPY WITH ENDOBRONCHIAL NAVIGATION;  Surgeon: Parris Manna, MD;  Location: [facility];  Service: Thoracic;  Laterality: N/A;   VIDEO BRONCHOSCOPY WITH ENDOBRONCHIAL ULTRASOUND N/A 11/07/2020   Procedure: VIDEO BRONCHOSCOPY WITH ENDOBRONCHIAL ULTRASOUND;  Surgeon: Parris Manna, MD;  Location: [facility];  Service: Thoracic;  Laterality: N/A;   VIDEO BRONCHOSCOPY WITH ENDOBRONCHIAL ULTRASOUND Right 10/13/2022   Procedure: VIDEO BRONCHOSCOPY WITH ENDOBRONCHIAL ULTRASOUND;  Surgeon: Isadora Hose, MD;  Location: [facility];  Service: Pulmonary;  Laterality: Right;    Social History   Socioeconomic History   Marital status: Married    Spouse name: Not on file   Number of children: Not on file   Years of education: Not on file   Highest education level: Not on file  Occupational History   Not on file  Tobacco Use   Smoking status: Former    Current packs/day: 0.00    Average packs/day: 1.5 packs/day for 26.8 years (40.2 ttl pk-yrs)    Types: Cigarettes    Start date: 01/19/1968    Quit date: 11/07/1994    Years since quitting: 29.2   Smokeless tobacco: Never  Vaping Use   Vaping status: Never Used  Substance and Sexual Activity   Alcohol use: Yes    Comment:  wine daily   Drug use: Never   Sexual activity: Yes  Other Topics Concern   Not on file  Social History Narrative   Lives with husband, Ryan, no pets.   Social Drivers of Corporate investment banker Strain: Low Risk  (10/06/2023)   Received from [facility]   Overall Financial Resource Strain (CARDIA)    Difficulty of Paying Living Expenses: Not hard at all  Food Insecurity: No Food  Insecurity (10/06/2023)   Received from [facility]   Hunger Vital Sign    Within the past 12 months, you worried that your food would run out before you got the money to buy more.: Never true    Within the past 12 months, the food you bought just didn't last and you didn't have money to get more.: Never true  Transportation Needs: No Transportation Needs (10/06/2023)   Received from [facility] - Transportation    In the past 12 months, has lack of transportation kept you from medical appointments or from getting medications?: No    Lack of Transportation (Non-Medical): No  Physical Activity: Not on file  Stress: Not on file  Social Connections: Not on file  Intimate Partner Violence: Not At Risk (11/05/2022)   Humiliation, Afraid, Rape, and Kick questionnaire    Fear of Current or Ex-Partner: No    Emotionally Abused: No    Physically Abused: No    Sexually Abused: No     Allergies  Allergen Reactions   Ace Inhibitors Cough     CBC    Component Value Date/Time   WBC 5.6 11/22/2022 1019   RBC 3.64 (L) 11/22/2022 1019   HGB 11.6 (L) 11/22/2022 1019   HCT 35.1 (L) 11/22/2022 1019   PLT 527 (H) 11/22/2022 1019   MCV 96.4 11/22/2022 1019   MCH 31.9 11/22/2022 1019   MCHC 33.0 11/22/2022 1019   RDW 12.2 11/22/2022 1019   LYMPHSABS 0.9 11/22/2022 1019   MONOABS 0.6 11/22/2022 1019   EOSABS 0.0 11/22/2022 1019   BASOSABS 0.0 11/22/2022 1019    Pulmonary Functions Testing Results:     No data to display          Outpatient Medications Prior to Visit  Medication Sig Dispense Refill   acetaminophen  (  TYLENOL ) 500 MG tablet Take 500 mg by mouth every 6 (six) hours as needed.     ALPRAZolam  (XANAX ) 0.5 MG tablet Take 0.5 mg by mouth at bedtime as needed for sleep.     aspirin  EC 81 MG tablet Take 1 tablet (81 mg total) by mouth daily. Swallow whole.     azelastine  (ASTELIN ) 0.1 % nasal spray Place 2 sprays into both nostrils 2 (two)  times daily. Use in each nostril as directed 30 mL 2   Cholecalciferol  50 MCG (2000 UT) CAPS Take 2,000 Units by mouth daily.     Cyanocobalamin (B-12 PO) Take by mouth daily at 6 (six) AM.     esomeprazole (NEXIUM) 20 MG capsule Take 20 mg by mouth as needed.     fluticasone -salmeterol (WIXELA INHUB) 100-50 MCG/ACT AEPB Inhale 1 puff into the lungs as needed.     folic acid  (FOLVITE ) 1 MG tablet TAKE 1 TABLET BY MOUTH EVERY DAY 90 tablet 3   metoprolol  succinate (TOPROL -XL) 50 MG 24 hr tablet TAKE 1 TABLET BY MOUTH EVERY DAY 90 tablet 0   montelukast  (SINGULAIR ) 10 MG tablet Take 1 tablet by mouth at bedtime.     spironolactone  (ALDACTONE ) 25 MG tablet Take 25 mg by mouth daily.     zolpidem  (AMBIEN ) 5 MG tablet Take 0.5 tablets by mouth at bedtime as needed.     Cyanocobalamin (B-12 COMPLIANCE INJECTION IJ) Inject 1,000 mcg as directed. Once a month     HYDROcodone  bit-homatropine (HYCODAN) 5-1.5 MG/5ML syrup Take 5 mLs by mouth every 6 (six) hours as needed for cough. (Patient not taking: Reported on 11/04/2023) 120 mL 0   Facility-Administered Medications Prior to Visit  Medication Dose Route Frequency Provider Last Rate Last Admin   heparin  lock flush 100 UNIT/ML injection            heparin  lock flush 100 UNIT/ML injection            heparin  lock flush 100 UNIT/ML injection            heparin  lock flush 100 UNIT/ML injection            heparin  lock flush 100 unit/mL  500 Units Intravenous Once Rao, Archana C, MD       sodium chloride  flush (NS) 0.9 % injection 10 mL  10 mL Intravenous Once Rao, Archana C, MD       sodium chloride  flush (NS) 0.9 % injection 10 mL  10 mL Intravenous PRN Melanee Annah BROCKS, MD   10 mL at 01/26/21 0807   sodium chloride  flush (NS) 0.9 % injection 10 mL  10 mL Intravenous Once Rao, Archana C, MD

## 2024-02-14 ENCOUNTER — Ambulatory Visit: Attending: Medical | Admitting: Medical

## 2024-02-14 ENCOUNTER — Encounter: Payer: Self-pay | Admitting: Medical

## 2024-02-14 VITALS — BP 150/80 | HR 92 | Ht 65.0 in | Wt 139.8 lb

## 2024-02-14 DIAGNOSIS — I10 Essential (primary) hypertension: Secondary | ICD-10-CM

## 2024-02-14 DIAGNOSIS — C3490 Malignant neoplasm of unspecified part of unspecified bronchus or lung: Secondary | ICD-10-CM

## 2024-02-14 DIAGNOSIS — I4892 Unspecified atrial flutter: Secondary | ICD-10-CM | POA: Diagnosis not present

## 2024-02-14 DIAGNOSIS — I3139 Other pericardial effusion (noninflammatory): Secondary | ICD-10-CM

## 2024-02-14 MED ORDER — METOPROLOL SUCCINATE ER 50 MG PO TB24: 75.0000 mg | ORAL_TABLET | Freq: Every day | ORAL | 3 refills | Status: AC

## 2024-02-14 NOTE — Progress Notes (Signed)
 Cardiology Office Note   Date:  02/14/2024  ID:  Katherine Perry, DOB [birth year], MRN [id number] PCP: Cleotilde Oneil FALCON, MD  [facility] Providers Cardiologist:  Deatrice Cage, MD   History of Present Illness Katherine Perry is a 68 y.o. 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  In April 2024 she was found to be in a flutter RVR in the office.  CT scan of the chest showed large right pleural effusion with no PE.  There was moderate size pericardial effusion.  The patient was noted to go in and out of sinus rhythm with significant tachycardia.  She was started on amiodarone  drip.  Echo showed normal LVSF with moderate size pericardial effusion.  Amiodarone  was subsequently discontinued, but she was continued on Toprol  and Eliquis .  Repeat limited echocardiogram in July 2024 showed resolution of pericardial effusion with normal EF.  Patient was last seen August 2024 and was doing well from a cardiac perspective.  Today, thea patient is overall doing well. She is in NSR. She is on low dose ASA and she denies bleeding issues. Lung issues are stable. She denies chest pain, SOB, lower lg edema, dizziness, lightheadedness, orthopnea or pnd. BP today is a little high. Diet is healthy. Patient stays fairly active. Na has been low.   Studies Reviewed EKG Interpretation Date/Time:  Tuesday February 14 2024 11:13:06 EDT Ventricular Rate:  92 PR Interval:  116 QRS Duration:  74 QT Interval:  352 QTC Calculation: 435 R Axis:   44  Text Interpretation: Normal sinus rhythm Normal ECG When compared with ECG of 17-Mar-2023 10:06, No significant change was found Confirmed by Franchester, Aamya Orellana ([id number]) on 02/14/2024 11:16:32 AM    Heart monitor 04/2023 Patch Wear Time:  7 days and 18 hours (2024-08-19T15:06:43-0400 to 2024-08-27T09:33:25-0400)   Patient had a min  HR of 83 bpm, max HR of 157 bpm, and avg HR of 107 bpm. Predominant underlying rhythm was Sinus Rhythm.  1 run of Supraventricular Tachycardia occurred lasting 5 beats with a max rate of 143 bpm (avg 124 bpm). Rare PACs and rare PVCs.  Echo limited 01/2023 1. Left ventricular ejection fraction, by estimation, is 60 to 65%. Left  ventricular ejection fraction by 2D MOD biplane is 60.2 %. The left  ventricle has normal function. The left ventricle has no regional wall  motion abnormalities.   2. Right ventricular systolic function is normal. The right ventricular  size is normal.   3. The mitral valve is normal in structure. Trivial mitral valve  regurgitation.   4. The inferior vena cava is normal in size with greater than 50%  respiratory variability, suggesting right atrial pressure of 3 mmHg.   Echo 11/2022  1. Left ventricular ejection fraction, by estimation, is 55 to 60%. The  left ventricle has normal function. The left ventricle has no regional  wall motion abnormalities. Left ventricular diastolic parameters were  normal.   2. Right ventricular systolic function is normal. The right ventricular  size is normal.   3. Moderate pericardial effusion. The pericardial effusion is  circumferential. There is no evidence of cardiac tamponade.   4. The mitral valve is normal in structure. Mild mitral valve  regurgitation.   5. The aortic valve is tricuspid. Aortic valve regurgitation is not  visualized. Aortic valve sclerosis is present, with no evidence of aortic  valve stenosis.  6. The inferior vena cava is normal in size with <50% respiratory  variability, suggesting right atrial pressure of 8 mmHg.    HYPERTENSION CONTROL Vitals:   02/14/24 1059  BP: (!) 150/80    The patient's blood pressure is elevated above target today.  In order to address the patient's elevated BP: A current anti-hypertensive medication was adjusted today.          Physical Exam VS:  BP (!)  150/80 (BP Location: Left Arm, Patient Position: Sitting, Cuff Size: Normal)   Pulse 92   Ht 5' 5 (1.651 m)   Wt 139 lb 12.8 oz (63.4 kg)   SpO2 99%   BMI 23.26 kg/m        Wt Readings from Last 3 Encounters:  02/14/24 139 lb 12.8 oz (63.4 kg)  02/13/24 140 lb (63.5 kg)  01/27/24 144 lb (65.3 kg)    GEN: Well nourished, well developed in no acute distress NECK: No JVD; No carotid bruits CARDIAC: RRR, no murmurs, rubs, gallops RESPIRATORY:  Clear to auscultation without rales, wheezing or rhonchi  ABDOMEN: Soft, non-tender, non-distended EXTREMITIES:  No edema; No deformity   ASSESSMENT AND PLAN  Paroxysmal Afib/flutter EKG today shows NSR. CHADSVASC of 3. Heart monitor in 2024 showed no recurrent afib. In the setting of low CHADSVASC, no afib recurrence and needing frequent thoracentesis/lung procedures Eliquis  was changed to low dose ASA. Continue ASA 81mg  daily and toprol  for rate control.   HTN BP today is high, I will increase toprol  to 75mg  daily. Recommended daily monitoring at home.  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        Dispo: Follow-up in 6 months  Signed, Colie Fugitt VEAR Fishman, PA-C

## 2024-02-14 NOTE — Patient Instructions (Signed)
 Medication Instructions:  Your physician recommends the following medication changes.  INCREASE: Metoprolol  Succinate (Toprol ) to 75 mg by mouth daily    *If you need a refill on your cardiac medications before your next appointment, please call your pharmacy*  Lab Work: No labs ordered today    Testing/Procedures: No test ordered today   Follow-Up: At [facility], you and your health needs are our priority.  As part of our continuing mission to provide you with exceptional heart care, our providers are all part of one team.  This team includes your primary Cardiologist (physician) and Advanced Practice Providers or APPs (Physician Assistants and Nurse Practitioners) who all work together to provide you with the care you need, when you need it.  Your next appointment:   6 month(s)  Provider:   You may see Deatrice Cage, MD or one of the following Advanced Practice Providers on your designated Care Team:    Cadence Bison, PA-C

## 2024-02-28 ENCOUNTER — Other Ambulatory Visit: Payer: Self-pay | Admitting: Internal Medicine

## 2024-02-28 DIAGNOSIS — Z1231 Encounter for screening mammogram for malignant neoplasm of breast: Secondary | ICD-10-CM

## 2024-03-14 ENCOUNTER — Ambulatory Visit
Admission: RE | Admit: 2024-03-14 | Discharge: 2024-03-14 | Disposition: A | Discharge status: Home / Self Care | Source: Ambulatory Visit | Attending: Internal Medicine | Admitting: Internal Medicine

## 2024-03-14 DIAGNOSIS — Z1231 Encounter for screening mammogram for malignant neoplasm of breast: Secondary | ICD-10-CM | POA: Diagnosis present

## 2024-03-16 ENCOUNTER — Encounter: Payer: Self-pay | Admitting: Internal Medicine

## 2024-03-20 ENCOUNTER — Other Ambulatory Visit: Payer: Self-pay | Admitting: Internal Medicine

## 2024-03-20 DIAGNOSIS — R928 Other abnormal and inconclusive findings on diagnostic imaging of breast: Secondary | ICD-10-CM

## 2024-03-22 ENCOUNTER — Ambulatory Visit
Admission: RE | Admit: 2024-03-22 | Discharge: 2024-03-22 | Disposition: A | Discharge status: Home / Self Care | Source: Ambulatory Visit | Attending: Internal Medicine | Admitting: Internal Medicine

## 2024-03-22 ENCOUNTER — Inpatient Hospital Stay
Admission: RE | Admit: 2024-03-22 | Discharge: 2024-03-22 | Discharge status: Home / Self Care | Source: Ambulatory Visit | Attending: Internal Medicine | Admitting: Internal Medicine

## 2024-03-22 DIAGNOSIS — R928 Other abnormal and inconclusive findings on diagnostic imaging of breast: Secondary | ICD-10-CM

## 2024-03-27 ENCOUNTER — Other Ambulatory Visit

## 2024-06-25 ENCOUNTER — Ambulatory Visit: Admitting: Student in an Organized Health Care Education/Training Program

## 2024-06-25 ENCOUNTER — Encounter: Payer: Self-pay | Admitting: Student in an Organized Health Care Education/Training Program

## 2024-06-25 VITALS — BP 138/70 | HR 117 | Temp 97.9°F | Ht 65.0 in | Wt 147.2 lb

## 2024-06-25 DIAGNOSIS — J4489 Other specified chronic obstructive pulmonary disease: Secondary | ICD-10-CM | POA: Diagnosis not present

## 2024-06-25 DIAGNOSIS — J439 Emphysema, unspecified: Secondary | ICD-10-CM | POA: Diagnosis not present

## 2024-06-25 DIAGNOSIS — C3431 Malignant neoplasm of lower lobe, right bronchus or lung: Secondary | ICD-10-CM | POA: Diagnosis not present

## 2024-06-25 MED ORDER — TRELEGY ELLIPTA 200-62.5-25 MCG/ACT IN AEPB: 1.0000 | INHALATION_SPRAY | Freq: Every day | RESPIRATORY_TRACT | 11 refills | Status: AC

## 2024-06-25 NOTE — Progress Notes (Signed)
 Assessment & Plan:   #COPD with chronic bronchitis and emphysema  History of cough in the setting of smoking. She's had spirometry in 2022 showing FEV1/FVC of 0.67, with FEV1 of 1.61, consistent with COPD. She is currently on [place], after being on Trelegy. She felt better with the Trelegy. Will discontinue Wixela and re-order Trelegy for ICS/LABA/LAMA effect.  - Fluticasone -Umeclidin-Vilant (TRELEGY ELLIPTA ) 200-62.5-25 MCG/ACT AEPB; Inhale 1 puff into the lungs daily.  Dispense: 30 each; Refill: 11  #Malignant neoplasm of lower lobe of right lung #Recurrent pleural effusion on right #Stage III NSCLCa #Pneumothorax Ex-Vacuo - resolved   She has a history of recurrent right sided pleural effusion in the setting of NSCLCa (s/p chemoradiation). Effusion has been stable and has unchanged, suggesting loculation. The RLL is trapped and scarred secondary to chronic effusion and previous radiation therapy. The residual effusion is stable and showed loculations and septations on CT. Last thoracentesis was 10 months ago and only 300 mL were drained. Would recommend no further thoracentesis at this point unless this grows in size.e effusion and no sign of recurrent malignancy. She has an upcoming CT scan of the chest in December.   Return in about 6 months (around 12/23/2024).  Belva November, MD [facility]  I spent 30 minutes caring for this patient today, including preparing to see the patient, obtaining a medical history , reviewing a separately obtained history, performing a medically appropriate examination and/or evaluation, counseling and educating the patient/family/caregiver, ordering medications, tests, or procedures, documenting clinical information in the electronic health record, and independently interpreting results (not separately reported/billed) and communicating results to the patient/family/caregiver  End of visit medications:  Meds ordered this encounter   Medications   Fluticasone -Umeclidin-Vilant (TRELEGY ELLIPTA ) 200-62.5-25 MCG/ACT AEPB    Sig: Inhale 1 puff into the lungs daily.    Dispense:  30 each    Refill:  11     Current Outpatient Medications:    acetaminophen  (TYLENOL ) 500 MG tablet, Take 500 mg by mouth every 6 (six) hours as needed., Disp: , Rfl:    ALPRAZolam  (XANAX ) 0.5 MG tablet, Take 0.5 mg by mouth at bedtime as needed for sleep., Disp: , Rfl:    aspirin  EC 81 MG tablet, Take 1 tablet (81 mg total) by mouth daily. Swallow whole., Disp: , Rfl:    azelastine  (ASTELIN ) 0.1 % nasal spray, Place 2 sprays into both nostrils 2 (two) times daily. Use in each nostril as directed, Disp: 30 mL, Rfl: 2   Cholecalciferol  50 MCG (2000 UT) CAPS, Take 2,000 Units by mouth daily., Disp: , Rfl:    Cyanocobalamin (B-12 PO), Take by mouth daily at 6 (six) AM., Disp: , Rfl:    esomeprazole (NEXIUM) 20 MG capsule, Take 20 mg by mouth as needed., Disp: , Rfl:    metoprolol  succinate (TOPROL -XL) 50 MG 24 hr tablet, Take 1.5 tablets (75 mg total) by mouth daily., Disp: 135 tablet, Rfl: 3   zolpidem  (AMBIEN ) 5 MG tablet, Take 0.5 tablets by mouth at bedtime as needed., Disp: , Rfl:    Fluticasone -Umeclidin-Vilant (TRELEGY ELLIPTA ) 200-62.5-25 MCG/ACT AEPB, Inhale 1 puff into the lungs daily., Disp: 30 each, Rfl: 11   folic acid  (FOLVITE ) 1 MG tablet, TAKE 1 TABLET BY MOUTH EVERY DAY (Patient not taking: Reported on 06/25/2024), Disp: 90 tablet, Rfl: 3   montelukast  (SINGULAIR ) 10 MG tablet, Take 1 tablet by mouth at bedtime. (Patient not taking: Reported on 06/25/2024), Disp: , Rfl:    spironolactone  (  ALDACTONE ) 25 MG tablet, Take 25 mg by mouth daily. (Patient not taking: Reported on 06/25/2024), Disp: , Rfl:  No current facility-administered medications for this visit.  Facility-Administered Medications Ordered in Other Visits:    heparin  lock flush 100 UNIT/ML injection, , , ,    heparin  lock flush 100 UNIT/ML injection, , , ,    heparin  lock  flush 100 UNIT/ML injection, , , ,    heparin  lock flush 100 UNIT/ML injection, , , ,    heparin  lock flush 100 unit/mL, 500 Units, Intravenous, Once, Melanee Annah BROCKS, MD   sodium chloride  flush (NS) 0.9 % injection 10 mL, 10 mL, Intravenous, Once, Melanee Annah BROCKS, MD   sodium chloride  flush (NS) 0.9 % injection 10 mL, 10 mL, Intravenous, PRN, Melanee Annah BROCKS, MD, 10 mL at 01/26/21 0807   sodium chloride  flush (NS) 0.9 % injection 10 mL, 10 mL, Intravenous, Once, Melanee Annah BROCKS, MD   Subjective:   PATIENT ID: Katherine Perry GENDER: female DOB: 1955-12-23, MRN: [id number]  Chief Complaint  Patient presents with   Medical Management of Chronic Issues    Cough with clear/white phlegm. No shortness of breath or wheezing. Using Wixela daily.     HPI  Katherine Perry is a pleasant 68 year old female with history of non-small cell lung cancer s/p chemo-radiation presenting for follow up.   Patient was initially seen in clinic for the evaluation of a recurrent right sided pleural effusion requiring multiple thoracentesis. She's previously had pneumothorax ex-vacuo due to trapped lung physiology. Fluid was exudative but cytology was negative. She Does carry a diagnosis of lung cancer from 10/2020 (biopsy at that time of the right lower lobe showed non-small cell lung cancer consistent with adenocarcinoma) and received chemoradiation in coordination with oncology and radiation oncology. She underwent thoracentesis numerous times (see previous notes). She also underwent EBUS in March of 2024 where the RLL consolidation and station 11R superior were biopsied without any finding of malignancy.   She was found to have a post procedural pneumothorax following December 2023's thoracentesis for which she required a visit to the ED and a quick admission for observation. She was admitted in April to the hospital for Afib with RVR, and was found to have a small to moderate pericardial effusion. She was seen by cardiology,  continued on Eliquis , and repeat echo was ordered for follow up of the pericardial effusion which showed it to be resolved. She has since stopped taking the Eliquis .   Return Visit 12/07/2023:   She has felt well overall but has had some recurrence of her cough. She did present to our office for an acute visit in April of 2025 with increased shortness of breath and cough with streaky hemoptysis, treated for bronchitis with a course of antibiotics and inhaler, but without steroids. She continues to be short of breath with significant exertion (especially going up an inclined path). She was recently in [place]  with friends and felt ok. Her last thoracentesis was in January where 300 mL were drained with minimal improvement in symptoms. She is no longer taking Eliquis  for her Afib.   Return Visit 02/13/2024:   She is returning with main complaint of cough. The cough is worse at night, but does occur during the day. It is at times productive of sputum. She's not had any associated fevers or chills, and no chest pain. She is using her Wixela without much improvement. She did call our office in May with symptoms concerning  for reaccumulating effusion, but on US  with IR this was small and loculated, and similar to prior. Her last thoracentesis was in January of 2025 (300 mL drained). She's also had a repeat CT scan of the chest recently, ordered by her oncologist Dr. Melanee.  Return Visit 06/25/2024:  She has not been as active as she would like to be and plans to resume exercising in the near future.  Continues to have and occasional cough but without sputum production, chest pain, wheezing, or chest tightness.  She was on Trelegy which ran out and then her PCP prescribed Wixela.  She was not aware that she had refills on her Trelegy Ellipta .  She has an upcoming CT scan of the chest scheduled in December with follow-up with Dr. Melanee in January.   Patient has a history of smoking, with around 40 pack years of  smoking history. She quit in 1996.  Ancillary information including prior medications, full medical/surgical/family/social histories, and PFTs (when available) are listed below and have been reviewed.    Review of Systems  Constitutional:  Negative for chills, fever and weight loss.  Respiratory:  Positive for cough. Negative for hemoptysis, sputum production, shortness of breath and wheezing.   Cardiovascular:  Negative for chest pain.     Objective:   Vitals:   06/25/24 1133  BP: 138/70  Pulse: (!) 117  Temp: 97.9 F (36.6 C)  TempSrc: Temporal  SpO2: 100%  Weight: 147 lb 3.2 oz (66.8 kg)  Height: 5' 5 (1.651 m)   100% on RA  BMI Readings from Last 3 Encounters:  06/25/24 24.50 kg/m  02/14/24 23.26 kg/m  02/13/24 23.30 kg/m   Wt Readings from Last 3 Encounters:  06/25/24 147 lb 3.2 oz (66.8 kg)  02/14/24 139 lb 12.8 oz (63.4 kg)  02/13/24 140 lb (63.5 kg)     Physical Exam Constitutional:      Appearance: Normal appearance.  Cardiovascular:     Rate and Rhythm: Normal rate. Rhythm irregular.     Pulses: Normal pulses.     Heart sounds: Normal heart sounds.  Pulmonary:     Effort: Pulmonary effort is normal.     Breath sounds: No wheezing or rales.     Comments: Decreased air entry over the right lower lung field Neurological:     General: No focal deficit present.     Mental Status: She is alert. Mental status is at baseline.     Motor: No weakness.       Ancillary Information    Past Medical History:  Diagnosis Date   Anxiety    Asthma    Cancer (HCC)    Cough    Dyspnea    with exertion   GERD (gastroesophageal reflux disease)    Hypertension    Lung cancer (HCC)      Family History  Problem Relation Age of Onset   Heart attack Father    Heart disease Father    Heart disease Sister    Breast cancer Neg Hx      Past Surgical History:  Procedure Laterality Date   BUNIONECTOMY Bilateral    COLONOSCOPY     COLONOSCOPY WITH  PROPOFOL  N/A 07/30/2021   Procedure: COLONOSCOPY WITH PROPOFOL ;  Surgeon: Onita Elspeth Sharper, DO;  Location[facility] ENDOSCOPY;  Service: Gastroenterology;  Laterality: N/A;   ESOPHAGOGASTRODUODENOSCOPY  04/01/2006   FLEXIBLE BRONCHOSCOPY Right 10/13/2022   Procedure: FLEXIBLE BRONCHOSCOPY;  Surgeon: Isadora Hose, MD;  Location: [facility] ORS;  Service: Pulmonary;  Laterality: Right;   PORTA CATH INSERTION N/A 12/22/2020   Procedure: PORTA CATH INSERTION;  Surgeon: Marea Selinda RAMAN, MD;  Location: [facility] INVASIVE CV LAB;  Service: Cardiovascular;  Laterality: N/A;   PORTA CATH REMOVAL N/A 05/31/2022   Procedure: PORTA CATH REMOVAL;  Surgeon: Marea Selinda RAMAN, MD;  Location: [facility] INVASIVE CV LAB;  Service: Cardiovascular;  Laterality: N/A;   THORACENTESIS     VIDEO BRONCHOSCOPY WITH ENDOBRONCHIAL NAVIGATION N/A 11/07/2020   Procedure: VIDEO BRONCHOSCOPY WITH ENDOBRONCHIAL NAVIGATION;  Surgeon: Parris Manna, MD;  Location: [facility] ORS;  Service: Thoracic;  Laterality: N/A;   VIDEO BRONCHOSCOPY WITH ENDOBRONCHIAL ULTRASOUND N/A 11/07/2020   Procedure: VIDEO BRONCHOSCOPY WITH ENDOBRONCHIAL ULTRASOUND;  Surgeon: Parris Manna, MD;  Location: [facility] ORS;  Service: Thoracic;  Laterality: N/A;   VIDEO BRONCHOSCOPY WITH ENDOBRONCHIAL ULTRASOUND Right 10/13/2022   Procedure: VIDEO BRONCHOSCOPY WITH ENDOBRONCHIAL ULTRASOUND;  Surgeon: Isadora Hose, MD;  Location: [facility] ORS;  Service: Pulmonary;  Laterality: Right;    Social History   Socioeconomic History   Marital status: Married    Spouse name: Not on file   Number of children: Not on file   Years of education: Not on file   Highest education level: Not on file  Occupational History   Not on file  Tobacco Use   Smoking status: Former    Current packs/day: 0.00    Average packs/day: 1.5 packs/day for 26.8 years (40.2 ttl pk-yrs)    Types: Cigarettes    Start date: 01/19/1968    Quit date: 11/07/1994    Years since quitting: 29.6   Smokeless tobacco: Never   Vaping Use   Vaping status: Never Used  Substance and Sexual Activity   Alcohol use: Yes    Comment:  wine daily   Drug use: Never   Sexual activity: Yes  Other Topics Concern   Not on file  Social History Narrative   Lives with husband, Ryan, no pets.   Social Drivers of Corporate Investment Banker Strain: Low Risk  (10/06/2023)   Received from [facility]   Overall Financial Resource Strain (CARDIA)    Difficulty of Paying Living Expenses: Not hard at all  Food Insecurity: No Food Insecurity (10/06/2023)   Received from [facility]   Hunger Vital Sign    Within the past 12 months, you worried that your food would run out before you got the money to buy more.: Never true    Within the past 12 months, the food you bought just didn't last and you didn't have money to get more.: Never true  Transportation Needs: No Transportation Needs (10/06/2023)   Received from [facility] - Transportation    In the past 12 months, has lack of transportation kept you from medical appointments or from getting medications?: No    Lack of Transportation (Non-Medical): No  Physical Activity: Not on file  Stress: Not on file  Social Connections: Not on file  Intimate Partner Violence: Not At Risk (11/05/2022)   Humiliation, Afraid, Rape, and Kick questionnaire    Fear of Current or Ex-Partner: No    Emotionally Abused: No    Physically Abused: No    Sexually Abused: No     Allergies  Allergen Reactions   Ace Inhibitors Cough     CBC    Component Value Date/Time   WBC 5.6 11/22/2022 1019   RBC 3.64 (L) 11/22/2022 1019   HGB 11.6 (L) 11/22/2022 1019  HCT 35.1 (L) 11/22/2022 1019   PLT 527 (H) 11/22/2022 1019   MCV 96.4 11/22/2022 1019   MCH 31.9 11/22/2022 1019   MCHC 33.0 11/22/2022 1019   RDW 12.2 11/22/2022 1019   LYMPHSABS 0.9 11/22/2022 1019   MONOABS 0.6 11/22/2022 1019   EOSABS 0.0 11/22/2022 1019   BASOSABS 0.0  11/22/2022 1019    Pulmonary Functions Testing Results:     No data to display          Outpatient Medications Prior to Visit  Medication Sig Dispense Refill   acetaminophen  (TYLENOL ) 500 MG tablet Take 500 mg by mouth every 6 (six) hours as needed.     ALPRAZolam  (XANAX ) 0.5 MG tablet Take 0.5 mg by mouth at bedtime as needed for sleep.     aspirin  EC 81 MG tablet Take 1 tablet (81 mg total) by mouth daily. Swallow whole.     azelastine  (ASTELIN ) 0.1 % nasal spray Place 2 sprays into both nostrils 2 (two) times daily. Use in each nostril as directed 30 mL 2   Cholecalciferol  50 MCG (2000 UT) CAPS Take 2,000 Units by mouth daily.     Cyanocobalamin (B-12 PO) Take by mouth daily at 6 (six) AM.     esomeprazole (NEXIUM) 20 MG capsule Take 20 mg by mouth as needed.     metoprolol  succinate (TOPROL -XL) 50 MG 24 hr tablet Take 1.5 tablets (75 mg total) by mouth daily. 135 tablet 3   zolpidem  (AMBIEN ) 5 MG tablet Take 0.5 tablets by mouth at bedtime as needed.     fluticasone -salmeterol (WIXELA INHUB) 100-50 MCG/ACT AEPB Inhale 1 puff into the lungs 2 (two) times daily.     folic acid  (FOLVITE ) 1 MG tablet TAKE 1 TABLET BY MOUTH EVERY DAY (Patient not taking: Reported on 06/25/2024) 90 tablet 3   montelukast  (SINGULAIR ) 10 MG tablet Take 1 tablet by mouth at bedtime. (Patient not taking: Reported on 06/25/2024)     spironolactone  (ALDACTONE ) 25 MG tablet Take 25 mg by mouth daily. (Patient not taking: Reported on 06/25/2024)     fluticasone -salmeterol (WIXELA INHUB) 100-50 MCG/ACT AEPB Inhale 1 puff into the lungs as needed. (Patient not taking: Reported on 06/25/2024)     Fluticasone -Umeclidin-Vilant (TRELEGY ELLIPTA ) 200-62.5-25 MCG/ACT AEPB Inhale 1 puff into the lungs daily. (Patient not taking: Reported on 06/25/2024) 30 each 11   Facility-Administered Medications Prior to Visit  Medication Dose Route Frequency Provider Last Rate Last Admin   heparin  lock flush 100 UNIT/ML injection             heparin  lock flush 100 UNIT/ML injection            heparin  lock flush 100 UNIT/ML injection            heparin  lock flush 100 UNIT/ML injection            heparin  lock flush 100 unit/mL  500 Units Intravenous Once Rao, Archana C, MD       sodium chloride  flush (NS) 0.9 % injection 10 mL  10 mL Intravenous Once Rao, Archana C, MD       sodium chloride  flush (NS) 0.9 % injection 10 mL  10 mL Intravenous PRN Melanee Annah BROCKS, MD   10 mL at 01/26/21 0807   sodium chloride  flush (NS) 0.9 % injection 10 mL  10 mL Intravenous Once Rao, Archana C, MD

## 2024-07-30 ENCOUNTER — Ambulatory Visit
Admission: RE | Admit: 2024-07-30 | Discharge: 2024-07-30 | Disposition: A | Discharge status: Home / Self Care | Source: Ambulatory Visit | Attending: Oncology | Admitting: Oncology

## 2024-07-30 DIAGNOSIS — Z85118 Personal history of other malignant neoplasm of bronchus and lung: Secondary | ICD-10-CM | POA: Diagnosis present

## 2024-07-30 DIAGNOSIS — Z08 Encounter for follow-up examination after completed treatment for malignant neoplasm: Secondary | ICD-10-CM | POA: Insufficient documentation

## 2024-07-30 DIAGNOSIS — C3431 Malignant neoplasm of lower lobe, right bronchus or lung: Secondary | ICD-10-CM | POA: Insufficient documentation

## 2024-07-30 MED ORDER — IOHEXOL 300 MG/ML  SOLN
100.0000 mL | Freq: Once | INTRAMUSCULAR | Status: AC | PRN
  Administered 2024-07-30: 100 mL via INTRAVENOUS

## 2024-08-13 ENCOUNTER — Encounter: Payer: Self-pay | Admitting: Oncology

## 2024-08-13 ENCOUNTER — Inpatient Hospital Stay: Attending: Oncology | Admitting: Oncology

## 2024-08-13 ENCOUNTER — Telehealth: Payer: Self-pay | Admitting: *Deleted

## 2024-08-13 DIAGNOSIS — Z85118 Personal history of other malignant neoplasm of bronchus and lung: Secondary | ICD-10-CM

## 2024-08-13 DIAGNOSIS — Z08 Encounter for follow-up examination after completed treatment for malignant neoplasm: Secondary | ICD-10-CM

## 2024-08-13 DIAGNOSIS — C3431 Malignant neoplasm of lower lobe, right bronchus or lung: Secondary | ICD-10-CM | POA: Diagnosis not present

## 2024-08-13 NOTE — Telephone Encounter (Signed)
 Husband called triage in attempts to speak to Dr. Melanee prior to the office visit today. By the time, I was able to return phone call to pt's husband, they were already in the clinic for their scheduled apts.

## 2024-08-13 NOTE — Progress Notes (Signed)
 "    Hematology/Oncology Consult note [facility]  [phone] [phone]  Patient Care Team: Cleotilde Oneil FALCON, MD as PCP - General (Internal Medicine) Darron Deatrice LABOR, MD as PCP - Cardiology (Cardiology) Verdene Gills, RN as Oncology Nurse Navigator Isadora Hose, MD as Consulting Physician (Pulmonary Disease) Melanee Annah BROCKS, MD as Consulting Physician (Oncology) Lenn Aran, MD as Consulting Physician (Radiation Oncology)   Name of the patient: Katherine Perry  [id number]  1955/08/12   Date of visit: 08/13/2024  Diagnosis-history of stage III lung cancer currently in remission  Chief complaint/ Reason for visit-routine follow-up of lung cancer  Heme/Onc history: patient is a 69 year old female with a past medical history significant for 1-1/2 pack/day smoking for about 20 years.  She quit smoking about 26 years ago.  Other medical problems include hypertension and hyponatremia.She has been treated for iron of possible pneumonia for the last 1 year.  She has a history of intermittent asthma for which she uses Advair.  She was seen by Dr. And underwent CT chest which showed dense infiltrate/solid mass in the right lower lobe with contiguous airspace opacity in the right upper lobe.  Findings could be secondary to pneumonia but concerning for bronchogenic carcinoma.  Patient then had a PET CT scan which showed consolidation in the medial aspect of the right lower lobe measuring 3.4 x 4 cm with an SUV of 11.6.  Hypermetabolic masslike thickening in the right hilum measuring 2.2 cm with intense hypermetabolic activity.  Groundglass airspace consolidation in the posterior aspect of the right upper lobe with an SUV of 11.4.  The right upper lobe opacity was nonspecific and differentials include pulmonary infection versus lymphangitic spread of carcinoma.  CT chest showed showed masslike area of distortion involving right lower lobe middle lobe as well as  right upper lobe.  Generalized right hilar masslike appearance concerning for nodal involvement.   Patient underwent bronchoscopy and biopsies.Right lower lobe was concerning for adenocarcinoma.  Lymph node station 11 R, 4R, 7 were negative for malignancy.  No malignant cells identified in the right upper lobe.  Case was discussed at tumor board and given the hypermetabolism in the right upper lobe as well as hilum involvement cannot be ruled out despite negative biopsies.  Patient completed concurrent chemoradiation with weekly CarboTaxol in July 2022.  Scan showed partial response.  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  She is one and a half years post-completion of chemoradiation and durvalumab  for right lower lobe non-small cell lung cancer.  Last week, she experienced an episode of influenza resulting in poor oral intake, weight loss, and marked fatigue for one week. During this period, she developed significant weakness, loss of balance, and two falls. She described her legs as giving out and ambulated with an unsteady, wide-based gait. She also experienced confusion, including difficulty recalling the current month. These symptoms have gradually improved since last Friday, but she continues to have low energy and is easily winded with minimal exertion, such as walking from the car to the clinic.  She has a persistent cough refractory to multiple therapeutic interventions,  which remains a significant concern. She is maintained on Trelegy for COPD.       ECOG PS- 1 Pain scale-  0  Review of systems- Review of Systems  Constitutional:  Positive for malaise/fatigue. Negative for chills, fever and weight loss.  HENT:  Negative for congestion, ear discharge and nosebleeds.   Eyes:  Negative for blurred vision.  Respiratory:  Positive for cough. Negative for hemoptysis, sputum production, shortness of breath and wheezing.   Cardiovascular:  Negative for chest pain, palpitations, orthopnea and claudication.  Gastrointestinal:  Negative for abdominal pain, blood in stool, constipation, diarrhea, heartburn, melena, nausea and vomiting.  Genitourinary:  Negative for dysuria, flank pain, frequency, hematuria and urgency.  Musculoskeletal:  Negative for back pain, joint pain and myalgias.  Skin:  Negative for rash.  Neurological:  Negative for dizziness, tingling, focal weakness, seizures, weakness and headaches.  Endo/Heme/Allergies:  Does not bruise/bleed easily.  Psychiatric/Behavioral:  Negative for depression and suicidal ideas. The patient does not have insomnia.       Allergies[1]   Past Medical History:  Diagnosis Date   Anxiety    Asthma    Cancer (HCC)    Cough    Dyspnea    with exertion   GERD (gastroesophageal reflux disease)    Hypertension    Lung cancer [facility])      Past Surgical History:  Procedure Laterality Date   BUNIONECTOMY Bilateral    COLONOSCOPY     COLONOSCOPY WITH PROPOFOL  N/A 07/30/2021   Procedure: COLONOSCOPY WITH PROPOFOL ;  Surgeon: Onita Elspeth Sharper, DO;  Location: [facility] ENDOSCOPY;  Service: Gastroenterology;  Laterality: N/A;   ESOPHAGOGASTRODUODENOSCOPY  04/01/2006   FLEXIBLE BRONCHOSCOPY Right 10/13/2022   Procedure: FLEXIBLE BRONCHOSCOPY;  Surgeon: Isadora Hose, MD;  Location: [facility] ORS;  Service: Pulmonary;  Laterality: Right;   PORTA CATH INSERTION N/A 12/22/2020   Procedure: PORTA CATH INSERTION;  Surgeon: Marea Selinda RAMAN, MD;  Location: [facility] INVASIVE CV LAB;  Service: Cardiovascular;  Laterality: N/A;   PORTA CATH  REMOVAL N/A 05/31/2022   Procedure: PORTA CATH REMOVAL;  Surgeon: Marea Selinda RAMAN, MD;  Location: [facility] INVASIVE CV LAB;  Service: Cardiovascular;  Laterality: N/A;   THORACENTESIS     VIDEO BRONCHOSCOPY WITH ENDOBRONCHIAL NAVIGATION N/A 11/07/2020   Procedure: VIDEO BRONCHOSCOPY WITH ENDOBRONCHIAL NAVIGATION;  Surgeon: Parris Manna, MD;  Location: [facility] ORS;  Service: Thoracic;  Laterality: N/A;   VIDEO BRONCHOSCOPY WITH ENDOBRONCHIAL ULTRASOUND N/A 11/07/2020   Procedure: VIDEO BRONCHOSCOPY WITH ENDOBRONCHIAL ULTRASOUND;  Surgeon: Parris Manna, MD;  Location: [facility] ORS;  Service: Thoracic;  Laterality: N/A;   VIDEO BRONCHOSCOPY WITH ENDOBRONCHIAL ULTRASOUND Right 10/13/2022   Procedure: VIDEO BRONCHOSCOPY WITH ENDOBRONCHIAL ULTRASOUND;  Surgeon: Isadora Hose, MD;  Location: [facility] ORS;  Service: Pulmonary;  Laterality: Right;    Social History   Socioeconomic History   Marital status: Married    Spouse name: Not on file   Number of children: Not on file   Years of education: Not on file   Highest education level: Not on file  Occupational History   Not on file  Tobacco Use   Smoking status: Former    Current packs/day: 0.00    Average packs/day: 1.5 packs/day for 26.8 years (40.2 ttl pk-yrs)    Types: Cigarettes    Start date: 01/19/1968    Quit date: 11/07/1994    Years since quitting: 29.7   Smokeless tobacco: Never  Vaping Use   Vaping status: Never Used  Substance and Sexual Activity  Alcohol use: Yes    Comment:  wine daily   Drug use: Never   Sexual activity: Yes  Other Topics Concern   Not on file  Social History Narrative   Lives with husband, Ryan, no pets.   Social Drivers of Health   Tobacco Use: Medium Risk (08/13/2024)   Patient History    Smoking Tobacco Use: Former    Smokeless Tobacco Use: Never    Passive Exposure: Not on file  Financial Resource Strain: Low Risk  (10/06/2023)   Received from [facility]   Overall Financial Resource  Strain (CARDIA)    Difficulty of Paying Living Expenses: Not hard at all  Food Insecurity: No Food Insecurity (10/06/2023)   Received from [facility]   Epic    Within the past 12 months, you worried that your food would run out before you got the money to buy more.: Never true    Within the past 12 months, the food you bought just didn't last and you didn't have money to get more.: Never true  Transportation Needs: No Transportation Needs (10/06/2023)   Received from [facility] - Transportation    In the past 12 months, has lack of transportation kept you from medical appointments or from getting medications?: No    Lack of Transportation (Non-Medical): No  Physical Activity: Not on file  Stress: Not on file  Social Connections: Not on file  Intimate Partner Violence: Not At Risk (11/05/2022)   Humiliation, Afraid, Rape, and Kick questionnaire    Fear of Current or Ex-Partner: No    Emotionally Abused: No    Physically Abused: No    Sexually Abused: No  Depression (PHQ2-9): Not on file  Alcohol Screen: Not on file  Housing: Low Risk  (10/06/2023)   Received from [facility]   Epic    At any time in the past 12 months, were you homeless or living in a shelter (including now)?: No    In the past 12 months, how many times have you moved where you were living?: 0    In the last 12 months, was there a time when you were not able to pay the mortgage or rent on time?: No  Utilities: Not At Risk (10/06/2023)   Received from [facility]ities    Threatened with loss of utilities: No  Health Literacy: Not on file    Family History  Problem Relation Age of Onset   Heart attack Father    Heart disease Father    Heart disease Sister    Breast cancer Neg Hx     Current Medications[2]  Physical exam:  Vitals:   08/13/24 1113  BP: 139/87  Pulse: (!) 123  Temp: 97.9 F (36.6 C)  Weight: 143 lb 11.2  oz (65.2 kg)   Physical Exam Cardiovascular:     Rate and Rhythm: Normal rate and regular rhythm.     Heart sounds: Normal heart sounds.  Pulmonary:     Effort: Pulmonary effort is normal.     Breath sounds: Normal breath sounds.  Skin:    General: Skin is warm and dry.  Neurological:     Mental Status: She is alert and oriented to person, place, and time.      I have personally reviewed labs listed below:    Latest Ref Rng & Units 11/22/2022   10:19 AM  CMP  Glucose  70 - 99 mg/dL 91   BUN 8 - 23 mg/dL 10   Creatinine 9.55 - 1.00 mg/dL 9.14   Sodium 864 - 854 mmol/L 129   Potassium 3.5 - 5.1 mmol/L 3.5   Chloride 98 - 111 mmol/L 89   CO2 22 - 32 mmol/L 24   Calcium  8.9 - 10.3 mg/dL 8.8   Total Protein 6.5 - 8.1 g/dL 8.2   Total Bilirubin 0.3 - 1.2 mg/dL 0.7   Alkaline Phos 38 - 126 U/L 128   AST 15 - 41 U/L 19   ALT 0 - 44 U/L 13       Latest Ref Rng & Units 11/22/2022   10:19 AM  CBC  WBC 4.0 - 10.5 K/uL 5.6   Hemoglobin 12.0 - 15.0 g/dL 88.3   Hematocrit 63.9 - 46.0 % 35.1   Platelets 150 - 400 K/uL 527    I have personally reviewed Radiology images listed below: No images are attached to the encounter.  CT CHEST ABDOMEN PELVIS W CONTRAST Result Date: 08/11/2024 CLINICAL DATA:  Right lower lobe lung cancer. Restaging. * Tracking Code: BO * EXAM: CT CHEST, ABDOMEN, AND PELVIS WITH CONTRAST TECHNIQUE: Multidetector CT imaging of the chest, abdomen and pelvis was performed following the standard protocol during bolus administration of intravenous contrast. RADIATION DOSE REDUCTION: This exam was performed according to the departmental dose-optimization program which includes automated exposure control, adjustment of the mA and/or kV according to patient size and/or use of iterative reconstruction technique. CONTRAST:  OMNIPAQUE  IOHEXOL  300 MG/ML  SOLN COMPARISON:  01/13/2024 FINDINGS: CT CHEST FINDINGS Cardiovascular: The heart size is normal. No substantial  pericardial effusion. Mild atherosclerotic calcification is noted in the wall of the thoracic aorta. Mediastinum/Nodes: No mediastinal lymphadenopathy. There is no hilar lymphadenopathy. The esophagus has normal imaging features. There is no axillary lymphadenopathy. Lungs/Pleura: Volume loss in the right hemithorax is similar with apparent confluent volume loss and scarring in the parahilar right lung. Bronchus intermedius and right middle lobe airways are obliterated. Right pleural effusion with associated pleural thickening is similar to prior. Left lung remains clear without focal consolidation or suspicious pulmonary nodule or mass. No left-sided effusion. Musculoskeletal: Multiple posterior right rib fractures again noted. Superior endplate compression deformity at T11 was evaluated by MRI 01/18/2024 insert as no substantial change since prior CT. CT ABDOMEN PELVIS FINDINGS Hepatobiliary: No suspicious focal abnormality within the liver parenchyma. There is no evidence for gallstones, gallbladder wall thickening, or pericholecystic fluid. No intrahepatic or extrahepatic biliary dilation. Pancreas: No focal mass lesion. No dilatation of the main duct. No intraparenchymal cyst. No peripancreatic edema. Spleen: No splenomegaly. No suspicious focal mass lesion. Adrenals/Urinary Tract: No adrenal nodule or mass. Kidneys unremarkable. No evidence for hydroureter. The urinary bladder appears normal for the degree of distention. Stomach/Bowel: Stomach is unremarkable. No gastric wall thickening. No evidence of outlet obstruction. Duodenum is normally positioned as is the ligament of Treitz. No small bowel wall thickening. No small bowel dilatation. The appendix is normal. No gross colonic mass. No colonic wall thickening. Vascular/Lymphatic: There is moderate atherosclerotic calcification of the abdominal aorta without aneurysm. There is no gastrohepatic or hepatoduodenal ligament lymphadenopathy. No retroperitoneal  or mesenteric lymphadenopathy. No pelvic sidewall lymphadenopathy. Reproductive: Unremarkable. Other: No substantial intraperitoneal free fluid. Musculoskeletal: No worrisome lytic or sclerotic osseous abnormality. IMPRESSION: 1. Stable exam. No new or progressive findings to suggest recurrent or metastatic disease in the chest, abdomen, or pelvis. 2. Stable volume loss in the right hemithorax  with apparent confluent collapse and scarring in the parahilar right lung. Bronchus intermedius and right middle lobe airways are obliterated as before. 3. Stable right pleural effusion with associated loculation and pleural thickening. 4.  Aortic Atherosclerosis (ICD10-I70.0). Electronically Signed   By: Camellia Candle M.D.   On: 08/11/2024 08:03     Assessment and plan- Patient is a 69 y.o. 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  She is s/p concurrent CarboTaxol chemotherapy followed by 1 year of maintenance durvalumab  ending in July 2023. She is here to discuss CT scan results and further management  I have reviewed CT chest abdomen and pelvis images independently and discussed findings with the patient.  CT shows chronic radiation changes in the right lower lobe as well as small loculated right pleural effusion which remains unchanged.  No evidence of recurrent or progressive disease noted at this time.  She continues to follow-up with pulmonary Dr. Isadora for COPD as well.  I will see her back in 6 months with scans prior   Visit Diagnosis 1. Encounter for follow-up surveillance of lung cancer   2. Malignant neoplasm of lower lobe of right lung (HCC)      Dr. Annah Skene, MD, MPH [facility] [phone] 08/13/2024 3:22 PM                   [1]  Allergies Allergen Reactions   Ace Inhibitors Cough  [2]  Current Outpatient Medications:    acetaminophen  (TYLENOL ) 500 MG tablet, Take 500 mg by mouth every 6 (six) hours as  needed., Disp: , Rfl:    ALPRAZolam  (XANAX ) 0.5 MG tablet, Take 0.5 mg by mouth at bedtime as needed for sleep., Disp: , Rfl:    aspirin  EC 81 MG tablet, Take 1 tablet (81 mg total) by mouth daily. Swallow whole., Disp: , Rfl:    azelastine  (ASTELIN ) 0.1 % nasal spray, Place 2 sprays into both nostrils 2 (two) times daily. Use in each nostril as directed, Disp: 30 mL, Rfl: 2   Cholecalciferol  50 MCG (2000 UT) CAPS, Take 2,000 Units by mouth daily., Disp: , Rfl:    Cyanocobalamin  (B-12 PO), Take by mouth daily at 6 (six) AM., Disp: , Rfl:    esomeprazole (NEXIUM) 20 MG capsule, Take 20 mg by mouth as needed., Disp: , Rfl:    Fluticasone -Umeclidin-Vilant (TRELEGY ELLIPTA ) 200-62.5-25 MCG/ACT AEPB, Inhale 1 puff into the lungs daily., Disp: 30 each, Rfl: 11   levofloxacin (LEVAQUIN) 500 MG tablet, Take 500 mg by mouth., Disp: , Rfl:    metoprolol  succinate (TOPROL -XL) 50 MG 24 hr tablet, Take 1.5 tablets (75 mg total) by mouth daily., Disp: 135 tablet, Rfl: 3   predniSONE  (DELTASONE ) 20 MG tablet, Take 20 mg by mouth., Disp: , Rfl:    zolpidem  (AMBIEN ) 5 MG tablet, Take 0.5 tablets by mouth at bedtime as needed., Disp: , Rfl:    folic acid  (FOLVITE ) 1 MG tablet, TAKE 1 TABLET BY MOUTH EVERY DAY (Patient not taking: Reported on 08/13/2024), Disp: 90 tablet, Rfl: 3   montelukast  (SINGULAIR ) 10 MG tablet, Take 1 tablet by mouth at bedtime. (Patient not taking: Reported on 08/13/2024), Disp: , Rfl:    spironolactone  (ALDACTONE ) 25 MG tablet, Take 25 mg by mouth daily. (Patient not taking: Reported on 08/13/2024), Disp: , Rfl:  No current facility-administered medications for this visit.  Facility-Administered Medications Ordered in Other Visits:    heparin  lock flush 100  UNIT/ML injection, , , ,    heparin  lock flush 100 UNIT/ML injection, , , ,    heparin  lock flush 100 UNIT/ML injection, , , ,    heparin  lock flush 100 UNIT/ML injection, , , ,    heparin  lock flush 100 unit/mL, 500 Units, Intravenous,  Once, Melanee Annah BROCKS, MD   sodium chloride  flush (NS) 0.9 % injection 10 mL, 10 mL, Intravenous, Once, Melanee Annah BROCKS, MD   sodium chloride  flush (NS) 0.9 % injection 10 mL, 10 mL, Intravenous, PRN, Melanee Annah BROCKS, MD, 10 mL at 01/26/21 0807   sodium chloride  flush (NS) 0.9 % injection 10 mL, 10 mL, Intravenous, Once, Melanee Annah BROCKS, MD  "

## 2024-08-14 ENCOUNTER — Other Ambulatory Visit: Payer: Self-pay | Admitting: Internal Medicine

## 2024-08-14 DIAGNOSIS — C3491 Malignant neoplasm of unspecified part of right bronchus or lung: Secondary | ICD-10-CM

## 2024-08-14 DIAGNOSIS — G9340 Encephalopathy, unspecified: Secondary | ICD-10-CM

## 2024-08-16 ENCOUNTER — Ambulatory Visit
Admission: RE | Admit: 2024-08-16 | Discharge: 2024-08-16 | Disposition: A | Discharge status: Home / Self Care | Source: Ambulatory Visit | Attending: Internal Medicine | Admitting: Internal Medicine

## 2024-08-16 DIAGNOSIS — C3491 Malignant neoplasm of unspecified part of right bronchus or lung: Secondary | ICD-10-CM | POA: Insufficient documentation

## 2024-08-16 DIAGNOSIS — G9340 Encephalopathy, unspecified: Secondary | ICD-10-CM | POA: Diagnosis present

## 2024-08-16 MED ORDER — IOHEXOL 300 MG/ML  SOLN
75.0000 mL | Freq: Once | INTRAMUSCULAR | Status: AC | PRN
  Administered 2024-08-16: 75 mL via INTRAVENOUS

## 2024-08-17 ENCOUNTER — Other Ambulatory Visit: Payer: Self-pay | Admitting: Internal Medicine

## 2024-08-17 DIAGNOSIS — R9089 Other abnormal findings on diagnostic imaging of central nervous system: Secondary | ICD-10-CM

## 2024-08-17 DIAGNOSIS — I609 Nontraumatic subarachnoid hemorrhage, unspecified: Secondary | ICD-10-CM

## 2024-08-19 ENCOUNTER — Ambulatory Visit
Admission: RE | Admit: 2024-08-19 | Discharge: 2024-08-19 | Disposition: A | Discharge status: Home / Self Care | Source: Ambulatory Visit | Attending: Internal Medicine | Admitting: Internal Medicine

## 2024-08-19 ENCOUNTER — Ambulatory Visit
Admission: RE | Admit: 2024-08-19 | Discharge: 2024-08-19 | Disposition: A | Source: Ambulatory Visit | Attending: Internal Medicine | Admitting: Internal Medicine

## 2024-08-19 DIAGNOSIS — R9089 Other abnormal findings on diagnostic imaging of central nervous system: Secondary | ICD-10-CM | POA: Insufficient documentation

## 2024-08-19 DIAGNOSIS — I609 Nontraumatic subarachnoid hemorrhage, unspecified: Secondary | ICD-10-CM | POA: Diagnosis present

## 2024-08-19 MED ORDER — GADOBUTROL 1 MMOL/ML IV SOLN
6.0000 mL | Freq: Once | INTRAVENOUS | Status: AC | PRN
  Administered 2024-08-19: 6 mL via INTRAVENOUS

## 2024-08-21 ENCOUNTER — Telehealth: Payer: Self-pay | Admitting: *Deleted

## 2024-08-21 NOTE — Telephone Encounter (Signed)
 1229-Kelly from Dr. Oneil Couch office called to let our team know that she just spoke with pt's husband. Burnard will reach out to Dr. Cleotilde to provide Dr. Darold recommendations regarding the plan of care.

## 2024-08-21 NOTE — Telephone Encounter (Signed)
" °  Per Dr. Melanee, She has spoken to  Dr. Rosslyn and Dr. Penne Sharps with [facility] Neurosurgery department. Patient does not need go to ER at this time. Patient will need a high volume LP with cytology first; this is coating the subarachnoid space. If an LP confirms malignant cells, it would obviate the need for an open biopsy. Rosaline with Neurosurgery will reach out to the patient/pt's husband this afternoon to set up an apt in [place] as soon as possible.  1144-I spoke with pt's husband/pt, who confirmed understanding of the plan of care. per husband, pt is currently waiting on Dr. Oneil Pinal to return their phone call as pcp was initially referring pt to [facility]. pt/husband aware that neurosurgery will be reaching out and has indicated that pt is open to apts at [facility]. pt will make a decision on the direction of the referral after discussing it further w/pcp. I informed pt/husband that Dr. Melanee will personally reach out to them later this afternoon to answer any additional questions or concerns. At this time, patient's denies any focal weakness. "

## 2024-08-21 NOTE — Telephone Encounter (Signed)
 Incoming call from Burnard Custard, RN-Dr. Oneil Pinal is agreeable to Dr. Darold recommendations.

## 2024-08-21 NOTE — Telephone Encounter (Signed)
 Patient has a consult apt on Friday 9:40 AM with [facility] Neurosurgery.

## 2024-08-21 NOTE — Telephone Encounter (Signed)
 Caller verified using pt's full name and dob prior to discussing PHI    Husband called to inform Dr. Melanee that his wife had a brain MRI (ordered by Dr. Oneil Pinal. He wanted to Dr. Melanee to review the report. He stated that Dr. Pinal ordered CT report on 08/16/2024 and this MRI on 08/19/24 due to balance issues. Patient's husband Dr. Pinal put in a referral to [facility] Neurosurgery, but patient has not heard back from Dr. Dianne office. Husband would like Dr. Darold opinion on whether pt should go to [facility] ER to expedite the referral. He is also calling Dr. Dianne office back now to see if he can get an update.    CT IMPRESSION: 1. Positive for combined: Small volume right sylvian fissure subarachnoid hemorrhage and adjacent New right temporal stem hypodensity. These are nonspecific. Small SAH can be seen after falls in the elderly. And right temporal lobe might possibly reflect encephalomalacia or contusion. But recommend follow-up Brain MRI without and with contrast to exclude metastatic disease (compare to prior in 2022) plus a non-contrast Head MRA (to exclude the possibility of aneurysm). 2. No intracranial mass effect or midline shift. And no other recent posttraumatic finding.   MRI IMPRESSION: 1. Solitary and unusual Right temporal lobe ring-enhancing intra-axial mass (2.4 cm) with edema. No significant mass effect or hemorrhage. Although this is atypical for a cerebral metastasis, that possibility is not excluded. Infectious or inflammatory encephalitis, or a primary or infiltrative neoplasm are the main differential considerations. No evidence of cerebral abscess. And No other acute intracranial abnormality. 2. Right mastoid and tympanic cavity effusion, consider right otitis media with mastoid effusion.

## 2024-08-23 ENCOUNTER — Telehealth: Payer: Self-pay | Admitting: Oncology

## 2024-08-23 NOTE — Telephone Encounter (Signed)
 Pt spouse called to get CT r/s - will be on vacation - pt spouse confirmed new date/time - requested appt reminder via mail - LH

## 2024-08-24 ENCOUNTER — Ambulatory Visit: Admitting: Neurosurgery

## 2024-08-24 ENCOUNTER — Encounter: Payer: Self-pay | Admitting: Neurosurgery

## 2024-08-24 VITALS — BP 151/66 | HR 100 | Temp 98.1°F | Ht 64.0 in | Wt 139.0 lb

## 2024-08-24 DIAGNOSIS — G936 Cerebral edema: Secondary | ICD-10-CM | POA: Diagnosis not present

## 2024-08-24 DIAGNOSIS — R930 Abnormal findings on diagnostic imaging of skull and head, not elsewhere classified: Secondary | ICD-10-CM

## 2024-08-24 DIAGNOSIS — D496 Neoplasm of unspecified behavior of brain: Secondary | ICD-10-CM

## 2024-08-24 NOTE — Progress Notes (Unsigned)
 " Assessment : The patient is a 69 year old female with a significant medical history of lung cancer with effusion, atrial flutter with RVR, asthma, and lumbar disc disease.  Approximately 3 weeks ago she contracted the flu, which was accompanied by lethargy, poor appetite, slurred speech, and a fall due to poor balance.  She ended up going to [facility] on 08/19/2024 where she got a CT that revealed some blood in the subarachnoid space and a concern for possible mass effect in the area. MRI and MRA that identified a small right temporal ring-enhancing lesion with edema.  Although the patient was initially referred to [facility] for emergent follow-up she was unable to secure an appointment quick enough and presents here today for evaluation.  The patient reports feeling she has returned to baseline however, her husband notes persistent mild cognitive confusion and a slight abnormality in her gait though appearing to be improving daily. She denies any concerns at this time including headache, seizure like activity, N/V, changes in vision, changes in hearing. She is currently refraining from driving.  The family is inquiring about the necessity of a lumbar puncture with cytology considering her previous diagnosis of lung cancer.  Plan : I was able to get clarification regarding the course of events: Cognitive and gait decline have been for about a week prior to her falling and when she fell she never hit her head.  They went to her primary care doctor, Dr. Cleotilde, for the cognitive and gait decline and not so much for the fall.  They mentioned as a by the way but the CT scan was done because of the cognitive decline.  The CT demonstrated hyperintensity in the right sylvian fissure prompting the MRA and an MRI with contrast and the MRI with contrast demonstrates this lining of enhancing tissue in the right sylvian fissure with flair signal around it.  Given her history of lung cancer, concern was raised to Dr. Melanee,  oncologist, who subsequently contacted us  and we made a plan.  I went over the course of events and options with her and her husband.  I told them that the first option is to do nothing, second option being do serial imaging and the third option would be is to start with a lumbar puncture.  I think with this enhancing tissue lining the sylvian fissure, it could be possible to get a diagnosis if there is shedding of cells in the CSF and we could evaluate this with a lumbar puncture done at high-volume sending the CSF out for cytology.  If the cytology is positive then we will act accordingly but if it is negative, then we can decide whether to do a short interval follow-up image versus a biopsy which would be open given the tedious location around the middle cerebral artery vasculature.  I explained this in great detail to them repeatedly with examples that speak more to them as in daily life and they are in agreement with this plan.  I will order the lumbar puncture and I will see them back after we have the results from the cytology.   Social History   Socioeconomic History   Marital status: Married    Spouse name: Not on file   Number of children: Not on file   Years of education: Not on file   Highest education level: Not on file  Occupational History   Not on file  Tobacco Use   Smoking status: Former    Current packs/day: 0.00  Average packs/day: 1.5 packs/day for 26.8 years (40.2 ttl pk-yrs)    Types: Cigarettes    Start date: 01/19/1968    Quit date: 11/07/1994    Years since quitting: 29.8   Smokeless tobacco: Never  Vaping Use   Vaping status: Never Used  Substance and Sexual Activity   Alcohol use: Yes    Comment:  wine daily   Drug use: Never   Sexual activity: Yes  Other Topics Concern   Not on file  Social History Narrative   Lives with husband, Ryan, no pets.   Social Drivers of Health   Tobacco Use: Medium Risk (08/24/2024)   Patient History    Smoking  Tobacco Use: Former    Smokeless Tobacco Use: Never    Passive Exposure: Not on file  Financial Resource Strain: Low Risk  (10/06/2023)   Received [facility]   Overall Financial Resource Strain (CARDIA)    Difficulty of Paying Living Expenses: Not hard at all  Food Insecurity: No Food Insecurity (10/06/2023)   Received from [facility]   Epic    Within the past 12 months, you worried that your food would run out before you got the money to buy more.: Never true    Within the past 12 months, the food you bought just didn't last and you didn't have money to get more.: Never true  Transportation Needs: No Transportation Needs (10/06/2023)   Received from [facility] - Transportation    In the past 12 months, has lack of transportation kept you from medical appointments or from getting medications?: No    Lack of Transportation (Non-Medical): No  Physical Activity: Not on file  Stress: Not on file  Social Connections: Not on file  Intimate Partner Violence: Not At Risk (11/05/2022)   Humiliation, Afraid, Rape, and Kick questionnaire    Fear of Current or Ex-Partner: No    Emotionally Abused: No    Physically Abused: No    Sexually Abused: No  Depression (PHQ2-9): Not on file  Alcohol Screen: Not on file  Housing: Low Risk  (10/06/2023)   Received from [facility]   Epic    At any time in the past 12 months, were you homeless or living in a shelter (including now)?: No    In the past 12 months, how many times have you moved where you were living?: 0    In the last 12 months, was there a time when you were not able to pay the mortgage or rent on time?: No  Utilities: Not At Risk (10/06/2023)   Received from [facility] Utilities    Threatened with loss of utilities: No  Health Literacy: Not on file    Family History  Problem Relation Age of Onset   Heart attack Father    Heart  disease Father    Heart disease Sister    Breast cancer Neg Hx     Allergies[1]  Past Medical History:  Diagnosis Date   Anxiety    Asthma    Cancer (HCC)    Cough    Dyspnea    with exertion   GERD (gastroesophageal reflux disease)    Hypertension    Lung cancer [facility])     Past Surgical History:  Procedure Laterality Date   BUNIONECTOMY Bilateral    COLONOSCOPY     COLONOSCOPY WITH PROPOFOL  N/A 07/30/2021   Procedure: COLONOSCOPY  WITH PROPOFOL ;  Surgeon: Onita Elspeth Sharper, DO;  Location: [facility] ENDOSCOPY;  Service: Gastroenterology;  Laterality: N/A;   ESOPHAGOGASTRODUODENOSCOPY  04/01/2006   FLEXIBLE BRONCHOSCOPY Right 10/13/2022   Procedure: FLEXIBLE BRONCHOSCOPY;  Surgeon: Isadora Hose, MD;  Location: [facility] ORS;  Service: Pulmonary;  Laterality: Right;   PORTA CATH INSERTION N/A 12/22/2020   Procedure: PORTA CATH INSERTION;  Surgeon: Marea Selinda RAMAN, MD;  Location: [facility] INVASIVE CV LAB;  Service: Cardiovascular;  Laterality: N/A;   PORTA CATH REMOVAL N/A 05/31/2022   Procedure: PORTA CATH REMOVAL;  Surgeon: Marea Selinda RAMAN, MD;  Location: [facility] INVASIVE CV LAB;  Service: Cardiovascular;  Laterality: N/A;   THORACENTESIS     VIDEO BRONCHOSCOPY WITH ENDOBRONCHIAL NAVIGATION N/A 11/07/2020   Procedure: VIDEO BRONCHOSCOPY WITH ENDOBRONCHIAL NAVIGATION;  Surgeon: Parris Manna, MD;  Location: [facility] ORS;  Service: Thoracic;  Laterality: N/A;   VIDEO BRONCHOSCOPY WITH ENDOBRONCHIAL ULTRASOUND N/A 11/07/2020   Procedure: VIDEO BRONCHOSCOPY WITH ENDOBRONCHIAL ULTRASOUND;  Surgeon: Parris Manna, MD;  Location: [facility] ORS;  Service: Thoracic;  Laterality: N/A;   VIDEO BRONCHOSCOPY WITH ENDOBRONCHIAL ULTRASOUND Right 10/13/2022   Procedure: VIDEO BRONCHOSCOPY WITH ENDOBRONCHIAL ULTRASOUND;  Surgeon: Isadora Hose, MD;  Location: [facility] ORS;  Service: Pulmonary;  Laterality: Right;     Physical Exam   Physical Exam HENT:     Head: Normocephalic.     Nose: Nose normal.  Eyes:      Pupils: Pupils are equal, round, and reactive to light.  Cardiovascular:     Rate and Rhythm: Normal rate.  Pulmonary:     Effort: Pulmonary effort is normal.  Abdominal:     General: Abdomen is flat.  Musculoskeletal:     Cervical back: Normal range of motion.  Neurological:     Mental Status: Patient is alert.     Cranial Nerves: Cranial nerves 2-12 are intact.     Sensory: Sensation is intact.     Motor: Motor function is intact.     Coordination: Coordination is intact.     Results for orders placed or performed during the hospital encounter of 08/19/24  MR ANGIO HEAD WO CONTRAST   Narrative   EXAM: MR Angiography Head without intravenous Contrast. 08/19/2024 10:27:34 AM  TECHNIQUE: Magnetic resonance angiography images of the head without intravenous contrast. Multiplanar 2D and 3D reformatted images are provided for review.  COMPARISON: Recent CT head for confusion, lung cancer (treatment completed in 2023 and thought to be in remission), and falls with normal right sylvian fissure and temporal lobe, including evidence of trace subarachnoid blood. Brain MRI today reported separately.  CLINICAL HISTORY: 69 year old female with recent influenza-like illness, confusion, lung cancer (thought in remission), and falls. Follow-up for recent abnormal head CT with possible Right Syvian fissure trace SAH  FINDINGS:  ANTERIOR CIRCULATION: No significant stenosis of the internal carotid arteries. Dominant left and diminutive right ACA A1 segments (normal variant). Right MCA branches in the sylvian fissure appear to remain normal. Normal anterior communicating artery. Diminutive or absent left posterior communicating artery. Normal ophthalmic artery origins. No aneurysm.  POSTERIOR CIRCULATION: Fetal type right PCA origin (normal variation). No significant stenosis of the basilar artery. No significant stenosis of the vertebral arteries. Normal superior cerebellar  artery (SCA) origins. No aneurysm.   IMPRESSION: 1. Negative intracranial MRA.  Normal vessel appearance for age. 2. See abnormal Brain MRI reported separately today.  Electronically signed by: Helayne Hurst MD 08/19/2024 11:17 AM EST RP Workstation: [id number]   Results for orders placed or performed during  the hospital encounter of 08/19/24  MR BRAIN W WO CONTRAST   Addendum: 08/19/2024   ******** ADDENDUM #1 ******** ADDENDUM: Study discussed by telephone with Dr Epifanio at 1144 hours on 08/19/2024.  ----------------------------------------------------  Electronically signed by: Helayne Hurst MD 08/19/2024 11:53 AM EST RP Workstation: [id number]     Narrative   ******** ORIGINAL REPORT ******** EXAM: MRI BRAIN WITH AND WITHOUT CONTRAST 08/19/2024 10:27:10 AM  TECHNIQUE: Multiplanar multisequence MRI of the head/brain was performed with and without the administration of intravenous contrast. 6 mL gadobutrol  (GADAVIST ) 1 MMOL/ML injection.  COMPARISON: Non-contrast head CT 08/16/2024, Previous brain MRI 12/12/2020, and MRI head today (reported separately).  CLINICAL HISTORY: 69 year old female. Recent influenza-like illness. Recent head CT for confusion, lung cancer (treatment completed in 2023 and thought to be in remission), and falls with normal right sylvian fissure and temporal lobe, including evidence of trace subarachnoid blood. Follow up.  FINDINGS:  BRAIN AND VENTRICLES: Abnormal lobulated, irregular, rim enhancing mass at the right temporal stem and sylvian fissure, measuring 24 x 13 x 17 mm (AP x transverse x CC). This appears to be intraaxial but extending to the cortex of the posterior superior right temporal gyrus, temporal stem, and adjacent posterior right insula. T2 and FLAIR hyperintense tumoral edema is present in this region (series 9, image 25). Only mildly abnormal diffusion weighted imaging (series 5, image 76) with primarily facilitated  diffusion. At least if any evidence of blood and hemosiderin on SWI (series 12, image 29) corresponding to the abnormal right temporal stem and sylvian fissure. No significant regional mass effect. No midline shift. No other similar lesion or abnormal enhancement. No other cerebral edema. In general, the subarachnoid spaces appear normal on MRI. No intraventricular debris. No hydrocephalus. The sella is unremarkable.  No acute infarct or diffusion restriction. Normal flow voids. Postcontrast, the major dural venous sinuses are enhancing and patent. No convincing abnormal ventricular or dural enhancement. Scattered chronic periventricular and less pronounced pontine T2 / FLAIR hyperintensity is chronic and mildly progressed since 2022. On SWI, there is a possible chronic microhemorrhage in the cerebellum which is new since 2022 (series 12, image 15). No other intracranial blood products.  ORBITS: No significant abnormality.  SINUSES: Paranasal sinuses are well aerated. Right mastoid and tympanic cavity fluid signal is new since 2022, similar to recent CT. Other visible internal auditory structures appear negative.  BONES AND SOFT TISSUES: Normal bone marrow signal. No soft tissue abnormality. Partially visible cervical spine degeneration. Negative visible spinal cord.  IMPRESSION: 1. Solitary and unusual Right temporal lobe ring-enhancing intra-axial mass (2.4 cm) with edema. No significant mass effect or hemorrhage. Although this is atypical for a cerebral metastasis, that possibility is not excluded. Infectious or inflammatory encephalitis, or a primary or infiltrative neoplasm are the main differential considerations. No evidence of cerebral abscess. And No other acute intracranial abnormality. 2. Right mastoid and tympanic cavity effusion, consider right otitis media with mastoid effusion.  Electronically signed by: Helayne Hurst MD 08/19/2024 11:14 AM EST RP Workstation:  [id number]   Results for orders placed or performed during the hospital encounter of 08/16/24  CT HEAD W & WO CONTRAST ( )   Addendum: 08/16/2024   ******** ADDENDUM #1 ******** ADDENDUM: Study discussed by telephone with Dr. Cleotilde at 1110 hours on August 16, 2024.  ----------------------------------------------------  Electronically signed by: Helayne Hurst MD 08/16/2024 11:15 AM EST RP Workstation: [id number]     Narrative   ******** ORIGINAL REPORT ******** EXAM: CT HEAD WITHOUT AND WITH CONTRAST  08/16/2024 10:38:42 AM  TECHNIQUE: CT of the head was performed without and with the administration of intravenous contrast. Automated exposure control, iterative reconstruction, and/or weight based adjustment of the mA/kV was utilized to reduce the radiation dose to as low as reasonably achievable.  COMPARISON: Brain MRI 12/12/2020.  CLINICAL HISTORY: 69 year old female with primary lung cancer, intermittent confusion, and falls in the past 2 weeks.  FINDINGS:  BRAIN AND VENTRICLES: There is a small volume of hyperdense subarachnoid hemorrhage in the right sylvian fissure on series 2 image 15. Subjacent hypodensity in the right temporal stem is new since 2022, confluent although not definitely masslike. Encephalomalacia or contusion might also appear similar. No other acute intracranial hemorrhage identified prior to contrast. No mass effect or midline shift. No evidence of acute infarct. Some generalized cerebral volume loss since 2022, mild ex vacuo ventricular enlargement. Scattered and patchy cerebral white matter hypodensity appears chronic, including in the left corona radiata and frontal operculum (series 2 image 20), but also is progressed since 2022. No definite cortical encephalomalacia. Other gray white differentiation maintained. Basilar cisterns appear normal.  After Contrast the major intracranial vascular structures are enhancing as expected. No abnormal  parenchymal enhancement identified.  ORBITS: No acute abnormality.  SINUSES AND MASTOIDS: Mild right mastoid opacification/effusion is new since 2022. The right tympanic cavity appears to remain aerated. Left tympanic cavity, left mastoids, and visible paranasal sinuses are also well aerated.  SOFT TISSUES AND SKULL: Mild for age calcified atherosclerosis at the skull base. Skull bone mineralization within normal limits. No focal bone lesion. No acute soft tissue abnormality.  IMPRESSION: 1. Positive for combined: Small volume right sylvian fissure subarachnoid hemorrhage and adjacent New right temporal stem hypodensity. These are nonspecific. Small SAH can be seen after falls in the elderly. And right temporal lobe might possibly reflect encephalomalacia or contusion. But recommend follow-up Brain MRI without and with contrast to exclude metastatic disease (compare to prior in 2022) plus a non-contrast Head MRA (to exclude the possibility of aneurysm). 2. No intracranial mass effect or midline shift. And no other recent posttraumatic finding.  Electronically signed by: Helayne Hurst MD 08/16/2024 11:07 AM EST RP Workstation: [id number]        [1]  Allergies Allergen Reactions   Ace Inhibitors Cough   "

## 2024-08-30 NOTE — Discharge Instructions (Signed)

## 2024-08-31 ENCOUNTER — Ambulatory Visit
Admission: RE | Admit: 2024-08-31 | Discharge: 2024-08-31 | Disposition: A | Source: Ambulatory Visit | Attending: Neurosurgery | Admitting: Neurosurgery

## 2024-08-31 VITALS — BP 177/93 | HR 110

## 2024-08-31 DIAGNOSIS — D496 Neoplasm of unspecified behavior of brain: Secondary | ICD-10-CM

## 2024-09-03 LAB — CYTOLOGY - NON PAP

## 2024-09-07 ENCOUNTER — Ambulatory Visit: Admitting: Neurosurgery

## 2024-09-07 ENCOUNTER — Other Ambulatory Visit: Payer: Self-pay

## 2024-09-07 ENCOUNTER — Encounter: Payer: Self-pay | Admitting: Hospice and Palliative Medicine

## 2024-09-07 ENCOUNTER — Emergency Department

## 2024-09-07 ENCOUNTER — Inpatient Hospital Stay

## 2024-09-07 ENCOUNTER — Inpatient Hospital Stay: Attending: Oncology

## 2024-09-07 ENCOUNTER — Inpatient Hospital Stay: Admitting: Hospice and Palliative Medicine

## 2024-09-07 ENCOUNTER — Encounter: Payer: Self-pay | Admitting: Emergency Medicine

## 2024-09-07 ENCOUNTER — Inpatient Hospital Stay: Admission: EM | Admit: 2024-09-07 | Source: Home / Self Care | Admitting: Internal Medicine

## 2024-09-07 ENCOUNTER — Telehealth: Payer: Self-pay

## 2024-09-07 ENCOUNTER — Encounter: Payer: Self-pay | Admitting: Neurosurgery

## 2024-09-07 VITALS — BP 150/92 | HR 125 | Temp 98.2°F | Ht 64.0 in | Wt 132.0 lb

## 2024-09-07 VITALS — BP 180/93 | HR 125 | Temp 97.7°F | Resp 16 | Wt 132.0 lb

## 2024-09-07 DIAGNOSIS — J4489 Other specified chronic obstructive pulmonary disease: Secondary | ICD-10-CM

## 2024-09-07 DIAGNOSIS — I480 Paroxysmal atrial fibrillation: Secondary | ICD-10-CM | POA: Diagnosis present

## 2024-09-07 DIAGNOSIS — D496 Neoplasm of unspecified behavior of brain: Secondary | ICD-10-CM

## 2024-09-07 DIAGNOSIS — R000 Tachycardia, unspecified: Secondary | ICD-10-CM

## 2024-09-07 DIAGNOSIS — Z85118 Personal history of other malignant neoplasm of bronchus and lung: Secondary | ICD-10-CM

## 2024-09-07 DIAGNOSIS — C3431 Malignant neoplasm of lower lobe, right bronchus or lung: Secondary | ICD-10-CM | POA: Diagnosis present

## 2024-09-07 DIAGNOSIS — G9389 Other specified disorders of brain: Secondary | ICD-10-CM

## 2024-09-07 DIAGNOSIS — R21 Rash and other nonspecific skin eruption: Secondary | ICD-10-CM

## 2024-09-07 DIAGNOSIS — F419 Anxiety disorder, unspecified: Secondary | ICD-10-CM | POA: Diagnosis present

## 2024-09-07 DIAGNOSIS — I10 Essential (primary) hypertension: Secondary | ICD-10-CM | POA: Diagnosis present

## 2024-09-07 DIAGNOSIS — B0230 Zoster ocular disease, unspecified: Principal | ICD-10-CM | POA: Diagnosis present

## 2024-09-07 DIAGNOSIS — R531 Weakness: Secondary | ICD-10-CM

## 2024-09-07 DIAGNOSIS — J45909 Unspecified asthma, uncomplicated: Secondary | ICD-10-CM | POA: Diagnosis present

## 2024-09-07 LAB — CBC WITH DIFFERENTIAL/PLATELET
Abs Immature Granulocytes: 0.11 10*3/uL — ABNORMAL HIGH (ref 0.00–0.07)
Abs Immature Granulocytes: 0.08 10*3/uL — ABNORMAL HIGH (ref 0.00–0.07)
Basophils Absolute: 0 10*3/uL (ref 0.0–0.1)
Basophils Absolute: 0 10*3/uL (ref 0.0–0.1)
Basophils Relative: 0 %
Basophils Relative: 0 %
Eosinophils Absolute: 0 10*3/uL (ref 0.0–0.5)
Eosinophils Absolute: 0 10*3/uL (ref 0.0–0.5)
Eosinophils Relative: 0 %
Eosinophils Relative: 0 %
HCT: 31.1 % — ABNORMAL LOW (ref 36.0–46.0)
HCT: 29.8 % — ABNORMAL LOW (ref 36.0–46.0)
Hemoglobin: 10.1 g/dL — ABNORMAL LOW (ref 12.0–15.0)
Hemoglobin: 9.7 g/dL — ABNORMAL LOW (ref 12.0–15.0)
Immature Granulocytes: 1 %
Immature Granulocytes: 1 %
Lymphocytes Relative: 4 %
Lymphocytes Relative: 5 %
Lymphs Abs: 0.6 10*3/uL — ABNORMAL LOW (ref 0.7–4.0)
Lymphs Abs: 0.5 10*3/uL — ABNORMAL LOW (ref 0.7–4.0)
MCH: 29.8 pg (ref 26.0–34.0)
MCH: 29.8 pg (ref 26.0–34.0)
MCHC: 32.5 g/dL (ref 30.0–36.0)
MCHC: 32.6 g/dL (ref 30.0–36.0)
MCV: 91.7 fL (ref 80.0–100.0)
MCV: 91.7 fL (ref 80.0–100.0)
Monocytes Absolute: 0.6 10*3/uL (ref 0.1–1.0)
Monocytes Absolute: 0.5 10*3/uL (ref 0.1–1.0)
Monocytes Relative: 5 %
Monocytes Relative: 5 %
Neutro Abs: 11.4 10*3/uL — ABNORMAL HIGH (ref 1.7–7.7)
Neutro Abs: 8.9 10*3/uL — ABNORMAL HIGH (ref 1.7–7.7)
Neutrophils Relative %: 90 %
Neutrophils Relative %: 89 %
Platelets: 429 10*3/uL — ABNORMAL HIGH (ref 150–400)
Platelets: 459 10*3/uL — ABNORMAL HIGH (ref 150–400)
RBC: 3.39 MIL/uL — ABNORMAL LOW (ref 3.87–5.11)
RBC: 3.25 MIL/uL — ABNORMAL LOW (ref 3.87–5.11)
RDW: 13.1 % (ref 11.5–15.5)
RDW: 13 % (ref 11.5–15.5)
WBC: 12.7 10*3/uL — ABNORMAL HIGH (ref 4.0–10.5)
WBC: 10 10*3/uL (ref 4.0–10.5)
nRBC: 0 % (ref 0.0–0.2)
nRBC: 0 % (ref 0.0–0.2)

## 2024-09-07 LAB — BASIC METABOLIC PANEL WITH GFR
Anion gap: 16 — ABNORMAL HIGH (ref 5–15)
BUN: 7 mg/dL — ABNORMAL LOW (ref 8–23)
CO2: 22 mmol/L (ref 22–32)
Calcium: 9.1 mg/dL (ref 8.9–10.3)
Chloride: 95 mmol/L — ABNORMAL LOW (ref 98–111)
Creatinine, Ser: 0.4 mg/dL — ABNORMAL LOW (ref 0.44–1.00)
GFR, Estimated: >60 mL/min
Glucose, Bld: 125 mg/dL — ABNORMAL HIGH (ref 70–99)
Potassium: 3.4 mmol/L — ABNORMAL LOW (ref 3.5–5.1)
Sodium: 133 mmol/L — ABNORMAL LOW (ref 135–145)

## 2024-09-07 LAB — TROPONIN T, HIGH SENSITIVITY: Troponin T High Sensitivity: 14 ng/L (ref 0–19)

## 2024-09-07 LAB — COMPREHENSIVE METABOLIC PANEL WITH GFR
ALT: 10 U/L (ref 0–44)
AST: 21 U/L (ref 15–41)
Albumin: 3.2 g/dL — ABNORMAL LOW (ref 3.5–5.0)
Alkaline Phosphatase: 124 U/L (ref 38–126)
Anion gap: 16 — ABNORMAL HIGH (ref 5–15)
BUN: 7 mg/dL — ABNORMAL LOW (ref 8–23)
CO2: 23 mmol/L (ref 22–32)
Calcium: 9.3 mg/dL (ref 8.9–10.3)
Chloride: 94 mmol/L — ABNORMAL LOW (ref 98–111)
Creatinine, Ser: 0.52 mg/dL (ref 0.44–1.00)
GFR, Estimated: >60 mL/min
Glucose, Bld: 141 mg/dL — ABNORMAL HIGH (ref 70–99)
Potassium: 3.6 mmol/L (ref 3.5–5.1)
Sodium: 133 mmol/L — ABNORMAL LOW (ref 135–145)
Total Bilirubin: 0.3 mg/dL (ref 0.0–1.2)
Total Protein: 7.8 g/dL (ref 6.5–8.1)

## 2024-09-07 LAB — LACTIC ACID, PLASMA: Lactic Acid, Venous: 1.1 mmol/L (ref 0.5–1.9)

## 2024-09-07 LAB — MAGNESIUM: Magnesium: 1.3 mg/dL — ABNORMAL LOW (ref 1.7–2.4)

## 2024-09-07 MED ORDER — DM-GUAIFENESIN ER 30-600 MG PO TB12: 1.0000 | ORAL_TABLET | Freq: Two times a day (BID) | ORAL | Status: AC | PRN

## 2024-09-07 MED ORDER — BUDESON-GLYCOPYRROL-FORMOTEROL 160-9-4.8 MCG/ACT IN AERO
2.0000 | INHALATION_SPRAY | Freq: Two times a day (BID) | RESPIRATORY_TRACT | Status: AC
  Filled 2024-09-07: qty 5.9

## 2024-09-07 MED ORDER — ZOLPIDEM TARTRATE 5 MG PO TABS: 2.5000 mg | ORAL_TABLET | Freq: Every evening | ORAL | Status: AC | PRN

## 2024-09-07 MED ORDER — ONDANSETRON HCL 4 MG/2ML IJ SOLN: 4.0000 mg | Freq: Three times a day (TID) | INTRAMUSCULAR | Status: AC | PRN

## 2024-09-07 MED ORDER — OXYCODONE-ACETAMINOPHEN 5-325 MG PO TABS
1.0000 | ORAL_TABLET | ORAL | Status: AC | PRN
  Administered 2024-09-07: 1 via ORAL
  Filled 2024-09-07: qty 1

## 2024-09-07 MED ORDER — GADOBUTROL 1 MMOL/ML IV SOLN
6.0000 mL | Freq: Once | INTRAVENOUS | Status: AC | PRN
  Administered 2024-09-07: 6 mL via INTRAVENOUS

## 2024-09-07 MED ORDER — METOPROLOL SUCCINATE ER 50 MG PO TB24: 50.0000 mg | ORAL_TABLET | Freq: Every day | ORAL | Status: AC

## 2024-09-07 MED ORDER — ENOXAPARIN SODIUM 40 MG/0.4ML IJ SOSY: 40.0000 mg | PREFILLED_SYRINGE | INTRAMUSCULAR | Status: AC

## 2024-09-07 MED ORDER — SODIUM CHLORIDE 0.9 % IV SOLN: INTRAVENOUS | Status: AC

## 2024-09-07 MED ORDER — DEXTROSE 5 % IV SOLN
10.0000 mg/kg | Freq: Three times a day (TID) | INTRAVENOUS | Status: AC
  Administered 2024-09-07: 600 mg via INTRAVENOUS
  Filled 2024-09-07 (×2): qty 12

## 2024-09-07 MED ORDER — POTASSIUM CHLORIDE CRYS ER 20 MEQ PO TBCR
40.0000 meq | EXTENDED_RELEASE_TABLET | Freq: Once | ORAL | Status: AC
  Administered 2024-09-07: 40 meq via ORAL
  Filled 2024-09-07: qty 2

## 2024-09-07 MED ORDER — METOPROLOL TARTRATE 5 MG/5ML IV SOLN: 5.0000 mg | INTRAVENOUS | Status: AC | PRN

## 2024-09-07 MED ORDER — OXYCODONE-ACETAMINOPHEN 5-325 MG PO TABS
1.0000 | ORAL_TABLET | Freq: Once | ORAL | Status: AC
  Administered 2024-09-07: 1 via ORAL
  Filled 2024-09-07: qty 1

## 2024-09-07 MED ORDER — HYDRALAZINE HCL 20 MG/ML IJ SOLN
10.0000 mg | INTRAMUSCULAR | Status: AC | PRN
  Administered 2024-09-07: 10 mg via INTRAVENOUS
  Filled 2024-09-07: qty 1

## 2024-09-07 MED ORDER — SODIUM CHLORIDE 0.9 % IV SOLN: INTRAVENOUS | Status: DC

## 2024-09-07 MED ORDER — ACETAMINOPHEN 325 MG PO TABS: 650.0000 mg | ORAL_TABLET | Freq: Four times a day (QID) | ORAL | Status: AC | PRN

## 2024-09-07 MED ORDER — SODIUM CHLORIDE 0.9 % IV BOLUS
1000.0000 mL | Freq: Once | INTRAVENOUS | Status: AC
  Administered 2024-09-07: 1000 mL via INTRAVENOUS

## 2024-09-07 MED ORDER — ALBUTEROL SULFATE (2.5 MG/3ML) 0.083% IN NEBU: 2.5000 mg | INHALATION_SOLUTION | RESPIRATORY_TRACT | Status: AC | PRN

## 2024-09-07 MED ORDER — ALPRAZOLAM 0.5 MG PO TABS
0.5000 mg | ORAL_TABLET | Freq: Every evening | ORAL | Status: AC | PRN
  Administered 2024-09-07: 0.5 mg via ORAL
  Filled 2024-09-07: qty 1

## 2024-09-07 MED ORDER — ALBUTEROL SULFATE HFA 108 (90 BASE) MCG/ACT IN AERS: 2.0000 | INHALATION_SPRAY | RESPIRATORY_TRACT | Status: DC | PRN

## 2024-09-07 MED ORDER — FLUORESCEIN SODIUM 1 MG OP STRP
1.0000 | ORAL_STRIP | Freq: Once | OPHTHALMIC | Status: AC
  Administered 2024-09-07: 1 via OPHTHALMIC
  Filled 2024-09-07: qty 1

## 2024-09-07 MED ORDER — TETRACAINE HCL 0.5 % OP SOLN
1.0000 [drp] | Freq: Once | OPHTHALMIC | Status: AC
  Administered 2024-09-07: 1 [drp] via OPHTHALMIC
  Filled 2024-09-07: qty 4

## 2024-09-07 NOTE — Progress Notes (Signed)
 69 year old lady with a history of lung cancer who is off treatment for a long time now.  Patient developed headaches and was found to have a right sylvian fissure enhancing lesion wrapped around the middle cerebral arteries.  We recommended a lumbar puncture with cytology which she had done and this showed that there is no malignant cells other than lymphocytes in the CSF.  In the interim her husband says that she is getting worse: She is having more headaches and becoming more sleepy.  I shared with him that I would like for her to have an MRI done and requested Stealth sequences in it.  I will have this done within the coming few days and I will see her back on Tuesday and make a plan.  In the absence of any regression, I would recommend a biopsy.  Patient's husband was very insistent about her eyelid which is swollen and I on multiple occasion had to explain to him that I do not treat eyelid problems and I recommended that they talk to Dr. Cleotilde her primary care doctor.  I even recommended that he goes to the emergency room but they declined that.  When her eyelid is lifted, she can see perfectly well from that eye she says.  I have connected with Dr. Melanee, her oncologist, whose team is going to hopefully see her as well at some point in the near future.

## 2024-09-07 NOTE — Progress Notes (Signed)
 "  Symptom Management Clinic [facility] at [facility] [phone] [phone]  Patient Care Team: Cleotilde Oneil FALCON, MD as PCP - General (Internal Medicine) Darron Deatrice LABOR, MD as PCP - Cardiology (Cardiology) Verdene Gills, RN as Oncology Nurse Navigator Isadora Hose, MD as Consulting Physician (Pulmonary Disease) Melanee Annah BROCKS, MD as Consulting Physician (Oncology) Lenn Aran, MD as Consulting Physician (Radiation Oncology)   NAME OF PATIENT: Katherine Perry  [id number]  18-May-1956   DATE OF VISIT: 09/07/24  REASON FOR CONSULT: Katherine Perry is a 69 y.o. female with multiple medical problems including history of stage III lung cancer currently in remission.   INTERVAL HISTORY: Patient was found to have a right sylvian fissure enhancing lesion and headaches, for which she was seen by Dr. Rosslyn earlier today.  Patient was sent from Dr. Catarino office to [facility] for evaluation of right eye swelling.  Patient states that she started having right eye/facial/scalp pain yesterday and has progressed to a red rash, pain, right eyelid swelling and blurred vision.   PAST MEDICAL HISTORY: Past Medical History:  Diagnosis Date   Anxiety    Asthma    Cancer (HCC)    Cough    Dyspnea    with exertion   GERD (gastroesophageal reflux disease)    Hypertension    Lung cancer (HCC)     PAST SURGICAL HISTORY:  Past Surgical History:  Procedure Laterality Date   BUNIONECTOMY Bilateral    COLONOSCOPY     COLONOSCOPY WITH PROPOFOL  N/A 07/30/2021   Procedure: COLONOSCOPY WITH PROPOFOL ;  Surgeon: Onita Elspeth Sharper, DO;  Location: [facility] ENDOSCOPY;  Service: Gastroenterology;  Laterality: N/A;   ESOPHAGOGASTRODUODENOSCOPY  04/01/2006   FLEXIBLE BRONCHOSCOPY Right 10/13/2022   Procedure: FLEXIBLE BRONCHOSCOPY;  Surgeon: Isadora Hose, MD;  Location: [facility] ORS;  Service: Pulmonary;  Laterality: Right;   PORTA CATH INSERTION N/A 12/22/2020    Procedure: PORTA CATH INSERTION;  Surgeon: Marea Selinda RAMAN, MD;  Location: [facility] INVASIVE CV LAB;  Service: Cardiovascular;  Laterality: N/A;   PORTA CATH REMOVAL N/A 05/31/2022   Procedure: PORTA CATH REMOVAL;  Surgeon: Marea Selinda RAMAN, MD;  Location: [facility] INVASIVE CV LAB;  Service: Cardiovascular;  Laterality: N/A;   THORACENTESIS     VIDEO BRONCHOSCOPY WITH ENDOBRONCHIAL NAVIGATION N/A 11/07/2020   Procedure: VIDEO BRONCHOSCOPY WITH ENDOBRONCHIAL NAVIGATION;  Surgeon: Parris Manna, MD;  Location: [facility] ORS;  Service: Thoracic;  Laterality: N/A;   VIDEO BRONCHOSCOPY WITH ENDOBRONCHIAL ULTRASOUND N/A 11/07/2020   Procedure: VIDEO BRONCHOSCOPY WITH ENDOBRONCHIAL ULTRASOUND;  Surgeon: Parris Manna, MD;  Location: [facility] ORS;  Service: Thoracic;  Laterality: N/A;   VIDEO BRONCHOSCOPY WITH ENDOBRONCHIAL ULTRASOUND Right 10/13/2022   Procedure: VIDEO BRONCHOSCOPY WITH ENDOBRONCHIAL ULTRASOUND;  Surgeon: Isadora Hose, MD;  Location: [facility] ORS;  Service: Pulmonary;  Laterality: Right;    HEMATOLOGY/ONCOLOGY HISTORY:  Oncology History  Malignant neoplasm of lower lobe of right lung (HCC)  11/20/2020 Cancer Staging   Staging form: Lung, AJCC 8th Edition - Clinical stage from 11/20/2020: Stage IIIA (cT3, cN1, cM0) - Signed by Melanee Annah BROCKS, MD on 11/23/2020   11/23/2020 Initial Diagnosis   Malignant neoplasm of lower lobe of right lung (HCC)   12/16/2020 - 02/04/2021 Chemotherapy         02/27/2021 - 02/26/2022 Chemotherapy   Patient is on Treatment Plan : LUNG Durvalumab  q14d       ALLERGIES:  is allergic to ace inhibitors.  MEDICATIONS:  Current Outpatient Medications  Medication Sig Dispense Refill  ALPRAZolam  (XANAX ) 0.5 MG tablet Take 0.5 mg by mouth at bedtime as needed for sleep.     aspirin  EC 81 MG tablet Take 1 tablet (81 mg total) by mouth daily. Swallow whole.     Cholecalciferol  50 MCG (2000 UT) CAPS Take 2,000 Units by mouth daily.     Cyanocobalamin  (B-12 PO) Take by mouth daily at  6 (six) AM.     esomeprazole (NEXIUM) 20 MG capsule Take 20 mg by mouth as needed.     Fluticasone -Umeclidin-Vilant (TRELEGY ELLIPTA ) 200-62.5-25 MCG/ACT AEPB Inhale 1 puff into the lungs daily. 30 each 11   metoprolol  succinate (TOPROL -XL) 50 MG 24 hr tablet Take 1.5 tablets (75 mg total) by mouth daily. 135 tablet 3   zolpidem  (AMBIEN ) 5 MG tablet Take 0.5 tablets by mouth at bedtime as needed.     acetaminophen  (TYLENOL ) 500 MG tablet Take 500 mg by mouth every 6 (six) hours as needed.     azelastine  (ASTELIN ) 0.1 % nasal spray Place 2 sprays into both nostrils 2 (two) times daily. Use in each nostril as directed 30 mL 2   folic acid  (FOLVITE ) 1 MG tablet TAKE 1 TABLET BY MOUTH EVERY DAY (Patient not taking: Reported on 09/07/2024) 90 tablet 3   montelukast  (SINGULAIR ) 10 MG tablet Take 1 tablet by mouth at bedtime. (Patient not taking: Reported on 09/07/2024)     spironolactone  (ALDACTONE ) 25 MG tablet Take 25 mg by mouth daily. (Patient not taking: Reported on 09/07/2024)     No current facility-administered medications for this visit.   Facility-Administered Medications Ordered in Other Visits  Medication Dose Route Frequency Provider Last Rate Last Admin   heparin  lock flush 100 UNIT/ML injection            heparin  lock flush 100 UNIT/ML injection            heparin  lock flush 100 UNIT/ML injection            heparin  lock flush 100 UNIT/ML injection            heparin  lock flush 100 unit/mL  500 Units Intravenous Once Rao, Archana C, MD       sodium chloride  flush (NS) 0.9 % injection 10 mL  10 mL Intravenous Once Rao, Archana C, MD       sodium chloride  flush (NS) 0.9 % injection 10 mL  10 mL Intravenous PRN Rao, Archana C, MD   10 mL at 01/26/21 0807   sodium chloride  flush (NS) 0.9 % injection 10 mL  10 mL Intravenous Once Melanee Annah BROCKS, MD        VITAL SIGNS: BP (!) 167/87 (BP Location: Left Arm, Patient Position: Sitting, Cuff Size: Normal)   Pulse (!) 125   Temp 97.7 F (36.5 C)  (Tympanic)   Resp 16   Wt 132 lb (59.9 kg)   SpO2 100%   BMI 22.66 kg/m  Filed Weights   09/07/24 1348  Weight: 132 lb (59.9 kg)    Estimated body mass index is 22.66 kg/m as calculated from the following:   Height as of an earlier encounter on 09/07/24: 5' 4 (1.626 m).   Weight as of this encounter: 132 lb (59.9 kg).  LABS: CBC:    Component Value Date/Time   WBC 12.7 (H) 09/07/2024 1334   HGB 10.1 (L) 09/07/2024 1334   HCT 31.1 (L) 09/07/2024 1334   PLT 429 (H) 09/07/2024 1334   MCV 91.7 09/07/2024 1334   NEUTROABS 11.4 (  H) 09/07/2024 1334   LYMPHSABS 0.6 (L) 09/07/2024 1334   MONOABS 0.6 09/07/2024 1334   EOSABS 0.0 09/07/2024 1334   BASOSABS 0.0 09/07/2024 1334   Comprehensive Metabolic Panel:    Component Value Date/Time   NA 133 (L) 09/07/2024 1334   K 3.6 09/07/2024 1334   CL 94 (L) 09/07/2024 1334   CO2 23 09/07/2024 1334   BUN 7 (L) 09/07/2024 1334   CREATININE 0.52 09/07/2024 1334   GLUCOSE 141 (H) 09/07/2024 1334   CALCIUM  9.3 09/07/2024 1334   AST 21 09/07/2024 1334   ALT 10 09/07/2024 1334   ALKPHOS 124 09/07/2024 1334   BILITOT 0.3 09/07/2024 1334   PROT 7.8 09/07/2024 1334   ALBUMIN 3.2 (L) 09/07/2024 1334    RADIOGRAPHIC STUDIES:   PERFORMANCE STATUS (ECOG) : 1 - Symptomatic but completely ambulatory  Review of Systems Unless otherwise noted, a complete review of systems is negative.  Physical Exam General: NAD HEENT: R. 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  Patient saw Dr. Janjua earlier today with plan for repeat MRI.  Rash -vesicular appearing rash concerning for zoster.  Discussed with Dr. Melanee.  Given blurred vision and concern  for ophthalmic involvement, patient was sent to the emergency department for further evaluation and management. Likely will need MRI, ophthalmology, and IV acyclovir /antibiotics.  Report called to ED triage.   Patient expressed understanding and was in agreement with this plan. She also understands that She can call clinic at any time with any questions, concerns, or complaints.   Thank you for allowing me to participate in the care of this very pleasant patient.   Time Total: 25 minutes  Visit consisted of counseling and education dealing with the complex and emotionally intense issues of symptom management in the setting of serious illness.Greater than 50%  of this time was spent counseling and coordinating care related to the above assessment and plan.  Signed by: Fonda Mower, PhD, NP-C     "

## 2024-09-07 NOTE — ED Notes (Signed)
 Visual Acuity:  R Eye: 20/50 L Eye: 20/50 Both Eyes: 20/40

## 2024-09-07 NOTE — ED Triage Notes (Signed)
 First nurse note: pt to ED from cancer center for possible shingles to right eye, sent for MRI

## 2024-09-07 NOTE — ED Provider Triage Note (Signed)
 Emergency Medicine Provider Triage Evaluation Note  Katherine Perry , a 69 y.o. female  was evaluated in triage.  Pt complains of vision changes, pain and concern for shingles involving the right eye.  Sent from the cancer center for potential MRI and antiviral medications..   Physical Exam  BP (!) 159/116 (BP Location: Left Arm)   Pulse (!) 134   Temp 97.7 F (36.5 C)   Resp 16   Wt 59.9 kg   SpO2 98%   BMI 22.66 kg/m  Gen:   Awake, no distress   Resp:  Normal effort  MSK:   Moves extremities without difficulty  Other:    Medical Decision Making  Medically screening exam initiated at 3:25 PM.  Appropriate orders placed.  Katherine Perry was informed that the remainder of the evaluation will be completed by another provider, this initial triage assessment does not replace that evaluation, and the importance of remaining in the ED until their evaluation is complete.  Labs obtained earlier today and are visible within the EMR.   Katherine Kirk NOVAK, FNP 09/07/24 1928

## 2024-09-07 NOTE — Progress Notes (Signed)
 Pharmacy Antibiotic Note  Katherine Perry is a 68 y.o. female admitted on 09/07/2024 with Zoster ophthalmicus.  Pharmacy has been consulted for Acyclovir  dosing.  Plan: Acyclovir  10mg /kg IV q8h NS at 125 ml/hr  Height: 5' 4 (162.6 cm) Weight: 59.8 kg (131 lb 13.4 oz) IBW/kg (Calculated) : 54.7  Temp (24hrs), Avg:97.9 F (36.6 C), Min:97.7 F (36.5 C), Max:98.2 F (36.8 C)  Recent Labs  Lab 09/07/24 1334 09/07/24 1729  WBC 12.7* 10.0  CREATININE 0.52 0.40*  LATICACIDVEN  --  1.1    Estimated Creatinine Clearance: 58.9 mL/min (A) (by C-G formula based on SCr of 0.4 mg/dL (L)).    Allergies[1]  Antimicrobials this admission: Acyclovir  2/6 >>    Dose adjustments this admission:  Microbiology results:  Thank you for allowing pharmacy to be a part of this patients care.  Olam Fritter, PharmD, BCPS 09/07/2024 8:03 PM      [1]  Allergies Allergen Reactions   Ace Inhibitors Cough

## 2024-09-07 NOTE — ED Notes (Signed)
 See triage note  Presents with family from  Cancer center  Right eye red and swollen    Having pain with slight rash

## 2024-09-07 NOTE — ED Notes (Signed)
 Pt to MRI

## 2024-09-07 NOTE — H&P (Incomplete)
 " History and Physical    Katherine Perry FMW:[id number] DOB: 04/01/1956 DOA: 09/07/2024  Referring MD/NP/PA:   PCP: Cleotilde Oneil FALCON, MD   Patient coming from:  The patient is coming from home.     Chief Complaint: eyelid rash, swelling  HPI: Katherine Perry is a 69 y.o. female with medical history significant of      Data reviewed independently and ED Course: pt was found to have     ***       EKG: I have personally reviewed.  Not done in ED, will get one.   ***   Review of Systems:   General: no fevers, chills, no body weight gain, has poor appetite, has fatigue HEENT: no blurry vision, hearing changes or sore throat Respiratory: no dyspnea, coughing, wheezing CV: no chest pain, no palpitations GI: no nausea, vomiting, abdominal pain, diarrhea, constipation GU: no dysuria, burning on urination, increased urinary frequency, hematuria  Ext: no leg edema Neuro: no unilateral weakness, numbness, or tingling, no vision change or hearing loss Skin: no rash, no skin tear. MSK: No muscle spasm, no deformity, no limitation of range of movement in spin Heme: No easy bruising.  Travel history: No recent long distant travel.   Allergy: Allergies[1]  Past Medical History:  Diagnosis Date   Anxiety    Asthma    Cancer (HCC)    Cough    Dyspnea    with exertion   GERD (gastroesophageal reflux disease)    Hypertension    Lung cancer [facility])     Past Surgical History:  Procedure Laterality Date   BUNIONECTOMY Bilateral    COLONOSCOPY     COLONOSCOPY WITH PROPOFOL  N/A 07/30/2021   Procedure: COLONOSCOPY WITH PROPOFOL ;  Surgeon: Onita Elspeth Sharper, DO;  Location: [facility] ENDOSCOPY;  Service: Gastroenterology;  Laterality: N/A;   ESOPHAGOGASTRODUODENOSCOPY  04/01/2006   FLEXIBLE BRONCHOSCOPY Right 10/13/2022   Procedure: FLEXIBLE BRONCHOSCOPY;  Surgeon: Isadora Hose, MD;  Location: [facility] ORS;  Service: Pulmonary;  Laterality: Right;   PORTA CATH INSERTION N/A 12/22/2020    Procedure: PORTA CATH INSERTION;  Surgeon: Marea Selinda RAMAN, MD;  Location: [facility] INVASIVE CV LAB;  Service: Cardiovascular;  Laterality: N/A;   PORTA CATH REMOVAL N/A 05/31/2022   Procedure: PORTA CATH REMOVAL;  Surgeon: Marea Selinda RAMAN, MD;  Location: [facility] INVASIVE CV LAB;  Service: Cardiovascular;  Laterality: N/A;   THORACENTESIS     VIDEO BRONCHOSCOPY WITH ENDOBRONCHIAL NAVIGATION N/A 11/07/2020   Procedure: VIDEO BRONCHOSCOPY WITH ENDOBRONCHIAL NAVIGATION;  Surgeon: Parris Manna, MD;  Location: [facility] ORS;  Service: Thoracic;  Laterality: N/A;   VIDEO BRONCHOSCOPY WITH ENDOBRONCHIAL ULTRASOUND N/A 11/07/2020   Procedure: VIDEO BRONCHOSCOPY WITH ENDOBRONCHIAL ULTRASOUND;  Surgeon: Parris Manna, MD;  Location: [facility] ORS;  Service: Thoracic;  Laterality: N/A;   VIDEO BRONCHOSCOPY WITH ENDOBRONCHIAL ULTRASOUND Right 10/13/2022   Procedure: VIDEO BRONCHOSCOPY WITH ENDOBRONCHIAL ULTRASOUND;  Surgeon: Isadora Hose, MD;  Location: [facility] ORS;  Service: Pulmonary;  Laterality: Right;    Social History:  reports that she quit smoking about 29 years ago. Her smoking use included cigarettes. She started smoking about 56 years ago. She has a 40.2 pack-year smoking history. She has never used smokeless tobacco. She reports current alcohol use. She reports that she does not use drugs.  Family History:  Family History  Problem Relation Age of Onset   Heart attack Father    Heart disease Father    Heart disease Sister    Breast cancer Neg Hx  Prior to Admission medications  Medication Sig Start Date End Date Taking? Authorizing Provider  acetaminophen  (TYLENOL ) 500 MG tablet Take 500 mg by mouth every 6 (six) hours as needed.    [provider]  ALPRAZolam  (XANAX ) 0.5 MG tablet Take 0.5 mg by mouth at bedtime as needed for sleep. 10/18/20   [provider]  aspirin  EC 81 MG tablet Take 1 tablet (81 mg total) by mouth daily. Swallow whole. 04/14/23   Darron Deatrice LABOR, MD  azelastine   (ASTELIN ) 0.1 % nasal spray Place 2 sprays into both nostrils 2 (two) times daily. Use in each nostril as directed 05/08/21   Dasie Tinnie MATSU, NP  Cholecalciferol  50 MCG (2000 UT) CAPS Take 2,000 Units by mouth daily.    [provider]  Cyanocobalamin  (B-12 PO) Take by mouth daily at 6 (six) AM.    [provider]  esomeprazole (NEXIUM) 20 MG capsule Take 20 mg by mouth as needed.    [provider]  Fluticasone -Umeclidin-Vilant (TRELEGY ELLIPTA ) 200-62.5-25 MCG/ACT AEPB Inhale 1 puff into the lungs daily. 06/25/24   Isadora Hose, MD  folic acid  (FOLVITE ) 1 MG tablet TAKE 1 TABLET BY MOUTH EVERY DAY Patient not taking: Reported on 09/07/2024 05/16/23   Melanee Annah BROCKS, MD  metoprolol  succinate (TOPROL -XL) 50 MG 24 hr tablet Take 1.5 tablets (75 mg total) by mouth daily. 02/14/24   Furth, Cadence H, PA-C  montelukast  (SINGULAIR ) 10 MG tablet Take 1 tablet by mouth at bedtime. Patient not taking: Reported on 09/07/2024 11/03/21 02/14/24  [provider]  spironolactone  (ALDACTONE ) 25 MG tablet Take 25 mg by mouth daily. Patient not taking: Reported on 09/07/2024 08/14/22   [provider]  zolpidem  (AMBIEN ) 5 MG tablet Take 0.5 tablets by mouth at bedtime as needed. 05/31/22 09/07/24  [provider]    Physical Exam: Vitals:   09/07/24 1518 09/07/24 1626 09/07/24 1820 09/07/24 2130  BP:   (!) 198/98 (!) 199/97  Pulse:   (!) 130 (!) 118  Resp:   16 (!) 21  Temp:      SpO2:   100% 100%  Weight: 59.9 kg 59.8 kg    Height:  5' 4 (1.626 m)     General: Not in acute distress HEENT:       Eyes: PERRL, EOMI, no jaundice       ENT: No discharge from the ears and nose, no pharynx injection, no tonsillar enlargement.        Neck: No JVD, no bruit, no mass felt. Heme: No neck lymph node enlargement. Cardiac: S1/S2, RRR, No murmurs, No gallops or rubs. Respiratory: No rales, wheezing, rhonchi or rubs. GI: Soft, nondistended, nontender, no rebound pain,  no organomegaly, BS present. GU: No hematuria Ext: No pitting leg edema bilaterally. 1+DP/PT pulse bilaterally. Musculoskeletal: No joint deformities, No joint redness or warmth, no limitation of ROM in spin. Skin: No rashes.  Neuro: Alert, oriented X3, cranial nerves II-XII grossly intact, moves all extremities normally. Muscle strength 5/5 in all extremities, sensation to light touch intact. Brachial reflex 2+ bilaterally. Knee reflex 1+ bilaterally. Negative Babinski's sign. Normal finger to nose test. Psych: Patient is not psychotic, no suicidal or hemocidal ideation.  Labs on Admission: I have personally reviewed following labs and imaging studies  CBC: Recent Labs  Lab 09/07/24 1334 09/07/24 1729  WBC 12.7* 10.0  NEUTROABS 11.4* 8.9*  HGB 10.1* 9.7*  HCT 31.1* 29.8*  MCV 91.7 91.7  PLT 429* 459*   Basic  Metabolic Panel: Recent Labs  Lab 09/07/24 1334 09/07/24 1729  NA 133* 133*  K 3.6 3.4*  CL 94* 95*  CO2 23 22  GLUCOSE 141* 125*  BUN 7* 7*  CREATININE 0.52 0.40*  CALCIUM  9.3 9.1   GFR: Estimated Creatinine Clearance: 58.9 mL/min (A) (by C-G formula based on SCr of 0.4 mg/dL (L)). Liver Function Tests: Recent Labs  Lab 09/07/24 1334  AST 21  ALT 10  ALKPHOS 124  BILITOT 0.3  PROT 7.8  ALBUMIN 3.2*   No results for input(s): LIPASE, AMYLASE in the last 168 hours. No results for input(s): AMMONIA in the last 168 hours. Coagulation Profile: No results for input(s): INR, PROTIME in the last 168 hours. Cardiac Enzymes: No results for input(s): CKTOTAL, CKMB, CKMBINDEX, TROPONINI in the last 168 hours. BNP (last 3 results) No results for input(s): PROBNP in the last 8760 hours. HbA1C: No results for input(s): HGBA1C in the last 72 hours. CBG: No results for input(s): GLUCAP in the last 168 hours. Lipid Profile: No results for input(s): CHOL, HDL, LDLCALC, TRIG, CHOLHDL, LDLDIRECT in the last 72 hours. Thyroid  Function  Tests: No results for input(s): TSH, T4TOTAL, FREET4, T3FREE, THYROIDAB in the last 72 hours. Anemia Panel: No results for input(s): VITAMINB12, FOLATE, FERRITIN, TIBC, IRON, RETICCTPCT in the last 72 hours. Urine analysis: No results found for: COLORURINE, APPEARANCEUR, LABSPEC, PHURINE, GLUCOSEU, HGBUR, BILIRUBINUR, KETONESUR, PROTEINUR, UROBILINOGEN, NITRITE, LEUKOCYTESUR Sepsis Labs: @LABRCNTIP (procalcitonin:4,lacticidven:4) )No results found for this or any previous visit (from the past 240 hours).   Radiological Exams on Admission:   Assessment/Plan Principal Problem:   Zoster ophthalmicus Active Problems:   PAF (paroxysmal atrial fibrillation) (HCC)   Brain mass   Asthma   HTN (hypertension)   Anxiety   Malignant neoplasm of lower lobe of right lung (HCC)   Assessment and Plan:  Principal Problem:   Zoster ophthalmicus Active Problems:   PAF (paroxysmal atrial fibrillation) (HCC)   Brain mass   Asthma   HTN (hypertension)   Anxiety   Malignant neoplasm of lower lobe of right lung (HCC)    DVT ppx: SQ Lovenox   Code Status: Full code   ***  Family Communication:     not done, no family member is at bed side.              Yes, patient's    at bed side.       by phone   ***  Disposition Plan:  Anticipate discharge back to previous environment  Consults called:    Admission status and Level of care: Progressive:    for obs as inpt        Dispo: The patient is from: {From:23814}              Anticipated d/c is to: {To:23815}              Anticipated d/c date is: {Days:23816}              Patient currently {Medically stable:23817}    Severity of Illness:  {Observation/Inpatient:21159}       Date of Service 09/07/2024    Caleb Exon [facility]   If 7PM-7AM, please contact night-coverage [email or web address] 09/07/2024, 10:51 PM     [1]  Allergies Allergen Reactions   Ace Inhibitors Cough   "

## 2024-09-07 NOTE — ED Triage Notes (Signed)
 PT was sent from cancer doctor for MRI due to shingles causing vision issues. X 2 days.

## 2024-09-07 NOTE — ED Provider Notes (Signed)
 "  [facility] Provider Note    Event Date/Time   First MD Initiated Contact with Patient 09/07/24 [phone]     (approximate)   History   Eye Problem   HPI  Katherine Perry is a 69 y.o. 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  The patient and husband states that she has had increased headaches and become increasingly tired and sleepy over the last couple of weeks.  Over the last week she has developed right facial pain and then right eye pain as well as redness and swelling of the right eyelid over the last several days.  She denies blurred vision when her eyes open fully although states that she cannot see well normally due to the eyelid swelling.  She denies any fever or chills.  She has no chest pain or palpitations.  She does not feel dizzy or lightheaded.  She was seen by neurosurgery today and scheduled for an MRI, and then sent in from oncology for possible zoster ophthalmicus.  I reviewed the past medical records.  The patient was seen by neurosurgery today for worsening symptoms with increasing headaches and sleepiness.  She then went to the symptom management clinic due to eyelid swelling and was diagnosed with likely ocular shingles and sent to the ED for further evaluation.   Physical Exam   Triage Vital Signs: ED Triage Vitals  Encounter Vitals Group     BP 09/07/24 1516 (!) 159/116     Girls Systolic BP Percentile --      Girls Diastolic BP Percentile --      Boys Systolic BP Percentile --      Boys Diastolic BP Percentile --      Pulse Rate 09/07/24 1516 (!) 134     Resp 09/07/24 1516 16     Temp 09/07/24 1516 97.7 F (36.5 C)     Temp src --      SpO2 09/07/24 1516 98 %     Weight 09/07/24 1518 132 lb (59.9 kg)     Height 09/07/24 1626 5' 4 (1.626 m)     Head Circumference --       Peak Flow --      Pain Score 09/07/24 1517 9     Pain Loc --      Pain Education --      Exclude from Growth Chart --     Most recent vital signs: Vitals:   09/07/24 2255 09/07/24 2341  BP: (!) 183/89 (!) 197/96  Pulse: (!) 125 (!) 118  Resp: (!) 21 20  Temp:  97.6 F (36.4 C)  SpO2: 98% 99%     General: Awake, no distress.  CV:  Good peripheral perfusion.  Resp:  Normal effort.  Abd:  No distention.  Other:  EOMI.  PERRLA.  No photophobia.  No conjunctival injection.  Right eyelid swelling.  Erythema with faint vesicular rash to the right eyelid and right periorbital area.  Normal speech.  No facial droop.  Motor intact in all extremities.  No ataxia on finger to nose.   ED Results / Procedures / Treatments   Labs (all labs ordered are listed, but only abnormal results are displayed) Labs Reviewed  BASIC METABOLIC PANEL WITH GFR - Abnormal; Notable for the following components:      Result Value   Sodium  133 (*)    Potassium 3.4 (*)    Chloride 95 (*)    Glucose, Bld 125 (*)    BUN 7 (*)    Creatinine, Ser 0.40 (*)    Anion gap 16 (*)    All other components within normal limits  CBC WITH DIFFERENTIAL/PLATELET - Abnormal; Notable for the following components:   RBC 3.25 (*)    Hemoglobin 9.7 (*)    HCT 29.8 (*)    Platelets 459 (*)    Neutro Abs 8.9 (*)    Lymphs Abs 0.5 (*)    Abs Immature Granulocytes 0.08 (*)    All other components within normal limits  MAGNESIUM  - Abnormal; Notable for the following components:   Magnesium  1.3 (*)    All other components within normal limits  LACTIC ACID, PLASMA  PHOSPHORUS  HIV ANTIBODY (ROUTINE TESTING W REFLEX)  BASIC METABOLIC PANEL WITH GFR  CBC  TROPONIN T, HIGH SENSITIVITY     EKG  ED ECG REPORT I, Waylon Cassis, the attending physician, personally viewed and interpreted this ECG.  Date: 09/07/2024 EKG Time: 1522 Rate: 127 Rhythm: Sinus tachycardia QRS Axis: normal Intervals: normal ST/T Wave  abnormalities: Nonspecific ST abnormalities Narrative Interpretation: no evidence of acute ischemia    RADIOLOGY  MRI brain:   IMPRESSION:  1. 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  2. No new acute abnormality.  3. Right mastoid effusion.    PROCEDURES:  Critical Care performed: No  Procedures   MEDICATIONS ORDERED IN ED: Medications  acyclovir  (ZOVIRAX ) 600 mg in dextrose  5 % 100 mL IVPB (0 mg Intravenous Stopped 09/07/24 2228)  0.9 %  sodium chloride  infusion ( Intravenous New Bag/Given 09/07/24 2231)  metoprolol  tartrate (LOPRESSOR ) injection 5 mg (has no administration in time range)  ondansetron  (ZOFRAN ) injection 4 mg (has no administration in time range)  hydrALAZINE  (APRESOLINE ) injection 10 mg (10 mg Intravenous Given 09/07/24 2339)  acetaminophen  (TYLENOL ) tablet 650 mg (has no administration in time range)  dextromethorphan-guaiFENesin  (MUCINEX  DM) 30-600 MG per 12 hr tablet 1 tablet (has no administration in time range)  enoxaparin  (LOVENOX ) injection 40 mg (has no administration in time range)  albuterol  (PROVENTIL ) (2.5 MG/3ML) 0.083% nebulizer solution 2.5 mg (has no administration in time range)  oxyCODONE -acetaminophen  (PERCOCET/ROXICET) 5-325 MG per tablet 1 tablet (1 tablet Oral Given 09/07/24 2349)  metoprolol  succinate (TOPROL -XL) 24 hr tablet 50 mg (has no administration in time range)  ALPRAZolam  (XANAX ) tablet 0.5 mg (0.5 mg Oral Given 09/07/24 2349)  zolpidem  (AMBIEN ) tablet 2.5 mg (has no administration in time range)  budesonide -glycopyrrolate -formoterol  (BREZTRI ) 160-9-4.8 MCG/ACT inhaler 2 puff (has no administration in time range)  tetracaine  (PONTOCAINE) 0.5 % ophthalmic solution 1 drop (1 drop Right Eye Given 09/07/24 1713)  fluorescein  ophthalmic strip 1 strip (1 strip Right Eye Given 09/07/24 1713)  sodium chloride  0.9 % bolus 1,000 mL (0 mLs Intravenous Stopped 09/07/24 2256)   oxyCODONE -acetaminophen  (PERCOCET/ROXICET) 5-325 MG per tablet 1 tablet (1 tablet Oral Given 09/07/24 2003)  gadobutrol  (GADAVIST ) 1 MMOL/ML injection 6 mL (6 mLs Intravenous Contrast Given 09/07/24 2118)  potassium chloride  SA (KLOR-CON  M) CR tablet 40 mEq (40 mEq Oral Given 09/07/24 2339)     IMPRESSION / MDM / ASSESSMENT AND PLAN / ED COURSE  I reviewed the triage vital signs and the nursing notes.  69 year old female with PMH as noted above presents referred from oncology for right eye pain and eyelid swelling, with concern  for zoster ophthalmicus, as well as increased sleepiness and headaches and concern for worsening of the brain lesion.  On exam the patient is hypertensive and quite tachycardic.  Other vital signs are normal.  She is alert and oriented with a nonfocal neurologic exam, and findings on exam of the right eye compatible with zoster ophthalmicus.  Differential diagnosis includes, but is not limited to, zoster ophthalmicus, periorbital cellulitis, acute infection/sepsis, worsening brain lesion.  We will obtain the MRI that was scheduled for early next week as well as lab workup.  I will consult ophthalmology, however I anticipate that given the patient's comorbidities and other findings she may require admission and IV antivirals.  Patient's presentation is most consistent with acute presentation with potential threat to life or bodily function.  The patient is on the cardiac monitor to evaluate for evidence of arrhythmia and/or significant heart rate changes.  ----------------------------------------- 6:00 PM on 09/07/2024 -----------------------------------------  I performed a fluorescein  exam which showed no uptake or any dendrites.  I consulted and discussed the case with Dr. Mittie from ophthalmology.  He advised that if there was no uptake on fluorescein , a normal ocular pressure, normal visual acuity, and no redness to the eye or clinical evidence of iritis/keratitis  (which there is not) and there is no indication at this time for steroids.  He recommends oral or IV antivirals.  Visual acuity is equal bilaterally.  Ocular pressures were 17 and 20.  Therefore there is no indication for steroids at this time.  ----------------------------------------- 10:41 PM on 09/07/2024 -----------------------------------------   MRI shows unchanged mass.  There is no indication for emergent neurosurgery consult.  The patient remains tachycardic.  Lab workup is overall unremarkable.  She has no leukocytosis and the lactate is normal.  I consulted Dr. Hilma from the hospitalist service; based on our discussion he agrees to evaluate the patient for admission.   FINAL CLINICAL IMPRESSION(S) / ED DIAGNOSES   Final diagnoses:  Herpes zoster ophthalmicus of right eye  Sinus tachycardia  Brain mass  Weakness     Rx / DC Orders   ED Discharge Orders     None        Note:  This document was prepared using Dragon voice recognition software and may include unintentional dictation errors.    Jacolyn Pae, MD 09/07/24 2352  "

## 2024-09-07 NOTE — Telephone Encounter (Addendum)
 Husband called to discuss patient's symptoms.  She is having increased headaches on the right side where her brain tumor.   Her husband is concerned about her. He says that she is lethargic and sleeping a lot. He is drinking fluids but decreased appetite.   He wants to talk to doctor Janjua today and does not want to wait until Tuesday. I added them on for a same day appointment.

## 2024-09-11 ENCOUNTER — Other Ambulatory Visit

## 2024-09-11 ENCOUNTER — Ambulatory Visit: Admitting: Neurosurgery

## 2025-02-11 ENCOUNTER — Ambulatory Visit

## 2025-02-18 ENCOUNTER — Ambulatory Visit

## 2025-02-19 ENCOUNTER — Ambulatory Visit

## 2025-02-25 ENCOUNTER — Inpatient Hospital Stay: Admitting: Oncology

## 2025-03-04 ENCOUNTER — Inpatient Hospital Stay: Admitting: Oncology
